# Patient Record
Sex: Male | Born: 1935 | Race: White | Hispanic: No | Smoking: Former smoker
Health system: Southern US, Community
[De-identification: ages and names within clinical notes are randomized; demographics above are authoritative.]

## PROBLEM LIST (undated history)

## (undated) DIAGNOSIS — B019 Varicella without complication: Secondary | ICD-10-CM

## (undated) DIAGNOSIS — E785 Hyperlipidemia, unspecified: Secondary | ICD-10-CM

## (undated) DIAGNOSIS — I350 Nonrheumatic aortic (valve) stenosis: Secondary | ICD-10-CM

## (undated) DIAGNOSIS — G479 Sleep disorder, unspecified: Secondary | ICD-10-CM

## (undated) DIAGNOSIS — E079 Disorder of thyroid, unspecified: Secondary | ICD-10-CM

## (undated) DIAGNOSIS — C4491 Basal cell carcinoma of skin, unspecified: Secondary | ICD-10-CM

## (undated) DIAGNOSIS — J189 Pneumonia, unspecified organism: Secondary | ICD-10-CM

## (undated) DIAGNOSIS — I639 Cerebral infarction, unspecified: Secondary | ICD-10-CM

## (undated) DIAGNOSIS — B269 Mumps without complication: Secondary | ICD-10-CM

## (undated) DIAGNOSIS — I38 Endocarditis, valve unspecified: Secondary | ICD-10-CM

## (undated) DIAGNOSIS — T8209XA Other mechanical complication of heart valve prosthesis, initial encounter: Secondary | ICD-10-CM

## (undated) DIAGNOSIS — A809 Acute poliomyelitis, unspecified: Secondary | ICD-10-CM

## (undated) DIAGNOSIS — N400 Enlarged prostate without lower urinary tract symptoms: Secondary | ICD-10-CM

## (undated) DIAGNOSIS — F32A Depression, unspecified: Secondary | ICD-10-CM

## (undated) DIAGNOSIS — Z952 Presence of prosthetic heart valve: Secondary | ICD-10-CM

## (undated) DIAGNOSIS — K5790 Diverticulosis of intestine, part unspecified, without perforation or abscess without bleeding: Secondary | ICD-10-CM

## (undated) DIAGNOSIS — M1990 Unspecified osteoarthritis, unspecified site: Secondary | ICD-10-CM

## (undated) DIAGNOSIS — I771 Stricture of artery: Secondary | ICD-10-CM

## (undated) DIAGNOSIS — I351 Nonrheumatic aortic (valve) insufficiency: Secondary | ICD-10-CM

## (undated) DIAGNOSIS — B059 Measles without complication: Secondary | ICD-10-CM

## (undated) DIAGNOSIS — F329 Major depressive disorder, single episode, unspecified: Secondary | ICD-10-CM

## (undated) DIAGNOSIS — I714 Abdominal aortic aneurysm, without rupture, unspecified: Secondary | ICD-10-CM

## (undated) DIAGNOSIS — I10 Essential (primary) hypertension: Secondary | ICD-10-CM

## (undated) DIAGNOSIS — Z8612 Personal history of poliomyelitis: Secondary | ICD-10-CM

## (undated) DIAGNOSIS — R0602 Shortness of breath: Secondary | ICD-10-CM

## (undated) DIAGNOSIS — I2510 Atherosclerotic heart disease of native coronary artery without angina pectoris: Secondary | ICD-10-CM

## (undated) DIAGNOSIS — I219 Acute myocardial infarction, unspecified: Secondary | ICD-10-CM

## (undated) HISTORY — PX: TONSILLECTOMY: SUR1361

## (undated) HISTORY — PX: CORONARY ANGIOPLASTY: SHX604

## (undated) HISTORY — PX: JOINT REPLACEMENT: SHX530

## (undated) HISTORY — DX: Mumps without complication: B26.9

## (undated) HISTORY — DX: Basal cell carcinoma of skin, unspecified: C44.91

## (undated) HISTORY — PX: APPENDECTOMY: SHX54

## (undated) HISTORY — DX: Nonrheumatic aortic (valve) stenosis: I35.0

## (undated) HISTORY — DX: Other mechanical complication of heart valve prosthesis, initial encounter: T82.09XA

## (undated) HISTORY — DX: Abdominal aortic aneurysm, without rupture, unspecified: I71.40

## (undated) HISTORY — DX: Benign prostatic hyperplasia without lower urinary tract symptoms: N40.0

## (undated) HISTORY — DX: Sleep disorder, unspecified: G47.9

## (undated) HISTORY — DX: Measles without complication: B05.9

## (undated) HISTORY — DX: Cerebral infarction, unspecified: I63.9

## (undated) HISTORY — DX: Atherosclerotic heart disease of native coronary artery without angina pectoris: I25.10

## (undated) HISTORY — DX: Personal history of poliomyelitis: Z86.12

## (undated) HISTORY — PX: CORONARY ARTERY BYPASS GRAFT: SHX141

## (undated) HISTORY — DX: Hyperlipidemia, unspecified: E78.5

## (undated) HISTORY — DX: Diverticulosis of intestine, part unspecified, without perforation or abscess without bleeding: K57.90

## (undated) HISTORY — DX: Nonrheumatic aortic (valve) insufficiency: I35.1

## (undated) HISTORY — PX: WISDOM TOOTH EXTRACTION: SHX21

## (undated) HISTORY — DX: Stricture of artery: I77.1

## (undated) HISTORY — PX: CARDIAC CATHETERIZATION: SHX172

## (undated) HISTORY — DX: Varicella without complication: B01.9

## (undated) HISTORY — DX: Abdominal aortic aneurysm, without rupture: I71.4

## (undated) HISTORY — DX: Endocarditis, valve unspecified: I38

## (undated) HISTORY — DX: Acute poliomyelitis, unspecified: A80.9

## (undated) HISTORY — PX: CORONARY STENT PLACEMENT: SHX1402

## (undated) HISTORY — DX: Essential (primary) hypertension: I10

## (undated) HISTORY — DX: Disorder of thyroid, unspecified: E07.9

---

## 1997-11-01 ENCOUNTER — Other Ambulatory Visit: Admission: RE | Admit: 1997-11-01 | Discharge: 1997-11-01 | Payer: Self-pay | Admitting: Cardiology

## 1998-02-07 ENCOUNTER — Other Ambulatory Visit: Admission: RE | Admit: 1998-02-07 | Discharge: 1998-02-07 | Payer: Self-pay | Admitting: Cardiology

## 1998-11-17 ENCOUNTER — Ambulatory Visit (HOSPITAL_COMMUNITY): Admission: RE | Admit: 1998-11-17 | Discharge: 1998-11-17 | Payer: Self-pay | Admitting: Cardiology

## 2001-07-23 HISTORY — PX: AORTIC VALVE REPLACEMENT: SHX41

## 2001-11-27 ENCOUNTER — Emergency Department (HOSPITAL_COMMUNITY): Admission: EM | Admit: 2001-11-27 | Discharge: 2001-11-27 | Payer: Self-pay | Admitting: Emergency Medicine

## 2002-01-06 ENCOUNTER — Ambulatory Visit (HOSPITAL_COMMUNITY): Admission: RE | Admit: 2002-01-06 | Discharge: 2002-01-06 | Payer: Self-pay | Admitting: Cardiology

## 2002-01-19 ENCOUNTER — Encounter: Payer: Self-pay | Admitting: Surgery

## 2002-01-21 ENCOUNTER — Encounter: Payer: Self-pay | Admitting: Surgery

## 2002-01-21 ENCOUNTER — Inpatient Hospital Stay (HOSPITAL_COMMUNITY): Admission: RE | Admit: 2002-01-21 | Discharge: 2002-01-29 | Payer: Self-pay | Admitting: Surgery

## 2002-01-21 ENCOUNTER — Encounter (INDEPENDENT_AMBULATORY_CARE_PROVIDER_SITE_OTHER): Payer: Self-pay | Admitting: *Deleted

## 2002-01-22 ENCOUNTER — Encounter: Payer: Self-pay | Admitting: Surgery

## 2002-01-23 ENCOUNTER — Encounter: Payer: Self-pay | Admitting: Surgery

## 2002-03-03 ENCOUNTER — Encounter (HOSPITAL_COMMUNITY): Admission: RE | Admit: 2002-03-03 | Discharge: 2002-06-01 | Payer: Self-pay | Admitting: Cardiology

## 2002-07-23 HISTORY — PX: ABDOMINAL AORTIC ANEURYSM REPAIR: SUR1152

## 2003-03-10 ENCOUNTER — Emergency Department (HOSPITAL_COMMUNITY): Admission: EM | Admit: 2003-03-10 | Discharge: 2003-03-10 | Payer: Self-pay | Admitting: Emergency Medicine

## 2003-03-10 ENCOUNTER — Encounter: Payer: Self-pay | Admitting: Emergency Medicine

## 2003-03-11 ENCOUNTER — Encounter: Payer: Self-pay | Admitting: *Deleted

## 2003-03-12 ENCOUNTER — Ambulatory Visit (HOSPITAL_COMMUNITY): Admission: RE | Admit: 2003-03-12 | Discharge: 2003-03-12 | Payer: Self-pay | Admitting: *Deleted

## 2003-03-31 ENCOUNTER — Inpatient Hospital Stay (HOSPITAL_COMMUNITY): Admission: RE | Admit: 2003-03-31 | Discharge: 2003-04-02 | Payer: Self-pay | Admitting: *Deleted

## 2003-03-31 ENCOUNTER — Encounter: Payer: Self-pay | Admitting: *Deleted

## 2003-05-03 ENCOUNTER — Encounter: Admission: RE | Admit: 2003-05-03 | Discharge: 2003-05-03 | Payer: Self-pay | Admitting: *Deleted

## 2003-05-03 ENCOUNTER — Encounter: Payer: Self-pay | Admitting: *Deleted

## 2003-11-08 ENCOUNTER — Encounter: Admission: RE | Admit: 2003-11-08 | Discharge: 2003-11-08 | Payer: Self-pay | Admitting: *Deleted

## 2004-05-15 ENCOUNTER — Encounter: Admission: RE | Admit: 2004-05-15 | Discharge: 2004-05-15 | Payer: Self-pay | Admitting: *Deleted

## 2004-11-06 ENCOUNTER — Encounter: Admission: RE | Admit: 2004-11-06 | Discharge: 2004-11-06 | Payer: Self-pay | Admitting: *Deleted

## 2005-05-14 ENCOUNTER — Encounter: Admission: RE | Admit: 2005-05-14 | Discharge: 2005-05-14 | Payer: Self-pay | Admitting: *Deleted

## 2005-05-21 ENCOUNTER — Ambulatory Visit (HOSPITAL_COMMUNITY): Admission: RE | Admit: 2005-05-21 | Discharge: 2005-05-21 | Payer: Self-pay | Admitting: Gastroenterology

## 2006-03-28 ENCOUNTER — Ambulatory Visit (HOSPITAL_COMMUNITY): Admission: RE | Admit: 2006-03-28 | Discharge: 2006-03-28 | Payer: Self-pay | Admitting: Cardiology

## 2006-03-28 ENCOUNTER — Encounter: Payer: Self-pay | Admitting: Vascular Surgery

## 2006-05-16 ENCOUNTER — Encounter: Admission: RE | Admit: 2006-05-16 | Discharge: 2006-05-16 | Payer: Self-pay | Admitting: *Deleted

## 2008-01-15 ENCOUNTER — Ambulatory Visit: Payer: Self-pay | Admitting: *Deleted

## 2008-01-15 ENCOUNTER — Encounter: Admission: RE | Admit: 2008-01-15 | Discharge: 2008-01-15 | Payer: Self-pay | Admitting: *Deleted

## 2008-06-22 DIAGNOSIS — I639 Cerebral infarction, unspecified: Secondary | ICD-10-CM

## 2008-06-22 HISTORY — DX: Cerebral infarction, unspecified: I63.9

## 2008-07-02 ENCOUNTER — Inpatient Hospital Stay (HOSPITAL_COMMUNITY): Admission: EM | Admit: 2008-07-02 | Discharge: 2008-07-04 | Payer: Self-pay | Admitting: Emergency Medicine

## 2008-07-02 ENCOUNTER — Encounter: Payer: Self-pay | Admitting: Emergency Medicine

## 2008-07-02 ENCOUNTER — Ambulatory Visit: Payer: Self-pay | Admitting: Diagnostic Radiology

## 2008-07-03 ENCOUNTER — Encounter (INDEPENDENT_AMBULATORY_CARE_PROVIDER_SITE_OTHER): Payer: Self-pay | Admitting: Neurology

## 2008-07-05 ENCOUNTER — Ambulatory Visit (HOSPITAL_COMMUNITY): Admission: RE | Admit: 2008-07-05 | Discharge: 2008-07-05 | Payer: Self-pay | Admitting: Neurology

## 2008-07-05 ENCOUNTER — Encounter (INDEPENDENT_AMBULATORY_CARE_PROVIDER_SITE_OTHER): Payer: Self-pay | Admitting: Neurology

## 2008-08-17 ENCOUNTER — Ambulatory Visit: Payer: Self-pay | Admitting: Diagnostic Radiology

## 2008-08-17 ENCOUNTER — Emergency Department (HOSPITAL_BASED_OUTPATIENT_CLINIC_OR_DEPARTMENT_OTHER): Admission: EM | Admit: 2008-08-17 | Discharge: 2008-08-17 | Payer: Self-pay | Admitting: Emergency Medicine

## 2009-09-05 ENCOUNTER — Ambulatory Visit: Payer: Self-pay | Admitting: Surgery

## 2010-08-13 ENCOUNTER — Encounter: Payer: Self-pay | Admitting: *Deleted

## 2010-08-14 ENCOUNTER — Ambulatory Visit: Payer: Self-pay | Admitting: Cardiology

## 2010-08-25 ENCOUNTER — Ambulatory Visit: Admit: 2010-08-25 | Payer: Self-pay | Admitting: Surgery

## 2010-08-29 ENCOUNTER — Encounter (INDEPENDENT_AMBULATORY_CARE_PROVIDER_SITE_OTHER): Payer: Medicare Other

## 2010-08-29 DIAGNOSIS — I714 Abdominal aortic aneurysm, without rupture: Secondary | ICD-10-CM

## 2010-08-29 DIAGNOSIS — Z48812 Encounter for surgical aftercare following surgery on the circulatory system: Secondary | ICD-10-CM

## 2010-09-01 ENCOUNTER — Ambulatory Visit (INDEPENDENT_AMBULATORY_CARE_PROVIDER_SITE_OTHER): Payer: Medicare Other | Admitting: *Deleted

## 2010-09-01 DIAGNOSIS — I10 Essential (primary) hypertension: Secondary | ICD-10-CM

## 2010-09-01 DIAGNOSIS — I2510 Atherosclerotic heart disease of native coronary artery without angina pectoris: Secondary | ICD-10-CM

## 2010-09-01 DIAGNOSIS — Z954 Presence of other heart-valve replacement: Secondary | ICD-10-CM

## 2010-09-04 NOTE — Procedures (Unsigned)
VASCULAR LAB EXAM  INDICATION:  Followup AAA endograft placed 03/31/2003.  HISTORY: Diabetes:  No. Cardiac:  Yes. Hypertension:  Yes.  EXAM:  AAA sac size 3.35 cm AP, 3.57 cm transverse.  Previous sac size 09/05/2009 3.42 cm AP, 4.47 cm transverse.  IMPRESSION: 1. The aorta and endograft appear patent. 2. No significant change in size of aneurysmal sac throughout     endograft. 3. No evidence of endoleak was detected.  ___________________________________________ V. Charlena Cross, MD  LT/MEDQ  D:  08/29/2010  T:  08/29/2010  Job:  [id number]

## 2010-10-05 ENCOUNTER — Encounter: Payer: Self-pay | Admitting: Cardiology

## 2010-10-16 ENCOUNTER — Encounter: Payer: Self-pay | Admitting: Nurse Practitioner

## 2010-10-16 ENCOUNTER — Ambulatory Visit (INDEPENDENT_AMBULATORY_CARE_PROVIDER_SITE_OTHER): Payer: Medicare Other | Admitting: Nurse Practitioner

## 2010-10-16 VITALS — BP 150/80 | HR 72 | Wt 183.0 lb

## 2010-10-16 DIAGNOSIS — F439 Reaction to severe stress, unspecified: Secondary | ICD-10-CM | POA: Insufficient documentation

## 2010-10-16 DIAGNOSIS — I10 Essential (primary) hypertension: Secondary | ICD-10-CM | POA: Insufficient documentation

## 2010-10-16 DIAGNOSIS — Z952 Presence of prosthetic heart valve: Secondary | ICD-10-CM | POA: Insufficient documentation

## 2010-10-16 DIAGNOSIS — I2510 Atherosclerotic heart disease of native coronary artery without angina pectoris: Secondary | ICD-10-CM | POA: Insufficient documentation

## 2010-10-16 DIAGNOSIS — Z954 Presence of other heart-valve replacement: Secondary | ICD-10-CM

## 2010-10-16 DIAGNOSIS — E785 Hyperlipidemia, unspecified: Secondary | ICD-10-CM

## 2010-10-16 NOTE — Assessment & Plan Note (Signed)
He is getting additional help at home to help with his wife. I encouraged him to follow up with his PCP regarding her neurologic care.

## 2010-10-16 NOTE — Assessment & Plan Note (Signed)
Blood pressure at home shows satisfactory control. We will continue with his current medicines. He will continue to monitor at home. I will see him back in 4 months.

## 2010-10-16 NOTE — Assessment & Plan Note (Signed)
Last echo was in December of 2009. His EF was normal. His prosthetic AV was functioning normally.  Will continue to monitor.

## 2010-10-16 NOTE — Progress Notes (Signed)
History of Present Illness: [name] [name] is seen today for a follow up visit. He is seen for Dr. Deborah Chalk. He is doing well. He is tolerating his medicines. He brings in his blood pressure diary from home. His readings are satisfactory. He remains under a lot of stress in caring for his wife who has dementia. He is not having chest pain. He is not lightheaded, dizzy or short of breath.   Current Outpatient Prescriptions on File Prior to Visit  Medication Sig Dispense Refill  . aspirin 325 MG tablet Take 325 mg by mouth daily.        Marland Kitchen atorvastatin (LIPITOR) 80 MG tablet Take 80 mg by mouth daily.        . finasteride (PROSCAR) 5 MG tablet Take 5 mg by mouth daily.        Marland Kitchen levothyroxine (SYNTHROID, LEVOTHROID) 50 MCG tablet Take 50 mcg by mouth daily.        Marland Kitchen losartan (COZAAR) 50 MG tablet Take 50 mg by mouth daily.        Marland Kitchen PARoxetine (PAXIL) 20 MG tablet Take 20 mg by mouth every morning.          Allergies  Allergen Reactions  . Augmentin   . Morphine And Related Nausea And Vomiting  . Penicillins Hives    Past Medical History  Diagnosis Date  . CVA (cerebral infarction) 12/09  . Dementia   . CAD (coronary artery disease)     stents in LXC and has a totally occluded RCA  . HTN (hypertension)   . Hyperlipemia   . Right iliac artery stenosis   . BPH (benign prostatic hyperplasia)   . Aortic stenosis     s/p AVR in 2003  . Glaucoma   . Diverticulosis   . AAA (abdominal aortic aneurysm)     s/p endovascular repair 2004    Past Surgical History  Procedure Date  . Aortic valve replacement 2003    #4mm pericardial valve   . Abdominal aortic aneurysm repair 2004    s/p endovascular repiar of AAA in 2004    History  Smoking status  . Former Smoker  . Types: Cigarettes  . Quit date: 10/04/1980  Smokeless tobacco  . Not on file    History  Alcohol Use  . Yes    SOCIAL ALCOHOL    Family History  Problem Relation Age of Onset  . Colon cancer Mother   . Aneurysm  Father   . Heart attack Brother     Review of Systems: The review of systems is as above. All other systems were reviewed and are negative.  Physical Exam: BP 150/80  Pulse 72  Wt 183 lb (83.008 kg) He is very pleasant and in no acute distress. Skin is warm and dry. Color is normal. HEENT is unremarkable. Lungs are clear. Cardiac exam shows a regular rate and rhythm. He has a soft outflow murmur that is grade 2. It radiates to the neck. Abdomen is obese. Extremities are without edema. Gait and ROM are intact. He has no gross neurologic deficits.   ECG:  N/A  Assessment / Plan:

## 2010-10-16 NOTE — Patient Instructions (Signed)
Stay on your current meds. I will see you back in 4 months. Keep track of your blood pressures at home and keep a diary.

## 2010-10-16 NOTE — Assessment & Plan Note (Signed)
He is currently without symptoms. His last stress test was in January of 2011. We will continue to manage medically.

## 2010-12-01 ENCOUNTER — Telehealth: Payer: Self-pay | Admitting: Cardiology

## 2010-12-01 NOTE — Telephone Encounter (Signed)
WANTS TO TALK WITH LORI REGARDING HIMSELF DOB: 12-24-1922 AND WIFE - CONSTANCE- DOB: 08/09/1923  REGARDING PROIVDER - BRIAN CRENSHAW. THE LETTER THEY RECEIVED. WANTS TO HEAR FROM LORI THAT THEY ARE TO SEE DR. CRENSHAW

## 2010-12-01 NOTE — Telephone Encounter (Signed)
Pt would like to see Norma Fredrickson NP at next office visit.  RN set pt up to see Lawson Fiscal.  Pt notified.

## 2010-12-05 NOTE — Discharge Summary (Signed)
NAMESAMY, RYNER                ACCOUNT NO.:  [id number]   MEDICAL RECORD NO.:  [id number]          PATIENT TYPE:  INP   LOCATION:  3110                         FACILITY:  [facility]   PHYSICIAN:  Pramod P. Pearlean Brownie, MD    DATE OF BIRTH:  May 01, 1923   DATE OF ADMISSION:  07/02/2008  DATE OF DISCHARGE:  07/04/2008                               DISCHARGE SUMMARY   ADMISSION DIAGNOSIS:  Code stroke.   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   HOSPITAL COURSE:  Mr. Onorato is a 77 year old Caucasian gentleman, who  developed sudden onset of speech and language difficulties around 4:30  p.m. on the day of admission.  He presented to the emergency room at  [facility] where he was noted having right lower facial  droop and significant expressive aphasia by ER physician Dr. Freida Busman.  Code stroke was called and after telephonically discussed the case, the  patient made eligible to criteria for t-PA, advised that to be given and  the patient was transferred to [facility].  However, Dr. Freida Busman called me  back in 10 minutes and the patient's symptoms have improved markedly and  hence code stroke was cancelled.  CT scan of the head was obtained which  showed no acute abnormality.  The patient was transferred to [facility] and when I evaluated the patient, the patient still had mild  recurrence of his symptoms with slurred speech and some word-finding  difficulties.  His NIH stroke scale was 2.  He was still within the 3-  hour time window and hence we gave him full dose IV TPA after ensuring  that he met inclusion of infusion criteria.  The patient tolerated  infusion well.  He was admitted to the intensive care unit and his blood  pressure was tightly controlled.  Admission labs were unremarkable.  Cardiac enzymes were negative.  Lipid profile showed total cholesterol  165,  LDL was borderline at 104.  The patient's hemoglobin A1c and  homocystine were normal, HDL was 39.  MRI of the brain showed a small  infarct involving the splenium corpus callosum.  MRA of the brain and  neck showed no significant intra or extracranial large vessel stenosis.  A 2-D echo was not done because it was a weekend, but arrangements were  made to be done on July 05, 2008, at 11:00 a.m. as an outpatient.  The patient had previously been on aspirin, this was changed to Plavix  for secondary stroke prevention.  The patient's speech and word-finding  difficulties improved significantly.  Next day at the time of discharge,  he had no focal neurological deficits.  He had been seen by physical and  occupational therapy, and was felt to have no therapy needs after  discharge.  I had a long discussion with the patient and multiple family  members including his wife and discussed role of secondary stroke  prevention.  I also discussed with them briefly the IRIS stroke  prevention study.  The patient and family expressed interest.  We gave  them written information to review at home.  They were instructed to  call me, if they want to  participate.  At the time of discharge, the instructions and the  medications were as follows:  Plavix 75 mg a day, Lipitor 80 mg a day,  Diovan 80 mg a day, Proscar 5 mg a day, and Paxil 10 mg a day.  The  patient was advised to follow up with Dr. Pearlean Brownie in the office in 2  months and with his primary physician Dr. Beverely Low in 2 weeks.           ______________________________  Sunny Schlein. Pearlean Brownie, MD     PPS/MEDQ  D:  07/04/2008  T:  07/05/2008  Job:  [id number]   cc:   Beverely Low, MD  Colleen Can. Deborah Chalk, M.D.

## 2010-12-05 NOTE — H&P (Signed)
[name], [name]                ACCOUNT NO.:  [id number]   MEDICAL RECORD NO.:  [id number]          PATIENT TYPE:  INP   LOCATION:  3110                         FACILITY:  [facility]   PHYSICIAN:  Pramod P. Pearlean Brownie, MD    DATE OF BIRTH:  11-18-1922   DATE OF ADMISSION:  07/02/2008  DATE OF DISCHARGE:                              HISTORY & PHYSICAL   REFERRING PHYSICIAN:  Lorre Nick, MD   REASON FOR REFERRAL:  Code stroke.   HISTORY OF PRESENT ILLNESS:  [name] is an 77 year old Caucasian male  who developed sudden onset of speech and language difficulties at about  4:30 p.m. today.  The patient presented to the emergency room at [facility] within an hour and had significant expressive aphasia and right  lower facial droop as per his wife as well as Dr. Freida Busman.  Dr. Freida Busman  called a code stroke and spoke to me over the phone, but after the  patient returning from the CT scan, the patient had shown significant  improvement and Dr. Freida Busman cancelled the code stroke but transferred the  patient to [facility] Emergency Room.  When the patient arrived here, I  was called and when examined the patient, the patient was still having  some expressive language difficulties and slurred speech.  I had a long  discussion with the patient as well as his wife and multiple family  members, and since the patient was still within 3-hour window, I  discussed the risk and benefit of IV thrombolysis with t-PA and decided  to go ahead and give him t-PA.  He has apparently no prior history of  stroke, TIA, seizures, or significant neurological problems.   Past medical history is significant for cardiac disease with cardiac  bypass graft surgery, angioplasty, stenting.   The patient's home medications are not known at the present time.   SOCIAL HISTORY:  The patient is married, lives with his wife in  [place].  He is retired.  He is independent activities of daily  living.   PHYSICAL EXAMINATION:   GENERAL:  A pleasant, elderly Caucasian male who  is at present not in distress.  VITAL SIGNS:  He is afebrile, pulse rate is 74 per minute and regular,  blood pressure 172/64, respiratory rate is 20 per minute.  Distal pulses  are well felt.  HEAD:  Nontraumatic.  NECK:  Supple.  There is no bruit.  ENT:  Unremarkable.  CARDIAC:  No murmur, rub, or gallop.  LUNGS:  Clear to auscultation.  He has a surgical scar from CABG on his  chest.  NEUROLOGIC:  The patient is awake, alert, cooperative.  He has mild  expressive aphasia.  He struggles with certain words and has word-  finding difficulties.  He has slurred speech as well.  He has good  comprehension.  He is able to name.  He has mild difficulty with  repetition.  He has good comprehension.  Eye movements are full range.  Visual acuity is adequate.  Face is symmetric.  Tongue is midline.  Motor system exam reveals no  upper extremity drift.  He has symmetric  strength, tone, reflexes.  His gait was not tested.   On the NIH Stroke Scale, he scored 2 points, one for aphasia and one for  slurred speech.   DATA REVIEWED:  Noncontrast CAT scan of the head done today was  personally reviewed, shows mild generalized atrophy.  There is no acute  abnormality, hemorrhage, or early signs of large infarct noted.  Admission labs are pending at this time.   IMPRESSION:  This is an 82-year gentleman with sudden onset of  expressive aphasia, slurred speech, and facial droop likely due to left  middle cerebral artery branch infarction.  The patient has had subtotal  improvement but still has significant word-finding difficulties and  slurred speech and has presented within the 3-hour time window.  I think  the patient qualifies for thrombolysis with tissue plasminogen  activator.  I have discussed the risk, benefit of intravenous tissue  plasminogen activator including 7% risk of intracerebral hemorrhage with  the patient, his wife, and multiple  family members and of thrombolysing  with full-dose intravenous tissue plasminogen activator 0.9 mg/kg.  The  patient will be admitted to the intensive care unit.  His blood pressure  will be tightly observed and kept under control.  We will arrange MRI  scan of the brain with MRA of the brain, carotid Dopplers, 2-D echo,  fasting lipid profile, hemoglobin A1c, and homocysteine.  Speech therapy  consult for language.  Continue aspirin after 24 hours of tissue  plasminogen activator.   I spent 1 hour of critical care time evaluating and managing this  patient and arranging followup care and discussing with family members  and ER physicians.           ______________________________  Sunny Schlein. Pearlean Brownie, MD     PPS/MEDQ  D:  07/02/2008  T:  07/03/2008  Job:  [id number]

## 2010-12-05 NOTE — Procedures (Signed)
ENDOVASCULAR STENT GRAFT EXAM   INDICATION:  Followup of an endostent.   HISTORY:  AAA repair, 03/31/03, by Dr. Madilyn Fireman.                           DUPLEX EVALUATION   AAA Sac Size:                 3.42 CM AP            4.47 CM TRV  Previous Sac Size:            4.48 CM AP            4.1 CM TRV  Evidence of an endoleak?                            No   Velocity Criteria:  Proximal Aorta                94 cm/sec  Proximal Stent Graft          44 cm/sec  Main Body Stent Graft-Mid     45 cm/sec  Right Limb-Proximal           25 cm/sec  Right Limb-Distal             28 cm/sec  Left Limb-Proximal            24 cm/sec  Left Limb-Distal              71 cm/sec  Patent Renal Arteries?        Yes   IMPRESSION:  1. Patent endostent with no evidence of stenosis or leak.  2. Ankle brachial indices within normal limits.   ___________________________________________  V. Charlena Cross, MD   CJ/MEDQ  D:  09/05/2009  T:  09/05/2009  Job:  [id number]

## 2010-12-05 NOTE — Procedures (Signed)
ENDOVASCULAR STENT GRAFT EXAM   INDICATION:  Followup AAA endostent.   HISTORY:  Abdominal aortic aneurysm endostent, 03/31/03 by Dr. Madilyn Fireman.                           DUPLEX EVALUATION   AAA Sac Size:                 4.48 CM AP            4.13 CM TRV  Previous Sac Size:            05/16/06 (CT), 4.6 CM AP  05/16/06 (CT), 4.1 CM TRV  Evidence of an endoleak?      No                    No   Velocity Criteria:  Proximal Aorta                63 cm/sec  Proximal Stent Graft          cm/sec  Main Body Stent Graft-Mid     59 cm/sec  Right Limb-Proximal           42 cm/sec  Right Limb-Distal             76 cm/sec  Left Limb-Proximal            59 cm/sec  Left Limb-Distal              66 cm/sec  Patent Renal Arteries?        Yes                   Yes   IMPRESSION:  1. Patent abdominal aortic aneurysm endostent with stable residual      abdominal aortic aneurysm sac size.  2. Some areas technically difficult to see due to body habitus and      patient not being n.p.o.   ___________________________________________  P. Liliane Bade, M.D.   AS/MEDQ  D:  01/15/2008  T:  01/15/2008  Job:  [id number]

## 2010-12-05 NOTE — Assessment & Plan Note (Signed)
OFFICE VISIT   [name] [name], [name] [name]  DOB:  1923/02/28                                       09/05/2009  [id number]#:[id number]   The patient is an 77 year old gentleman who has undergone endovascular  repair of abdominal aortic aneurysm by Dr. Madilyn Fireman in 2004 using an AneuRx  stent graft.  He comes back in today for follow-up.  His most recent  evaluation was in June 2009.  He comes in today without complaints.  He  does not have any abdominal pain.   PAST MEDICAL HISTORY:  Hypertension, hypercholesterolemia, history of  MI, abdominal aortic aneurysm, coronary artery disease.   SOCIAL HISTORY:  He is married with two children.  He is retired.  Does  not smoke, quit 30 years ago, occasional alcohol drinker per day.   REVIEW OF SYSTEMS:  Positive for stroke, recent change in eyesight and  hearing.  All other review of systems are negative as documented in the  encounter form.   PHYSICAL EXAMINATION:  VITAL SIGNS:  Heart rate 69, blood pressure  145/78, O2 saturations 100%, temperature is 98.2.  GENERAL:  Well-appearing, no distress.  HEENT:  Within normal limits.  Respirations are nonlabored.  ABDOMEN:  Soft, nontender.  No pulsatile mass.  EXTREMITIES:  Warm, well perfused.  NEUROLOGY:  Intact.  SKIN:  Without rash.   DIAGNOSTIC STUDIES:  Abdominal ultrasound was performed today.  This  reveals his aneurysm sac of 3.4 x 4.4 cm.  This is compared to his prior  scan of 4.5 x 4.1.   Ankle brachial indices are 1 bilaterally.   ASSESSMENT/PLAN:  Status post endovascular stent graft.  The patient is  stable at this time.  We will continue with yearly ultrasound  surveillance.     Jorge Ny, MD  Electronically Signed   VWB/MEDQ  D:  09/05/2009  T:  09/06/2009  Job:  [id number]   cc:   Colleen Can. Deborah Chalk, M.D.  Courtney Paris, M.D.

## 2010-12-08 NOTE — Cardiovascular Report (Signed)
[place]  Patient:    [name] [name], [name] [name] Visit Number: [id number] MRN: [id number]          Service Type: CAT Location: [facility] 2852 01 Attending Physician:  Eleanora Neighbor Dictated by:   Colleen Can Deborah Chalk, M.D. Proc. Date: 01/06/02 Admit Date:  01/06/2002 Discharge Date: 01/06/2002                          Cardiac Catheterization  DATE OF BIRTH:  July 13, 1923  INDICATIONS:  Mr. Marasigan is referred for evaluation of aortic stenosis.  PROCEDURE:  Left and right heart catheterization with selective coronary angiography.  TYPE AND SITE OF ENTRY:  Percutaneous left femoral artery, percutaneous left femoral vein.  CATHETERS:  6 French 4 curved Judkins right and left coronary catheters, 6 French pigtail ventriculography catheter, 7 French Swan-Ganz thermodilution catheter.  CONTRAST:  Omnipaque.  MEDICATIONS:  Prior to the procedure; Valium 10 mg p.o.  During the procedure; Versed 2 mg IV.  COMMENTS:  The patient tolerated the procedure well.  HEMODYNAMIC DATA:  Right atrial pressure mean of 7, RV 30/8, pulmonary artery 31/14 and mean of 22.  Pulmonary capillary wedge pressure with mean of 11. Aortic pressure 129/68, left ventricular pressure 167/15.  Cardiac output by Fick was 4.1 with cardiac output by thermodilution of 4.0 with index of 2.11.  Mean aortic valve gradient was 31 mmHg with aortic valve area of 0.75 cmsq. Aortic saturation 92%, PA saturation of 66%.  ANGIOGRAPHIC DATA: 1. Left main coronary artery is normal. 2. Left anterior descending is a long vessel that crosses the apex and gives    collaterals to the distal right coronary artery.  There are two diagonal    vessels.  They are basically free of significant disease. 3. Left circumflex is a reasonably large vessel.  There is a stented area    in the midportion of the vessel and it is widely patent.  There is    excellent distal flow.  There is mild proximal  narrowing of less than    20%. 4. Right coronary artery is a small vessel.  There is a right ventricular    branch of moderate size that is free of significant disease.  The posterior    descending branch is totally occluded and fills with some scanty flow from    the collaterals to the left system.  LEFT VENTRICULAR ANGIOGRAM:  Was performed in the RAO position.  Overall cardiac size and silhouette are normal.  There is inferior basilar hypokinesia, but the global ejection fraction estimated to be 55 to 60%. There is no mitral regurgitation.  OVERALL IMPRESSION: 1. Inferobasilar hypokinesia with well preserved global left ventricular    function. 2. Calcific aortic stenosis with severely reduced aortic valve area and    moderately severe gradient. 3. Totally occluded right coronary artery, patent stent in the left    circumflex, and minimal coronary atherosclerosis otherwise.  DISCUSSION:  In light of Mr. Kidd other medical problems and with severity of his aortic stenosis at this point in time, it is felt that he needs to proceed on with aortic valve replacement. Dictated by:   Colleen Can Deborah Chalk, M.D. Attending Physician:  Eleanora Neighbor DD:  01/06/02 TD:  01/07/02 Job: [id number]

## 2010-12-08 NOTE — Op Note (Signed)
[name] [name], [name] [name]                          ACCOUNT NO.:  [id number]   MEDICAL RECORD NO.:  [id number]                   PATIENT TYPE:  OIB   LOCATION:  2899                                 FACILITY:  [facility]   PHYSICIAN:  Balinda Quails, M.D.                 DATE OF BIRTH:  [birth year]   DATE OF PROCEDURE:  03/12/2003  DATE OF DISCHARGE:  03/12/2003                                 OPERATIVE REPORT   SURGEON:  Balinda Quails, M.D.   PREOPERATIVE DIAGNOSIS:  5.1 cm abdominal aortic aneurysm.   POSTOPERATIVE DIAGNOSIS:  5.1 cm abdominal aortic aneurysm.   PROCEDURE:  Abdominal aortogram with pelvic run off arteriography.   ACCESS:  Right common femoral artery 5 French sheath.   CONTRAST:  120 mL Visipaque.   COMPLICATIONS:  None apparent.   CLINICAL NOTE:  [name] is a 77 year old male who presented to the  emergency department two days ago with back pain.  CT scan revealed a 5.1 cm  infrarenal abdominal aortic aneurysm.  There was no evidence of leak.  The  patient was stable without significant tenderness over the aneurysm and  discharge.  He was brought back to the cath lab at this time for diagnostic  arteriography.   PROCEDURE NOTE:  The patient was brought to the cath lab in stable  condition.  He was placed in supine position.  Both groins were prepped and  draped in a sterile fashion.  The skin and subcutaneous tissue of the right  groin was instilled with 1% Xylocaine.  A needle was passed in the right  common femoral artery.  A 3.5 J-wire was passed through the needle.  The J-  wire, however, could not be passed into the abdominal aorta.  The needle was  removed and the 5 French sheath advanced over the guide-wire.  The dilator  removed and sheath flushed with heparin saline solution.  The J-wire was  removed and the Magic Torque guide-wire advanced through the sheath.  This  was advanced into the abdominal aorta.  A graduated pigtail catheter was  advanced over  the guide-wire to the mid abdominal aorta.  A standard mid  abdominal AP aortogram was obtained.  This revealed single widely patent  bilateral renal arteries.  A 3-4 cm infrarenal aortic neck.  Dilatation of  the infrarenal aorta consistent with a triple A was present.  There was  plaque at the aortic bifurcation with a high grade stenosis of the right  common iliac artery origin.   A lateral aortogram was obtained.  This revealed brisk flow through the  superior mesenteric artery.  Mild anterior angulation approximately 15  degrees.   A pelvic arteriogram obtained in the AP and bilateral oblique projections.  There was high grade stenosis at the origin of the right common iliac  artery.  The internal and external iliac arteries were patent bilaterally.  The patient tolerated the procedure well.  The guide-wire reinserted,  pigtail catheter removed.  The right femoral sheath was removed.  No  apparent complications.    DISPOSITION:  The patient will be discharged today.  No significant further  back pain.  CT scan will  be reviewed for potential stent graft placement.                                               Balinda Quails, M.D.    PGH/MEDQ  D:  03/12/2003  T:  03/12/2003  Job:  [id number]   cc:   Colleen Can. Deborah Chalk, M.D.  Fax: [phone]

## 2010-12-08 NOTE — H&P (Signed)
[place]. [facility]  Patient:    [name], [name] Visit Number: [id number] MRN: [id number]          Service Type: EMS Location: ED Attending Physician:  Shelba Flake Dictated by:   Jennet Maduro Earl Gala, R.N., A.N.P. Admit Date:  11/27/2001 Discharge Date: 11/27/2001   CC:         Redmond Baseman, M.D.  James L. Randa Evens, M.D.   History and Physical  CHIEF COMPLAINT:  None.  HISTORY OF PRESENT ILLNESS:  [name] [name] is a very pleasant, 77 year old, white male who has known atherosclerotic coronary artery disease.  He has had known aortic stenosis as well and has remained asymptomatic.  He has had no symptoms of chest pain, shortness of breath, marked fatigue, or congestive heart failure.  He has had a recent episode of diverticulitis and possible septicemia.  There were concerns related to his underlying valvular condition. He also needs to have an upcoming colonoscopy.  He was referred on for a repeat 2-D echocardiogram which was performed earlier this month.  This shows concentric LVH with a normal global ejection fraction.  There is calcified aortic stenosis of a severe nature with diastolic dysfunction.  This did represent progression of his aortic valvular disease.  His mean gradient has increased approximately 6-8 mm.  The peak gradient is 56 mm with a mean gradient of 38.  The aortic valve area is 0.8 sq cm.  He is now referred for left and right heart catheterization with anticipation for need for aortic valve replacement surgery.  PAST MEDICAL HISTORY: 1. Atherosclerotic cardiovascular disease.  He has had previous inferior    myocardial infarction in June of 1997 with angioplasty to the right    coronary at that time.  He had a stent placed to the left circumflex in    1997.  His last treadmill was in 1999. 2. Known right iliac stenosis. 3. Aortic stenosis. 4. Glaucoma. 5. Hypercholesterolemia. 6. Recent bout of diverticulitis. 7.  Hypercholesterolemia.  ALLERGIES:  PENICILLIN and AUGMENTIN.  CURRENT MEDICATIONS: 1. Welchol three tablets twice a day. 2. Xalantan eyedrops daily. 3. Lipitor 80 mg a day. 4. Paxil 20 mg a day. 5. Imdur 30 mg a day. 6. Vitamin E daily.  FAMILY HISTORY:  His mother died at 74 due to colon cancer.  His father died at 81 due to an aneurysm, type was unknown.  SOCIAL HISTORY:  He is married.  He has two children.  He is a retired Administrator, sports.  He quit smoking almost 20 years ago.  He lives at home with his wife.  REVIEW OF SYSTEMS:  Basically as noted above and is otherwise unremarkable.  PHYSICAL EXAMINATION:  He is a very pleasant, talkative, white male who is somewhat hard of hearing.  He is in no acute distress.  VITAL SIGNS:  Blood pressure 110/70 sitting and standing, heart rate 72 and regular, respirations 18, and he is afebrile.  WEIGHT:  177 pounds.  SKIN:  Warm and dry.  Color is unremarkable.  LUNGS:  Clear.  HEART:  Regular rhythm.  He has a harsh grade 3/6 systolic ejection murmur.  ABDOMEN:  Soft.  Positive bowel sounds.  Nontender.  EXTREMITIES:  He has no peripheral edema.  NEUROLOGIC:  Intact.  LABORATORY DATA:  Pending.  IMPRESSION: 1. Progressive aortic stenosis, currently asymptomatic. 2. Known coronary disease, currently stable clinically. 3. Known peripheral vascular disease. 4. Hypercholesterolemia.  PLAN:  Will proceed on with elective right and  left heart catheterization. The procedure has been discussed in full detail and he is willing to proceed on. Dictated by:   Jennet Maduro Earl Gala, R.N., A.N.P. Attending Physician:  Shelba Flake DD:  01/01/02 TD:  01/03/02 Job: [id number]

## 2010-12-08 NOTE — Op Note (Signed)
[place]. [facility]  Patient:    HIEU, HERMS Visit Number: [id number] MRN: [id number]          Service Type: SUR Location: 2000 2039 01 Attending Physician:  Cleatrice Burke Dictated by:   Alleen Borne, M.D. Proc. Date: 01/21/02 Admit Date:  01/21/2002   CC:         Colleen Can. Deborah Chalk, M.D.  [facility]   Operative Report  PREOPERATIVE DIAGNOSES: 1. Severe aortic stenosis. 2. Single-vessel coronary artery disease.  POSTOPERATIVE DIAGNOSES: 1. Severe aortic stenosis. 2. Single-vessel coronary artery disease.  PROCEDURES: 1. Median sternotomy. 2. Extracorporeal circulation. 3. Aortic valve replacement using a 21 mm bovine pericardial valve.  SURGEON:  Alleen Borne, M.D.  ASSISTANT:  Wandalee Ferdinand, R.N.F.A.  ANESTHESIA:  General endotracheal.  CLINICAL HISTORY:  This patient is a 77 year old white male with a history of atherosclerotic coronary disease, status post inferior MI in June 1997, with angioplasty of the right coronary artery.  He subsequently had a stent placed in the left circumflex in 1997.  He has a known history of aortic stenosis that has remained asymptomatic until recently.  Over the past few months he has had decreased energy level and exertional shortness of breath.  An echocardiogram in May showed concentric left ventricular hypertrophy with a normal ejection fraction.  There was calcified aortic stenosis with a peak gradient of 55 and a mean gradient of 38.  Aortic valve area was 0.8 sq. cm with evidence of diastolic dysfunction.  Cardiac catheterization on January 06, 2002, showed normal right and left heart pressures.  The aortic valve gradient was 31 mmHg mean with an aortic valve area of 0.75 sq cm.  Coronary angiography showed the circumflex stent was widely patent.  The right coronary artery was a small vessel that was occluded after a large acute marginal branch.  The posterior  descending artery filled faintly by collaterals from the left but appeared to be a small vessel.  Left ventricular ejection fraction was 55-60% with no mitral regurgitation, with inferobasilar hypokinesis.  After review of the angiogram and echocardiogram and examination of the patient, it was felt that aortic valve replacement and possible coronary bypass to the posterior descending artery was the best treatment for this patient.  I discussed the operative procedure with the patient and his wife, including alternatives, benefits, and risks, including bleeding, blood transfusion, infection, stroke, myocardial infarction, and death.  I also discussed the possibility that the posterior descending artery may not be graftable.  We discussed the alternatives for valve replacement, including mechanical versus tissue valve.  I felt that at age 55 with coronary disease that the best choice for him would be a bovine pericardial valve.  He understood this and agreed to proceed with that type of valve.  DESCRIPTION OF PROCEDURE:  The patient was taken to the operating room and placed on the table in the supine position.  After induction of general endotracheal anesthesia, a Foley catheter was placed in the bladder using sterile technique.  Then the chest, abdomen, and both lower extremities were prepped and draped in the usual sterile manner.  A transesophageal echocardiogram was performed by anesthesiology and showed a severely calcified aortic valve with decreased leaflet function and a small central opening. Left ventricular function was well-preserved.  There was no significant mitral regurgitation.  Then the chest was opened through a median sternotomy incision and the pericardium opened in the midline.  Examination of the heart  showed good ventricular contractility.  The ascending aorta had some plaque present in it adjacent to the innominate artery as well as in the aortic root.  I  examined the posterior descending artery, and it was a small, diffusely diseased vessel that was not suitable for grafting.  Then the patient was heparinized and when an adequate activated clotting time was achieved, the distal ascending aorta was cannulated using a 20 French aortic cannula for arterial inflow.  Venous outflow was achieved using a two-stage venous cannula through the right atrial appendage.  An antegrade cardioplegia and vent cannula was inserted in the aortic root.  A retrograde cardioplegia cannula was inserted through the right atrium into the coronary sinus.  A left ventricular vent was inserted through the right superior pulmonary vein.  The patient was placed on cardiopulmonary bypass.  The aorta was then crossclamped and 500 cc of cold blood antegrade cardioplegia was administered in the aortic root with quick arrest of the heart.  Systemic hypothermia to 20 degrees Centigrade and topical hypothermia with iced saline was used.  A temperature probe was placed in the septum and an insulating pad in the pericardium.  Additional doses of retrograde cardioplegia were given at about 20 to 30-minute intervals to maintain myocardial temperature around 10 degrees Centigrade.  Then the aorta was _____ through a transverse incision about 1.5 cm above the takeoff of the right coronary artery.  Examination of the native valve showed there was a three-leaflet valve with severe calcification of the leaflets, which were immobile.  There was some fusion at the commissures.  The right and left coronary ostia were identified and were in normal position.  The aortic valve was excised.  The annulus was decalcified with rongeurs.  Care was taken to remove all particulate debris.  The aortic root and left ventricle were irrigated with a large volume of iced saline.  The annulus was sized and a 21 mm pericardial valve was chosen.  Then a series of pledgeted 2-0 Ethibond  horizontal  mattress sutures were placed with the pledgets in a subannular position.  The sutures were placed through the sewing ring on the valve and the valve lowered into place.  The sutures were tied sequentially.  The valve seated nicely.  Then the patient was rewarmed to 37 degrees Centigrade.  The aortotomy was closed in two layers using continuous 4-0 Prolene suture and Teflon felt strips due to the thin and fragile aortic wall in this patient. Then the left side of the heart was de-aired.  The head was placed in Trendelenburg position and the crossclamp removed with a time of 93 minutes. There was spontaneous return of ventricular fibrillation, and the patient was defibrillated into sinus rhythm.  Two temporary right ventricular and right atrial pacing wires were placed and brought out through the skin.  When the patient had rewarmed to 37 degrees Centigrade, he was weaned from cardiopulmonary bypass on low-dose dopamine. Total bypass time was 123 minutes.  Cardiac function appeared excellent with a cardiac output of 5 L/min.  Transesophageal echocardiogram was performed and showed a normal functioning pericardial valve prosthesis.  There was no significant mitral regurgitation.  Left ventricular function appeared well- preserved.  Protamine was given.  The aortic and venous cannulas were removed without difficulty.  The patient did have some obvious coagulopathy, which continued oozing from all raw surfaces after the protamine was given.  We confirmed that the activated clotting time was normalized and the heparin control was 0.  Then the patient was given a unit of platelets for a platelet count of 96,000.  This improved hemostasis, but it was not complete.  Then two chest tubes were placed with two in the posterior pericardium and one in the anterior mediastinum.  The pericardium was reapproximated over the heart.  The sternum was closed with #6 stainless steel wires.  The fascia was closed  with continuous #1 Vicryl suture.  Subcutaneous tissue was closed with continuous 2-0 Vicryl and the skin with 3-0 Vicryl subcuticular closure.  The sponge, needle, and instrument counts were correct according to the scrub nurse.  Dry sterile dressings were applied over the incisions and around the chest tubes, which were hooked to Pleuravac suction.  The patient remained hemodynamically stable and was transported to the SICU in guarded but stable condition. Dictated by:   Alleen Borne, M.D. Attending Physician:  Cleatrice Burke DD:  01/21/02 TD:  01/24/02 Job: [id number]

## 2010-12-08 NOTE — Discharge Summary (Signed)
NAMEANASTACIO, BUA                          ACCOUNT NO.:  [id number]   MEDICAL RECORD NO.:  [id number]                   PATIENT TYPE:  INP   LOCATION:  2010                                 FACILITY:  [facility]   PHYSICIAN:  Balinda Quails, M.D.                 DATE OF BIRTH:  1923-03-01   DATE OF ADMISSION:  03/31/2003  DATE OF DISCHARGE:  04/02/2003                                 DISCHARGE SUMMARY   ADMITTING DIAGNOSIS:  A 5.1 cm abdominal aortic aneurysm.   PAST MEDICAL HISTORY:  1. Coronary artery disease, status post MI and PTCA of the right coronary     artery in 1997, as well as a stent in the left circumflex at the same     time.  2. Aortic valve stenosis, status post aortic valve replacement in July 2003,     by Dr. Laneta Simmers.  3. History of right iliac artery stenosis.  4. Hypercholesterolemia.  5. Glaucoma.  6. History of diverticulosis.  7. Hiatal hernia.  8. Herniated lumbar disk.  9. BPH.  10.      History of cholelithiasis.  11.      Depression.  12.      Other surgical history also includes appendectomy in 1949, and     excision of kidney stone 1977.   ALLERGIES:  AUGMENTIN and PENICILLIN, these cause rash, MORPHINE causes GI  upset, and BETA-BLOCKERS cause severe bradycardia.   DISCHARGE DIAGNOSES:  A 5.1 cm abdominal aortic aneurysm, status post  aneurysm repair with AneuRx stent graft.   BRIEF HISTORY:  [name] [name] is a 77 year old Caucasian man.  He presented to  the emergency room at [facility] March 10, 2003, with complaints  of back pain.  CT scan revealed a 5.1 cm infrarenal aneurysm without  evidence of leak.  Dr. Madilyn Fireman was consulted and scheduled him for an  abdominal aortogram on March 12, 2003.  This was performed and he followed  up with Dr. Madilyn Fireman at the [facility] office on March 22, 2003.  After review of  the CT scan as well as the angiogram, Dr. Madilyn Fireman recommended proceeding with  repair of his abdominal aneurysm with the stent graft  approach.  The  procedure risks and benefits were all discussed with [name] [name].  He agreed  with this plan.  He was scheduled as an elective admission.   HOSPITAL COURSE:  On March 31, 2003, [name] [name] was electively admitted to  [facility] under the care of Dr. Bud Face.  He underwent the  following surgical procedure:  1. Bilateral single balloon common iliac artery PTA with Panaflex [id number]     x30 seconds.  2. AneuRx AAA repair on the right 28 x 16 x 16.5, on the left 16 x 11.5.   He tolerated the procedure well, transferring in stable condition to the  PACU.  He awoke from  anesthesia neurologically intact.  This morning,  April 01, 2003, postoperative day #1, he reports feeling fairly well  although he has had some nausea today, he has adequate bowel sounds, his  abdomen is only slightly distended.  His bilateral groin incisions are  healing well, his feet remain well perfused.  He has been ambulating in the  hall, pain is reasonably controlled.  If [name] [name] continues to progress as  is expected he will be ready for discharge home tomorrow, April 02, 2003.   LABORATORY DATA:  On April 01, 2003, white blood cells 8.6, hemoglobin  11, hematocrit 35, platelets 169.  Chemistries included potassium 3.4,  sodium 27, BUN 15, creatinine 1.1, glucose 118.   CONDITION ON DISCHARGE:  Improved.   DISCHARGE MEDICATIONS:  He is instructed to resume his home medications:  1. Zetia 10 mg daily.  2. Lipitor 40 mg daily.  3. Diovan 80 mg daily.  4. Flomax 0.4 mg daily.  5. Proscar 5 mg daily.  6. Paxil 10 mg daily.  7. Xalatan 0.005% drops daily to both eyes.  8. Coated aspirin 325 mg daily.  9. For pain management he may have Tylox 1-2 p.o.q.4-6 h. p.r.n.   ACTIVITY:  He has been asked to refrain from any driving or any heavy  lifting at this time.   DIET:  Diet should continue to be a heart healthy diet.   WOUND CARE:  He is to shower daily starting  Saturday, April 03, 2003.  If his incisions are showing signs of infection or if he has a fever greater  than 101 degrees Fahrenheit, he is to call Dr. Madilyn Fireman' office.   FOLLOW UP:  Dr. Madilyn Fireman would like to see him at the [facility] office on Monday,  May 03, 2003, at 12:40 p.m.  Arrangements will be made for him to have a  CT scan of the abdomen and pelvis several days prior to that appointment.  He will be contacted with these details.      Toribio Harbour, N.P.                  Balinda Quails, M.D.    CTK/MEDQ  D:  04/01/2003  T:  04/02/2003  Job:  [id number]   cc:   Thelma Barge P. Modesto Charon, M.D.  [address]  [place]  [place] [address]  Fax: [phone]   Colleen Can. Deborah Chalk, M.D.  Fax: [phone]

## 2010-12-08 NOTE — Discharge Summary (Signed)
[place]. [facility]  Patient:    [name] [name], [name] [name] Visit Number: [id number] MRN: [id number]          Service Type: SUR Location: 2000 2039 01 Attending Physician:  Cleatrice Burke Dictated by:   Sherrie George, P.A. Admit Date:  01/21/2002 Disc. Date: 01/29/02   CC:         Rozanna Boer., M.D.  Colleen Can. Deborah Chalk, M.D.   Referring Physician Discharge [facility]  DATE OF BIRTH:  11-16-1922  ADMISSION DIAGNOSES: 1. Severe aortic stenosis with single-vessel coronary artery disease. 2. Status post myocardial infarction with percutaneous transluminal coronary    angioplasty of the right coronary artery; prior left circumflex    percutaneous transluminal coronary angioplasty/stent placement. 3. Right iliac stenosis. 4. Hypercholesterolemia. 5. Diverticulosis. 6. Glaucoma.  DISCHARGE DIAGNOSES: 1. Severe aortic stenosis with single-vessel coronary artery disease. 2. Status post myocardial infarction with percutaneous transluminal coronary    angioplasty of the right coronary artery, prior left circumflex    percutaneous transluminal coronary angioplasty/stent placement. 3. Right iliac stenosis. 4. Hypercholesterolemia. 5. Diverticulosis. 6. Glaucoma. 7. Postoperative urinary retention. 8. Postoperative anemia - transfused. 9. Postoperative confusion - resolved.  PROCEDURES:  Aortic valve replacement with a 21 mm pericardial valve on January 21, 2002 by Dr. Laneta Simmers.  BRIEF HISTORY:  The patient is a 77 year old white male medical patient of Dr. Delfin Edis who was evaluated for aortic valve replacement.  He is status post inferior myocardial infarction in June 1997 with angioplasty of the right coronary artery at that time.  He also had a left circumflex artery PTCA.  He had known aortic stenosis, remained asymptomatic until recently.  Over the past few months he has noticed decreased energy with ambulation and his wife notes that he  has been sleeping more than usual.  He has had exertional shortness of breath.  He denied any chest pain and had no dizziness or syncope.  Echocardiogram in May showed concentric left ventricular hypertrophy with a normal ejection fraction.  He had a calcified aortic stenosis with a peak gradient of 56 mmHg and a mean gradient of 38.  Aortic valve area was 0.8 sqcm and there was evidence of diastolic dysfunction.  He has subsequently undergone cardiac catheterization on February 05, 2002 by Dr. Deborah Chalk which showed normal right and left heart pressures.  Gradient across the valve was 31 mm with an aortic valve area of 0.75 cm.  Coronary angiography showed the left circumflex stent was widely patent.  The right coronary artery was a small vessel.  The posterior descending branch was totally occluded and filled by collaterals from the left.  Left ventricular ejection fraction was 55-60% with no mitral regurgitation.  There was inferior basilar hypokinesis.  After evaluation by Dr. Carman Ching, he was cleared for possible surgery.  He was evaluated by Dr. Laneta Simmers and it was his opinion the patient would benefit from aortic valve replacement.  The risks and benefits were discussed in detail and the patient was admitted electively for surgery at this time.  MEDICATIONS ON ADMISSION: 1. Lipitor 40 mg q.d. 2. WelChol 625 mg three tablets b.i.d. 3. Imdur 30 mg q.d. 4. Paxil 10 mg q.d. 5. Xalatan eyedrops one OU q.d. 6. Aspirin one q.d.  ALLERGIES:  AUGMENTIN, PENICILLIN, and MORPHINE.  For further history and physical please see the dictated note.  SOCIAL HISTORY:  The patient quit smoking 30 years ago.  He has approximately one alcoholic drink per day.  HOSPITAL  COURSE:  The patient was admitted and taken to the operating room the day of surgery.  He underwent aortic valve replacement with a 21 mm pericardial valve, serial #[id number] model #27 from [employer].  He tolerated the  procedure well but was anemic postoperatively and received FFP and 1 unit of packed cells postoperatively.  He was transferred to the ICU in satisfactory condition and extubated that evening, remained in sinus rhythm. Postoperative morning #1 he was hemodynamically stable, his chest tubes were removed, he was diuresed and transferred to the floor.  He was started on phase 1 cardiac rehab.  He made slow steady progress over the next 48 hours. He has had no postoperative arrhythmias.  He was started on Coumadin.  This has been monitored with daily protimes.  On January 26, 2002, postoperative day #5, he had a great deal of difficulty voiding.  It was ultimately decided to go ahead with the placement of a Foley catheter.  Dr. Vic Blackbird was also consulted.  Dr. Aldean Ast has treated him previously for urinary retention.  He did well after the Foley was inserted.  He was seen by Dr. Ala Bent associates and started on Flomax and Macrodantin because of his valve.  He has done well and the Foley has been removed on January 28, 2002, and he has been able to void at least once so far.  In addition to his urinary retention, he had some hypokalemia which has been treated with oral potassium supplements.  It is Dr. Garen Grams opinion that if he continues to do well and has no further problems he can be discharged in the a.m., January 29, 2002.  DISCHARGE MEDICATIONS:  If he goes home without a Foley, his medications will be:  1. Niferex one q.d.  2. Xalatan ophthalmic drops one drop OU q.d.  3. Paxil 10 mg p.o. q.d.  4. Potassium chloride 20 mEq one q.d. x7 days.  5. Lasix 40 mg one q.d. x7 days.  6. Altace 5 mg p.o. q.d.  7. Lipitor 40 mg q.d.  8. Flomax 0.4 mg q.d.  9. Ultram one to two p.o. q.4h. p.r.n. for pain. 10. Coumadin dose will probably be 2.5 mg a day but the final decision will be     made in the a.m. prior to discharge. 11. Proscar 5 mg p.o. q.d.   If he is unable to void and goes  home with a Foley we will add Macrodantin 50 mg p.o. q.12h. until the Foley is removed.  FOLLOW-UP:  He will return to see Dr. Deborah Chalk next week on February 02, 2002 to have his Coumadin level checked, then will have a formal appointment with Dr. Deborah Chalk and a chest x-ray in two weeks.  He will return to see Dr. Laneta Simmers in three weeks on February 24, 2002 at 10:30 a.m. with the chest x-ray from Dr. Angelina Pih office.  DISCHARGE ACTIVITY:  Light to moderate, no lifting over 10 pounds, no driving, no strenuous activity.  WOUND CARE:  He is to clean his incisions with plain soap and water, keep the incisions clean and dry.  CONDITION ON DISCHARGE: Improving. Dictated by:   Sherrie George, P.A. Attending Physician:  Cleatrice Burke DD:  01/28/02 TD:  01/28/02 Job: [id number]

## 2010-12-08 NOTE — Consult Note (Signed)
[facility]  Patient:    [name] [name], [name] [name] Visit Number: [id number] MRN: [id number]          Service Type: SUR Location: 2000 [id number] [id number] Attending Physician:  Cleatrice Burke Dictated by:   Rozanna Boer., M.D. Proc. Date: 01/28/02 Admit Date:  01/21/2002 Discharge Date: 01/29/2002   CC:         Alleen Borne, M.D.   Consultation Report  REASON FOR CONSULTATION:  Urinary retention.  BRIEF HISTORY:  This 77 year old patient has been a longtime patient of mine and had aortic valve replaced one week ago.  He has failed at least one voiding trial.  Bertram Millard. Dahlstedt, M.D. saw him two days ago and put him on some Flomax.  Because of the aortic valve, the reason to get the catheter out is a little bit more urgent than normal.  He had an inferior myocardial infarction in June of 1997 with angioplasty of the right coronary artery at that time and went into urinary retention at that time.  He has had two previous prostate biopsies in the past because of a nodule, in 1993 and 1995, both benign.  He has been on conservative management and I saw him last October 27, 2001, and he has been doing well.  He apparently had some blood in his urine two days ago, but is on Coumadin.  His other history is that he has right iliac stenosis and hypercholesterolemia, history of diverticulitis, and glaucoma.  PHYSICAL EXAMINATION:  VITAL SIGNS:  Stable, lying quietly in bed.  ABDOMEN:  Soft and benign.  GENITOURINARY:  His urine is draining just slightly pink urine.  Penis is normal with no lesions.  Bilaterally descended normal size testes.  Prostate is moderately enlarged about 50 grams, not fixed or indurated.  [place] vesicals not palpated.  IMPRESSION:  Urinary retention post aortic valve replacement.  RECOMMENDATIONS:  I have increased his Flomax from 1 to 2 a day and added Proscar which he started this morning.  The catheter came out and I saw  him about 12 hours later and he seems to be voiding enough to go home.  He will go home tomorrow and he will be covered with antibiotics and will go home on Flomax 2.4 mg tablets a day for two weeks and then I gave him a three-month prescription that he will send off and get.  I have also put him on Proscar 5 mg a day that he will also continue until he sees me in the office in about two to three weeks time.  IMPRESSION: 1. Resolved urinary retention. 2. Obstructive uropathy, presently on conservative management. 3. Previous recent aortic valve replacement. Dictated by:   Rozanna Boer., M.D. Attending Physician:  Cleatrice Burke DD:  01/28/02 TD:  02/01/02 Job: [id number]

## 2010-12-08 NOTE — H&P (Signed)
[name] [name], [name] [name]                          ACCOUNT NO.:  [id number]   MEDICAL RECORD NO.:  [id number]                   PATIENT TYPE:  INP   LOCATION:  NA                                   FACILITY:  [facility]   PHYSICIAN:  Balinda Quails, M.D.                 DATE OF BIRTH:  17-Jul-1923   DATE OF ADMISSION:  03/31/2003  DATE OF DISCHARGE:                                HISTORY & PHYSICAL   CHIEF COMPLAINT:  Back pain.   HISTORY OF PRESENT ILLNESS:  The patient is a 77 year old white male who  recently presented to the emergency department complaining of severe back  pain.  A CT scan was performed which showed a 5.1-cm infrarenal abdominal  aortic aneurysm without evidence of leak.  His other exam was negative and  he was discharged home.  He was seen as an outpatient by Dr. Balinda Quails  for evaluation of his aneurysm.  Dr. Madilyn Fireman recommended proceeding with  angiography to further evaluate his anatomy and this was performed on March 12, 2003.  Angiography confirmed the abdominal aortic aneurysm as well as  some calcification and stenosis of the right iliac artery.  He has continued  to have dull aching back pain since that time, as well as some intermittent  abdominal pain.  He denies any changes in bowel function, melena,  hematochezia or other symptoms.  It was recommended by Dr. Madilyn Fireman that he  proceed with abdominal aortic aneurysm repair and it was felt that he would  be a good candidate for AneuRx stent graft repair.  He has chosen to proceed  at this time.   PAST MEDICAL HISTORY:  1. Coronary artery disease, status post myocardial infarction in 1997 with     PTCA of the right coronary artery and stenting to the left circumflex     coronary.  2. History of aortic stenosis.  3. History of right iliac artery stenosis.  4. Hypercholesterolemia.  5. Glaucoma.  6. History of diverticulosis.  7. Hiatal hernia.  8. Herniated lumbar disk.  9. Benign prostatic hypertrophy.  10.      Cholelithiasis.  11.      Depression.   PAST SURGICAL HISTORY:  1. Aortic valve replacement with 21-mm bovine pericardial valve, January 21, 2002, by Dr. Evelene Croon,.  2. Appendectomy in 1949.  3. Kidney stones in 1977.   CURRENT MEDICATIONS:  1. Zetia 10 mg daily.  2. Enteric-coated aspirin 325 mg daily.  3. Lipitor 40 mg daily.  4. Flomax 0.4 mg daily.  5. Xalatan 0.005% drops OU daily.  6. Paxil 10 mg daily.  7. Diovan 80 mg daily.  8. Proscar 5 mg daily.   ALLERGIES:  AUGMENTIN and OTHER PENICILLIN DERIVATIVES cause a rash.  MORPHINE causes GI upset.  BETA BLOCKERS have caused severe bradycardia,  even at low doses.  FAMILY HISTORY:  His mother died at age 96 of cancer of the stomach and  colon.  His father died at age 75 of coronary artery disease.  His brother  died of congestive heart failure; he had a history of myocardial infarction  and prostate cancer.   SOCIAL HISTORY:  He is married and has two children.  He is retired.  He  previously smoked 1 pack of cigarettes per day and smoked for 15 to 20  years.  He quit around 30 years ago.  He consumes approximately 4 ounces of  alcohol per day.   REVIEW OF SYSTEMS:  See history of present illness for pertinent positives  and negatives.  Also, he has chronic hip pain and back pain related to his  herniated disk.  He does have some nocturia and occasional hesitancy with  voiding.  He reports a recent upper respiratory infection which did not  require antibiotics and is now resolving.  He has had bouts of  cholelithiasis in the past but none recently.  He has also had bouts of  diverticulosis in the past  but none recently.  He does wear glasses.  He  denies fevers, chills, weight loss, TIA symptoms, amaurosis fugax, visual  changes, dysphagia, weakness, fatigue, chest pain, palpitations, shortness  of breath, dyspnea on exertion, orthopnea, nausea, vomiting, diarrhea,  constipation, lower extremity edema,  claudication symptoms, intolerance to  heat or cold.   PHYSICAL EXAMINATION:  VITAL SIGNS:  Blood pressure 108/70.  Pulse is 64 and  regular.  Respirations 18 and unlabored.  GENERAL:  This is a well-developed, well-nourished white male in no acute  distress.  HEENT:  Normocephalic, atraumatic.  Pupils equal, round and react to light  and accommodation.  Extraocular movements intact.  Exam of the external ears  reveals no abnormalities.  He has bilateral cerumen impactions which are  occluding the TMs.  Nares are patent bilaterally.  Oropharynx is clear with  fair dentition.  NECK:  Neck supple without lymphadenopathy or thyromegaly.  There is a  slight radiation of a cardiac murmur to his carotids bilaterally.  LUNGS:  Lungs clear to auscultation.  HEART:  Regular rate and rhythm without murmurs, rubs, or gallops.  It has  crisp valve sounds.  ABDOMEN:  Abdomen soft, nontender and nondistended with active bowel sounds  in all quadrants.  There is a pulsatile midline mass to deep palpation with  no tenderness.  EXTREMITIES:  No clubbing, cyanosis, or edema.  He has bilateral lower  extremity varicosities.  He has 2+ femoral pulses with no palpable pedal  pulses.  NEUROLOGIC:  Cranial nerves II-XII grossly intact.  His gait is within  normal limits.    ASSESSMENT AND PLAN:  This is a 77 year old male with a 5.1-cm abdominal  aortic aneurysm.  He will be admitted to [facility] on March 31, 2003 and undergo AneuRx stent graft repair of his abdominal aortic aneurysm  by Dr. Madilyn Fireman.      Coral Ceo, P.A.                        Balinda Quails, M.D.    GC/MEDQ  D:  03/26/2003  T:  03/27/2003  Job:  [id number]   cc:   Colleen Can. Deborah Chalk, M.D.  Fax: [phone]   Maryla Morrow. Modesto Charon, M.D.  [address]  [place]  [place] [address]  Fax: [phone]

## 2010-12-08 NOTE — Op Note (Signed)
[name] [name], [name] [name]                ACCOUNT NO.:  [id number]   MEDICAL RECORD NO.:  [id number]          PATIENT TYPE:  AMB   LOCATION:  ENDO                         FACILITY:  [facility]   PHYSICIAN:  Graylin Shiver, M.D.   DATE OF BIRTH:  [birth year]   DATE OF PROCEDURE:  05/21/2005  DATE OF DISCHARGE:                                 OPERATIVE REPORT   PROCEDURE:  Colonoscopy.   INDICATIONS:  Screening.   Informed consent was obtained after explanation of the risks of bleeding,  infection and perforation.   PREMEDICATION:  Fentanyl 50 mcg IV, Versed 5 mg IV.  The patient was also  given preprocedure antibiotics due to a history of prosthetic heart valve.  He was given vancomycin 1 gram IV, gentamycin 80 mg IV.   PROCEDURE:  After informed consent was obtained after explanation of the  risks of bleeding, infection and perforation and after premedication the  rectum was examined by digital rectal examination, it was normal. The  Olympus colonoscope was then inserted into the rectum and advanced around  the colon to the cecum. Cecal landmarks were identified. The cecum and  ascending colon were normal. The transverse colon normal. The descending  colon and sigmoid revealed diverticulosis. The rectum was normal. He  tolerated the procedure well without complications.   IMPRESSION:  Diverticulosis of the left colon in the region of the sigmoid  and descending colon otherwise normal colonoscopy to the cecum.           ______________________________  Graylin Shiver, M.D.     SFG/MEDQ  D:  05/21/2005  T:  05/21/2005  Job:  [id number]

## 2010-12-08 NOTE — Consult Note (Signed)
   [name] [name], [name] [name]                          ACCOUNT NO.:  [id number]   MEDICAL RECORD NO.:  [id number]                   PATIENT TYPE:  INP   LOCATION:  2010                                 FACILITY:  [facility]   PHYSICIAN:  Sigmund I. Patsi Sears, M.D.         DATE OF BIRTH:  [birth year]   DATE OF CONSULTATION:  04/02/2003  DATE OF DISCHARGE:                                   CONSULTATION   UROLOGY CONSULTATION   HISTORY OF PRESENT ILLNESS:  The patient is two days status post abdominal  aortic aneurysm stent graft, having problems voiding.  His Foley catheter  was placed last night and the patient feels much better this morning.  He  states that he has had this problem before after his heart surgery and one  other time.  He is on Proscar and sometimes Flomax at home.   PAST MEDICAL HISTORY:  1. Benign prostatic hypertrophy on Proscar and Flomax at home.  2. Coronary artery disease.  3. Aortic stenosis.  4. Depression.  5. Cholelithiasis.  6. Herniated lumbar disc.  7. Hiatal hernia.  8. Diverticulosis.  9. Glaucoma.  10.      Hypercholesterolemia.  11.      Right iliac artery stenosis.   MEDICATIONS:  1. Zocor 80 mg p.o. daily.  2. Aspirin 325 mg p.o. daily.  3. Zetia 10 mg p.o. daily.  4. Proscar 5 mg p.o. daily.  5. Paxil 10 mg p.o. daily.  6. Xalatan ophthalmic drops, one drop OU daily.  7. Avapro 150 mg p.o. daily.   ALLERGIES:  1. AUGMENTIN and other PENICILLIN DERIVATIVES cause a rash.  2. BETA BLOCKERS cause severe bradycardia.  3. MORPHINE causes GI upset.   PHYSICAL EXAMINATION:  VITAL SIGNS:  Temperature 97.5, blood pressure  153/66, heart 80, respirations 18.  CARDIAC:  Regular rate and rhythm.  LUNGS:  Clear to auscultation bilaterally.  ABDOMEN:  Soft, no bladder distention.  Positive bowel sounds.  Foley  draining mostly clear urine-some minimal hematuria noted.   ASSESSMENT:  Postoperative urinary retention.   PLAN:  1. Will start Flomax now and  have the patient take this consistently.  2. Send patient home with Foley catheter and leg bag.  3. Follow up with Dr. Dennison Nancy. Kimbrough on Monday for a voiding trial.     Terri Piedra, N.P.                         Sigmund I. Patsi Sears, M.D.    HB/MEDQ  D:  04/02/2003  T:  04/02/2003  Job:  [id number]

## 2010-12-08 NOTE — Op Note (Signed)
[name] [name]                          ACCOUNT NO.:  [id number]   MEDICAL RECORD NO.:  [id number]                   PATIENT TYPE:  INP   LOCATION:  3316                                 FACILITY:  [facility]   PHYSICIAN:  Balinda Quails, M.D.                 DATE OF BIRTH:  [birth year]   DATE OF PROCEDURE:  03/31/2003  DATE OF DISCHARGE:                                 OPERATIVE REPORT   PREOPERATIVE DIAGNOSIS:  1. Bilateral common iliac artery stenosis.  2. 5.1 cm infrarenal abdominal aortic aneurysm.   POSTOPERATIVE DIAGNOSIS:  1. Bilateral common iliac artery stenosis.  2. 5.1 cm infrarenal abdominal aortic aneurysm.   OPERATION PERFORMED:  1. Bilateral kissing balloon common iliac artery percutaneous transluminal     angioplasty with 7 x 2 Powerflex balloon.  2. AneuRx abdominal aortic aneurysm repair (primary right iliac 28 x 16 x     16.5, left iliac 16 x 11.5).   SURGEON:  Balinda Quails, M.D.   ASSISTANT:  Janetta Hora. Fields, MD   ANESTHESIA:  General endotracheal.   ANESTHESIOLOGIST:  Sheldon Silvan, M.D.   COMPLICATIONS:  None apparent.   CONTRAST:  Visipaque.   FLUOROSCOPY TIME:  6.02 minutes.   INDICATIONS FOR PROCEDURE:  [name] is a 77 year old male with a  history of coronary artery disease and valve replacement.  He presented  recently to the emergency department with back pain and CT scan revealed a  5.1 cm infrarenal abdominal aortic aneurysm.  Arteriography was performed.  This revealed an adequate infrarenal aortic neck for placement of a stent  graft.  He did have tight common iliac artery stenoses bilaterally at their  origin, more severely involving the right than the left.  The patient was  brought to the operating room at this time for placement of an abdominal  aortic aneurysm repair, AneuRx stent graft.  Bilateral iliac balloon  angioplasty will be required prior to placement of the stent for access.   DESCRIPTION OF PROCEDURE:   The patient was brought to the operating room in  stable condition, placed in the supine position under general endotracheal  anesthesia.  The abdomen and both legs prepped and draped in a sterile  fashion.  Bilateral oblique groin skin incisions were made at the level of  the inguinal ligament.  Subcutaneous tissue and lymphatics divided with  electrocautery.  Deep dissection carried down to expose the inguinal  ligaments bilaterally. Common femoral artery, superficial femoral artery and  profunda femoris artery were all exposed.  These vessels were encircled with  loops.  The patient was administered 7000 units of heparin intravenously.  The common femoral artery was accessed bilaterally with a needle.  In the  right groin, a 0.035 magic torque guidewire was advanced through the needle  and across the right common iliac artery stenosis into the abdominal aorta.  In the left  groin, a 0.035 J-wire was advanced through the needle and across  the left common iliac artery stenosis into the abdominal aorta.  Bilateral 8  French sheaths were advanced over the guidewires.   Retrograde injection of 20mL of contrast solution was carried out to the  right femoral sheath.  This delineated the aortic bifurcation with bilateral  common iliac artery origin stenoses.  7 x 2 Powerflex balloons were then  advanced over the guidewires bilaterally and placed at the origin of the  common iliac arteries in a kissing technique.  These were deployed at eight  atmospheres times 30 seconds.  The balloon was then deflated and removed.  A  pigtail catheter was then advanced over the left femoral guidewire to the  suprarenal aorta.  An abdominal aortogram obtained.  This revealed widely  patent renal arteries, a long 3 cm infrarenal aortic neck.  There was still  mild to moderate residual stenosis present in the common iliac artery  origins bilaterally.  The catheter was then advanced over the right femoral  guide  wire and an Amplatz super stiff guidewire advanced through the  catheter.  The catheter and the right femoral sheath removed. A delivery  catheter with a 28 x 16 x 16.5 AneuRx stent was advanced over the Amplatz to  the suprarenal position. This was deployed initially suprarenal, brought  down to the immediate juxtarenal position and fully deployed into the right  common iliac artery.   The pigtail catheter was then drawn down into the aortic aneurysm sac.  An  angled stiff Glide wire was advanced through the catheter, the catheter  removed and a right coronary catheter placed over the guidewire.  With the  use of the right coronary catheter and angled stiff Glide wire, the left  iliac gate was accessed.  Catheter advanced over the guidewire, the  guidewire removed and exchange carried out for an Amplatz super stiff  guidewire.  The left femoral sheath removed and a delivery catheter with a  16 x 11.5 iliac stent was advanced over the Amplatz into the gate. This was  deployed high in the gate and brought down into the left common iliac  artery.  Nose cone and runners were removed.  Delivery catheter removed and  an 8 French sheath advanced over the guidewire.   The pigtail catheter was then advanced over the right femoral guidewire and  a completion arteriogram obtained.  This revealed no evidence of proximal  leak.  Tight iliac stenoses were present bilaterally.  12 x 2 Powerflex  balloons were then advanced over the guidewires bilaterally and deployed in  a kissing balloon technique throughout the length of the iliac limbs.  At  completion of this, the balloons were removed and a completion arteriogram  again obtained.  This revealed no significant residual stenosis at the  aortic bifurcation.   The catheters and guidewires were then removed.  The arteriotomies were  closed bilaterally with running 5-0 Prolene suture.  The groin incisions were then closed bilaterally with running 2-0  Vicryl suture in two  subcutaneous layers, a superficial layer of running 3-0 Vicryl suture and  skin closed with 4-0 Monocryl.  Steri-Strips were applied.   The patient tolerated the procedure well.  There were no apparent  complications.  Total of of Visipaque administered along with 6.02  minutes of fluoroscopy time.  The patient was transferred to PACU in stable  condition.  Balinda Quails, M.D.    PGH/MEDQ  D:  03/31/2003  T:  04/01/2003  Job:  [id number]   cc:   Colleen Can. Deborah Chalk, M.D.  Fax: [phone]

## 2011-02-14 ENCOUNTER — Ambulatory Visit (INDEPENDENT_AMBULATORY_CARE_PROVIDER_SITE_OTHER): Payer: Medicare Other | Admitting: Nurse Practitioner

## 2011-02-14 ENCOUNTER — Encounter: Payer: Self-pay | Admitting: Nurse Practitioner

## 2011-02-14 VITALS — BP 130/80 | HR 72 | Ht 66.5 in | Wt 182.8 lb

## 2011-02-14 DIAGNOSIS — Z733 Stress, not elsewhere classified: Secondary | ICD-10-CM

## 2011-02-14 DIAGNOSIS — F439 Reaction to severe stress, unspecified: Secondary | ICD-10-CM | POA: Insufficient documentation

## 2011-02-14 DIAGNOSIS — I10 Essential (primary) hypertension: Secondary | ICD-10-CM

## 2011-02-14 DIAGNOSIS — Z952 Presence of prosthetic heart valve: Secondary | ICD-10-CM

## 2011-02-14 DIAGNOSIS — I2510 Atherosclerotic heart disease of native coronary artery without angina pectoris: Secondary | ICD-10-CM

## 2011-02-14 DIAGNOSIS — Z954 Presence of other heart-valve replacement: Secondary | ICD-10-CM

## 2011-02-14 MED ORDER — ASPIRIN EC 81 MG PO TBEC: 81.0000 mg | DELAYED_RELEASE_TABLET | Freq: Every day | ORAL | Status: AC

## 2011-02-14 NOTE — Assessment & Plan Note (Signed)
The low dose Paxil has helped him. [name] [name] being on Aricept has helped also.

## 2011-02-14 NOTE — Assessment & Plan Note (Signed)
Blood pressure is ok. No change with his medicines.

## 2011-02-14 NOTE — Assessment & Plan Note (Signed)
Last echo was in 2009. He is now 54. He does not wish for any further testing at this time.

## 2011-02-14 NOTE — Assessment & Plan Note (Signed)
He is currently asymptomatic. No change in his medicines. We will see him back in 6 months. Labs are checked by Dr. Modesto Charon.

## 2011-02-14 NOTE — Patient Instructions (Signed)
Cut your aspirin back to 81 mg daily We will send you to see an ENT doctor about your nose bleeding I will have you see Dr. Olga Millers in 6 months.

## 2011-02-14 NOTE — Progress Notes (Signed)
[name] Date of Birth: [birth year]   History of Present Illness: [name] [name] is seen back today for his 4 month visit. He is seen for Dr. Jens Som. He is a former patient of Dr. Ronnald Nian. He is here with his wife. He has a history of known CAD and prior AVR. He is doing well. Low dose Paxil has helped him tremendously in dealing with [name]. She has dementia. [name] [name] is doing all the cooking and house work. No chest pain or shortness of breath. His only complaint is that he is having nose bleeds. He says it will just pour out. It has been going on for several months. He is on full dose aspirin. Blood pressure has been ok. He needs his handicap sticker updated.   Current Outpatient Prescriptions on File Prior to Visit  Medication Sig Dispense Refill  . atorvastatin (LIPITOR) 80 MG tablet Take 80 mg by mouth daily.        . finasteride (PROSCAR) 5 MG tablet Take 5 mg by mouth daily.        Marland Kitchen levothyroxine (SYNTHROID, LEVOTHROID) 50 MCG tablet Take 50 mcg by mouth daily.        Marland Kitchen losartan (COZAAR) 50 MG tablet Take 50 mg by mouth daily.        Marland Kitchen PLACEBO PO Take by mouth.        . DISCONTD: PARoxetine (PAXIL) 20 MG tablet Take 20 mg by mouth every morning.          Allergies  Allergen Reactions  . Augmentin   . Morphine And Related Nausea And Vomiting  . Penicillins Hives    Past Medical History  Diagnosis Date  . CVA (cerebral infarction) 12/09  . CAD (coronary artery disease)     stents in LXC and has a totally occluded RCA  . HTN (hypertension)   . Hyperlipemia   . Right iliac artery stenosis   . BPH (benign prostatic hyperplasia)   . Aortic stenosis     s/p AVR in 2003  . Glaucoma   . Diverticulosis   . AAA (abdominal aortic aneurysm)     s/p endovascular repair 2004    Past Surgical History  Procedure Date  . Aortic valve replacement 2003    #98mm pericardial valve   . Abdominal aortic aneurysm repair 2004    s/p endovascular repiar of AAA in 2004    History    Smoking status  . Former Smoker  . Types: Cigarettes  . Quit date: 10/04/1980  Smokeless tobacco  . Not on file    History  Alcohol Use  . Yes    SOCIAL ALCOHOL    Family History  Problem Relation Age of Onset  . Colon cancer Mother   . Aneurysm Father   . Heart attack Brother     Review of Systems: The review of systems is as above. He is able to sleep at night. No regular exercise. No chest pain or shortness of breath. He is back seeing Dr. Modesto Charon for his primary care.  All other systems were reviewed and are negative.  Physical Exam: BP 130/80  Pulse 72  Ht 5' 6.5" (1.689 m)  Wt 182 lb 12.8 oz (82.918 kg)  BMI 29.06 kg/m2 Patient is very pleasant and in no acute distress. Skin is warm and dry. Color is normal.  HEENT is unremarkable. Normocephalic/atraumatic. PERRL. Sclera are nonicteric. Neck is supple. No masses. No JVD. Lungs are clear. Cardiac exam shows a regular rate  and rhythm. He has a soft outflow murmur that radiates to the neck. Abdomen is soft. Extremities are without edema. Gait and ROM are intact. No gross neurologic deficits noted.  LABORATORY DATA:   Assessment / Plan:

## 2011-04-26 LAB — LIPID PANEL
HDL: 39 mg/dL — ABNORMAL LOW (ref >39)
LDL Cholesterol: 104 mg/dL — ABNORMAL HIGH (ref 0–99)
Total CHOL/HDL Ratio: 4.2 RATIO
VLDL: 22 mg/dL (ref 0–40)

## 2011-04-26 LAB — COMPREHENSIVE METABOLIC PANEL
ALT: 23 U/L (ref 0–53)
CO2: 27 mEq/L (ref 19–32)
Calcium: 8.9 mg/dL (ref 8.4–10.5)
Creatinine, Ser: 1.25 mg/dL (ref 0.4–1.5)
GFR calc non Af Amer: 55 mL/min — ABNORMAL LOW (ref >60)
Glucose, Bld: 158 mg/dL — ABNORMAL HIGH (ref 70–99)
Total Bilirubin: 1.4 mg/dL — ABNORMAL HIGH (ref 0.3–1.2)

## 2011-04-26 LAB — CBC
Hemoglobin: 14.9 g/dL (ref 13.0–17.0)
MCHC: 33 g/dL (ref 30.0–36.0)
MCV: 95.9 fL (ref 78.0–100.0)
RBC: 4.7 MIL/uL (ref 4.22–5.81)

## 2011-04-26 LAB — HOMOCYSTEINE: Homocysteine: 9.2 umol/L (ref 4.0–15.4)

## 2011-04-26 LAB — HEMOGLOBIN A1C: Hgb A1c MFr Bld: 6.2 % — ABNORMAL HIGH (ref 4.6–6.1)

## 2011-04-26 LAB — PROTIME-INR: Prothrombin Time: 14.9 seconds (ref 11.6–15.2)

## 2011-04-27 LAB — DIFFERENTIAL
Basophils Relative: 1 % (ref 0–1)
Eosinophils Absolute: 0.3 10*3/uL (ref 0.0–0.7)
Eosinophils Relative: 4 % (ref 0–5)
Lymphs Abs: 1 10*3/uL (ref 0.7–4.0)
Monocytes Absolute: 0.7 10*3/uL (ref 0.1–1.0)
Monocytes Relative: 9 % (ref 3–12)
Neutrophils Relative %: 72 % (ref 43–77)

## 2011-04-27 LAB — COMPREHENSIVE METABOLIC PANEL
ALT: 20 U/L (ref 0–53)
AST: 35 U/L (ref 0–37)
Albumin: 4.5 g/dL (ref 3.5–5.2)
Alkaline Phosphatase: 100 U/L (ref 39–117)
Calcium: 9.5 mg/dL (ref 8.4–10.5)
GFR calc Af Amer: >60 mL/min (ref >60)
Glucose, Bld: 75 mg/dL (ref 70–99)
Potassium: 4 mEq/L (ref 3.5–5.1)
Sodium: 144 mEq/L (ref 135–145)
Total Protein: 7.9 g/dL (ref 6.0–8.3)

## 2011-04-27 LAB — CBC
Hemoglobin: 15.7 g/dL (ref 13.0–17.0)
RDW: 13 % (ref 11.5–15.5)

## 2011-04-27 LAB — POCT CARDIAC MARKERS
Myoglobin, poc: 87.6 ng/mL (ref 12–200)
Troponin i, poc: <0.05 ng/mL (ref 0.00–0.09)

## 2011-09-12 ENCOUNTER — Other Ambulatory Visit: Payer: Medicare Other

## 2011-09-12 ENCOUNTER — Encounter (INDEPENDENT_AMBULATORY_CARE_PROVIDER_SITE_OTHER): Payer: Medicare Other | Admitting: Vascular Surgery

## 2011-09-12 DIAGNOSIS — I714 Abdominal aortic aneurysm, without rupture: Secondary | ICD-10-CM

## 2011-09-12 DIAGNOSIS — Z48812 Encounter for surgical aftercare following surgery on the circulatory system: Secondary | ICD-10-CM

## 2011-09-17 ENCOUNTER — Other Ambulatory Visit: Payer: Self-pay | Admitting: *Deleted

## 2011-09-17 DIAGNOSIS — I714 Abdominal aortic aneurysm, without rupture: Secondary | ICD-10-CM

## 2011-09-17 DIAGNOSIS — Z48812 Encounter for surgical aftercare following surgery on the circulatory system: Secondary | ICD-10-CM

## 2011-09-18 ENCOUNTER — Encounter: Payer: Self-pay | Admitting: Surgery

## 2011-09-18 NOTE — Procedures (Unsigned)
VASCULAR LAB EXAM  INDICATION:  Follow up AAA endograft placed on 03/31/2003.  HISTORY: Diabetes:  No. Cardiac:  Yes. Hypertension:  Yes. Smoking:  Previously.  EXAM:  AAA sac size;  3.83 CM AP, 3.62 CM PRV Previous sac size; 08/29/2010  3.35 CM AP, 3.57 CM PRV  IMPRESSION: 1. The aorta and endograft appear patent. 2. No significant change in size of the aneurysmal sac surrounding the     endograft. 3. No evidence of endoleak was detected.  ___________________________________________ V. Charlena Cross, MD  SH/MEDQ  D:  09/12/2011  T:  09/12/2011  Job:  [id number]

## 2012-09-08 ENCOUNTER — Ambulatory Visit: Payer: Medicare Other | Admitting: Neurosurgery

## 2012-09-15 ENCOUNTER — Telehealth: Payer: Self-pay | Admitting: Family Medicine

## 2012-09-15 ENCOUNTER — Other Ambulatory Visit: Payer: Self-pay | Admitting: Family Medicine

## 2012-09-15 ENCOUNTER — Encounter: Payer: Self-pay | Admitting: Family Medicine

## 2012-09-15 ENCOUNTER — Ambulatory Visit (INDEPENDENT_AMBULATORY_CARE_PROVIDER_SITE_OTHER): Payer: Medicare Other | Admitting: Family Medicine

## 2012-09-15 VITALS — BP 140/60 | HR 74 | Temp 97.9°F | Ht 66.5 in | Wt 189.1 lb

## 2012-09-15 DIAGNOSIS — E785 Hyperlipidemia, unspecified: Secondary | ICD-10-CM

## 2012-09-15 DIAGNOSIS — I10 Essential (primary) hypertension: Secondary | ICD-10-CM

## 2012-09-15 DIAGNOSIS — I639 Cerebral infarction, unspecified: Secondary | ICD-10-CM | POA: Insufficient documentation

## 2012-09-15 DIAGNOSIS — I350 Nonrheumatic aortic (valve) stenosis: Secondary | ICD-10-CM

## 2012-09-15 DIAGNOSIS — C4491 Basal cell carcinoma of skin, unspecified: Secondary | ICD-10-CM

## 2012-09-15 DIAGNOSIS — Z8612 Personal history of poliomyelitis: Secondary | ICD-10-CM

## 2012-09-15 DIAGNOSIS — E079 Disorder of thyroid, unspecified: Secondary | ICD-10-CM | POA: Insufficient documentation

## 2012-09-15 DIAGNOSIS — I6350 Cerebral infarction due to unspecified occlusion or stenosis of unspecified cerebral artery: Secondary | ICD-10-CM

## 2012-09-15 DIAGNOSIS — H409 Unspecified glaucoma: Secondary | ICD-10-CM | POA: Insufficient documentation

## 2012-09-15 DIAGNOSIS — I359 Nonrheumatic aortic valve disorder, unspecified: Secondary | ICD-10-CM

## 2012-09-15 NOTE — Telephone Encounter (Signed)
Labs ordered.

## 2012-09-15 NOTE — Telephone Encounter (Signed)
Labs prior to visit lipid, renal, cbc, tsh, hepatic, magnesium  Please order labs. Patient has appointment at the end of April and will be going to [facility] lab.

## 2012-09-15 NOTE — Patient Instructions (Addendum)
Rel of ref Dr Mod[place] Charon, [place] Try drinking a gatorade daily Hyland's nighttime leg cramp medicine  Labs prior to visit lipid, renal, cbc, tsh, hepatic, magnesium   Preventive Care for Adults, Male A healthy lifestyle and preventive care can promote health and wellness. Preventive health guidelines for men include the following key practices:  A routine yearly physical is a good way to check with your caregiver about your health and preventative screening. It is a chance to share any concerns and updates on your health, and to receive a thorough exam.  Visit your dentist for a routine exam and preventative care every 6 months. Brush your teeth twice a day and floss once a day. Good oral hygiene prevents tooth decay and gum disease.  The frequency of eye exams is based on your age, health, family medical history, use of contact lenses, and other factors. Follow your caregiver's recommendations for frequency of eye exams.  Eat a healthy diet. Foods like vegetables, fruits, whole grains, low-fat dairy products, and lean protein foods contain the nutrients you need without too many calories. Decrease your intake of foods high in solid fats, added sugars, and salt. Eat the right amount of calories for you.Get information about a proper diet from your caregiver, if necessary.  Regular physical exercise is one of the most important things you can do for your health. Most adults should get at least 150 minutes of moderate-intensity exercise (any activity that increases your heart rate and causes you to sweat) each week. In addition, most adults need muscle-strengthening exercises on 2 or more days a week.  Maintain a healthy weight. The body mass index (BMI) is a screening tool to identify possible weight problems. It provides an estimate of body fat based on height and weight. Your caregiver can help determine your BMI, and can help you achieve or maintain a healthy weight.For adults 20 years and  older:  A BMI below 18.5 is considered underweight.  A BMI of 18.5 to 24.9 is normal.  A BMI of 25 to 29.9 is considered overweight.  A BMI of 30 and above is considered obese.  Maintain normal blood lipids and cholesterol levels by exercising and minimizing your intake of saturated fat. Eat a balanced diet with plenty of fruit and vegetables. Blood tests for lipids and cholesterol should begin at age 40 and be repeated every 5 years. If your lipid or cholesterol levels are high, you are over 50, or you are a high risk for heart disease, you may need your cholesterol levels checked more frequently.Ongoing high lipid and cholesterol levels should be treated with medicines if diet and exercise are not effective.  If you smoke, find out from your caregiver how to quit. If you do not use tobacco, do not start.  If you choose to drink alcohol, do not exceed 2 drinks per day. One drink is considered to be 12 ounces (355 mL) of beer, 5 ounces (148 mL) of wine, or 1.5 ounces (44 mL) of liquor.  Avoid use of street drugs. Do not share needles with anyone. Ask for help if you need support or instructions about stopping the use of drugs.  High blood pressure causes heart disease and increases the risk of stroke. Your blood pressure should be checked at least every 1 to 2 years. Ongoing high blood pressure should be treated with medicines, if weight loss and exercise are not effective.  If you are 72 to 77 years old, ask your caregiver if you  should take aspirin to prevent heart disease.  Diabetes screening involves taking a blood sample to check your fasting blood sugar level. This should be done once every 3 years, after age 38, if you are within normal weight and without risk factors for diabetes. Testing should be considered at a younger age or be carried out more frequently if you are overweight and have at least 1 risk factor for diabetes.  Colorectal cancer can be detected and often prevented.  Most routine colorectal cancer screening begins at the age of 48 and continues through age 1. However, your caregiver may recommend screening at an earlier age if you have risk factors for colon cancer. On a yearly basis, your caregiver may provide home test kits to check for hidden blood in the stool. Use of a small camera at the end of a tube, to directly examine the colon (sigmoidoscopy or colonoscopy), can detect the earliest forms of colorectal cancer. Talk to your caregiver about this at age 23, when routine screening begins. Direct examination of the colon should be repeated every 5 to 10 years through age 59, unless early forms of pre-cancerous polyps or small growths are found.  Hepatitis C blood testing is recommended for all people born from [birth year] through 1965 and any individual with known risks for hepatitis C.  Practice safe sex. Use condoms and avoid high-risk sexual practices to reduce the spread of sexually transmitted infections (STIs). STIs include gonorrhea, chlamydia, syphilis, trichomonas, herpes, HPV, and human immunodeficiency virus (HIV). Herpes, HIV, and HPV are viral illnesses that have no cure. They can result in disability, cancer, and death.  A one-time screening for abdominal aortic aneurysm (AAA) and surgical repair of large AAAs by sound wave imaging (ultrasonography) is recommended for ages 37 to 61 years who are current or former smokers.  Healthy men should no longer receive prostate-specific antigen (PSA) blood tests as part of routine cancer screening. Consult with your caregiver about prostate cancer screening.  Testicular cancer screening is not recommended for adult males who have no symptoms. Screening includes self-exam, caregiver exam, and other screening tests. Consult with your caregiver about any symptoms you have or any concerns you have about testicular cancer.  Use sunscreen with skin protection factor (SPF) of 30 or more. Apply sunscreen liberally and  repeatedly throughout the day. You should seek shade when your shadow is shorter than you. Protect yourself by wearing long sleeves, pants, a wide-brimmed hat, and sunglasses year round, whenever you are outdoors.  Once a month, do a whole body skin exam, using a mirror to look at the skin on your back. Notify your caregiver of new moles, moles that have irregular borders, moles that are larger than a pencil eraser, or moles that have changed in shape or color.  Stay current with required immunizations.  Influenza. You need a dose every fall (or winter). The composition of the flu vaccine changes each year, so being vaccinated once is not enough.  Pneumococcal polysaccharide. You need 1 to 2 doses if you smoke cigarettes or if you have certain chronic medical conditions. You need 1 dose at age 27 (or older) if you have never been vaccinated.  Tetanus, diphtheria, pertussis (Tdap, Td). Get 1 dose of Tdap vaccine if you are younger than age 83 years, are over 62 and have contact with an infant, are a Research scientist (physical sciences), or simply want to be protected from whooping cough. After that, you need a Td booster dose every 10 years. Consult your  caregiver if you have not had at least 3 tetanus and diphtheria-containing shots sometime in your life or have a deep or dirty wound.  HPV. This vaccine is recommended for males 13 through 77 years of age. This vaccine may be given to men 22 through 77 years of age who have not completed the 3 dose series. It is recommended for men through age 47 who have sex with men or whose immune system is weakened because of HIV infection, other illness, or medications. The vaccine is given in 3 doses over 6 months.  Measles, mumps, rubella (MMR). You need at least 1 dose of MMR if you were born in 1957 or later. You may also need a 2nd dose.  Meningococcal. If you are age 28 to 22 years and a Orthoptist living in a residence hall, or have one of several medical  conditions, you need to get vaccinated against meningococcal disease. You may also need additional booster doses.  Zoster (shingles). If you are age 26 years or older, you should get this vaccine.  Varicella (chickenpox). If you have never had chickenpox or you were vaccinated but received only 1 dose, talk to your caregiver to find out if you need this vaccine.  Hepatitis A. You need this vaccine if you have a specific risk factor for hepatitis A virus infection, or you simply wish to be protected from this disease. The vaccine is usually given as 2 doses, 6 to 18 months apart.  Hepatitis B. You need this vaccine if you have a specific risk factor for hepatitis B virus infection or you simply wish to be protected from this disease. The vaccine is given in 3 doses, usually over 6 months. Preventative Service / Frequency Ages 31 to 1  Blood pressure check.** / Every 1 to 2 years.  Lipid and cholesterol check.** / Every 5 years beginning at age 39.  Hepatitis C blood test.** / For any individual with known risks for hepatitis C.  Skin self-exam. / Monthly.  Influenza immunization.** / Every year.  Pneumococcal polysaccharide immunization.** / 1 to 2 doses if you smoke cigarettes or if you have certain chronic medical conditions.  Tetanus, diphtheria, pertussis (Tdap,Td) immunization. / A one-time dose of Tdap vaccine. After that, you need a Td booster dose every 10 years.  HPV immunization. / 3 doses over 6 months, if 26 and younger.  Measles, mumps, rubella (MMR) immunization. / You need at least 1 dose of MMR if you were born in 1957 or later. You may also need a 2nd dose.  Meningococcal immunization. / 1 dose if you are age 58 to 70 years and a Orthoptist living in a residence hall, or have one of several medical conditions, you need to get vaccinated against meningococcal disease. You may also need additional booster doses.  Varicella immunization.** / Consult your  caregiver.  Hepatitis A immunization.** / Consult your caregiver. 2 doses, 6 to 18 months apart.  Hepatitis B immunization.** / Consult your caregiver. 3 doses usually over 6 months. Ages 70 to 54  Blood pressure check.** / Every 1 to 2 years.  Lipid and cholesterol check.** / Every 5 years beginning at age 59.  Fecal occult blood test (FOBT) of stool. / Every year beginning at age 75 and continuing until age 90. You may not have to do this test if you get colonoscopy every 10 years.  Flexible sigmoidoscopy** or colonoscopy.** / Every 5 years for a flexible sigmoidoscopy or every  10 years for a colonoscopy beginning at age 59 and continuing until age 98.  Hepatitis C blood test.** / For all people born from [birth year] through 1965 and any individual with known risks for hepatitis C.  Skin self-exam. / Monthly.  Influenza immunization.** / Every year.  Pneumococcal polysaccharide immunization.** / 1 to 2 doses if you smoke cigarettes or if you have certain chronic medical conditions.  Tetanus, diphtheria, pertussis (Tdap/Td) immunization.** / A one-time dose of Tdap vaccine. After that, you need a Td booster dose every 10 years.  Measles, mumps, rubella (MMR) immunization. / You need at least 1 dose of MMR if you were born in 1957 or later. You may also need a 2nd dose.  Varicella immunization.**/ Consult your caregiver.  Meningococcal immunization.** / Consult your caregiver.  Hepatitis A immunization.** / Consult your caregiver. 2 doses, 6 to 18 months apart.  Hepatitis B immunization.** / Consult your caregiver. 3 doses, usually over 6 months. Ages 33 and over  Blood pressure check.** / Every 1 to 2 years.  Lipid and cholesterol check.**/ Every 5 years beginning at age 7.  Fecal occult blood test (FOBT) of stool. / Every year beginning at age 26 and continuing until age 36. You may not have to do this test if you get colonoscopy every 10 years.  Flexible sigmoidoscopy** or  colonoscopy.** / Every 5 years for a flexible sigmoidoscopy or every 10 years for a colonoscopy beginning at age 55 and continuing until age 42.  Hepatitis C blood test.** / For all people born from [birth year] through 1963 and any individual with known risks for hepatitis C.  Abdominal aortic aneurysm (AAA) screening.** / A one-time screening for ages 26 to 31 years who are current or former smokers.  Skin self-exam. / Monthly.  Influenza immunization.** / Every year.  Pneumococcal polysaccharide immunization.** / 1 dose at age 67 (or older) if you have never been vaccinated.  Tetanus, diphtheria, pertussis (Tdap, Td) immunization. / A one-time dose of Tdap vaccine if you are over 65 and have contact with an infant, are a Research scientist (physical sciences), or simply want to be protected from whooping cough. After that, you need a Td booster dose every 10 years.  Varicella immunization. ** / Consult your caregiver.  Meningococcal immunization.** / Consult your caregiver.  Hepatitis A immunization. ** / Consult your caregiver. 2 doses, 6 to 18 months apart.  Hepatitis B immunization.** / Check with your caregiver. 3 doses, usually over 6 months. **Family history and personal history of risk and conditions may change your caregiver's recommendations. Document Released: 09/04/2001 Document Revised: 10/01/2011 Document Reviewed: 12/04/2010 [facility] Patient Information 2013 [place], [place].

## 2012-09-17 ENCOUNTER — Encounter: Payer: Self-pay | Admitting: Family Medicine

## 2012-09-17 DIAGNOSIS — C4491 Basal cell carcinoma of skin, unspecified: Secondary | ICD-10-CM

## 2012-09-17 DIAGNOSIS — Z8612 Personal history of poliomyelitis: Secondary | ICD-10-CM

## 2012-09-17 HISTORY — DX: Personal history of poliomyelitis: Z86.12

## 2012-09-17 HISTORY — DX: Basal cell carcinoma of skin, unspecified: C44.91

## 2012-09-17 NOTE — Assessment & Plan Note (Signed)
No recent episodes

## 2012-09-17 NOTE — Assessment & Plan Note (Signed)
Doing very well

## 2012-09-17 NOTE — Assessment & Plan Note (Signed)
Well controlled, no changes today 

## 2012-09-17 NOTE — Progress Notes (Signed)
Patient ID: [name] [name], male   DOB: 08/04/1922, 79 y.o.   MRN: [id number] CEDAR DITULLIO [id number] 01-26-1923 09/17/2012      Progress Note-Follow Up  Subjective  Chief Complaint  Chief Complaint  Patient presents with  . Establish Care    new patient    HPI  Patient is an 79 year old Caucasian male who is in today to establish care with his wife. He is generally in good health at the present time but has a complicated past medical history. Had aortic valve replacement in 2003 and has a significant history of coronary artery disease. Had polio as a child not with any great residual. Also has history of stroke. At present he's had no recent illness. Has no GI or GU complaints. No fevers no chest pain or palpitation shortness of breath.  Past Medical History  Diagnosis Date  . CVA (cerebral infarction) 12/09  . CAD (coronary artery disease)     stents in LXC and has a totally occluded RCA  . HTN (hypertension)   . Right iliac artery stenosis   . BPH (benign prostatic hyperplasia)   . Aortic stenosis     s/p AVR in 2003, bovine  . Diverticulosis   . AAA (abdominal aortic aneurysm)     s/p endovascular repair 2004  . Hyperlipemia   . Thyroid disease   . Glaucoma(365)     recently told by Dr Eulah Pont he did not have it  . BCC (basal cell carcinoma) 09/17/2012  . Personal history of poliomyelitis 09/17/2012    In childhood    Past Surgical History  Procedure Laterality Date  . Aortic valve replacement  2003    #79mm pericardial valve   . Abdominal aortic aneurysm repair  2004    s/p endovascular repiar of AAA in 2004    Family History  Problem Relation Age of Onset  . Colon cancer Mother   . Aneurysm Father   . Heart attack Brother     CHF  . Cancer Brother     prostate    History   Social History  . Marital Status: Married    Spouse Name: N/A    Number of Children: N/A  . Years of Education: N/A   Occupational History  . Not on file.   Social History  Main Topics  . Smoking status: Former Smoker    Types: Cigarettes    Quit date: 10/04/1980  . Smokeless tobacco: Not on file  . Alcohol Use: Yes     Comment: SOCIAL ALCOHOL  . Drug Use: No  . Sexually Active: Not on file   Other Topics Concern  . Not on file   Social History Narrative  . No narrative on file    Current Outpatient Prescriptions on File Prior to Visit  Medication Sig Dispense Refill  . atorvastatin (LIPITOR) 80 MG tablet Take 80 mg by mouth daily.        . finasteride (PROSCAR) 5 MG tablet Take 5 mg by mouth daily.        Marland Kitchen levothyroxine (SYNTHROID, LEVOTHROID) 50 MCG tablet Take 50 mcg by mouth daily.        Marland Kitchen losartan (COZAAR) 50 MG tablet Take 50 mg by mouth daily.        Marland Kitchen PARoxetine (PAXIL) 10 MG tablet Take 10 mg by mouth every morning.        Marland Kitchen PLACEBO PO Take by mouth. 45 mg daily- research       No  current facility-administered medications on file prior to visit.    Allergies  Allergen Reactions  . Amoxicillin-Pot Clavulanate   . Morphine And Related Nausea And Vomiting  . Penicillins Hives    Review of Systems  Review of Systems  Constitutional: Positive for malaise/fatigue. Negative for fever and chills.  HENT: Negative for hearing loss, nosebleeds and congestion.   Eyes: Negative for discharge.  Respiratory: Negative for cough, sputum production, shortness of breath and wheezing.   Cardiovascular: Negative for chest pain, palpitations and leg swelling.  Gastrointestinal: Negative for heartburn, nausea, vomiting, abdominal pain, diarrhea, constipation and blood in stool.  Genitourinary: Negative for dysuria, urgency, frequency and hematuria.  Musculoskeletal: Negative for myalgias, back pain and falls.  Skin: Negative for rash.  Neurological: Negative for dizziness, tremors, sensory change, focal weakness, loss of consciousness, weakness and headaches.  Endo/Heme/Allergies: Negative for polydipsia. Does not bruise/bleed easily.   Psychiatric/Behavioral: Negative for depression and suicidal ideas. The patient is not nervous/anxious and does not have insomnia.     Objective  BP 140/60  Pulse 74  Temp(Src) 97.9 F (36.6 C) (Oral)  Ht 5' 6.5" (1.689 m)  Wt 189 lb 1.3 oz (85.766 kg)  BMI 30.06 kg/m2  SpO2 91%  Physical Exam  Physical Exam  Constitutional: He is oriented to person, place, and time and well-developed, well-nourished, and in no distress. No distress.  HENT:  Head: Normocephalic and atraumatic.  Eyes: Conjunctivae are normal.  Neck: Neck supple. No thyromegaly present.  Cardiovascular: Normal rate and regular rhythm.   Murmur heard. Mechanical click  Pulmonary/Chest: Effort normal and breath sounds normal. No respiratory distress.  Abdominal: He exhibits no distension and no mass. There is no tenderness.  Musculoskeletal: He exhibits no edema.  Neurological: He is alert and oriented to person, place, and time.  Skin: Skin is warm.  Psychiatric: Memory, affect and judgment normal.    No results found for this basename: TSH   Lab Results  Component Value Date   WBC 9.6 07/03/2008   HGB 14.9 07/03/2008   HCT 45.1 07/03/2008   MCV 95.9 07/03/2008   PLT 136* 07/03/2008   Lab Results  Component Value Date   CREATININE 1.25 07/03/2008   BUN 20 07/03/2008   NA 141 07/03/2008   K 4.2 07/03/2008   CL 108 07/03/2008   CO2 27 07/03/2008   Lab Results  Component Value Date   ALT 23 07/03/2008   AST 27 07/03/2008   ALKPHOS 76 07/03/2008   BILITOT 1.4* 07/03/2008   Lab Results  Component Value Date   CHOL  Value: 165        ATP III CLASSIFICATION:  <200     mg/dL   Desirable  161-096  mg/dL   Borderline High  >=045    mg/dL   High 40/98/1191   Lab Results  Component Value Date   HDL 39* 07/03/2008   Lab Results  Component Value Date   LDLCALC  Value: 104        Total Cholesterol/HDL:CHD Risk Coronary Heart Disease Risk Table                     Men   Women  1/2 Average Risk    3.4   3.3* 07/03/2008   Lab Results  Component Value Date   TRIG 111 07/03/2008   Lab Results  Component Value Date   CHOLHDL 4.2 07/03/2008     Assessment & Plan  HTN (hypertension) Well controlled, no  changes today  CVA (cerebral infarction) No recent episodes  Hyperlipemia Tolerating Atorvastatin and Zetia, avoid trans fats.  Aortic stenosis Doing very well

## 2012-09-17 NOTE — Assessment & Plan Note (Signed)
Tolerating Atorvastatin and Zetia, avoid trans fats.

## 2012-09-29 ENCOUNTER — Other Ambulatory Visit: Payer: Self-pay | Admitting: *Deleted

## 2012-09-29 ENCOUNTER — Encounter (INDEPENDENT_AMBULATORY_CARE_PROVIDER_SITE_OTHER): Payer: Medicare Other | Admitting: *Deleted

## 2012-09-29 ENCOUNTER — Ambulatory Visit (INDEPENDENT_AMBULATORY_CARE_PROVIDER_SITE_OTHER): Payer: Medicare Other | Admitting: Surgery

## 2012-09-29 ENCOUNTER — Encounter: Payer: Self-pay | Admitting: Surgery

## 2012-09-29 VITALS — BP 130/59 | HR 65 | Ht 66.5 in | Wt 187.0 lb

## 2012-09-29 DIAGNOSIS — Z48812 Encounter for surgical aftercare following surgery on the circulatory system: Secondary | ICD-10-CM

## 2012-09-29 DIAGNOSIS — I714 Abdominal aortic aneurysm, without rupture, unspecified: Secondary | ICD-10-CM | POA: Insufficient documentation

## 2012-09-29 NOTE — Progress Notes (Signed)
[facility] [place]   Patient name: [name] [name] MRN: [id number] DOB: 1923-06-16 Sex: male     Chief Complaint  Patient presents with  . AAA    1 year f/u EVAR 03/31/2003    HISTORY OF PRESENT ILLNESS: The patient is back today for followup. He is status post endovascular repair of abdominal aortic aneurysm by Dr. Madilyn Fireman in 2004 using a AneuRx stent graft. He has no complaints. He denies back pain. He denies abdominal pain. He stated that his wife now has dementia and taking care of her has become a significant challenge for him.  Past Medical History  Diagnosis Date  . CVA (cerebral infarction) 12/09  . CAD (coronary artery disease)     stents in LXC and has a totally occluded RCA  . HTN (hypertension)   . Right iliac artery stenosis   . BPH (benign prostatic hyperplasia)   . Aortic stenosis     s/p AVR in 2003, bovine  . Diverticulosis   . AAA (abdominal aortic aneurysm)     s/p endovascular repair 2004  . Hyperlipemia   . Thyroid disease   . Glaucoma(365)     recently told by Dr Eulah Pont he did not have it  . BCC (basal cell carcinoma) 09/17/2012  . Personal history of poliomyelitis 09/17/2012    In childhood    Past Surgical History  Procedure Laterality Date  . Aortic valve replacement  2003    #68mm pericardial valve   . Abdominal aortic aneurysm repair  2004    s/p endovascular repiar of AAA in 2004    History   Social History  . Marital Status: Married    Spouse Name: N/A    Number of Children: N/A  . Years of Education: N/A   Occupational History  . Not on file.   Social History Main Topics  . Smoking status: Former Smoker    Types: Cigarettes    Quit date: 10/04/1980  . Smokeless tobacco: Not on file  . Alcohol Use: Yes     Comment: SOCIAL ALCOHOL  . Drug Use: No  . Sexually Active: Not on file   Other Topics Concern  . Not on file   Social History Narrative  . No narrative on file    Family History  Problem  Relation Age of Onset  . Colon cancer Mother   . Aneurysm Father   . Heart attack Brother     CHF  . Cancer Brother     prostate    Allergies as of 09/29/2012 - Review Complete 09/29/2012  Allergen Reaction Noted  . Amoxicillin-pot clavulanate  10/16/2010  . Morphine and related Nausea And Vomiting 10/05/2010  . Penicillins Hives 10/05/2010    Current Outpatient Prescriptions on File Prior to Visit  Medication Sig Dispense Refill  . atorvastatin (LIPITOR) 80 MG tablet Take 80 mg by mouth daily.        . B Complex-C (SUPER B COMPLEX) TABS Take 1 tablet by mouth daily.      . betamethasone dipropionate (DIPROLENE) 0.05 % cream       . finasteride (PROSCAR) 5 MG tablet Take 5 mg by mouth daily.        Marland Kitchen levothyroxine (SYNTHROID, LEVOTHROID) 50 MCG tablet Take 50 mcg by mouth daily.        Marland Kitchen losartan (COZAAR) 50 MG tablet Take 50 mg by mouth daily.        Marland Kitchen PARoxetine (PAXIL) 10 MG tablet Take  10 mg by mouth every morning.        Marland Kitchen PLACEBO PO Take by mouth. 45 mg daily- research      . ZETIA 10 MG tablet        No current facility-administered medications on file prior to visit.     REVIEW OF SYSTEMS: Cardiovascular: No chest pain, chest pressure,  Pulmonary: No productive cough, asthma or wheezing. Neurologic: No weakness, paresthesias, aphasia, or amaurosis. No dizziness. Hematologic: No bleeding problems or clotting disorders. Musculoskeletal: No joint pain or joint swelling. Gastrointestinal: No blood in stool or hematemesis Genitourinary: No dysuria or hematuria. Psychiatric:: No history of major depression. Integumentary: No rashes or ulcers. Constitutional: No fever or chills.  PHYSICAL EXAMINATION:   Vital signs are BP 130/59  Pulse 65  Ht 5' 6.5" (1.689 m)  Wt 187 lb (84.823 kg)  BMI 29.73 kg/m2  SpO2 94% General: The patient appears their stated age. HEENT:  No gross abnormalities Pulmonary:  Non labored breathing Abdomen: Soft and non-tender no pulsatile  mass Musculoskeletal: There are no major deformities. Neurologic: No focal weakness or paresthesias are detected, Skin: There are no ulcer or rashes noted. Psychiatric: The patient has normal affect. Cardiovascular: There is a regular rate and rhythm without significant murmur appreciated. Palpable posterior tibial pulse bilaterally   Diagnostic Studies Duplex ultrasound has been reviewed. This shows an increase in the size of his aneurysm. Today it measures 4.5 x 4.1. Previously it was 3.8 x 3.6. This was a technically difficult study which was limited by bowel gas.  Assessment: Status post endovascular aneurysm repair Plan: I suspect that today's change in the aneurysm diameter is more related to technical limitations, given his significant amount of bowel gas. However, with the AneuRx graft, there is the lack of positive fixation. I feel this needs to be further evaluated. I have scheduled him to come back for a repeat abdominal ultrasound in 6 months. If again these numbers are replicated, he'll need a CT scan to better evaluate his aneurysm. He is in full understanding and agreement with this plan.  Jorge Ny, M.D. [facility] [place] Office: [phone] Pager:  [phone]

## 2012-09-29 NOTE — Addendum Note (Signed)
Addended by: Sharee Pimple on: 09/29/2012 01:52 PM   Modules accepted: Orders

## 2012-10-20 ENCOUNTER — Encounter: Payer: Self-pay | Admitting: Cardiology

## 2012-10-21 ENCOUNTER — Encounter: Payer: Self-pay | Admitting: Cardiology

## 2012-11-12 LAB — CBC
Hemoglobin: 15 g/dL (ref 13.0–17.0)
MCH: 32.1 pg (ref 26.0–34.0)
MCHC: 34.6 g/dL (ref 30.0–36.0)
MCV: 92.7 fL (ref 78.0–100.0)
RBC: 4.68 MIL/uL (ref 4.22–5.81)

## 2012-11-12 LAB — BASIC METABOLIC PANEL
BUN: 22 mg/dL (ref 6–23)
Calcium: 9.2 mg/dL (ref 8.4–10.5)
Creat: 1.26 mg/dL (ref 0.50–1.35)
Glucose, Bld: 106 mg/dL — ABNORMAL HIGH (ref 70–99)

## 2012-11-12 LAB — HEPATIC FUNCTION PANEL
ALT: 19 U/L (ref 0–53)
AST: 29 U/L (ref 0–37)
Albumin: 3.7 g/dL (ref 3.5–5.2)
Alkaline Phosphatase: 81 U/L (ref 39–117)
Indirect Bilirubin: 1.7 mg/dL — ABNORMAL HIGH (ref 0.0–0.9)
Total Protein: 6.2 g/dL (ref 6.0–8.3)

## 2012-11-12 LAB — LIPID PANEL
Cholesterol: 138 mg/dL (ref 0–200)
Total CHOL/HDL Ratio: 3.5 Ratio
Triglycerides: 123 mg/dL (ref <150)

## 2012-11-18 ENCOUNTER — Ambulatory Visit: Payer: Medicare Other | Admitting: Family Medicine

## 2012-11-21 ENCOUNTER — Encounter: Payer: Self-pay | Admitting: Family Medicine

## 2012-11-21 ENCOUNTER — Ambulatory Visit (INDEPENDENT_AMBULATORY_CARE_PROVIDER_SITE_OTHER): Payer: Medicare Other | Admitting: Family Medicine

## 2012-11-21 ENCOUNTER — Telehealth: Payer: Self-pay | Admitting: Family Medicine

## 2012-11-21 VITALS — BP 110/60 | HR 73 | Temp 98.0°F | Ht 66.5 in

## 2012-11-21 DIAGNOSIS — Z8612 Personal history of poliomyelitis: Secondary | ICD-10-CM

## 2012-11-21 DIAGNOSIS — E785 Hyperlipidemia, unspecified: Secondary | ICD-10-CM

## 2012-11-21 DIAGNOSIS — E079 Disorder of thyroid, unspecified: Secondary | ICD-10-CM

## 2012-11-21 DIAGNOSIS — Z733 Stress, not elsewhere classified: Secondary | ICD-10-CM

## 2012-11-21 DIAGNOSIS — F439 Reaction to severe stress, unspecified: Secondary | ICD-10-CM

## 2012-11-21 DIAGNOSIS — I10 Essential (primary) hypertension: Secondary | ICD-10-CM

## 2012-11-21 NOTE — Patient Instructions (Addendum)
Labs prior lipid, renal, hepatic, cbc, tsh For the back pain heat pads, 1 Aleve with food as needed and Tylenol/Acetaminophen 500 mg 2 twice day Cholesterol Cholesterol is a white, waxy, fat-like protein needed by your body in small amounts. The liver makes all the cholesterol you need. It is carried from the liver by the blood through the blood vessels. Deposits (plaque) may build up on blood vessel walls. This makes the arteries narrower and stiffer. Plaque increases the risk for heart attack and stroke. You cannot feel your cholesterol level even if it is very high. The only way to know is by a blood test to check your lipid (fats) levels. Once you know your cholesterol levels, you should keep a record of the test results. Work with your caregiver to to keep your levels in the desired range. WHAT THE RESULTS MEAN:  Total cholesterol is a rough measure of all the cholesterol in your blood.  LDL is the so-called bad cholesterol. This is the type that deposits cholesterol in the walls of the arteries. You want this level to be low.  HDL is the good cholesterol because it cleans the arteries and carries the LDL away. You want this level to be high.  Triglycerides are fat that the body can either burn for energy or store. High levels are closely linked to heart disease. DESIRED LEVELS:  Total cholesterol below 200.  LDL below 100 for people at risk, below 70 for very high risk.  HDL above 50 is good, above 60 is best.  Triglycerides below 150. HOW TO LOWER YOUR CHOLESTEROL:  Diet.  Choose fish or white meat chicken and [place], roasted or baked. Limit fatty cuts of red meat, fried foods, and processed meats, such as sausage and lunch meat.  Eat lots of fresh fruits and vegetables. Choose whole grains, beans, pasta, potatoes and cereals.  Use only small amounts of olive, corn or canola oils. Avoid butter, mayonnaise, shortening or palm kernel oils. Avoid foods with trans-fats.  Use  skim/nonfat milk and low-fat/nonfat yogurt and cheeses. Avoid whole milk, cream, ice cream, egg yolks and cheeses. Healthy desserts include angel food cake, ginger snaps, animal crackers, hard candy, popsicles, and low-fat/nonfat frozen yogurt. Avoid pastries, cakes, pies and cookies.  Exercise.  A regular program helps decrease LDL and raises HDL.  Helps with weight control.  Do things that increase your activity level like gardening, walking, or taking the stairs.  Medication.  May be prescribed by your caregiver to help lowering cholesterol and the risk for heart disease.  You may need medicine even if your levels are normal if you have several risk factors. HOME CARE INSTRUCTIONS   Follow your diet and exercise programs as suggested by your caregiver.  Take medications as directed.  Have blood work done when your caregiver feels it is necessary. MAKE SURE YOU:   Understand these instructions.  Will watch your condition.  Will get help right away if you are not doing well or get worse. Document Released: 04/03/2001 Document Revised: 10/01/2011 Document Reviewed: 09/24/2007 [facility] Patient Information 2013 [place], [place].

## 2012-11-21 NOTE — Telephone Encounter (Signed)
Labs prior lipid, renal, hepatic, cbc, tsh  Patient scheduled appointment for 05/26/13. He will be going to [facility] lab

## 2012-11-22 NOTE — Assessment & Plan Note (Signed)
On Lovastatin 80 mg daily, avoid trans fats, continue Zetia

## 2012-11-22 NOTE — Progress Notes (Signed)
Patient ID: [name] [name], male   DOB: 1923-06-04, 79 y.o.   MRN: [id number] [name] [name] [id number] August 08, 1922 11/22/2012      Progress Note-Follow Up  Subjective  Chief Complaint  Chief Complaint  Patient presents with  . Follow-up    2 month    HPI  Patient is a 79 year old Caucasian male who is in today for followup. His major concern today is that his wife. His wife has advancing dementia and is becoming increasingly difficult to manage. He has no significant support system so is very stressed and given with her angry outbursts and refusal to return for medical care.  He generally is physically well. He acknowledges some deconditioning and because it was needed to care for his wife is exercising routinely. He has a distant history of polio with weakness in his legs which he notes is recently getting worse. No chest pain or palpitations. No recent illness fevers GI or GU complaints.  Past Medical History  Diagnosis Date  . CVA (cerebral infarction) 12/09  . CAD (coronary artery disease)     stents in LXC and has a totally occluded RCA  . HTN (hypertension)   . Right iliac artery stenosis   . BPH (benign prostatic hyperplasia)   . Aortic stenosis     s/p AVR in 2003, bovine  . Diverticulosis   . AAA (abdominal aortic aneurysm)     s/p endovascular repair 2004  . Hyperlipemia   . Thyroid disease   . Glaucoma(365)     recently told by Dr Eulah Pont he did not have it  . BCC (basal cell carcinoma) 09/17/2012  . Personal history of poliomyelitis 09/17/2012    In childhood    Past Surgical History  Procedure Laterality Date  . Aortic valve replacement  2003    #77mm pericardial valve   . Abdominal aortic aneurysm repair  2004    s/p endovascular repiar of AAA in 2004    Family History  Problem Relation Age of Onset  . Colon cancer Mother   . Aneurysm Father   . Heart attack Brother     CHF  . Cancer Brother     prostate    History   Social History  . Marital  Status: Married    Spouse Name: N/A    Number of Children: N/A  . Years of Education: N/A   Occupational History  . Not on file.   Social History Main Topics  . Smoking status: Former Smoker    Types: Cigarettes    Quit date: 10/04/1980  . Smokeless tobacco: Not on file  . Alcohol Use: Yes     Comment: SOCIAL ALCOHOL  . Drug Use: No  . Sexually Active: Not on file   Other Topics Concern  . Not on file   Social History Narrative  . No narrative on file    Current Outpatient Prescriptions on File Prior to Visit  Medication Sig Dispense Refill  . atorvastatin (LIPITOR) 80 MG tablet Take 80 mg by mouth daily.        . B Complex-C (SUPER B COMPLEX) TABS Take 1 tablet by mouth daily.      . betamethasone dipropionate (DIPROLENE) 0.05 % cream       . finasteride (PROSCAR) 5 MG tablet Take 5 mg by mouth daily.        Marland Kitchen levothyroxine (SYNTHROID, LEVOTHROID) 50 MCG tablet Take 50 mcg by mouth daily.        Marland Kitchen losartan (  COZAAR) 50 MG tablet Take 50 mg by mouth daily.        Marland Kitchen PARoxetine (PAXIL) 10 MG tablet Take 10 mg by mouth every morning.        Marland Kitchen PLACEBO PO Take by mouth. 45 mg daily- research      . ZETIA 10 MG tablet        No current facility-administered medications on file prior to visit.    Allergies  Allergen Reactions  . Amoxicillin-Pot Clavulanate   . Morphine And Related Nausea And Vomiting  . Penicillins Hives    Review of Systems  Review of Systems  Constitutional: Negative for fever and malaise/fatigue.  HENT: Negative for congestion.   Eyes: Negative for discharge.  Respiratory: Negative for shortness of breath.   Cardiovascular: Negative for chest pain, palpitations and leg swelling.  Gastrointestinal: Negative for nausea, abdominal pain and diarrhea.  Genitourinary: Negative for dysuria.  Musculoskeletal: Positive for myalgias and back pain. Negative for falls.  Skin: Negative for rash.  Neurological: Negative for loss of consciousness and headaches.   Endo/Heme/Allergies: Negative for polydipsia.  Psychiatric/Behavioral: Negative for depression and suicidal ideas. The patient is not nervous/anxious and does not have insomnia.     Objective  BP 110/60  Pulse 73  Temp(Src) 98 F (36.7 C) (Oral)  Ht 5' 6.5" (1.689 m)  SpO2 94%  Physical Exam  Physical Exam  Constitutional: He is oriented to person, place, and time and well-developed, well-nourished, and in no distress. No distress.  HENT:  Head: Normocephalic and atraumatic.  Eyes: Conjunctivae are normal.  Neck: Neck supple. No thyromegaly present.  Cardiovascular: Normal rate, regular rhythm and normal heart sounds.   No murmur heard. Pulmonary/Chest: Effort normal and breath sounds normal. No respiratory distress.  Abdominal: He exhibits no distension and no mass. There is no tenderness.  Musculoskeletal: He exhibits no edema.  Neurological: He is alert and oriented to person, place, and time.  Skin: Skin is warm.  Psychiatric: Memory, affect and judgment normal.    Lab Results  Component Value Date   TSH 3.761 11/11/2012   Lab Results  Component Value Date   WBC 6.2 11/11/2012   HGB 15.0 11/11/2012   HCT 43.4 11/11/2012   MCV 92.7 11/11/2012   PLT 148* 11/11/2012   Lab Results  Component Value Date   CREATININE 1.26 11/11/2012   BUN 22 11/11/2012   NA 141 11/11/2012   K 4.6 11/11/2012   CL 107 11/11/2012   CO2 27 11/11/2012   Lab Results  Component Value Date   ALT 19 11/11/2012   AST 29 11/11/2012   ALKPHOS 81 11/11/2012   BILITOT 2.0* 11/11/2012   Lab Results  Component Value Date   CHOL 138 11/11/2012   Lab Results  Component Value Date   HDL 40 11/11/2012   Lab Results  Component Value Date   LDLCALC 73 11/11/2012   Lab Results  Component Value Date   TRIG 123 11/11/2012   Lab Results  Component Value Date   CHOLHDL 3.5 11/11/2012     Assessment & Plan  HTN (hypertension) Well controlled, no changes to meds today  Personal history of  poliomyelitis Notes some increased trouble with legs during ambulation recently  Stress His wife is struggling with progressive dementia and he is her primary care provider, does not have a support system in place. He is hesitant to ask their children for help. He acknowledges she is getting angry and more difficult to manage. He  declines any medication assistance.  Hyperlipemia On Lovastatin 80 mg daily, avoid trans fats, continue Zetia  Thyroid disease TSH within normal limits. No changes.

## 2012-11-22 NOTE — Assessment & Plan Note (Signed)
Notes some increased trouble with legs during ambulation recently

## 2012-11-22 NOTE — Assessment & Plan Note (Signed)
His wife is struggling with progressive dementia and he is her primary care provider, does not have a support system in place. He is hesitant to ask their children for help. He acknowledges she is getting angry and more difficult to manage. He declines any medication assistance.

## 2012-11-22 NOTE — Assessment & Plan Note (Signed)
TSH within normal limits. No changes.

## 2012-11-22 NOTE — Assessment & Plan Note (Signed)
Well controlled, no changes to meds today 

## 2013-02-02 ENCOUNTER — Telehealth: Payer: Self-pay | Admitting: Family Medicine

## 2013-02-02 MED ORDER — PAROXETINE HCL 10 MG PO TABS: 10.0000 mg | ORAL_TABLET | ORAL | Status: DC

## 2013-02-02 MED ORDER — LEVOTHYROXINE SODIUM 50 MCG PO TABS: 50.0000 ug | ORAL_TABLET | Freq: Every day | ORAL | Status: DC

## 2013-02-02 MED ORDER — ATORVASTATIN CALCIUM 80 MG PO TABS: 80.0000 mg | ORAL_TABLET | Freq: Every day | ORAL | Status: DC

## 2013-02-02 NOTE — Telephone Encounter (Signed)
Patient walked into the office stating that he needs refills of Paroxetine 20mg . Take one daily. 90 days supply with 3 refills and Levothyroxine . Take one daily. 90 days supply with 3 refills to be sent to [facility] pharmacy.  Also, patient wants atorvastatin 80mg  tab. 90 days supply with 3 refills. Take one daily to be sent to [facility] home delivery.

## 2013-02-06 ENCOUNTER — Other Ambulatory Visit: Payer: Self-pay | Admitting: *Deleted

## 2013-02-06 MED ORDER — LOSARTAN POTASSIUM 50 MG PO TABS: 50.0000 mg | ORAL_TABLET | Freq: Every day | ORAL | Status: DC

## 2013-02-06 MED ORDER — FINASTERIDE 5 MG PO TABS: 5.0000 mg | ORAL_TABLET | Freq: Every day | ORAL | Status: DC

## 2013-02-06 MED ORDER — EZETIMIBE 10 MG PO TABS: 10.0000 mg | ORAL_TABLET | Freq: Every day | ORAL | Status: DC

## 2013-02-06 MED ORDER — ATORVASTATIN CALCIUM 80 MG PO TABS: 80.0000 mg | ORAL_TABLET | Freq: Every day | ORAL | Status: DC

## 2013-02-09 ENCOUNTER — Telehealth: Payer: Self-pay | Admitting: Family Medicine

## 2013-02-09 NOTE — Telephone Encounter (Signed)
Patient walked into the office stating that paroxetine and levothyroxine were supposed to be sent to [facility] pharmacy not [place] home delivery.  Also, patient states that paroxetine is supposed to be 20mg , not 10mg . He would like clarification for this. He walked in with a prescription for 20mg  dated 3.28.14. He states that he has only taken 20mg  of paroxetine. He would also like to know if you had left him a message on his phone because he states that he could not understand what was being said. Patient is very upset over this and would like to be called.

## 2013-02-09 NOTE — Telephone Encounter (Signed)
The patient showed an RX to [place] that was wrote in March 14 for 20 mg?

## 2013-02-09 NOTE — Telephone Encounter (Signed)
His Paroxetine was at 10 mg daily when he was last here, double check with him and make sure he has not increased it if he has then he could have an rx for the 20 mg tab daily but I would need to see him in follow up in 8-12 weeks

## 2013-02-09 NOTE — Telephone Encounter (Signed)
Ok first thing, if you look under medications the Paxil and Levothyroxine were sent to [facility] and the Lipitor was sent to [place] like requested.....   Dr Abner Greenspan can you please verify which does of medication the Paxil is supposed to be?

## 2013-02-10 NOTE — Telephone Encounter (Signed)
Patient states that his heart doctor gave him this. Pt states that dr Modesto Charon gives him this RX. I informed patient that all we have in the computer is the 10 mg and I asked him if he has tried to contact Dr Modesto Charon? Pt got upset and said that he told Dr Abner Greenspan all this information and doesn't understand why she doesn't have it right? Pt stated that it looks like he is going to have to switch doctors. I informed pt that if he feels that would be the best for his care. Patient then hung up on me   No one at [facility] has ever filled this according to our records. It does look like when he came into the office on July 14,14 that he asks Judeth Cornfield to have 20 mg refilled?

## 2013-02-10 NOTE — Telephone Encounter (Signed)
Sorry he is frustrated, if he chooses to stick with [place] then we can prescribe either dose I just need to know which dose he has continued to take it sounds like it is the 20 mg dose which is fine

## 2013-02-11 MED ORDER — PAROXETINE HCL 20 MG PO TABS: 20.0000 mg | ORAL_TABLET | ORAL | Status: DC

## 2013-02-11 NOTE — Telephone Encounter (Signed)
Sorry for the confusion but I am willing to give him a 2 month supply of what ever dose he says he has been taking to give him time to find a new PMD. He just needs to know that if he is taking 20 mg and he decides to stop then he should split it in 1/2 and only take 10 mg daily for 2 weeks then stop

## 2013-02-11 NOTE — Telephone Encounter (Signed)
FYI: I informed pt and told him he would need to schedule an appt in 8-12 weeks for a follow up.  Pt then stated he wasn't going to schedule another appt. I said ok and he stated are you not going to send this in then and I said no because we need to schedule a follow up in 8-12 weeks and pt stated he is not scheduling another follow up because he has to pay $295.   I said I was sorry and we said goodbye

## 2013-02-11 NOTE — Telephone Encounter (Signed)
Pt informed and stated that he was always given a 90 day supply before. I explained to patient that we could give him this amount and if he wanted to make an office visit to see MD in 8-12 weeks they could discuss a 90 day supply but since he wasn't going to schedule an appt with MD we could only give him a 2 month supply to get him through until he gets a new md

## 2013-04-03 ENCOUNTER — Encounter: Payer: Self-pay | Admitting: Surgery

## 2013-04-06 ENCOUNTER — Encounter: Payer: Self-pay | Admitting: Surgery

## 2013-04-06 ENCOUNTER — Encounter (INDEPENDENT_AMBULATORY_CARE_PROVIDER_SITE_OTHER): Payer: Medicare Other | Admitting: *Deleted

## 2013-04-06 ENCOUNTER — Ambulatory Visit (INDEPENDENT_AMBULATORY_CARE_PROVIDER_SITE_OTHER): Payer: Medicare Other | Admitting: Surgery

## 2013-04-06 VITALS — BP 171/43 | HR 63 | Ht 66.5 in | Wt 183.0 lb

## 2013-04-06 DIAGNOSIS — I714 Abdominal aortic aneurysm, without rupture: Secondary | ICD-10-CM

## 2013-04-06 DIAGNOSIS — Z48812 Encounter for surgical aftercare following surgery on the circulatory system: Secondary | ICD-10-CM

## 2013-04-06 NOTE — Addendum Note (Signed)
Addended by: Adria Dill L on: 04/06/2013 04:44 PM   Modules accepted: Orders

## 2013-04-06 NOTE — Progress Notes (Signed)
[facility] [place]   Patient name: [name] [name] MRN: [id number] DOB: 03/30/1923 Sex: male     Chief Complaint  Patient presents with  . Re-evaluation    repeat ultrasound to evaluate AAA    HISTORY OF PRESENT ILLNESS: The patient is back today for followup. He is status post endovascular repair of abdominal aortic aneurysm by Dr. Madilyn Fireman in 2004 using a AneuRx stent graft. He has no complaints. He denies back pain. He denies abdominal pain. He stated that his wife now has dementia and taking care of her has become a significant challenge for him. His most recent ultrasound suggested a significant change in the diameter of his aneurysm sac. Therefore I brought him back today which is 6 months later to repeat the study as I suspected that the previous study was inaccurate.   Past Medical History  Diagnosis Date  . CVA (cerebral infarction) 12/09  . CAD (coronary artery disease)     stents in LXC and has a totally occluded RCA  . HTN (hypertension)   . Right iliac artery stenosis   . BPH (benign prostatic hyperplasia)   . Aortic stenosis     s/p AVR in 2003, bovine  . Diverticulosis   . AAA (abdominal aortic aneurysm)     s/p endovascular repair 2004  . Hyperlipemia   . Thyroid disease   . Glaucoma     recently told by Dr Eulah Pont he did not have it  . BCC (basal cell carcinoma) 09/17/2012  . Personal history of poliomyelitis 09/17/2012    In childhood    Past Surgical History  Procedure Laterality Date  . Aortic valve replacement  2003    #83mm pericardial valve   . Abdominal aortic aneurysm repair  2004    s/p endovascular repiar of AAA in 2004    History   Social History  . Marital Status: Married    Spouse Name: N/A    Number of Children: N/A  . Years of Education: N/A   Occupational History  . Not on file.   Social History Main Topics  . Smoking status: Former Smoker    Types: Cigarettes    Quit date: 10/04/1980  . Smokeless tobacco:  Not on file  . Alcohol Use: 8.4 oz/week    14 Glasses of wine per week     Comment: cocktails  . Drug Use: No  . Sexual Activity: Not on file   Other Topics Concern  . Not on file   Social History Narrative  . No narrative on file    Family History  Problem Relation Age of Onset  . Colon cancer Mother   . Aneurysm Father   . Heart attack Brother     CHF  . Cancer Brother     prostate    Allergies as of 04/06/2013 - Review Complete 04/06/2013  Allergen Reaction Noted  . Amoxicillin-pot clavulanate  10/16/2010  . Morphine and related Nausea And Vomiting 10/05/2010  . Penicillins Hives 10/05/2010    Current Outpatient Prescriptions on File Prior to Visit  Medication Sig Dispense Refill  . atorvastatin (LIPITOR) 80 MG tablet Take 1 tablet (80 mg total) by mouth daily.  90 tablet  1  . B Complex-C (SUPER B COMPLEX) TABS Take 1 tablet by mouth daily.      . betamethasone dipropionate (DIPROLENE) 0.05 % cream       . ezetimibe (ZETIA) 10 MG tablet Take 1 tablet (10 mg total) by mouth  daily.  90 tablet  1  . finasteride (PROSCAR) 5 MG tablet Take 1 tablet (5 mg total) by mouth daily.  90 tablet  1  . levothyroxine (SYNTHROID, LEVOTHROID) 50 MCG tablet Take 1 tablet (50 mcg total) by mouth daily.  90 tablet  3  . losartan (COZAAR) 50 MG tablet Take 1 tablet (50 mg total) by mouth daily.  90 tablet  1  . PARoxetine (PAXIL) 20 MG tablet Take 1 tablet (20 mg total) by mouth every morning.  30 tablet  1  . PLACEBO PO Take by mouth. 45 mg daily- research       No current facility-administered medications on file prior to visit.     REVIEW OF SYSTEMS: All systems negative  PHYSICAL EXAMINATION:   Vital signs are BP 171/43  Pulse 63  Ht 5' 6.5" (1.689 m)  Wt 183 lb (83.008 kg)  BMI 29.1 kg/m2  SpO2 98% General: The patient appears their stated age. HEENT:  No gross abnormalities Pulmonary:  Non labored breathing Abdomen: Soft and non-tender Musculoskeletal: There are no  major deformities. Neurologic: No focal weakness or paresthesias are detected, Skin: There are no ulcer or rashes noted. Psychiatric: The patient has normal affect. Cardiovascular: There is a regular rate and rhythm without significant murmur appreciated. No carotid bruits   Diagnostic Studies Duplex ultrasound was ordered and reviewed. Maximum diameter is 3.4 cm which is different from the prior study and more consistent with his previous studies  Assessment: Status post endovascular repair Plan: The patient's aneurysm continues to decrease in size. I suspect the study 6 months ago was inaccurate. He will follow up in one year with a repeat ultrasound.  Jorge Ny, M.D. [facility] [place] Office: [phone] Pager:  [phone]

## 2013-04-10 ENCOUNTER — Telehealth: Payer: Self-pay | Admitting: Nurse Practitioner

## 2013-04-10 NOTE — Telephone Encounter (Signed)
Ok to give appointment with me for sometime in October.

## 2013-04-10 NOTE — Telephone Encounter (Signed)
New Problem  Pt was a previous pt of Dr. Tennant/// and is in search of primary care referral/// No medications and No provider to write their scripts// Wants to speak with Lawson Fiscal for advise.

## 2013-04-10 NOTE — Telephone Encounter (Signed)
S/W pt stated wanted to come in to see Norma Fredrickson, NP Lawson Fiscal s/w pt I made appt for both him and his wife to come in October 10 @ 1:30 and 2:00

## 2013-04-10 NOTE — Telephone Encounter (Signed)
I spoke with the [name] [name] and he and [name] [name] wife Braxen Dobek ([id number]) would like to be scheduled for an appointment with Norma Fredrickson NP.  The [name] [name] said that he will no longer be following up with Dr Rogelia Rohrer for Primary Care. The [name] [name] currently has [name] [name] medications and would like to see Lawson Fiscal for a cardiology check-up and discuss referral to a new PCP.  I offered the [name] [name] and [name] [name] wife and appointment on 05/05/13 at 9:00 and 9:30 and the [name] [name] said this is to early and prefers an afternoon slot. Due to held spots on the schedule I will forward this message to Duwayne Heck to contact the [name] [name] with appointments.

## 2013-04-10 NOTE — Telephone Encounter (Signed)
Ok to give an appointment for sometime in October with me.

## 2013-05-01 ENCOUNTER — Telehealth: Payer: Self-pay | Admitting: Family Medicine

## 2013-05-01 ENCOUNTER — Encounter: Payer: Self-pay | Admitting: Nurse Practitioner

## 2013-05-01 ENCOUNTER — Ambulatory Visit (INDEPENDENT_AMBULATORY_CARE_PROVIDER_SITE_OTHER): Payer: Medicare Other | Admitting: Nurse Practitioner

## 2013-05-01 VITALS — BP 140/70 | HR 76 | Ht 66.5 in | Wt 184.8 lb

## 2013-05-01 DIAGNOSIS — I259 Chronic ischemic heart disease, unspecified: Secondary | ICD-10-CM

## 2013-05-01 DIAGNOSIS — E039 Hypothyroidism, unspecified: Secondary | ICD-10-CM

## 2013-05-01 LAB — BASIC METABOLIC PANEL
BUN: 26 mg/dL — ABNORMAL HIGH (ref 6–23)
CO2: 30 mEq/L (ref 19–32)
Calcium: 9.3 mg/dL (ref 8.4–10.5)
Chloride: 108 mEq/L (ref 96–112)
Creatinine, Ser: 1.3 mg/dL (ref 0.4–1.5)
GFR: 57.43 mL/min — ABNORMAL LOW (ref >60.00)
Glucose, Bld: 97 mg/dL (ref 70–99)
Potassium: 4.3 mEq/L (ref 3.5–5.1)
Sodium: 147 mEq/L — ABNORMAL HIGH (ref 135–145)

## 2013-05-01 LAB — HEPATIC FUNCTION PANEL
ALT: 18 U/L (ref 0–53)
AST: 23 U/L (ref 0–37)
Albumin: 3.7 g/dL (ref 3.5–5.2)
Alkaline Phosphatase: 75 U/L (ref 39–117)
Bilirubin, Direct: 0.3 mg/dL (ref 0.0–0.3)
Total Bilirubin: 2.2 mg/dL — ABNORMAL HIGH (ref 0.3–1.2)
Total Protein: 6.3 g/dL (ref 6.0–8.3)

## 2013-05-01 LAB — LIPID PANEL
Cholesterol: 130 mg/dL (ref 0–200)
HDL: 40.7 mg/dL (ref >39.00)
LDL Cholesterol: 70 mg/dL (ref 0–99)
Total CHOL/HDL Ratio: 3
Triglycerides: 99 mg/dL (ref 0.0–149.0)
VLDL: 19.8 mg/dL (ref 0.0–40.0)

## 2013-05-01 LAB — TSH: TSH: 2.69 u[IU]/mL (ref 0.35–5.50)

## 2013-05-01 NOTE — Progress Notes (Signed)
[name] [name] Date of Birth: Feb 17, 1923 Medical Record #[id number]  History of Present Illness: [name] [name] is seen back today for a follow up visit. It is an almost 2 1/2 year check. Former patient of Dr. Ronnald Nian. Now 79 years old. Has known CAD with past stent to the LCX and a totally occluded RCA and prior AVR in 2003, past AAA repair in 2004, PVD, past CVA, HLD, BPH, and hypothyroidism. Previously seen by Dr. Modesto Charon for primary care but transportation issues have made that difficult. He had his last echo in 2009 and had opted to have NO further echocardiograms.   Last seen in July of 2012. Doing ok but he had assumed total care of the house - [name] [name] has progressive dementia.   Comes back today. Here with [name] [name]. He has been doing ok. No chest pain. Some DOE. He is doing all the care in the house. Has had 2 recent falls - both mechanical - tripped up on a curb. No significant injury. No syncope reported. Feels ok on his medicines. Has had no labs - probably since the last time he was here. No longer has a PCP.   Current Outpatient Prescriptions  Medication Sig Dispense Refill  . atorvastatin (LIPITOR) 80 MG tablet Take 1 tablet (80 mg total) by mouth daily.  90 tablet  1  . B Complex-C (SUPER B COMPLEX) TABS Take 1 tablet by mouth daily.      . betamethasone dipropionate (DIPROLENE) 0.05 % cream       . ezetimibe (ZETIA) 10 MG tablet Take 1 tablet (10 mg total) by mouth daily.  90 tablet  1  . finasteride (PROSCAR) 5 MG tablet Take 1 tablet (5 mg total) by mouth daily.  90 tablet  1  . levothyroxine (SYNTHROID, LEVOTHROID) 50 MCG tablet Take 1 tablet (50 mcg total) by mouth daily.  90 tablet  3  . losartan (COZAAR) 50 MG tablet Take 1 tablet (50 mg total) by mouth daily.  90 tablet  1  . PARoxetine (PAXIL) 20 MG tablet Take 1 tablet (20 mg total) by mouth every morning.  30 tablet  1  . PLACEBO PO Take by mouth. 45 mg daily- research       No current facility-administered medications for  this visit.    Allergies  Allergen Reactions  . Amoxicillin-Pot Clavulanate   . Morphine And Related Nausea And Vomiting  . Penicillins Hives    Past Medical History  Diagnosis Date  . CVA (cerebral infarction) 12/09  . CAD (coronary artery disease)     stents in LXC and has a totally occluded RCA  . HTN (hypertension)   . Right iliac artery stenosis   . BPH (benign prostatic hyperplasia)   . Aortic stenosis     s/p AVR in 2003, bovine  . Diverticulosis   . AAA (abdominal aortic aneurysm)     s/p endovascular repair 2004  . Hyperlipemia   . Thyroid disease   . Glaucoma     recently told by Dr Eulah Pont he did not have it  . BCC (basal cell carcinoma) 09/17/2012  . Personal history of poliomyelitis 09/17/2012    In childhood    Past Surgical History  Procedure Laterality Date  . Aortic valve replacement  2003    #43mm pericardial valve   . Abdominal aortic aneurysm repair  2004    s/p endovascular repiar of AAA in 2004    History  Smoking status  . Former Smoker  .  Types: Cigarettes  . Quit date: 10/04/1980  Smokeless tobacco  . Not on file    History  Alcohol Use  . 8.4 oz/week  . 14 Glasses of wine per week    Comment: cocktails    Family History  Problem Relation Age of Onset  . Colon cancer Mother   . Aneurysm Father   . Heart attack Brother     CHF  . Cancer Brother     prostate    Review of Systems: The review of systems is per the HPI.  All other systems were reviewed and are negative.  Physical Exam: BP 140/70  Pulse 76  Ht 5' 6.5" (1.689 m)  Wt 184 lb 12.8 oz (83.825 kg)  BMI 29.38 kg/m2 Patient is very pleasant and in no acute distress. Skin is warm and dry. Color is normal.  HEENT is unremarkable. Normocephalic/atraumatic. PERRL. Sclera are nonicteric. Neck is supple. No masses. No JVD. Lungs are clear. Cardiac exam shows a regular rate and rhythm. Harsh high pitched outflow murmur noted. Abdomen is soft. Extremities are without edema.  Gait and ROM are intact. No gross neurologic deficits noted.  LABORATORY DATA: PENDING  Lab Results  Component Value Date   WBC 6.2 11/11/2012   HGB 15.0 11/11/2012   HCT 43.4 11/11/2012   PLT 148* 11/11/2012   GLUCOSE 106* 11/11/2012   CHOL 138 11/11/2012   TRIG 123 11/11/2012   HDL 40 11/11/2012   LDLCALC 73 11/11/2012   ALT 19 11/11/2012   AST 29 11/11/2012   NA 141 11/11/2012   K 4.6 11/11/2012   CL 107 11/11/2012   CREATININE 1.26 11/11/2012   BUN 22 11/11/2012   CO2 27 11/11/2012   TSH 3.761 11/11/2012   INR 1.1 07/03/2008   HGBA1C  Value: 6.2 (NOTE)   The ADA recommends the following therapeutic goal for glycemic   control related to Hgb A1C measurement:   Goal of Therapy:   < 7.0% Hgb A1C   Reference: American Diabetes Association: Clinical Practice   Recommendations 2008, Diabetes Care,  2008, 31:(Suppl 1).* 07/03/2008    Assessment / Plan:  1. CAD - no chest pain reported.   2. Remote AVR - he is agreeable to updating his echo - mostly so we know where we stand with things - he needs to stay in the best shape possible since he is the primary care giver.   3. HLD  4. Hypothyroidism  We will recheck his labs. Arrange for an echo. I will see back in 6 months tentatively. Try to get them both to primary care at [facility]. Has had a flu shot.   Patient is agreeable to this plan and will call if any problems develop in the interim.   [name] Macadamia, RN, ANP-C [facility] [address] [place], [place]  [address]

## 2013-05-01 NOTE — Telephone Encounter (Signed)
Left message for patient to return my call. Patient spoke to [place] at [facility] and told her that he wanted to transfer to the [facility] office. The physicians at that office do not take new medicare. Will offer patient to be seen at another [place] office.

## 2013-05-01 NOTE — Patient Instructions (Addendum)
We need to check labs today  We are going to get an ultrasound of your heart   Stay on your current medicines  I will see you in 6 months  We will try to get you to a new PCP - [place] at [facility]  Call the [facility] office at [phone] if you have any questions, problems or concerns.

## 2013-05-05 ENCOUNTER — Ambulatory Visit (HOSPITAL_COMMUNITY): Payer: Medicare Other | Attending: Nurse Practitioner

## 2013-05-05 DIAGNOSIS — I2510 Atherosclerotic heart disease of native coronary artery without angina pectoris: Secondary | ICD-10-CM | POA: Insufficient documentation

## 2013-05-05 DIAGNOSIS — R0989 Other specified symptoms and signs involving the circulatory and respiratory systems: Secondary | ICD-10-CM | POA: Insufficient documentation

## 2013-05-05 DIAGNOSIS — Z954 Presence of other heart-valve replacement: Secondary | ICD-10-CM | POA: Insufficient documentation

## 2013-05-05 DIAGNOSIS — I2589 Other forms of chronic ischemic heart disease: Secondary | ICD-10-CM | POA: Insufficient documentation

## 2013-05-05 DIAGNOSIS — Z8673 Personal history of transient ischemic attack (TIA), and cerebral infarction without residual deficits: Secondary | ICD-10-CM | POA: Insufficient documentation

## 2013-05-05 DIAGNOSIS — E039 Hypothyroidism, unspecified: Secondary | ICD-10-CM

## 2013-05-05 DIAGNOSIS — E785 Hyperlipidemia, unspecified: Secondary | ICD-10-CM | POA: Insufficient documentation

## 2013-05-05 DIAGNOSIS — R0609 Other forms of dyspnea: Secondary | ICD-10-CM | POA: Insufficient documentation

## 2013-05-05 DIAGNOSIS — I359 Nonrheumatic aortic valve disorder, unspecified: Secondary | ICD-10-CM | POA: Insufficient documentation

## 2013-05-05 DIAGNOSIS — I259 Chronic ischemic heart disease, unspecified: Secondary | ICD-10-CM

## 2013-05-05 DIAGNOSIS — Z952 Presence of prosthetic heart valve: Secondary | ICD-10-CM

## 2013-05-05 NOTE — Progress Notes (Signed)
Echocardiogram performed.  

## 2013-05-08 ENCOUNTER — Telehealth: Payer: Self-pay | Admitting: Nurse Practitioner

## 2013-05-08 NOTE — Telephone Encounter (Signed)
New problem        Pt called and stated he was given this number and ex [phone].   Pt would not leave a message.

## 2013-05-08 NOTE — Telephone Encounter (Signed)
Pt called upset because [facility] wont take him as a new pt because he has medicare and wont get pain all that they charge. Pt states he will find a PCP on his own.

## 2013-05-12 ENCOUNTER — Telehealth: Payer: Self-pay | Admitting: *Deleted

## 2013-05-12 NOTE — Telephone Encounter (Signed)
S/w pt tried to call [facility] about wife's medication wouldn't speak with him about hctz, I s/w Lawson Fiscal and stated give the mail order a few more days it usually takes 7 to 10  business day,  medication was sent in on 10/14. Pt also stated having a hard time breathing but this has been going on for quite some time and pt stated slept last night

## 2013-05-12 NOTE — Telephone Encounter (Signed)
Through the night but sometimes gasp for air while sleeping asked if could possibly be a hiatial hernia needs to see a PCP will send to scheduling to make appointment. I will call pt back

## 2013-05-12 NOTE — Telephone Encounter (Signed)
S/w pt came to a verbal agreement will wait till the last week in October for pt to get hctz if pt does not receive to call me and I will try and get Mr. Marchuk in to see a PCP near [place] our office will be calling pt back

## 2013-05-12 NOTE — Telephone Encounter (Signed)
S/w pt is agreeable to plan to see Dr. Marcelline Mates on 05/20/13 @1 :30 at the [facility]

## 2013-05-13 ENCOUNTER — Telehealth: Payer: Self-pay | Admitting: Family Medicine

## 2013-05-13 NOTE — Telephone Encounter (Signed)
Patient states that he would like to transfer to Malva Cogan, PA-C from Dr. Abner Greenspan. Is this okay?

## 2013-05-13 NOTE — Telephone Encounter (Signed)
I discussed patient's reason for transfer of care with nursing staff.  I am ok with taking on the patient if Dr. Abner Greenspan is ok with that.

## 2013-05-13 NOTE — Telephone Encounter (Signed)
Fine with me

## 2013-05-20 ENCOUNTER — Ambulatory Visit (INDEPENDENT_AMBULATORY_CARE_PROVIDER_SITE_OTHER): Payer: Medicare Other | Admitting: Physician Assistant

## 2013-05-20 ENCOUNTER — Encounter: Payer: Self-pay | Admitting: Physician Assistant

## 2013-05-20 VITALS — BP 146/88 | HR 70 | Temp 98.0°F | Resp 16 | Ht 66.5 in | Wt 183.5 lb

## 2013-05-20 DIAGNOSIS — E785 Hyperlipidemia, unspecified: Secondary | ICD-10-CM

## 2013-05-20 DIAGNOSIS — Z7689 Persons encountering health services in other specified circumstances: Secondary | ICD-10-CM

## 2013-05-20 DIAGNOSIS — R6889 Other general symptoms and signs: Secondary | ICD-10-CM

## 2013-05-20 DIAGNOSIS — Z23 Encounter for immunization: Secondary | ICD-10-CM

## 2013-05-20 DIAGNOSIS — Z7189 Other specified counseling: Secondary | ICD-10-CM

## 2013-05-20 DIAGNOSIS — I10 Essential (primary) hypertension: Secondary | ICD-10-CM

## 2013-05-20 DIAGNOSIS — R899 Unspecified abnormal finding in specimens from other organs, systems and tissues: Secondary | ICD-10-CM

## 2013-05-20 DIAGNOSIS — E079 Disorder of thyroid, unspecified: Secondary | ICD-10-CM

## 2013-05-20 DIAGNOSIS — Z0189 Encounter for other specified special examinations: Secondary | ICD-10-CM

## 2013-05-20 NOTE — Patient Instructions (Signed)
Please return for repeat labs.  I will call you with your results.  Please call me when you need your medication refills.  You will get a call from a pulmonology office for evaluation of your nighttime symptoms.  Please return to clini when you need [place].  It was a pleasure taking care of you today.

## 2013-05-20 NOTE — Progress Notes (Signed)
Patient ID: [name] [name], male   DOB: [birth year], 79 y.o.   MRN: [id number]  Patient presents to clinic today with his son to transfer care from Dr. Abner Greenspan.  Acute Concerns: Patient endorses difficulty sleeping at night.  States he will wake up in the middle of night, feeling like he stopped breathing.  Denies cough, chest pain, shortness of breath, leg swelling.  Patient with significant cardiovascular PMH, including HTN, CAD and aortic stenosis s/p AVR.  Recent Echo performed by cardiology due to same complaint at their visit showed moderate aortic valve leakage but no overt HF.  Cardiology did not feel it was related to HF.  Patient also denies history of sleep apnea.  Denies smoking.  Denies history of asthma or COPD.  Chronic Issues: (1) Hypertension -- Patient currently on losartan 50 mg.  Denies chest pain, palpitations, headache, LH, dizziness or syncope.  (2) Benign Prostatic Hyperplasia -- Patient currently on 5mg  Proscar QD.  Patient is followed by Urology.    (3) Hypothyroidism -- Patient currently on levothyroxine 50 mcg QD.  Last TSH obtained on 05/01/13 by Cardiology and is within normal limits.  (4) CAD -- Followed by Cardiology.  Last visit 05/01/2013.  (5) Hx of CVA -- 2009.  No recent TIAs, strokes or symptoms.  Is in stroke study with [facility].  (6) AAA -- Followed by vascular surgery.  Last ultrasound in 03/2013.  AAA at 3.4 cm and is decreasing in size.  Recommended follow-up ultrasound in 1 year.  (7) Hyperlipidemia -- Patient currently on Lipitor 80 mg and Ezetimibe 10 mg daily.  (8) Basal Cell Carcinoma -- Followed by Dermatology.   Health Maintenance: Dental -- up-to-date Vision -- up-to-date.  Sees Ophthalmologist -- Dr. Eulah Pont Colonoscopy -- up-to-date Immunizations -- has had flu shot this year.  Overdue for pneumonia shot.  Will check on pricing of Zostavax.    Past Medical History  Diagnosis Date  . CVA (cerebral infarction) 12/09  . CAD  (coronary artery disease)     stents in LXC and has a totally occluded RCA  . HTN (hypertension)   . Right iliac artery stenosis   . BPH (benign prostatic hyperplasia)   . Aortic stenosis     s/p AVR in 2003, bovine  . Diverticulosis   . AAA (abdominal aortic aneurysm)     s/p endovascular repair 2004  . Hyperlipemia   . Thyroid disease   . Glaucoma     recently told by Dr Eulah Pont he did not have it  . BCC (basal cell carcinoma) 09/17/2012  . Personal history of poliomyelitis 09/17/2012    In childhood  . Sleeping difficulties   . Leaky heart valve   . Chicken pox   . Polio   . Mumps   . Measles     Current Outpatient Prescriptions on File Prior to Visit  Medication Sig Dispense Refill  . atorvastatin (LIPITOR) 80 MG tablet Take 1 tablet (80 mg total) by mouth daily.  90 tablet  1  . B Complex-C (SUPER B COMPLEX) TABS Take 1 tablet by mouth daily.      . betamethasone dipropionate (DIPROLENE) 0.05 % cream       . ezetimibe (ZETIA) 10 MG tablet Take 1 tablet (10 mg total) by mouth daily.  90 tablet  1  . finasteride (PROSCAR) 5 MG tablet Take 1 tablet (5 mg total) by mouth daily.  90 tablet  1  . levothyroxine (SYNTHROID, LEVOTHROID) 50 MCG tablet Take 1  tablet (50 mcg total) by mouth daily.  90 tablet  3  . losartan (COZAAR) 50 MG tablet Take 1 tablet (50 mg total) by mouth daily.  90 tablet  1  . PLACEBO PO Take by mouth. 45 mg daily- research       No current facility-administered medications on file prior to visit.    Allergies  Allergen Reactions  . Amoxicillin-Pot Clavulanate   . Morphine And Related Nausea And Vomiting  . Penicillins Hives    Family History  Problem Relation Age of Onset  . Colon cancer Mother   . Aneurysm Father   . Heart attack Brother     CHF  . Prostate cancer Brother     History   Social History  . Marital Status: Married    Spouse Name: N/A    Number of Children: N/A  . Years of Education: N/A   Social History Main Topics  .  Smoking status: Former Smoker    Types: Cigarettes    Quit date: 10/04/1980  . Smokeless tobacco: None  . Alcohol Use: 8.4 oz/week    14 Glasses of wine per week     Comment: cocktails  . Drug Use: No  . Sexual Activity: Not Currently   Other Topics Concern  . None   Social History Narrative  . None   Review of Systems  Constitutional: Negative for fever, chills, weight loss and malaise/fatigue.  HENT: Negative for ear pain, hearing loss and tinnitus.   Eyes: Negative for blurred vision, double vision, photophobia and pain.  Respiratory: Negative for cough, shortness of breath and wheezing.   Cardiovascular: Negative for chest pain, palpitations and leg swelling.  Gastrointestinal: Negative for heartburn, nausea, vomiting, abdominal pain, diarrhea and constipation.  Genitourinary: Negative for dysuria, urgency, frequency, hematuria and flank pain.  Neurological: Negative for dizziness, seizures, loss of consciousness and headaches.  Psychiatric/Behavioral: Negative for depression. The patient is not nervous/anxious and does not have insomnia.    Filed Vitals:   05/20/13 1342  BP: 146/88  Pulse: 70  Temp: 98 F (36.7 C)  Resp: 16    Physical Exam  Vitals reviewed. Constitutional: He is oriented to person, place, and time.  Well-developed, overweight caucasian gentleman in no acute distress, sitting comfortably on examination table  HENT:  Head: Normocephalic and atraumatic.  Right Ear: External ear normal.  Left Ear: External ear normal.  Nose: Nose normal.  Mouth/Throat: Oropharynx is clear and moist. No oropharyngeal exudate.  L TM WNL bilaterally.  R TM with excessive cerumen, obstructing view of TM.  Eyes: Conjunctivae and EOM are normal. Pupils are equal, round, and reactive to light.  Neck: Normal range of motion. Neck supple.  Cardiovascular: Normal rate, regular rhythm and intact distal pulses.   Murmur heard. No peripheral edema noted on exam   Pulmonary/Chest: Effort normal and breath sounds normal. No respiratory distress. He has no wheezes. He has no rales. He exhibits no tenderness.  Abdominal: Soft. Bowel sounds are normal. He exhibits no distension and no mass. There is no tenderness. There is no rebound and no guarding.  Lymphadenopathy:    He has no cervical adenopathy.  Neurological: He is alert and oriented to person, place, and time. No cranial nerve deficit.  Skin: Skin is warm and dry. No rash noted.  Psychiatric: Affect normal.   Recent Results (from the past 2160 hour(s))  BASIC METABOLIC PANEL     Status: Abnormal   Collection Time    05/01/13  2:59 PM      Result Value Range   Sodium 147 (*) 135 - 145 mEq/L   Potassium 4.3  3.5 - 5.1 mEq/L   Chloride 108  96 - 112 mEq/L   CO2 30  19 - 32 mEq/L   Glucose, Bld 97  70 - 99 mg/dL   BUN 26 (*) 6 - 23 mg/dL   Creatinine, Ser 1.3  0.4 - 1.5 mg/dL   Calcium 9.3  8.4 - 96.0 mg/dL   GFR 45.40 (*) >98.11 mL/min  LIPID PANEL     Status: None   Collection Time    05/01/13  2:59 PM      Result Value Range   Cholesterol 130  0 - 200 mg/dL   Comment: ATP III Classification       Desirable:  < 200 mg/dL               Borderline High:  200 - 239 mg/dL          High:  > = 914 mg/dL   Triglycerides 78.2  0.0 - 149.0 mg/dL   Comment: Normal:  <956 mg/dLBorderline High:  150 - 199 mg/dL   HDL 21.30  >86.57 mg/dL   VLDL 84.6  0.0 - 96.2 mg/dL   LDL Cholesterol 70  0 - 99 mg/dL   Total CHOL/HDL Ratio 3     Comment:                Men          Women1/2 Average Risk     3.4          3.3Average Risk          5.0          4.42X Average Risk          9.6          7.13X Average Risk          15.0          11.0                      HEPATIC FUNCTION PANEL     Status: Abnormal   Collection Time    05/01/13  2:59 PM      Result Value Range   Total Bilirubin 2.2 (*) 0.3 - 1.2 mg/dL   Bilirubin, Direct 0.3  0.0 - 0.3 mg/dL   Alkaline Phosphatase 75  39 - 117 U/L   AST 23  0 - 37  U/L   ALT 18  0 - 53 U/L   Total Protein 6.3  6.0 - 8.3 g/dL   Albumin 3.7  3.5 - 5.2 g/dL  TSH     Status: None   Collection Time    05/01/13  2:59 PM      Result Value Range   TSH 2.69  0.35 - 5.50 uIU/mL    Assessment/Plan: Abnormal laboratory test result Will repeat BMP.  Need for prophylactic vaccination against Streptococcus pneumoniae (pneumococcus) Vaccination given by RN staff.  Need for assessment for sleep apnea Referral to Pulmonology for evaluation.  Encounter to establish care with new doctor Yearly labs already obtained and reviewed.  Updated problem list, medication, allergies, history.  Patient is checking on price of Zostavax immunization.  Up-to-date on other health maintenance parameters.  Patient followed by Cardiology, Vascular Surgery, Dermatology, Urology, and Neurology (stroke study).  HTN (hypertension) BP mildly elevated at today's visit.  Patient has  not taken his medication at time of interview.  Thyroid disease Recent TSH levels within normal limits.  Continue medication as prescribed.  Hyperlipemia Will continue current regimen.

## 2013-05-21 DIAGNOSIS — Z7689 Persons encountering health services in other specified circumstances: Secondary | ICD-10-CM | POA: Insufficient documentation

## 2013-05-21 DIAGNOSIS — Z23 Encounter for immunization: Secondary | ICD-10-CM | POA: Insufficient documentation

## 2013-05-21 DIAGNOSIS — Z0189 Encounter for other specified special examinations: Secondary | ICD-10-CM | POA: Insufficient documentation

## 2013-05-21 DIAGNOSIS — R899 Unspecified abnormal finding in specimens from other organs, systems and tissues: Secondary | ICD-10-CM | POA: Insufficient documentation

## 2013-05-21 NOTE — Assessment & Plan Note (Signed)
Vaccination given by Engineer, manufacturing.

## 2013-05-21 NOTE — Assessment & Plan Note (Signed)
Referral to Pulmonology for evaluation.

## 2013-05-21 NOTE — Assessment & Plan Note (Signed)
Yearly labs already obtained and reviewed.  Updated problem list, medication, allergies, history.  Patient is checking on price of Zostavax immunization.  Up-to-date on other health maintenance parameters.  Patient followed by Cardiology, Vascular Surgery, Dermatology, Urology, and Neurology (stroke study).

## 2013-05-21 NOTE — Assessment & Plan Note (Signed)
Will repeat BMP

## 2013-05-21 NOTE — Assessment & Plan Note (Signed)
Will continue current regimen

## 2013-05-21 NOTE — Assessment & Plan Note (Signed)
BP mildly elevated at today's visit.  Patient has not taken his medication at time of interview.

## 2013-05-21 NOTE — Assessment & Plan Note (Signed)
Recent TSH levels within normal limits.  Continue medication as prescribed.

## 2013-05-26 ENCOUNTER — Ambulatory Visit: Payer: Medicare Other | Admitting: Family Medicine

## 2013-06-29 ENCOUNTER — Encounter: Payer: Self-pay | Admitting: Pulmonary Disease

## 2013-06-29 ENCOUNTER — Ambulatory Visit (INDEPENDENT_AMBULATORY_CARE_PROVIDER_SITE_OTHER): Payer: Medicare Other | Admitting: Pulmonary Disease

## 2013-06-29 VITALS — BP 118/70 | HR 69 | Temp 97.6°F | Ht 65.0 in | Wt 189.0 lb

## 2013-06-29 DIAGNOSIS — Z87898 Personal history of other specified conditions: Secondary | ICD-10-CM

## 2013-06-29 DIAGNOSIS — Z9189 Other specified personal risk factors, not elsewhere classified: Secondary | ICD-10-CM

## 2013-06-29 NOTE — Assessment & Plan Note (Signed)
The patient has a history of snoring, but this has resolved using a positional pillow.  He currently does not snore according to his wife, has restorative sleep upon arising, and denies any inappropriate daytime sleepiness. His daughter agrees with this assessment. It is possible that he may have had some mild sleep apnea that is being treated by his special pillow.  I have asked him to work on weight loss, and to call me if he has any worsening symptoms that may suggest sleep disordered breathing.

## 2013-06-29 NOTE — Patient Instructions (Signed)
Continue to use your positional pillow Work on weight loss followup with me as needed.

## 2013-06-29 NOTE — Progress Notes (Signed)
Subjective:    Patient ID: [name] [name], male    DOB: 1923-01-23, 79 y.o.   MRN: [id number]  HPI The patient is a very pleasant 79 year old male who I've been asked to see for possible sleep apnea. The patient states that he used to have a snoring issue, as well as occasional gasping arousals. However, he did get a new positional pillow, and since that time has seen significant improvement in his sleep. His wife also states that he no longer snores. The patient does have some awakenings at night, but feels completely rested in the mornings upon arising. He denies any significant inappropriate daytime sleepiness, unless he does significant physical activities. His daughter has not seen him sleeping with television or reading. He denies any sleepiness in the evenings with television. He has no sleepiness with driving shorter distances, and currently does not drive longer distances. The patient states that his weight is neutral over the last few years, and his Epworth score today is only 5.   Sleep Questionnaire What time do you typically go to bed?( Between what hours) 9-930p 9-930p at 1432 on 06/29/13 by Maisie Fus, CMA How long does it take you to fall asleep?  at 1432 on 06/29/13 by Maisie Fus, CMA How many times during the night do you wake up? 3 3 at 1432 on 06/29/13 by Maisie Fus, CMA What time do you get out of bed to start your day? No Value 8-10p at 1432 on 06/29/13 by Maisie Fus, CMA Do you drive or operate heavy machinery in your occupation? No No at 1432 on 06/29/13 by Maisie Fus, CMA How much has your weight changed (up or down) over the past two years? (In pounds) 5 lb (2.268 kg)5 lb (2.268 kg) up/down at 1432 on 06/29/13 by Maisie Fus, CMA Have you ever had a sleep study before? No No at 1432 on 06/29/13 by Maisie Fus, CMA If yes, location of study? If yes, date of study? Do you currently use CPAP? No No at 1432 on 06/29/13 by  Maisie Fus, CMA Do you wear oxygen at any time? No No at 1432 on 06/29/13 by Maisie Fus, CMA   Review of Systems  Constitutional: Negative for fever and unexpected weight change.  HENT: Positive for congestion ( in AM), nosebleeds ( uses AYR gel), postnasal drip and rhinorrhea. Negative for dental problem, ear pain, sinus pressure, sneezing, sore throat and trouble swallowing.   Eyes: Negative for redness and itching.  Respiratory: Positive for cough and shortness of breath. Negative for chest tightness and wheezing.   Cardiovascular: Negative for palpitations and leg swelling.       Multiple heart/aortic stents  Gastrointestinal: Negative for nausea and vomiting.  Genitourinary: Negative for dysuria.  Musculoskeletal: Negative for joint swelling.  Skin: Negative for rash.  Neurological: Positive for headaches.  Hematological: Does not bruise/bleed easily.  Psychiatric/Behavioral: Negative for dysphoric mood. The patient is not nervous/anxious.        Objective:   Physical Exam Constitutional:  Overweight male, no acute distress  HENT:  Nares patent without discharge  Oropharynx without exudate, palate and uvula are mildly elongated.  Eyes:  Perrla, eomi, no scleral icterus  Neck:  No JVD, no TMG  Cardiovascular:  Normal rate, regular rhythm, no rubs or gallops.  Prominent 3/6 murmur        Intact distal pulses  Pulmonary :  Normal breath sounds, no stridor or respiratory  distress   No rales, rhonchi, or wheezing  Abdominal:  Soft, nondistended, bowel sounds present.  No tenderness noted.   Musculoskeletal:  mild lower extremity edema noted.  Lymph Nodes:  No cervical lymphadenopathy noted  Skin:  No cyanosis noted  Neurologic:  Alert, appropriate, moves all 4 extremities without obvious deficit.         Assessment & Plan:

## 2013-08-14 ENCOUNTER — Telehealth: Payer: Self-pay | Admitting: Nurse Practitioner

## 2013-08-14 NOTE — Telephone Encounter (Signed)
Spoke with pt. He reports he thinks shortness of breath has been getting worse recently. He reports it is hard to tell when this started but he has been waking up at night with shortness of breath. This has been occuring nightly. No snoring.  No swelling. Has appt in April with Truitt Merle, NP in early April.  Will review with Truitt Merle, NP

## 2013-08-14 NOTE — Telephone Encounter (Signed)
Will be glad to see back earlier.

## 2013-08-14 NOTE — Telephone Encounter (Signed)
Spoke with pt. He needs afternoon appt. Appt made for him to see Truitt Merle, NP on August 26, 2013 at 2:30. He is aware to call if symptoms worsen prior to this appt.

## 2013-08-14 NOTE — Telephone Encounter (Signed)
New Message  Pt called requesting a call back to talk.Marland Kitchen He states he doesn't know what he is experiencing other than being a little short of breath and its hard to sleep. He is requesting a call back from the nurse to discuss further.

## 2013-08-20 ENCOUNTER — Telehealth: Payer: Self-pay | Admitting: Nurse Practitioner

## 2013-08-20 NOTE — Telephone Encounter (Signed)
New Problem:  Pt is c/o his feet swelling. Pt would like to speak to a nurse.

## 2013-08-20 NOTE — Telephone Encounter (Signed)
Pt calls to let Cecille Rubin know he has had some swelling in his legs " up to my sock line". States swelling goes down at night & is not present when he awakens in the am.  Further states he is no more short of breath than usual & his weight is 185 ( due to the holiday eating)  Horton Chin RN

## 2013-08-21 ENCOUNTER — Ambulatory Visit (INDEPENDENT_AMBULATORY_CARE_PROVIDER_SITE_OTHER): Payer: Medicare Other | Admitting: Cardiovascular Disease

## 2013-08-21 ENCOUNTER — Telehealth: Payer: Self-pay | Admitting: Nurse Practitioner

## 2013-08-21 ENCOUNTER — Encounter: Payer: Self-pay | Admitting: Cardiovascular Disease

## 2013-08-21 VITALS — BP 170/60 | HR 80 | Resp 20 | Wt 187.0 lb

## 2013-08-21 DIAGNOSIS — I2510 Atherosclerotic heart disease of native coronary artery without angina pectoris: Secondary | ICD-10-CM

## 2013-08-21 DIAGNOSIS — E039 Hypothyroidism, unspecified: Secondary | ICD-10-CM

## 2013-08-21 DIAGNOSIS — R0602 Shortness of breath: Secondary | ICD-10-CM

## 2013-08-21 LAB — CBC
HCT: 40.5 % (ref 39.0–52.0)
Hemoglobin: 13.2 g/dL (ref 13.0–17.0)
MCHC: 32.7 g/dL (ref 30.0–36.0)
MCV: 98.3 fl (ref 78.0–100.0)
PLATELETS: 144 10*3/uL — AB (ref 150.0–400.0)
RBC: 4.12 Mil/uL — AB (ref 4.22–5.81)
RDW: 14 % (ref 11.5–14.6)
WBC: 8.1 10*3/uL (ref 4.5–10.5)

## 2013-08-21 LAB — BASIC METABOLIC PANEL
BUN: 29 mg/dL — AB (ref 6–23)
CO2: 25 meq/L (ref 19–32)
Calcium: 8.9 mg/dL (ref 8.4–10.5)
Chloride: 109 mEq/L (ref 96–112)
Creatinine, Ser: 1 mg/dL (ref 0.4–1.5)
GFR: 77.62 mL/min (ref >60.00)
GLUCOSE: 96 mg/dL (ref 70–99)
POTASSIUM: 4.1 meq/L (ref 3.5–5.1)
Sodium: 140 mEq/L (ref 135–145)

## 2013-08-21 LAB — HEPATIC FUNCTION PANEL
ALBUMIN: 3.6 g/dL (ref 3.5–5.2)
ALK PHOS: 86 U/L (ref 39–117)
ALT: 22 U/L (ref 0–53)
AST: 31 U/L (ref 0–37)
Bilirubin, Direct: 0.3 mg/dL (ref 0.0–0.3)
TOTAL PROTEIN: 6.5 g/dL (ref 6.0–8.3)
Total Bilirubin: 2 mg/dL — ABNORMAL HIGH (ref 0.3–1.2)

## 2013-08-21 MED ORDER — POTASSIUM CHLORIDE CRYS ER 20 MEQ PO TBCR: EXTENDED_RELEASE_TABLET | ORAL | Status: DC

## 2013-08-21 MED ORDER — FUROSEMIDE 80 MG PO TABS: ORAL_TABLET | ORAL | Status: DC

## 2013-08-21 NOTE — Patient Instructions (Signed)
Your physician has recommended you make the following change in your medication:  START Lasix 80 mg - take 1 today (Friday) Take Lasix 80 mg twice daily Saturday and Sunday then once daily starting Monday Take Potassium supplement every time you take Lasix  Your physician recommends that you keep your appointment with Truitt Merle, NP on Wed. 2/4 at 2:30.    Your physician has requested that you have a TEE to be set up by Truitt Merle. During a TEE, sound waves are used to create images of your heart. It provides your doctor with information about the size and shape of your heart and how well your heart's chambers and valves are working. In this test, a transducer is attached to the end of a flexible tube that's guided down your throat and into your esophagus (the tube leading from you mouth to your stomach) to get a more detailed image of your heart. You are not awake for the procedure. Please see the instruction sheet given to you today. For further information please visit [email or web address].  Follow up with Dr. Burt Knack will be scheduled after that appointment.

## 2013-08-21 NOTE — Telephone Encounter (Signed)
Will see him as planned unless he would like to try and be seen sooner.

## 2013-08-21 NOTE — Telephone Encounter (Signed)
New problem   Pt need to speak to [facility] asap per pt calling. Wouldn't tell me why.

## 2013-08-21 NOTE — Progress Notes (Signed)
HPI:   [name] [name] presents as a walk-in today. He is an 80 year old gentleman who was followed by Dr. Doreatha Lew for many years. He underwent bioprosthetic aortic valve replacement in 2003 for severe aortic stenosis. Other comorbid conditions include history of AAA repair, past stroke, hyperlipidemia, hypothyroidism, and coronary artery disease. He has undergone stenting the left circumflex and is known to have a totally occluded right coronary artery. He lives independently with his wife who has significant dementia. His son lives across the street.  The patient was scheduled to see Truitt Merle in followup next week. However, he walked in this morning because he felt like he couldn't wait until next week to be seen. He reports progressive shortness of breath over the last 2-3 months. He has developed orthopnea and PND. He was unable to sleep last night at all because of his breathing. He's had no chest pain or pressure. He admits to lightheadedness with postural changes. He has not had syncope. He denies heart palpitations. Over the last 4 or 5 days he has had swelling in his legs.  The patient had an echocardiogram in October. At that time he was found to have normal left ventricular systolic function with moderate aortic insufficiency that appeared both central and perivalvular. Observation was recommended as the patient had minimal symptoms and was not inclined to have any further evaluation.  Outpatient Encounter Prescriptions as of 08/21/2013  Medication Sig  . atorvastatin (LIPITOR) 80 MG tablet Take 1 tablet (80 mg total) by mouth daily.  . B Complex-C (SUPER B COMPLEX) TABS Take 1 tablet by mouth daily.  Marland Kitchen ezetimibe (ZETIA) 10 MG tablet Take 1 tablet (10 mg total) by mouth daily.  . finasteride (PROSCAR) 5 MG tablet Take 1 tablet (5 mg total) by mouth daily.  Marland Kitchen levothyroxine (SYNTHROID, LEVOTHROID) 50 MCG tablet Take 1 tablet (50 mcg total) by mouth daily.  Marland Kitchen losartan (COZAAR) 50 MG tablet  Take 1 tablet (50 mg total) by mouth daily.  Marland Kitchen PLACEBO PO Take by mouth. 45 mg daily- research  . furosemide (LASIX) 80 MG tablet Take 80 mg once daily in the morning.  On Saturday and Sunday take 2nd dose at 4:00 in the afternoon  . PARoxetine (PAXIL) 20 MG tablet Take 10 mg by mouth every morning.  . potassium chloride SA (K-DUR,KLOR-CON) 20 MEQ tablet Take 1 pill every time you take Lasix  . [DISCONTINUED] betamethasone dipropionate (DIPROLENE) 0.05 % cream     Allergies  Allergen Reactions  . Amoxicillin-Pot Clavulanate   . Morphine And Related Nausea And Vomiting  . Penicillins Hives    Past Medical History  Diagnosis Date  . CVA (cerebral infarction) 12/09  . CAD (coronary artery disease)     stents in [place] and has a totally occluded RCA  . HTN (hypertension)   . Right iliac artery stenosis   . BPH (benign prostatic hyperplasia)   . Aortic stenosis     s/p AVR in 2003, bovine  . Diverticulosis   . AAA (abdominal aortic aneurysm)     s/p endovascular repair 2004  . Hyperlipemia   . Thyroid disease   . Glaucoma     recently told by Dr Janyth Contes he did not have it  . BCC (basal cell carcinoma) 09/17/2012  . Personal history of poliomyelitis 09/17/2012    In childhood  . Sleeping difficulties   . Leaky heart valve   . Chicken pox   . Polio   . Mumps   .  Measles     ROS: Negative except as per HPI  BP 170/60  Pulse 80  Resp 20  Wt 187 lb (84.823 kg)  SpO2 91%  PHYSICAL EXAM: Pt is alert and oriented, pleasant elderly male in NAD HEENT: normal Neck: JVP - moderately elevated, carotids 2+= without bruits Lungs: Bibasilar rales bilaterally CV: RRR with grade 3/6 diastolic decrescendo murmur best heard at the left upper sternal border and grade 2/6 systolic ejection murmur at the right upper sternal border Abd: soft, NT, Positive BS, no hepatomegaly Ext: 1+ pretibial edema on the right and 2+ pretibial edema on the left, distal pulses intact and equal Skin:  warm/dry no rash  EKG:  Normal sinus rhythm 77 beats per minute, T-wave abnormality consider lateral ischemia.  2-D echocardiogram 05/05/2013: ------------------------------------------------------------ Left ventricle: Wall thickness was increased in a pattern of mild LVH. Systolic function was normal. The estimated ejection fraction was in the range of 55% to 60%.  ------------------------------------------------------------ Aortic valve: Tissue AVR Leaflets not well seen Moderate AR appears to be both central and perivalvular. Gradients are higher than should be as well Consider TEE if clinically indicated Doppler: VTI ratio of LVOT to aortic valve: 0.2. Valve area: 0.76cm^2(VTI). Indexed valve area: 0.39cm^2/m^2 (VTI). Peak velocity ratio of LVOT to aortic valve: 0.25. Valve area: 0.93cm^2 (Vmax). Indexed valve area: 0.48cm^2/m^2 (Vmax). Mean gradient: 57mm Hg (S). Peak gradient: 54mm Hg (S).  ------------------------------------------------------------ Mitral valve: Mildly thickened leaflets . Doppler: Trivial regurgitation. Peak gradient: 52mm Hg (D).  ------------------------------------------------------------ Left atrium: The atrium was mildly dilated.  ------------------------------------------------------------ Atrial septum: No defect or patent foramen ovale was identified.  ------------------------------------------------------------ Right ventricle: The cavity size was normal. Wall thickness was normal. Systolic function was normal.  ------------------------------------------------------------ Pulmonic valve: The valve appears to be grossly normal. Doppler: Trivial regurgitation.  ------------------------------------------------------------ Tricuspid valve: Structurally normal valve. Leaflet separation was normal. Doppler: Transvalvular velocity was within the normal range. No regurgitation.  ------------------------------------------------------------ Right  atrium: The atrium was normal in size.  ------------------------------------------------------------ Pericardium: The pericardium was normal in appearance.  ASSESSMENT AND PLAN: 1. Acute heart failure, probably secondary to valvular heart disease with perivalvular aortic insufficiency. Recommend start furosemide 80 mg twice daily. The patient will take his first dose when he gets home today. Recommend continue this for 3 days and then decrease dose to 80 mg once daily. He has a followup visit scheduled with Cecille Rubin next week and I would like him to keep this. He understands that if symptoms worsen he needs to call EMS or go directly to the emergency room for hospital admission. I'm hopeful that with diuresis he will improve over the next several days.  As long as he clinically improved with diuretic therapy, the next step will require [place] to assess the mechanism of his aortic insufficiency. I recommended a transesophageal echocardiogram. We'll ask Cecille Rubin to arrange this next week based on the patients clinical status. He understands that he may ultimately require either a reoperation or an attempt at percutaneous closure if a large perivalvular leak is confirmed.  2. Coronary artery disease. No anginal symptoms. Suspect heart failure symptoms are more related to his valve disease. Continue current therapy.  3. Hypertension. Blood pressure is elevated today in the setting of heart failure. We'll start with diuresis and reassess next week. He continues on losartan 50 mg daily.  Sherren Mocha 08/21/2013 1:55 PM

## 2013-08-24 LAB — BRAIN NATRIURETIC PEPTIDE: Pro B Natriuretic peptide (BNP): 556 pg/mL — ABNORMAL HIGH (ref 0.0–100.0)

## 2013-08-24 LAB — TSH: TSH: 5 u[IU]/mL (ref 0.35–5.50)

## 2013-08-26 ENCOUNTER — Encounter (INDEPENDENT_AMBULATORY_CARE_PROVIDER_SITE_OTHER): Payer: Self-pay

## 2013-08-26 ENCOUNTER — Encounter: Payer: Self-pay | Admitting: Nurse Practitioner

## 2013-08-26 ENCOUNTER — Other Ambulatory Visit: Payer: Self-pay | Admitting: *Deleted

## 2013-08-26 ENCOUNTER — Ambulatory Visit (INDEPENDENT_AMBULATORY_CARE_PROVIDER_SITE_OTHER): Payer: Medicare Other | Admitting: Nurse Practitioner

## 2013-08-26 VITALS — BP 122/60 | HR 68 | Ht 66.0 in | Wt 180.4 lb

## 2013-08-26 DIAGNOSIS — I38 Endocarditis, valve unspecified: Secondary | ICD-10-CM

## 2013-08-26 LAB — BASIC METABOLIC PANEL
BUN: 37 mg/dL — ABNORMAL HIGH (ref 6–23)
CO2: 29 mEq/L (ref 19–32)
Calcium: 9 mg/dL (ref 8.4–10.5)
Chloride: 102 mEq/L (ref 96–112)
Creatinine, Ser: 1.6 mg/dL — ABNORMAL HIGH (ref 0.4–1.5)
GFR: 43.88 mL/min — ABNORMAL LOW (ref >60.00)
Glucose, Bld: 89 mg/dL (ref 70–99)
Potassium: 4.4 mEq/L (ref 3.5–5.1)
Sodium: 140 mEq/L (ref 135–145)

## 2013-08-26 MED ORDER — FINASTERIDE 5 MG PO TABS: 5.0000 mg | ORAL_TABLET | Freq: Every day | ORAL | Status: DC

## 2013-08-26 MED ORDER — LOSARTAN POTASSIUM 50 MG PO TABS: 50.0000 mg | ORAL_TABLET | Freq: Every day | ORAL | Status: DC

## 2013-08-26 MED ORDER — PAROXETINE HCL 20 MG PO TABS: 10.0000 mg | ORAL_TABLET | ORAL | Status: DC

## 2013-08-26 MED ORDER — EZETIMIBE 10 MG PO TABS: 10.0000 mg | ORAL_TABLET | Freq: Every day | ORAL | Status: DC

## 2013-08-26 MED ORDER — LEVOTHYROXINE SODIUM 50 MCG PO TABS: 50.0000 ug | ORAL_TABLET | Freq: Every day | ORAL | Status: DC

## 2013-08-26 NOTE — Progress Notes (Signed)
[name] [name] Date of Birth: Dec 20, 1922 Medical Record [id number]  History of Present Illness: [name] [name] is seen back today for a follow up visit. Seen for Dr. Burt Knack. Former patient of Dr. Susa Simmonds. Now 80 years of age. Has known CAD with past stent to the LCX and has a totally occluded RCA. Had prior AVR by Dr. Cyndia Bent in 2003, past AAA repair in 2004, PVD, past CVA, HLD, BPH and hypothyroidism.   Seen back in October - taking care of his wife with advanced dementia. Updated his echo and noted perivalvular/central leak on his valve - tried to manage conservatively, but failed and was here last week in heart failure. Saw Dr. Burt Knack. Placed on lasix. Needs TEE to further define the degree of his aortic valve leak and may need to consider repeat surgery or attempt at percutaneous closure down at [facility].  Comes back today. Here with his son.  Doing much better. Breathing is much improved. Swelling almost resolved. No chest pain. Is having some cramps. Can sleep better. Unfortunately getting lots of salt due to him having to do the cooking. He is the primary caregiver for his wife.   Current Outpatient Prescriptions  Medication Sig Dispense Refill  . atorvastatin (LIPITOR) 80 MG tablet Take 1 tablet (80 mg total) by mouth daily.  90 tablet  1  . B Complex-C (SUPER B COMPLEX) TABS Take 1 tablet by mouth daily.      Marland Kitchen ezetimibe (ZETIA) 10 MG tablet Take 1 tablet (10 mg total) by mouth daily.  90 tablet  1  . finasteride (PROSCAR) 5 MG tablet Take 1 tablet (5 mg total) by mouth daily.  90 tablet  1  . furosemide (LASIX) 80 MG tablet Take 80 mg once daily in the morning.  On Saturday and Sunday take 2nd dose at 4:00 in the afternoon  90 tablet  3  . levothyroxine (SYNTHROID, LEVOTHROID) 50 MCG tablet Take 1 tablet (50 mcg total) by mouth daily.  90 tablet  3  . losartan (COZAAR) 50 MG tablet Take 1 tablet (50 mg total) by mouth daily.  90 tablet  1  . PARoxetine (PAXIL) 20 MG tablet Take 10 mg by  mouth every morning.      Marland Kitchen PLACEBO PO Take by mouth. 45 mg daily- research      . potassium chloride SA (K-DUR,KLOR-CON) 20 MEQ tablet Take 1 pill every time you take Lasix  30 tablet  6   No current facility-administered medications for this visit.    Allergies  Allergen Reactions  . Amoxicillin-Pot Clavulanate   . Morphine And Related Nausea And Vomiting  . Penicillins Hives    Past Medical History  Diagnosis Date  . CVA (cerebral infarction) 12/09  . CAD (coronary artery disease)     stents in [place] and has a totally occluded RCA  . HTN (hypertension)   . Right iliac artery stenosis   . BPH (benign prostatic hyperplasia)   . Aortic stenosis     s/p AVR in 2003, bovine  . Diverticulosis   . AAA (abdominal aortic aneurysm)     s/p endovascular repair 2004  . Hyperlipemia   . Thyroid disease   . Glaucoma     recently told by Dr Janyth Contes he did not have it  . BCC (basal cell carcinoma) 09/17/2012  . Personal history of poliomyelitis 09/17/2012    In childhood  . Sleeping difficulties   . Leaky heart valve   . Chicken pox   .  Polio   . Mumps   . Measles     Past Surgical History  Procedure Laterality Date  . Aortic valve replacement  2003    #51mm pericardial valve   . Abdominal aortic aneurysm repair  2004    s/p endovascular repiar of AAA in 2004  . Coronary stent placement    . Tonsillectomy    . Wisdom tooth extraction    . Appendectomy      History  Smoking status  . Former Smoker -- 0.50 packs/day for 30 years  . Types: Cigarettes  . Quit date: 10/04/1980  Smokeless tobacco  . Not on file    History  Alcohol Use  . 8.4 oz/week  . 14 Glasses of wine per week    Comment: cocktails/night    Family History  Problem Relation Age of Onset  . Colon cancer Mother   . Aneurysm Father   . Heart attack Brother     CHF  . Prostate cancer Brother     Review of Systems: The review of systems is per the HPI.  All other systems were reviewed and are  negative.  Physical Exam: Ht 5\' 6"  (1.676 m)  Wt 180 lb 6.4 oz (81.829 kg)  BMI 29.13 kg/m2 Patient is very pleasant and in no acute distress. Weigh is down. Skin is warm and dry. Color is normal.  HEENT is unremarkable. Normocephalic/atraumatic. PERRL. Sclera are nonicteric. Neck is supple. No masses. No JVD. Lungs are clear. Cardiac exam shows a regular rate and rhythm. Abdomen is soft. Extremities are without edema. Gait and ROM are intact. No gross neurologic deficits noted.  Wt Readings from Last 3 Encounters:  08/26/13 180 lb 6.4 oz (81.829 kg)  08/21/13 187 lb (84.823 kg)  06/29/13 189 lb (85.73 kg)     LABORATORY DATA: PENDING  Lab Results  Component Value Date   WBC 8.1 08/21/2013   HGB 13.2 08/21/2013   HCT 40.5 08/21/2013   PLT 144.0* 08/21/2013   GLUCOSE 96 08/21/2013   CHOL 130 05/01/2013   TRIG 99.0 05/01/2013   HDL 40.70 05/01/2013   LDLCALC 70 05/01/2013   ALT 22 08/21/2013   AST 31 08/21/2013   NA 140 08/21/2013   K 4.1 08/21/2013   CL 109 08/21/2013   CREATININE 1.0 08/21/2013   BUN 29* 08/21/2013   CO2 25 08/21/2013   TSH 5.00 08/21/2013   INR 1.1 07/03/2008   HGBA1C  Value: 6.2 (NOTE)   The ADA recommends the following therapeutic goal for glycemic   control related to Hgb A1C measurement:   Goal of Therapy:   < 7.0% Hgb A1C   Reference: American Diabetes Association: Clinical Practice   Recommendations 2008, Diabetes Care,  2008, 31:(Suppl 1).* 07/03/2008   Echo Study Conclusions from October 2014  - Left ventricle: Wall thickness was increased in a pattern of mild LVH. Systolic function was normal. The estimated ejection fraction was in the range of 55% to 60%. - Aortic valve: Tissue AVR Leaflets not well seen Moderate AR appears to be both central and perivalvular. Gradients are higher than should be as well Consider TEE if clinically indicated - Left atrium: The atrium was mildly dilated. - Atrial septum: No defect or patent foramen ovale  was identified.   Assessment / Plan: 1. Valvular heart failure - improved with diuresis. Needs BMET today. Try to restrict salt.   2. Prior AVR with perivalvular/central leak - needs TEE - will arrange for Dr. Aundra Dubin for next  week. Then to see Dr. Burt Knack in follow up for further disposition.   3. CAD - no active chest pain  4. Advanced age - but quite spry.   Patient is agreeable to this plan and will call if any problems develop in the interim.   Burtis Junes, RN, [facility] [address] [place], [place]  [id number] [phone]

## 2013-08-26 NOTE — Addendum Note (Signed)
Addended by: Burtis Junes on: 08/26/2013 03:30 PM   Modules accepted: Orders

## 2013-08-26 NOTE — Patient Instructions (Addendum)
We need to recheck your labs today  Continue with your current medicines.  Weigh yourself each morning and record.  Take extra dose of diuretic (Lasix) for weight gain of 2 to 3 pounds in 24 hours.   Try to limit your salt as best possible  We need to arrange for a TEE  You are scheduled for a TEE on Monday, February 9th at 12 noon with Dr. Aundra Dubin or associates. Please go to [facility] [place] [facility] at Monday, February 9th at 10 am.  Enter through the [place] not have any food or drink after midnight on Sunday.  You may take your medicines with a sip of water on the day of your procedure.  You will need someone to drive you home following your procedure.    Transesophageal Echocardiography Transesophageal echocardiography (TEE) is a special type of test that produces images of the heart by using sound waves (echocardiogram). This type of echocardiography can obtain better images of the heart than standard echocardiography. TEE is done by passing a flexible tube down the esophagus. The heart is located in front of the esophagus. Because the heart and esophagus are close to one another, your health care provider can take very clear, detailed pictures of the heart via ultrasound waves. TEE may be done:  If your health care provider needs more information based on standard echocardiography findings.  If you had a stroke. This might have happened because a clot formed in your heart. TEE can visualize different areas of the heart and check for clots.  To check valve anatomy and function.  To check for infection on the inside of your heart (endocarditis).  To evaluate the dividing wall (septum) of the heart and presence of a hole that did not close after birth (patent foramen ovale or atrial septal defect).  To help diagnose a tear in the wall of the aorta (aortic dissection).  During cardiac valve surgery. This allows the surgeon to assess the valve repair  before closing the chest.  During a variety of other cardiac procedures to guide positioning of catheters.  Sometimes before a cardioversion, which is a shock to convert heart rhythm back to normal. LET [facility] CARE PROVIDER KNOW ABOUT:   Any allergies you have.  All medicines you are taking, including vitamins, herbs, eye drops, creams, and over-the-counter medicines.  Previous problems you or members of your family have had with the use of anesthetics.  Any blood disorders you have.  Previous surgeries you have had.  Medical conditions you have.  Swallowing difficulties.  An esophageal obstruction. RISKS AND COMPLICATIONS  Generally, TEE is a safe procedure. However, as with any procedure, complications can occur. Possible complications include an esophageal tear (rupture). BEFORE THE PROCEDURE   Do not eat or drink for 6 hours before the procedure or as directed by your health care provider.  Arrange for someone to drive you home after the procedure. Do not drive yourself home. During the procedure, you will be given medicines that can continue to make you feel drowsy and can impair your reflexes.  An IV access tube will be started in the arm. PROCEDURE   A medicine to help you relax (sedative) will be given through the IV access tube.  A medicine that numbs the area (local anesthetic) may be sprayed in the back of the throat.  Your blood pressure, heart rate, and breathing (vital signs) will be monitored during the procedure.  The TEE  probe is a long, flexible tube. The tip of the probe is placed into the back of the mouth, and you will be asked to swallow. This helps to pass the tip of the probe into the esophagus. Once the tip of the probe is in the correct area, your health care provider can take pictures of the heart.  TEE is usually not a painful procedure. You may feel the probe press against the back of the throat. The probe does not enter the trachea and does  not affect your breathing.  Your time spent at the hospital is usually less than 2 hours. AFTER THE PROCEDURE   You will be in bed, resting, until you have fully returned to consciousness.  When you first awaken, your throat may feel slightly sore and will probably still feel numb. This will improve slowly over time.  You will not be allowed to eat or drink until it is clear that the numbness has improved.  Once you have been able to drink, urinate, and sit on the edge of the bed without feeling sick to your stomach (nausea) or dizzy, you may be cleared to go home.  You should have a friend or family member with you for the next 24 hours after your procedure. Document Released: 09/29/2002 Document Revised: 04/29/2013 Document Reviewed: 01/08/2013 [facility] Patient Information 2014 [place], [place].  Call the [place] office at [phone] if you have any questions, problems or concerns.

## 2013-08-27 NOTE — Progress Notes (Signed)
Quick Note:  Preliminary report reviewed by triage nurse and sent to MD desk. ______ 

## 2013-08-28 ENCOUNTER — Other Ambulatory Visit: Payer: Self-pay | Admitting: *Deleted

## 2013-08-28 DIAGNOSIS — R0602 Shortness of breath: Secondary | ICD-10-CM

## 2013-08-28 MED ORDER — FUROSEMIDE 40 MG PO TABS: ORAL_TABLET | ORAL | Status: DC

## 2013-08-28 MED ORDER — POTASSIUM CHLORIDE CRYS ER 10 MEQ PO TBCR: EXTENDED_RELEASE_TABLET | ORAL | Status: DC

## 2013-08-31 ENCOUNTER — Encounter (HOSPITAL_COMMUNITY): Admission: RE | Payer: Self-pay | Source: Ambulatory Visit

## 2013-08-31 ENCOUNTER — Other Ambulatory Visit: Payer: Self-pay | Admitting: Nurse Practitioner

## 2013-08-31 ENCOUNTER — Ambulatory Visit (HOSPITAL_COMMUNITY): Admission: RE | Admit: 2013-08-31 | Payer: Medicare Other | Source: Ambulatory Visit | Admitting: Cardiology

## 2013-08-31 ENCOUNTER — Telehealth: Payer: Self-pay | Admitting: Nurse Practitioner

## 2013-08-31 DIAGNOSIS — I359 Nonrheumatic aortic valve disorder, unspecified: Secondary | ICD-10-CM

## 2013-08-31 SURGERY — ECHOCARDIOGRAM, TRANSESOPHAGEAL
Anesthesia: Moderate Sedation

## 2013-08-31 NOTE — Telephone Encounter (Signed)
Patient has called here to the office this morning. Showed up in Pre admit - was for TEE today. Was told that this was cancelled. Very unclear as to why this was cancelled - he needs to be proceeding on with TEE.   Apparently someone called the [place] home on Friday - someone there must have cancelled this procedure.   We are rescheduling for Wednesday February 11th with Dr. Meda Coffee at 10:00AM needs to arrive at 8:30am  Will notify the patient.   Orders for NPO and IV ordered.

## 2013-08-31 NOTE — Telephone Encounter (Signed)
New message    Pt is at the hosp for a TEE and the hosp said it was cancelled Friday.  Before they leave, they want to make sure this is correct

## 2013-09-01 ENCOUNTER — Ambulatory Visit (INDEPENDENT_AMBULATORY_CARE_PROVIDER_SITE_OTHER): Payer: Self-pay | Admitting: *Deleted

## 2013-09-01 DIAGNOSIS — I6350 Cerebral infarction due to unspecified occlusion or stenosis of unspecified cerebral artery: Secondary | ICD-10-CM

## 2013-09-01 DIAGNOSIS — I639 Cerebral infarction, unspecified: Secondary | ICD-10-CM

## 2013-09-01 NOTE — Progress Notes (Signed)
Participant in the office today for his IRIS study EXIT visit. MMSE was completed successfully without any problems.   Blood was drawn and shipped.  Participant's last bottle of study medications was returned to the [place].  Participant said that he started treatment 2 weeks ago for CHF.  His Cardiologist is Dr. Burt Knack. Participant is to continue with follow-up visits with Dr. Burt Knack. Participant will be notified of study trial results in late 2015, at which time he will be advised as to continue with open label study drug.

## 2013-09-02 ENCOUNTER — Encounter (HOSPITAL_COMMUNITY): Payer: Self-pay | Admitting: *Deleted

## 2013-09-02 ENCOUNTER — Ambulatory Visit (HOSPITAL_COMMUNITY)
Admission: RE | Admit: 2013-09-02 | Discharge: 2013-09-02 | Disposition: A | Discharge status: Home / Self Care | Payer: Medicare Other | Source: Ambulatory Visit | Attending: Cardiology | Admitting: Cardiology

## 2013-09-02 ENCOUNTER — Encounter (HOSPITAL_COMMUNITY)
Admission: RE | Disposition: A | Discharge status: Home / Self Care | Payer: Self-pay | Source: Ambulatory Visit | Attending: Cardiology

## 2013-09-02 DIAGNOSIS — I059 Rheumatic mitral valve disease, unspecified: Secondary | ICD-10-CM | POA: Insufficient documentation

## 2013-09-02 DIAGNOSIS — I359 Nonrheumatic aortic valve disorder, unspecified: Secondary | ICD-10-CM | POA: Insufficient documentation

## 2013-09-02 DIAGNOSIS — Z954 Presence of other heart-valve replacement: Secondary | ICD-10-CM | POA: Insufficient documentation

## 2013-09-02 DIAGNOSIS — I700 Atherosclerosis of aorta: Secondary | ICD-10-CM | POA: Insufficient documentation

## 2013-09-02 HISTORY — PX: TEE WITHOUT CARDIOVERSION: SHX5443

## 2013-09-02 SURGERY — ECHOCARDIOGRAM, TRANSESOPHAGEAL
Anesthesia: Moderate Sedation

## 2013-09-02 MED ORDER — MIDAZOLAM HCL 5 MG/ML IJ SOLN
INTRAMUSCULAR | Status: AC
  Filled 2013-09-02: qty 2

## 2013-09-02 MED ORDER — FENTANYL CITRATE 0.05 MG/ML IJ SOLN
INTRAMUSCULAR | Status: DC | PRN
  Administered 2013-09-02 (×2): 25 ug via INTRAVENOUS

## 2013-09-02 MED ORDER — SODIUM CHLORIDE 0.9 % IV SOLN
INTRAVENOUS | Status: DC
  Administered 2013-09-02: 500 mL via INTRAVENOUS

## 2013-09-02 MED ORDER — MIDAZOLAM HCL 10 MG/2ML IJ SOLN
INTRAMUSCULAR | Status: DC | PRN
  Administered 2013-09-02 (×3): 1 mg via INTRAVENOUS

## 2013-09-02 MED ORDER — BUTAMBEN-TETRACAINE-BENZOCAINE 2-2-14 % EX AERO
INHALATION_SPRAY | CUTANEOUS | Status: DC | PRN
  Administered 2013-09-02: 2 via TOPICAL

## 2013-09-02 MED ORDER — FENTANYL CITRATE 0.05 MG/ML IJ SOLN
INTRAMUSCULAR | Status: AC
  Filled 2013-09-02: qty 2

## 2013-09-02 NOTE — H&P (View-Only) (Signed)
HPI:   [name] [name] presents as a walk-in today. He is an 80 year old gentleman who was followed by Dr. Doreatha [name] for many years. He underwent bioprosthetic aortic valve replacement in 2003 for severe aortic stenosis. Other comorbid conditions include history of AAA repair, past stroke, hyperlipidemia, hypothyroidism, and coronary artery disease. He has undergone stenting the left circumflex and is known to have a totally occluded right coronary artery. He lives independently with his wife who has significant dementia. His son lives across the street.  The patient was scheduled to see [name] [name] in followup next week. However, he walked in this morning because he felt like he couldn't wait until next week to be seen. He reports progressive shortness of breath over the last 2-3 months. He has developed orthopnea and PND. He was unable to sleep last night at all because of his breathing. He's had no chest pain or pressure. He admits to lightheadedness with postural changes. He has not had syncope. He denies heart palpitations. Over the last 4 or 5 days he has had swelling in his legs.  The patient had an echocardiogram in October. At that time he was found to have normal left ventricular systolic function with moderate aortic insufficiency that appeared both central and perivalvular. Observation was recommended as the patient had minimal symptoms and was not inclined to have any further evaluation.  Outpatient Encounter Prescriptions as of 08/21/2013  Medication Sig  . atorvastatin (LIPITOR) 80 MG tablet Take 1 tablet (80 mg total) by mouth daily.  . B Complex-C (SUPER B COMPLEX) TABS Take 1 tablet by mouth daily.  Marland Kitchen ezetimibe (ZETIA) 10 MG tablet Take 1 tablet (10 mg total) by mouth daily.  . finasteride (PROSCAR) 5 MG tablet Take 1 tablet (5 mg total) by mouth daily.  Marland Kitchen levothyroxine (SYNTHROID, LEVOTHROID) 50 MCG tablet Take 1 tablet (50 mcg total) by mouth daily.  Marland Kitchen losartan (COZAAR) 50 MG tablet  Take 1 tablet (50 mg total) by mouth daily.  Marland Kitchen PLACEBO PO Take by mouth. 45 mg daily- research  . furosemide (LASIX) 80 MG tablet Take 80 mg once daily in the morning.  On Saturday and Sunday take 2nd dose at 4:00 in the afternoon  . PARoxetine (PAXIL) 20 MG tablet Take 10 mg by mouth every morning.  . potassium chloride SA (K-DUR,KLOR-CON) 20 MEQ tablet Take 1 pill every time you take Lasix  . [DISCONTINUED] betamethasone dipropionate (DIPROLENE) 0.05 % cream     Allergies  Allergen Reactions  . Amoxicillin-Pot Clavulanate   . Morphine And Related Nausea And Vomiting  . Penicillins Hives    Past Medical History  Diagnosis Date  . CVA (cerebral infarction) 12/09  . CAD (coronary artery disease)     stents in [place] and has a totally occluded RCA  . HTN (hypertension)   . Right iliac artery stenosis   . BPH (benign prostatic hyperplasia)   . Aortic stenosis     s/p AVR in 2003, bovine  . Diverticulosis   . AAA (abdominal aortic aneurysm)     s/p endovascular repair 2004  . Hyperlipemia   . Thyroid disease   . Glaucoma     recently told by Dr [name] [name] he did not have it  . BCC (basal cell carcinoma) 09/17/2012  . Personal history of poliomyelitis 09/17/2012    In childhood  . Sleeping difficulties   . Leaky heart valve   . Chicken pox   . Polio   . Mumps   .  Measles     ROS: Negative except as per HPI  BP 170/60  Pulse 80  Resp 20  Wt 187 lb (84.823 kg)  SpO2 91%  PHYSICAL EXAM: Pt is alert and oriented, pleasant elderly male in NAD HEENT: normal Neck: JVP - moderately elevated, carotids 2+= without bruits Lungs: Bibasilar rales bilaterally CV: RRR with grade 3/6 diastolic decrescendo murmur best heard at the left upper sternal border and grade 2/6 systolic ejection murmur at the right upper sternal border Abd: soft, NT, Positive BS, no hepatomegaly Ext: 1+ pretibial edema on the right and 2+ pretibial edema on the left, distal pulses intact and equal Skin:  warm/dry no rash  EKG:  Normal sinus rhythm 77 beats per minute, T-wave abnormality consider lateral ischemia.  2-D echocardiogram 05/05/2013: ------------------------------------------------------------ Left ventricle: Wall thickness was increased in a pattern of mild LVH. Systolic function was normal. The estimated ejection fraction was in the range of 55% to 60%.  ------------------------------------------------------------ Aortic valve: Tissue AVR Leaflets not well seen Moderate AR appears to be both central and perivalvular. Gradients are higher than should be as well Consider TEE if clinically indicated Doppler: VTI ratio of LVOT to aortic valve: 0.2. Valve area: 0.76cm^2(VTI). Indexed valve area: 0.39cm^2/m^2 (VTI). Peak velocity ratio of LVOT to aortic valve: 0.25. Valve area: 0.93cm^2 (Vmax). Indexed valve area: 0.48cm^2/m^2 (Vmax). Mean gradient: 34mm Hg (S). Peak gradient: 61mm Hg (S).  ------------------------------------------------------------ Mitral valve: Mildly thickened leaflets . Doppler: Trivial regurgitation. Peak gradient: 3mm Hg (D).  ------------------------------------------------------------ Left atrium: The atrium was mildly dilated.  ------------------------------------------------------------ Atrial septum: No defect or patent foramen ovale was identified.  ------------------------------------------------------------ Right ventricle: The cavity size was normal. Wall thickness was normal. Systolic function was normal.  ------------------------------------------------------------ Pulmonic valve: The valve appears to be grossly normal. Doppler: Trivial regurgitation.  ------------------------------------------------------------ Tricuspid valve: Structurally normal valve. Leaflet separation was normal. Doppler: Transvalvular velocity was within the normal range. No regurgitation.  ------------------------------------------------------------ Right  atrium: The atrium was normal in size.  ------------------------------------------------------------ Pericardium: The pericardium was normal in appearance.  ASSESSMENT AND PLAN: 1. Acute heart failure, probably secondary to valvular heart disease with perivalvular aortic insufficiency. Recommend start furosemide 80 mg twice daily. The patient will take his first dose when he gets home today. Recommend continue this for 3 days and then decrease dose to 80 mg once daily. He has a followup visit scheduled with Lori next week and I would like him to keep this. He understands that if symptoms worsen he needs to call EMS or go directly to the emergency room for hospital admission. I'm hopeful that with diuresis he will improve over the next several days.  As long as he clinically improved with diuretic therapy, the next step will require us to assess the mechanism of his aortic insufficiency. I recommended a transesophageal echocardiogram. We'll ask Lori to arrange this next week based on the patients clinical status. He understands that he may ultimately require either a reoperation or an attempt at percutaneous closure if a large perivalvular leak is confirmed.  2. Coronary artery disease. No anginal symptoms. Suspect heart failure symptoms are more related to his valve disease. Continue current therapy.  3. Hypertension. Blood pressure is elevated today in the setting of heart failure. We'll start with diuresis and reassess next week. He continues on losartan 50 mg daily.  [name] [name] 08/21/2013 1:55 PM      

## 2013-09-02 NOTE — Interval H&P Note (Signed)
History and Physical Interval Note:  09/02/2013 9:45 AM  [name] [name]  has presented today for surgery, with the diagnosis of aortic valve disease  The various methods of treatment have been discussed with the patient and family. After consideration of risks, benefits and other options for treatment, the patient has consented to  Procedure(s): TRANSESOPHAGEAL ECHOCARDIOGRAM (TEE) (N/A) as a surgical intervention .  The patient's history has been reviewed, patient examined, no change in status, stable for surgery.  I have reviewed the patient's chart and labs.  Questions were answered to the patient's satisfaction.     Ena Dawley, H

## 2013-09-02 NOTE — Discharge Instructions (Signed)
Transesophageal Echocardiography Transesophageal echocardiography (TEE) is a special type of test that produces images of the heart by using sound waves (echocardiogram). This type of echocardiography can obtain better images of the heart than standard echocardiography. TEE is done by passing a flexible tube down the esophagus. The heart is located in front of the esophagus. Because the heart and esophagus are close to one another, your health care provider can take very clear, detailed pictures of the heart via ultrasound waves. TEE may be done:  If your health care provider needs more information based on standard echocardiography findings.  If you had a stroke. This might have happened because a clot formed in your heart. TEE can visualize different areas of the heart and check for clots.  To check valve anatomy and function.  To check for infection on the inside of your heart (endocarditis).  To evaluate the dividing wall (septum) of the heart and presence of a hole that did not close after birth (patent foramen ovale or atrial septal defect).  To help diagnose a tear in the wall of the aorta (aortic dissection).  During cardiac valve surgery. This allows the surgeon to assess the valve repair before closing the chest.  During a variety of other cardiac procedures to guide positioning of catheters.  Sometimes before a cardioversion, which is a shock to convert heart rhythm back to normal. LET [facility] CARE PROVIDER KNOW ABOUT:   Any allergies you have.  All medicines you are taking, including vitamins, herbs, eye drops, creams, and over-the-counter medicines.  Previous problems you or members of your family have had with the use of anesthetics.  Any blood disorders you have.  Previous surgeries you have had.  Medical conditions you have.  Swallowing difficulties.  An esophageal obstruction. RISKS AND COMPLICATIONS  Generally, TEE is a safe procedure. However, as with any  procedure, complications can occur. Possible complications include an esophageal tear (rupture). BEFORE THE PROCEDURE   Do not eat or drink for 6 hours before the procedure or as directed by your health care provider.  Arrange for someone to drive you home after the procedure. Do not drive yourself home. During the procedure, you will be given medicines that can continue to make you feel drowsy and can impair your reflexes.  An IV access tube will be started in the arm. PROCEDURE   A medicine to help you relax (sedative) will be given through the IV access tube.  A medicine that numbs the area (local anesthetic) may be sprayed in the back of the throat.  Your blood pressure, heart rate, and breathing (vital signs) will be monitored during the procedure.  The TEE probe is a long, flexible tube. The tip of the probe is placed into the back of the mouth, and you will be asked to swallow. This helps to pass the tip of the probe into the esophagus. Once the tip of the probe is in the correct area, your health care provider can take pictures of the heart.  TEE is usually not a painful procedure. You may feel the probe press against the back of the throat. The probe does not enter the trachea and does not affect your breathing.  Your time spent at the hospital is usually less than 2 hours. AFTER THE PROCEDURE   You will be in bed, resting, until you have fully returned to consciousness.  When you first awaken, your throat may feel slightly sore and will probably still feel numb. This will  improve slowly over time.  You will not be allowed to eat or drink until it is clear that the numbness has improved.  Once you have been able to drink, urinate, and sit on the edge of the bed without feeling sick to your stomach (nausea) or dizzy, you may be cleared to go home.  You should have a friend or family member with you for the next 24 hours after your procedure. Document Released: 09/29/2002  Document Revised: 04/29/2013 Document Reviewed: 01/08/2013 [facility] Patient Information 2014 [place], [place].

## 2013-09-02 NOTE — Progress Notes (Signed)
  Echocardiogram Echocardiogram Transesophageal has been performed.  [place], [place] 09/02/2013, 11:01 AM

## 2013-09-02 NOTE — CV Procedure (Signed)
     Transesophageal Echocardiogram Note  [name] [name] [id number] 1922/09/16  Procedure: Transesophageal Echocardiogram Indications: S/P bioprosthetic AVR, evaluate for AI  Procedure Details Consent: Obtained Time Out: Verified patient identification, verified procedure, site/side was marked, verified correct patient position, special equipment/implants available, Radiology Safety Procedures followed,  medications/allergies/relevent history reviewed, required imaging and test results available.  Performed  Medications: Fentanyl: 50 mcg  Versed: 4 mg  Left Ventrical:  Wall thickness was increased in a pattern of mild LVH. Systolic function was normal. The estimated ejection fraction was in the range of 55% to 60%.  Mitral Valve: Mildly thickened leaflets. Mild regurgitation.  Aortic Valve: A bioprosthetic valve sits well in the aortic position, there is no abnormal rocking motion. Leaflets open well and there is no stenosis. A moderate central aortic insufficiency with eccentric anteriorly directed jetispresent. There are two very small jets of paravalvular leak present at 5 o'clock (adjacent to RVOT in the proximity to the pulmonic valve) and at 10 o'clock (adjacent to the right atrium) when looking at the short axis of TEE.  Tricuspid Valve: Structurally normal valve. Trace regurgitation.  Pulmonic Valve: The valve appears to be grossly normal. Doppler: Trivial regurgitation.  Right ventricle: The cavity size was normal. Wall thickness was normal. Systolic function was normal.  Left Atrium/ Left atrial appendage: Left atrium: The atrium was mildly dilated. No thrombus in LA/LAA.   Atrial septum: No defect or patent foramen ovale was identified.  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 A focal severe partially mobile plague is seen at the ascending thoracic aorta measuring 5 x 3 mm.    Complications: No apparent complications Patient did tolerate procedure well.  Dorothy Spark, MD, [facility] 09/02/2013, 9:47 AM

## 2013-09-03 ENCOUNTER — Institutional Professional Consult (permissible substitution) (INDEPENDENT_AMBULATORY_CARE_PROVIDER_SITE_OTHER): Payer: Medicare Other | Admitting: Surgery

## 2013-09-03 ENCOUNTER — Encounter (HOSPITAL_COMMUNITY): Payer: Self-pay | Admitting: Cardiology

## 2013-09-03 ENCOUNTER — Ambulatory Visit: Payer: Medicare Other | Admitting: Cardiovascular Disease

## 2013-09-03 VITALS — BP 152/60 | HR 80 | Resp 20 | Ht 65.0 in | Wt 180.0 lb

## 2013-09-03 DIAGNOSIS — I350 Nonrheumatic aortic (valve) stenosis: Secondary | ICD-10-CM

## 2013-09-03 DIAGNOSIS — I359 Nonrheumatic aortic valve disorder, unspecified: Secondary | ICD-10-CM

## 2013-09-03 NOTE — Progress Notes (Signed)
Cardiothoracic Surgery Consultation   PCP is Hassell Done Sandria Manly, PA-C Referring Provider is Sherren Mocha, MD  Chief Complaint  Patient presents with  . Aortic Stenosis    Surgical eval for possible TAVR, TEE 09/02/2013    HPI:  The patient is an 80 year old gentleman with a history of hypertension, hyperlipidemia, diffuse vascular disease s/p EVAR 2004, prior stroke with recovery, and coronary disease. 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 He subsequently had a stent place in the L[facility] in 1997. He underwent AVR by me in July, 2003 for severe AS using an Edwards Perimount pericardial valve. His LCX stent was patent at that time and the RCA was occluded. He had an echo in October 2014 which showed moderate central AI through the prosthetic valve and some perivalvular leak. Since he felt well and had normal LV function continued followup was planned. He now reports a couple month history of progressive shortness of breath and at the end of January he developed orthopnea and PND. He presented to Dr. Burt Knack on 08/21/2013 after he was unable to sleep due to orthopnea. He noted swelling in his legs but no chest pain or pressure. He was treated with diuresis with quick resolution of his leg edema and shortness of breath. He now says he feels well. He underwent TEE yesterday showing moderate central AI through the prosthetic valve with 2 very small jets of paravalvular leak and an EF of 55-60%.  Past Medical History  Diagnosis Date  . CVA (cerebral infarction) 12/09  . CAD (coronary artery disease)     stents in [place] and has a totally occluded RCA  . HTN (hypertension)   . Right iliac artery stenosis   . BPH (benign prostatic hyperplasia)   . Aortic stenosis     s/p AVR in 2003, bovine  . Diverticulosis   . AAA (abdominal aortic aneurysm)     s/p endovascular repair 2004  . Hyperlipemia   . Thyroid disease   . Glaucoma     recently told by Dr Janyth Contes he did  not have it  . BCC (basal cell carcinoma) 09/17/2012  . Personal history of poliomyelitis 09/17/2012    In childhood  . Sleeping difficulties   . Leaky heart valve   . Chicken pox   . Polio   . Mumps   . Measles     Past Surgical History  Procedure Laterality Date  . Aortic valve replacement  2003    #10mm pericardial valve   . Abdominal aortic aneurysm repair  2004    s/p endovascular repiar of AAA in 2004  . Coronary stent placement    . Tonsillectomy    . Wisdom tooth extraction    . Appendectomy    . Tee without cardioversion N/A 09/02/2013    Procedure: TRANSESOPHAGEAL ECHOCARDIOGRAM (TEE);  Surgeon: Dorothy Spark, MD;  Location: [facility] ENDOSCOPY;  Service: Cardiovascular;  Laterality: N/A;    Family History  Problem Relation Age of Onset  . Colon cancer Mother   . Aneurysm Father   . Heart attack Brother     CHF  . Prostate cancer Brother     Social History History  Substance Use Topics  . Smoking status: Former Smoker -- 0.50 packs/day for 30 years    Types: Cigarettes    Quit date: 10/04/1980  . Smokeless tobacco: Not on file  . Alcohol Use: 8.4 oz/week    14 Glasses of wine per week  Comment: cocktails/night    Current Outpatient Prescriptions  Medication Sig Dispense Refill  . aspirin 81 MG tablet Take 81 mg by mouth daily.      Marland Kitchen atorvastatin (LIPITOR) 80 MG tablet Take 1 tablet (80 mg total) by mouth daily.  90 tablet  1  . B Complex-C (SUPER B COMPLEX) TABS Take 1 tablet by mouth daily.      Marland Kitchen ezetimibe (ZETIA) 10 MG tablet Take 1 tablet (10 mg total) by mouth daily.  90 tablet  3  . finasteride (PROSCAR) 5 MG tablet Take 1 tablet (5 mg total) by mouth daily.  90 tablet  3  . furosemide (LASIX) 40 MG tablet Take ( 40 mg) daily and and extra ( 40 mg) for weight gain up to 2-3 pounds over night  90 tablet  3  . levothyroxine (SYNTHROID, LEVOTHROID) 50 MCG tablet Take 1 tablet (50 mcg total) by mouth daily.  90 tablet  3  . losartan (COZAAR) 50 MG  tablet Take 1 tablet (50 mg total) by mouth daily.  90 tablet  3  . Multiple Vitamin (MULTI VITAMIN DAILY PO) Take by mouth.      Marland Kitchen PARoxetine (PAXIL) 20 MG tablet Take 0.5 tablets (10 mg total) by mouth every morning.  45 tablet  3  . potassium chloride SA (K-DUR,KLOR-CON) 10 MEQ tablet Take 1 pill every time you take Lasix  30 tablet  6   No current facility-administered medications for this visit.    Allergies  Allergen Reactions  . Amoxicillin-Pot Clavulanate   . Morphine And Related Nausea And Vomiting  . Penicillins Hives    Review of Systems  Constitutional: Positive for fatigue.       Weight loss  HENT: Positive for hearing loss.   Eyes:       Floaters  Respiratory: Positive for shortness of breath.   Cardiovascular: Positive for leg swelling. Negative for chest pain.       Orthopnea and PND but none since starting lasix.  Gastrointestinal: Positive for diarrhea.       Heartburn  Endocrine: Negative.   Genitourinary: Positive for frequency.       BPH  Musculoskeletal: Positive for arthralgias and myalgias.  Allergic/Immunologic: Negative.   Neurological: Positive for dizziness and headaches.       Prior stroke  Hematological: Bruises/bleeds easily.       Frequent nosebleeds  Psychiatric/Behavioral: The patient is nervous/anxious.        Stress from providing most of the care for his wife who has dementia.    BP 152/60  Pulse 80  Resp 20  Ht 5\' 5"  (1.651 m)  Wt 180 lb (81.647 kg)  BMI 29.95 kg/m2  SpO2 96% Physical Exam  Constitutional: He is oriented to person, place, and time. He appears well-developed and well-nourished. No distress.  HENT:  Head: Normocephalic and atraumatic.  Mouth/Throat: Oropharynx is clear and moist.  Eyes: EOM are normal. Pupils are equal, round, and reactive to light.  Neck: Normal range of motion. Neck supple. No JVD present.  Cardiovascular: Normal rate and regular rhythm.   3/6 diastolic murmur at apex  Pulmonary/Chest:  Effort normal and breath sounds normal. No respiratory distress. He has no rales.  Old sternotomy scar. Sternum stable  Abdominal: Soft. Bowel sounds are normal. He exhibits no distension and no mass. There is no tenderness.  Musculoskeletal: He exhibits no edema.  Neurological: He is alert and oriented to person, place, and time. He has normal strength.  No cranial nerve deficit or sensory deficit.  Skin: Skin is warm and dry.  Psychiatric: He has a normal mood and affect. His behavior is normal. Judgment and thought content normal.     Diagnostic Tests:  Transesophageal Echocardiogram Note  DICK HARK  [id number]  17-Apr-1923  Procedure: Transesophageal Echocardiogram  Indications: S/P bioprosthetic AVR, evaluate for AI  Procedure Details  Consent: Obtained  Time Out: Verified patient identification, verified procedure, site/side was marked, verified correct patient position, special equipment/implants available, Radiology Safety Procedures followed, medications/allergies/relevent history reviewed, required imaging and test results available. Performed  Medications:  Fentanyl: 50 mcg  Versed: 4 mg  Left Ventrical: Wall thickness was increased in a pattern of mild LVH. Systolic function was normal. The estimated ejection fraction was in the range of 55% to 60%.  Mitral Valve: Mildly thickened leaflets. Mild regurgitation.  Aortic Valve: A bioprosthetic valve sits well in the aortic position, there is no abnormal rocking motion. Leaflets open well and there is no stenosis. A moderate central aortic insufficiency with eccentric anteriorly directed jetispresent. There are two very small jets of paravalvular leak present at 5 o'clock (adjacent to RVOT in the proximity to the pulmonic valve) and at 10 o'clock (adjacent to the right atrium) when looking at the short axis of TEE.  Tricuspid Valve: Structurally normal valve. Trace regurgitation.  Pulmonic Valve: The valve appears to  be grossly normal. Doppler: Trivial regurgitation.  Right ventricle: The cavity size was normal. Wall thickness was normal. Systolic function was normal.  Left Atrium/ Left atrial appendage: Left atrium: The atrium was mildly dilated. No thrombus in LA/LAA.  Atrial septum: No defect or patent foramen ovale was identified.  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 A focal severe partially mobile plague is seen at the ascending thoracic aorta measuring 5 x 3 mm.  Complications: No apparent complications  Patient did tolerate procedure well.  Ena Dawley, Lemmie Evens, MD, [facility]  09/02/2013, 9:47 AM    Impression:  He is an 80 year old gentleman in fairly good shape who has diffuse vascular disease as noted above, coronary disease with prior MI and stenting, prior pericardial AVR in 2003 now with moderate regurgitation through the valve with no stenosis and normal LV function. He was doing well until he presented with a several month history of progressive congestive heart failure symptoms. He has done well since being started on Lasix. His son says he eats a lot of pre-packaged TV dinners which are loaded with sodium. I think he would be a very high risk patient for a redo AVR and possible CABG at age 8 with his diffuse vascular disease. Valve in valve TAVR may be a consideration but he is doing well at this time and should be watched closely for recurrent CHF symptoms and worsening of LV function. If either of these occur then TAVR would probably be the best option but I think he would have be clearly failing medical therapy with worsening clinical status to justify doing it. I discussed this with the patient and his son and answered all of their questions. They understand and agree with close follow-up.  Plan:  He will return to follow-up with Dr. Burt Knack and Truitt Merle, NP.

## 2013-09-21 ENCOUNTER — Ambulatory Visit (INDEPENDENT_AMBULATORY_CARE_PROVIDER_SITE_OTHER): Payer: Medicare Other | Admitting: Nurse Practitioner

## 2013-09-21 VITALS — BP 130/68 | HR 76 | Ht 66.0 in | Wt 182.8 lb

## 2013-09-21 DIAGNOSIS — I2510 Atherosclerotic heart disease of native coronary artery without angina pectoris: Secondary | ICD-10-CM

## 2013-09-21 DIAGNOSIS — I38 Endocarditis, valve unspecified: Secondary | ICD-10-CM

## 2013-09-21 DIAGNOSIS — I359 Nonrheumatic aortic valve disorder, unspecified: Secondary | ICD-10-CM

## 2013-09-21 DIAGNOSIS — I259 Chronic ischemic heart disease, unspecified: Secondary | ICD-10-CM

## 2013-09-21 MED ORDER — ATORVASTATIN CALCIUM 80 MG PO TABS: 80.0000 mg | ORAL_TABLET | Freq: Every day | ORAL | Status: DC

## 2013-09-21 NOTE — Progress Notes (Signed)
[name] [name] Date of Birth: 14-May-1923 Medical Record #[id number]  History of Present Illness: [name] [name] is seen back today for a follow up visit. Seen for Dr. Burt Knack. Former patient of Dr. Susa Simmonds. Now 80 years of age. Has known CAD with past stent to the LCX and has a totally occluded RCA. Had prior AVR by Dr. Cyndia Bent in 2003, past AAA repair in 2004, PVD, past CVA, HLD, BPH and hypothyroidism.   Seen back in October - taking care of his wife with advanced dementia. Updated his echo and noted perivalvular/central leak on his valve - tried to manage conservatively, but failed and was here back in January with heart failure. Saw Dr. Burt Knack. Placed on lasix. Tuned up VERY nicely with medicines. His diet is quite high in salt (heating up Geradine Girt dinners due to caring for demented wife, etc). TEE defined more of a central leak. Referred back to Dr. Cyndia Bent for possible redo AVR.   Comes back today. Here with [name], his son. Doing ok. Dr. Cyndia Bent has turned him down for redo AVR/possible CABG. Would favor continued medical management and consider TAVR (valve in valve) if he fails medical therapy. [name] [name] and his son were not clear as to what the plan of care was to be. He notes that his back is hurting. He is not short of breath. No swelling. Weight is stable. No chest pain. He remains the primary care giver for his wife. Trying to do better about restricting salt.   Current Outpatient Prescriptions  Medication Sig Dispense Refill  . aspirin 81 MG tablet Take 81 mg by mouth daily.      Marland Kitchen atorvastatin (LIPITOR) 80 MG tablet Take 1 tablet (80 mg total) by mouth daily.  90 tablet  1  . B Complex-C (SUPER B COMPLEX) TABS Take 1 tablet by mouth daily.      Marland Kitchen ezetimibe (ZETIA) 10 MG tablet Take 1 tablet (10 mg total) by mouth daily.  90 tablet  3  . finasteride (PROSCAR) 5 MG tablet Take 1 tablet (5 mg total) by mouth daily.  90 tablet  3  . furosemide (LASIX) 40 MG tablet Take ( 40 mg) daily and and extra  ( 40 mg) for weight gain up to 2-3 pounds over night  90 tablet  3  . levothyroxine (SYNTHROID, LEVOTHROID) 50 MCG tablet Take 1 tablet (50 mcg total) by mouth daily.  90 tablet  3  . losartan (COZAAR) 50 MG tablet Take 1 tablet (50 mg total) by mouth daily.  90 tablet  3  . Multiple Vitamin (MULTI VITAMIN DAILY PO) Take by mouth.      Marland Kitchen PARoxetine (PAXIL) 20 MG tablet Take 0.5 tablets (10 mg total) by mouth every morning.  45 tablet  3  . potassium chloride SA (K-DUR,KLOR-CON) 10 MEQ tablet Take 1 pill every time you take Lasix  30 tablet  6   No current facility-administered medications for this visit.    Allergies  Allergen Reactions  . Amoxicillin-Pot Clavulanate   . Morphine And Related Nausea And Vomiting  . Penicillins Hives    Past Medical History  Diagnosis Date  . CVA (cerebral infarction) 12/09  . CAD (coronary artery disease)     stents in [place] and has a totally occluded RCA  . HTN (hypertension)   . Right iliac artery stenosis   . BPH (benign prostatic hyperplasia)   . Aortic stenosis     s/p AVR in 2003, bovine  . Diverticulosis   .  AAA (abdominal aortic aneurysm)     s/p endovascular repair 2004  . Hyperlipemia   . Thyroid disease   . Glaucoma     recently told by Dr Janyth Contes he did not have it  . BCC (basal cell carcinoma) 09/17/2012  . Personal history of poliomyelitis 09/17/2012    In childhood  . Sleeping difficulties   . Leaky heart valve   . Chicken pox   . Polio   . Mumps   . Measles     Past Surgical History  Procedure Laterality Date  . Aortic valve replacement  2003    #60mm pericardial valve   . Abdominal aortic aneurysm repair  2004    s/p endovascular repiar of AAA in 2004  . Coronary stent placement    . Tonsillectomy    . Wisdom tooth extraction    . Appendectomy    . Tee without cardioversion N/A 09/02/2013    Procedure: TRANSESOPHAGEAL ECHOCARDIOGRAM (TEE);  Surgeon: Dorothy Spark, MD;  Location: [facility] ENDOSCOPY;  Service:  Cardiovascular;  Laterality: N/A;    History  Smoking status  . Former Smoker -- 0.50 packs/day for 30 years  . Types: Cigarettes  . Quit date: 10/04/1980  Smokeless tobacco  . Not on file    History  Alcohol Use  . 8.4 oz/week  . 14 Glasses of wine per week    Comment: cocktails/night    Family History  Problem Relation Age of Onset  . Colon cancer Mother   . Aneurysm Father   . Heart attack Brother     CHF  . Prostate cancer Brother     Review of Systems: The review of systems is per the HPI.  All other systems were reviewed and are negative.  Physical Exam: Ht 5\' 6"  (1.676 m)  Wt 182 lb 12.8 oz (82.918 kg)  BMI 29.52 kg/m2 Patient is very pleasant and in no acute distress. He is a little hard of hearing. Looks younger than his stated age. kin is warm and dry. Color is normal.  HEENT is unremarkable. Normocephalic/atraumatic. PERRL. Sclera are nonicteric. Neck is supple. No masses. No JVD. Lungs are clear. Cardiac exam shows a regular rate and rhythm. Harsh outflow murmur. Abdomen is soft. Extremities are without any edema. Gait and ROM are intact. No gross neurologic deficits noted.  Wt Readings from Last 3 Encounters:  09/21/13 182 lb 12.8 oz (82.918 kg)  09/03/13 180 lb (81.647 kg)  09/02/13 180 lb (81.647 kg)     LABORATORY DATA: PENDING  Lab Results  Component Value Date   WBC 8.1 08/21/2013   HGB 13.2 08/21/2013   HCT 40.5 08/21/2013   PLT 144.0* 08/21/2013   GLUCOSE 89 08/26/2013   CHOL 130 05/01/2013   TRIG 99.0 05/01/2013   HDL 40.70 05/01/2013   LDLCALC 70 05/01/2013   ALT 22 08/21/2013   AST 31 08/21/2013   NA 140 08/26/2013   K 4.4 08/26/2013   CL 102 08/26/2013   CREATININE 1.6* 08/26/2013   BUN 37* 08/26/2013   CO2 29 08/26/2013   TSH 5.00 08/21/2013   INR 1.1 07/03/2008   HGBA1C  Value: 6.2 (NOTE)   The ADA recommends the following therapeutic goal for glycemic   control related to Hgb A1C measurement:   Goal of Therapy:   < 7.0% Hgb A1C   Reference:  American Diabetes Association: Clinical Practice   Recommendations 2008, Diabetes Care,  2008, 31:(Suppl 1).* 07/03/2008   TEE Study Conclusions  - Left  ventricle: Mild concentric left ventricular hypertrophy. Systolic function was normal. The estimated ejection fraction was in the range of 50% to 55%. - Aortic valve: A bioprosthetic valve sits well in the aortic position, there is no abnormal rocking motion. Leaflets open well and there is no stenosis. A moderate central aortic insufficiency with eccentric anteriorly directed jetispresent. There are two very small jets of paravalvular leak present at 5 o'clock (adjacent to RVOT in the proximity to the pulmonic valve) and at 10 o'clock (adjacent to the right atrium) when looking at the short axis of TEE. 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 There is severe focal partially mobile plague in the ascending thoracic aorta mesuring 5 x 3 mm. - Mitral valve: Mildly thickened leaflets . Mild regurgitation. - Left atrium: The atrium was mildly dilated. No evidence of thrombus in the atrial cavity or appendage. No evidence of thrombus in the appendage. No evidence of thrombus in the atrial cavity or appendage. - Right atrium: No evidence of thrombus in the atrial cavity or appendage. No evidence of thrombus in the atrial cavity or appendage. - Atrial septum: No defect or patent foramen ovale was identified. Echo contrast study showed no right-to-left atrial level shunt, following an increase in RA pressure induced by provocative maneuvers. - Tricuspid valve: A prosthesis was present and functioning normally. The prosthesis had a normal range of motion. The sewing ring appeared normal, had no rocking motion, and showed no evidence of dehiscence. - Pulmonic valve: No evidence of vegetation.    Assessment / Plan: 1. Valvular heart failure - doing ok clinically.  We have opted for continued medical management. Salt use is going to be an ongoing issue due to the social situation that he finds himself in. Will continue with current regimen. Will need close follow up. Do not feel that further testing at this time is needed. Patient is subsequently seen with Dr. Burt Knack as well today. Will need follow up echo in August.   2. CAD - no chest pain  3. Advanced age  80. CKD - recheck lab today.   5. HLD - refilled his statin today.  Patient is agreeable to this plan and will call if any problems develop in the interim.   Burtis Junes, RN, [facility] [address] [place],   [address] [phone]

## 2013-09-21 NOTE — Patient Instructions (Addendum)
We need to check labs today  Stay on your current medicines   Try to limit your sodium as best you can  Weigh every morning - take an extra dose of your fluid pill if your weight goes up 2 to 3 pounds overnight.   I will see you in a month  Call the [place] office at [phone] if you have any questions, problems or concerns.

## 2013-09-22 ENCOUNTER — Other Ambulatory Visit: Payer: Self-pay | Admitting: *Deleted

## 2013-09-22 LAB — BASIC METABOLIC PANEL
BUN: 39 mg/dL — ABNORMAL HIGH (ref 6–23)
CO2: 29 mEq/L (ref 19–32)
Calcium: 8.9 mg/dL (ref 8.4–10.5)
Chloride: 104 mEq/L (ref 96–112)
Creatinine, Ser: 1.9 mg/dL — ABNORMAL HIGH (ref 0.4–1.5)
GFR: 36.38 mL/min — ABNORMAL LOW (ref >60.00)
Glucose, Bld: 87 mg/dL (ref 70–99)
Potassium: 4.3 mEq/L (ref 3.5–5.1)
Sodium: 139 mEq/L (ref 135–145)

## 2013-09-22 MED ORDER — FUROSEMIDE 40 MG PO TABS: 20.0000 mg | ORAL_TABLET | Freq: Every day | ORAL | Status: DC

## 2013-10-21 ENCOUNTER — Other Ambulatory Visit: Payer: Self-pay | Admitting: *Deleted

## 2013-10-21 ENCOUNTER — Ambulatory Visit (INDEPENDENT_AMBULATORY_CARE_PROVIDER_SITE_OTHER): Payer: Medicare Other | Admitting: Nurse Practitioner

## 2013-10-21 ENCOUNTER — Encounter: Payer: Self-pay | Admitting: Nurse Practitioner

## 2013-10-21 VITALS — BP 140/50 | HR 69 | Ht 66.5 in | Wt 184.0 lb

## 2013-10-21 DIAGNOSIS — R0609 Other forms of dyspnea: Secondary | ICD-10-CM

## 2013-10-21 DIAGNOSIS — I38 Endocarditis, valve unspecified: Secondary | ICD-10-CM

## 2013-10-21 DIAGNOSIS — I2510 Atherosclerotic heart disease of native coronary artery without angina pectoris: Secondary | ICD-10-CM

## 2013-10-21 DIAGNOSIS — N189 Chronic kidney disease, unspecified: Secondary | ICD-10-CM

## 2013-10-21 DIAGNOSIS — R0989 Other specified symptoms and signs involving the circulatory and respiratory systems: Secondary | ICD-10-CM

## 2013-10-21 DIAGNOSIS — I359 Nonrheumatic aortic valve disorder, unspecified: Secondary | ICD-10-CM

## 2013-10-21 DIAGNOSIS — R0600 Dyspnea, unspecified: Secondary | ICD-10-CM

## 2013-10-21 LAB — BASIC METABOLIC PANEL
BUN: 26 mg/dL — ABNORMAL HIGH (ref 6–23)
CO2: 27 mEq/L (ref 19–32)
Calcium: 9.5 mg/dL (ref 8.4–10.5)
Chloride: 108 mEq/L (ref 96–112)
Creatinine, Ser: 1.3 mg/dL (ref 0.4–1.5)
GFR: 57.37 mL/min — ABNORMAL LOW (ref >60.00)
Glucose, Bld: 73 mg/dL (ref 70–99)
Potassium: 4.5 mEq/L (ref 3.5–5.1)
Sodium: 141 mEq/L (ref 135–145)

## 2013-10-21 LAB — BRAIN NATRIURETIC PEPTIDE: Pro B Natriuretic peptide (BNP): 353 pg/mL — ABNORMAL HIGH (ref 0.0–100.0)

## 2013-10-21 MED ORDER — EZETIMIBE 10 MG PO TABS: 10.0000 mg | ORAL_TABLET | Freq: Every day | ORAL | Status: DC

## 2013-10-21 MED ORDER — FINASTERIDE 5 MG PO TABS: 5.0000 mg | ORAL_TABLET | Freq: Every day | ORAL | Status: DC

## 2013-10-21 MED ORDER — LOSARTAN POTASSIUM 50 MG PO TABS: 50.0000 mg | ORAL_TABLET | Freq: Every day | ORAL | Status: DC

## 2013-10-21 NOTE — Patient Instructions (Signed)
Stay on your same medicines  I will see you in a month  We will check labs today  Call the [place] office at [phone] if you have any questions, problems or concerns.

## 2013-10-21 NOTE — Progress Notes (Signed)
[name] [name] Date of Birth: [birth year] Medical Record #[id number]  History of Present Illness: [name] [name] is seen back today for a follow up visit. Seen for Dr. Burt [name]. Former patient of Dr. Susa [name]. Now 80 years of age. Has known CAD with past stent to the LCX and has a totally occluded RCA. Had prior AVR by Dr. Cyndia [name] in 2003, past AAA repair in 2004, PVD, past CVA, HLD, BPH and hypothyroidism.   Seen back in October - taking care of his wife with advanced dementia. Updated his echo and noted perivalvular/central leak on his valve - tried to manage conservatively, but failed and was here back in January with heart failure. Saw Dr. Burt [name]. Placed on lasix. Tuned up VERY nicely with medicines. His diet is quite high in salt (heating up Geradine Girt dinners due to caring for demented wife, etc). TEE defined more of a central leak. Referred back to Dr. Cyndia [name] for possible redo AVR. Dr. Cyndia [name] turned him down for redo AVR/possible CABG. Favored to continue medical management and consider TAVR (valve in valve) if he fails medical therapy. We have had some worsening kidney function as a result of diuresis.  Comes back today. Here with his son, [name] and his wife, [name] who is demented. We got [name] to the lab so [name] [name] and his son could talk more freely. Wife's dementia is really getting the best of him. She is more forgetful, seeing dead people, getting up at night, etc. From a cardiac standpoint, he feels ok. No chest pain. Breathing is ok. Weight has been stable. Only one extra dose of Lasix used since I saw him. No swelling. Tries to restrict salt but he does the cooking for both.    Current Outpatient Prescriptions  Medication Sig Dispense Refill  . aspirin 81 MG tablet Take 81 mg by mouth daily.      Marland Kitchen atorvastatin (LIPITOR) 80 MG tablet Take 1 tablet (80 mg total) by mouth daily.  90 tablet  3  . B Complex-C (SUPER B COMPLEX) TABS Take 1 tablet by mouth daily.      Marland Kitchen ezetimibe (ZETIA) 10 MG  tablet Take 1 tablet (10 mg total) by mouth daily.  90 tablet  3  . finasteride (PROSCAR) 5 MG tablet Take 1 tablet (5 mg total) by mouth daily.  90 tablet  3  . furosemide (LASIX) 40 MG tablet Take 0.5 tablets (20 mg total) by mouth daily. Take extra half tablet ( 20 mg) for weight gain up to 2-3 pounds over night  30 tablet  3  . levothyroxine (SYNTHROID, LEVOTHROID) 50 MCG tablet Take 1 tablet (50 mcg total) by mouth daily.  90 tablet  3  . losartan (COZAAR) 50 MG tablet Take 1 tablet (50 mg total) by mouth daily.  90 tablet  3  . Multiple Vitamin (MULTI VITAMIN DAILY PO) Take by mouth.      Marland Kitchen PARoxetine (PAXIL) 20 MG tablet Take 0.5 tablets (10 mg total) by mouth every morning.  45 tablet  3  . potassium chloride SA (K-DUR,KLOR-CON) 10 MEQ tablet Take 1 pill every time you take Lasix  30 tablet  6   No current facility-administered medications for this visit.    Allergies  Allergen Reactions  . Amoxicillin-Pot Clavulanate   . Morphine And Related Nausea And Vomiting  . Penicillins Hives    Past Medical History  Diagnosis Date  . CVA (cerebral infarction) 12/09  . CAD (coronary artery disease)     stents  in [facility] and has a totally occluded RCA  . HTN (hypertension)   . Right iliac artery stenosis   . BPH (benign prostatic hyperplasia)   . Aortic stenosis     s/p AVR in 2003, bovine  . Diverticulosis   . AAA (abdominal aortic aneurysm)     s/p endovascular repair 2004  . Hyperlipemia   . Thyroid disease   . Glaucoma     recently told by Dr Janyth Contes he did not have it  . BCC (basal cell carcinoma) 09/17/2012  . Personal history of poliomyelitis 09/17/2012    In childhood  . Sleeping difficulties   . Leaky heart valve   . Chicken pox   . Polio   . Mumps   . Measles     Past Surgical History  Procedure Laterality Date  . Aortic valve replacement  2003    #52mm pericardial valve   . Abdominal aortic aneurysm repair  2004    s/p endovascular repiar of AAA in 2004  .  Coronary stent placement    . Tonsillectomy    . Wisdom tooth extraction    . Appendectomy    . Tee without cardioversion N/A 09/02/2013    Procedure: TRANSESOPHAGEAL ECHOCARDIOGRAM (TEE);  Surgeon: Dorothy Spark, MD;  Location: [facility] ENDOSCOPY;  Service: Cardiovascular;  Laterality: N/A;    History  Smoking status  . Former Smoker -- 0.50 packs/day for 30 years  . Types: Cigarettes  . Quit date: 10/04/1980  Smokeless tobacco  . Not on file    History  Alcohol Use  . 8.4 oz/week  . 14 Glasses of wine per week    Comment: cocktails/night    Family History  Problem Relation Age of Onset  . Colon cancer Mother   . Aneurysm Father   . Heart attack Brother     CHF  . Prostate cancer Brother     Review of Systems: The review of systems is per the HPI.  All other systems were reviewed and are negative.  Physical Exam: BP 140/50  Pulse 69  Ht 5' 6.5" (1.689 m)  Wt 184 lb (83.462 kg)  BMI 29.26 kg/m2  SpO2 94% Patient is very pleasant and in no acute distress. Skin is warm and dry. Color is normal.  HEENT is unremarkable. Normocephalic/atraumatic. PERRL. Sclera are nonicteric. Neck is supple. No masses. No JVD. Lungs are clear. Cardiac exam shows a regular rate and rhythm. Blowing outflow murmur. Abdomen is soft. Extremities are without edema. Gait and ROM are intact. No gross neurologic deficits noted.  Wt Readings from Last 3 Encounters:  10/21/13 184 lb (83.462 kg)  09/21/13 182 lb 12.8 oz (82.918 kg)  09/03/13 180 lb (81.647 kg)     LABORATORY DATA: Pending  Lab Results  Component Value Date   WBC 8.1 08/21/2013   HGB 13.2 08/21/2013   HCT 40.5 08/21/2013   PLT 144.0* 08/21/2013   GLUCOSE 87 09/21/2013   CHOL 130 05/01/2013   TRIG 99.0 05/01/2013   HDL 40.70 05/01/2013   LDLCALC 70 05/01/2013   ALT 22 08/21/2013   AST 31 08/21/2013   NA 139 09/21/2013   K 4.3 09/21/2013   CL 104 09/21/2013   CREATININE 1.9* 09/21/2013   BUN 39* 09/21/2013   CO2 29 09/21/2013   TSH 5.00  08/21/2013   INR 1.1 07/03/2008   HGBA1C  Value: 6.2 (NOTE)   The ADA recommends the following therapeutic goal for glycemic   control related to Hgb A1C  measurement:   Goal of Therapy:   < 7.0% Hgb A1C   Reference: American Diabetes Association: Clinical Practice   Recommendations 2008, Diabetes Care,  2008, 31:(Suppl 1).* 07/03/2008    TEE Study Conclusions  - Left ventricle: Mild concentric left ventricular hypertrophy. Systolic function was normal. The estimated ejection fraction was in the range of 50% to 55%. - Aortic valve: A bioprosthetic valve sits well in the aortic position, there is no abnormal rocking motion. Leaflets open well and there is no stenosis. A moderate central aortic insufficiency with eccentric anteriorly directed jetispresent. There are two very small jets of paravalvular leak present at 5 o'clock (adjacent to RVOT in the proximity to the pulmonic valve) and at 10 o'clock (adjacent to the right atrium) when looking at the short axis of TEE. 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 There is severe focal partially mobile plague in the ascending thoracic aorta mesuring 5 x 3 mm. - Mitral valve: Mildly thickened leaflets . Mild regurgitation. - Left atrium: The atrium was mildly dilated. No evidence of thrombus in the atrial cavity or appendage. No evidence of thrombus in the appendage. No evidence of thrombus in the atrial cavity or appendage. - Right atrium: No evidence of thrombus in the atrial cavity or appendage. No evidence of thrombus in the atrial cavity or appendage. - Atrial septum: No defect or patent foramen ovale was identified. Echo contrast study showed no right-to-left atrial level shunt, following an increase in RA pressure induced by provocative maneuvers. - Tricuspid valve: A prosthesis was present and functioning normally. The prosthesis had a normal range of motion.  The sewing ring appeared normal, had no rocking motion, and showed no evidence of dehiscence. - Pulmonic valve: No evidence of vegetation.  Assessment / Plan:  1. Valvular heart failure - doing ok clinically. We have opted for continued medical management at this time. I have left him on his current regimen. See again in a month.  Will need follow up echo in August.   2. CAD - no chest pain   3. Advanced age   34. CKD - recheck lab today.   5. HLD - on statin.   Patient is agreeable to this plan and will call if any problems develop in the interim.   Burtis Junes, RN, [place] [address] [place], [place]  [phone]

## 2013-10-30 ENCOUNTER — Ambulatory Visit: Payer: Medicare Other | Admitting: Nurse Practitioner

## 2013-11-20 NOTE — Telephone Encounter (Signed)
error 

## 2013-11-23 ENCOUNTER — Ambulatory Visit (INDEPENDENT_AMBULATORY_CARE_PROVIDER_SITE_OTHER): Payer: Medicare Other | Admitting: Nurse Practitioner

## 2013-11-23 ENCOUNTER — Encounter: Payer: Self-pay | Admitting: Nurse Practitioner

## 2013-11-23 VITALS — BP 130/70 | HR 75 | Ht 66.5 in | Wt 183.0 lb

## 2013-11-23 DIAGNOSIS — I2510 Atherosclerotic heart disease of native coronary artery without angina pectoris: Secondary | ICD-10-CM

## 2013-11-23 DIAGNOSIS — I259 Chronic ischemic heart disease, unspecified: Secondary | ICD-10-CM

## 2013-11-23 DIAGNOSIS — I38 Endocarditis, valve unspecified: Secondary | ICD-10-CM

## 2013-11-23 DIAGNOSIS — I359 Nonrheumatic aortic valve disorder, unspecified: Secondary | ICD-10-CM

## 2013-11-23 NOTE — Progress Notes (Signed)
[name] [name] Date of Birth: 01-Aug-1922 Medical Record #[id number]  History of Present Illness: [name] [name] is seen back today for a follow up visit. Seen for Dr. Burt Knack. Former patient of Dr. Susa Simmonds. Now 80 years of age. Has known CAD with past stent to the LCX and has a totally occluded RCA. Had prior AVR by Dr. Cyndia Bent in 2003, past AAA repair in 2004, PVD, past CVA, HLD, BPH and hypothyroidism.   Seen back in October of 2014- taking care of his wife with advanced dementia. Updated his echo and noted perivalvular/central leak on his valve - tried to manage conservatively, but failed and was here back in January with heart failure. Saw Dr. Burt Knack. Placed on lasix. Tuned up VERY nicely with medicines. His diet is quite high in salt (heating up Geradine Girt dinners due to caring for demented wife, etc). TEE defined more of a central leak. Referred back to Dr. Cyndia Bent for possible redo AVR. Dr. Cyndia Bent turned him down for redo AVR/possible CABG. Favored to continue medical management and consider TAVR (valve in valve) if he fails medical therapy. We have had some worsening kidney function as a result of diuresis.   I have been seeing him monthly. He has done well with medical management. Wife's dementia is really getting the best of him. She is more forgetful, seeing dead people, getting up at night, etc.  Comes back today. Here with his son, [name] [name].  From a cardiac standpoint, he feels ok. No chest pain. Breathing is ok. Weight has been stable. He notes however that he is tired. Not sleeping. Wife gets up at night - wakes him up. Hard to be active - says she is "right on my tail all the time". Talks about that he is not playing golf - but has not done this for over 10 years. Seems more upset about the care he has to render for his wife on a 24/7 routine. Sounds like they had some help coming in but this did not work out - not clear as to why. He notes that he is getting close to having to place her  somewhere. He admits that he is not telling his kids how bad it is at home.  Current Outpatient Prescriptions  Medication Sig Dispense Refill  . aspirin 81 MG tablet Take 81 mg by mouth daily.      Marland Kitchen atorvastatin (LIPITOR) 80 MG tablet Take 1 tablet (80 mg total) by mouth daily.  90 tablet  3  . B Complex-C (SUPER B COMPLEX) TABS Take 1 tablet by mouth daily.      Marland Kitchen ezetimibe (ZETIA) 10 MG tablet Take 1 tablet (10 mg total) by mouth daily.  90 tablet  3  . finasteride (PROSCAR) 5 MG tablet Take 1 tablet (5 mg total) by mouth daily.  90 tablet  3  . furosemide (LASIX) 40 MG tablet Take 0.5 tablets (20 mg total) by mouth daily. Take extra half tablet ( 20 mg) for weight gain up to 2-3 pounds over night  30 tablet  3  . levothyroxine (SYNTHROID, LEVOTHROID) 50 MCG tablet Take 1 tablet (50 mcg total) by mouth daily.  90 tablet  3  . losartan (COZAAR) 50 MG tablet Take 1 tablet (50 mg total) by mouth daily.  90 tablet  3  . Multiple Vitamin (MULTI VITAMIN DAILY PO) Take by mouth.      Marland Kitchen PARoxetine (PAXIL) 20 MG tablet Take 0.5 tablets (10 mg total) by mouth every morning.  45 tablet  3  . potassium chloride SA (K-DUR,KLOR-CON) 10 MEQ tablet Take 1 pill every time you take Lasix  30 tablet  6   No current facility-administered medications for this visit.    Allergies  Allergen Reactions  . Amoxicillin-Pot Clavulanate   . Morphine And Related Nausea And Vomiting  . Penicillins Hives    Past Medical History  Diagnosis Date  . CVA (cerebral infarction) 12/09  . CAD (coronary artery disease)     stents in [place] and has a totally occluded RCA  . HTN (hypertension)   . Right iliac artery stenosis   . BPH (benign prostatic hyperplasia)   . Aortic stenosis     s/p AVR in 2003, bovine  . Diverticulosis   . AAA (abdominal aortic aneurysm)     s/p endovascular repair 2004  . Hyperlipemia   . Thyroid disease   . Glaucoma     recently told by Dr Janyth Contes he did not have it  . BCC (basal cell  carcinoma) 09/17/2012  . Personal history of poliomyelitis 09/17/2012    In childhood  . Sleeping difficulties   . Leaky heart valve   . Chicken pox   . Polio   . Mumps   . Measles     Past Surgical History  Procedure Laterality Date  . Aortic valve replacement  2003    #110mm pericardial valve   . Abdominal aortic aneurysm repair  2004    s/p endovascular repiar of AAA in 2004  . Coronary stent placement    . Tonsillectomy    . Wisdom tooth extraction    . Appendectomy    . Tee without cardioversion N/A 09/02/2013    Procedure: TRANSESOPHAGEAL ECHOCARDIOGRAM (TEE);  Surgeon: Dorothy Spark, MD;  Location: [facility] ENDOSCOPY;  Service: Cardiovascular;  Laterality: N/A;    History  Smoking status  . Former Smoker -- 0.50 packs/day for 30 years  . Types: Cigarettes  . Quit date: 10/04/1980  Smokeless tobacco  . Not on file    History  Alcohol Use  . 8.4 oz/week  . 14 Glasses of wine per week    Comment: cocktails/night    Family History  Problem Relation Age of Onset  . Colon cancer Mother   . Aneurysm Father   . Heart attack Brother     CHF  . Prostate cancer Brother     Review of Systems: The review of systems is per the HPI.  All other systems were reviewed and are negative.  Physical Exam: BP 130/70  Pulse 75  Ht 5' 6.5" (1.689 m)  Wt 183 lb (83.008 kg)  BMI 29.10 kg/m2  SpO2 91% Patient is very pleasant and in no acute distress. He is more upset today. Skin is warm and dry. Color is normal.  HEENT is unremarkable. Normocephalic/atraumatic. PERRL. Sclera are nonicteric. Neck is supple. No masses. No JVD. Lungs are clear. Cardiac exam shows a regular rate and rhythm. Harsh outflow murmur. Abdomen is soft. Extremities are without edema. Gait and ROM are intact. No gross neurologic deficits noted.  Wt Readings from Last 3 Encounters:  11/23/13 183 lb (83.008 kg)  10/21/13 184 lb (83.462 kg)  09/21/13 182 lb 12.8 oz (82.918 kg)     LABORATORY DATA: Lab  Results  Component Value Date   WBC 8.1 08/21/2013   HGB 13.2 08/21/2013   HCT 40.5 08/21/2013   PLT 144.0* 08/21/2013   GLUCOSE 73 10/21/2013   CHOL 130 05/01/2013  TRIG 99.0 05/01/2013   HDL 40.70 05/01/2013   LDLCALC 70 05/01/2013   ALT 22 08/21/2013   AST 31 08/21/2013   NA 141 10/21/2013   K 4.5 10/21/2013   CL 108 10/21/2013   CREATININE 1.3 10/21/2013   BUN 26* 10/21/2013   CO2 27 10/21/2013   TSH 5.00 08/21/2013   INR 1.1 07/03/2008   HGBA1C  Value: 6.2 (NOTE)   The ADA recommends the following therapeutic goal for glycemic   control related to Hgb A1C measurement:   Goal of Therapy:   < 7.0% Hgb A1C   Reference: American Diabetes Association: Clinical Practice   Recommendations 2008, Diabetes Care,  2008, 31:(Suppl 1).* 07/03/2008   TEE Study Conclusions  - Left ventricle: Mild concentric left ventricular hypertrophy. Systolic function was normal. The estimated ejection fraction was in the range of 50% to 55%. - Aortic valve: A bioprosthetic valve sits well in the aortic position, there is no abnormal rocking motion. Leaflets open well and there is no stenosis. A moderate central aortic insufficiency with eccentric anteriorly directed jetispresent. There are two very small jets of paravalvular leak present at 5 o'clock (adjacent to RVOT in the proximity to the pulmonic valve) and at 10 o'clock (adjacent to the right atrium) when looking at the short axis of TEE. 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 There is severe focal partially mobile plague in the ascending thoracic aorta mesuring 5 x 3 mm. - Mitral valve: Mildly thickened leaflets . Mild regurgitation. - Left atrium: The atrium was mildly dilated. No evidence of thrombus in the atrial cavity or appendage. No evidence of thrombus in the appendage. No evidence of thrombus in the atrial cavity or appendage. - Right atrium: No evidence of thrombus in the  atrial cavity or appendage. No evidence of thrombus in the atrial cavity or appendage. - Atrial septum: No defect or patent foramen ovale was identified. Echo contrast study showed no right-to-left atrial level shunt, following an increase in RA pressure induced by provocative maneuvers. - Tricuspid valve: A prosthesis was present and functioning normally. The prosthesis had a normal range of motion. The sewing ring appeared normal, had no rocking motion, and showed no evidence of dehiscence. - Pulmonic valve: No evidence of vegetation.  Assessment / Plan:  1. Valvular heart failure - doing ok clinically. We have opted for continued medical management at this time. I have left him on his current regimen. See again in a month. Will need follow up echo in August. I think most of what he is upset about today is related to the caregiver role - family will be getting more help to come into the home. I have explained numerous times to [name] [name] that this problem will only get worse and that extra help will be needed.   2. CAD - no chest pain   3. Advanced age   72. CKD - stable - improved with cutting back his diuretic.  5. HLD - on statin therapy.  I will see him back in a month.   Patient is agreeable to this plan and will call if any problems develop in the interim.   Burtis Junes, RN, [place]  [address] [place]  [place], [place] [address]  [phone]

## 2013-11-23 NOTE — Patient Instructions (Addendum)
Stay on your current medicines  I will see you in a month  We will check lab on return visit  Think about what we talked about today  I have been told that Dr. Jacelyn Grip is coming back to [facility] - to [place] at [employer] the [place] office at [phone] if you have any questions, problems or concerns.

## 2014-01-01 ENCOUNTER — Encounter: Payer: Self-pay | Admitting: Nurse Practitioner

## 2014-01-01 ENCOUNTER — Ambulatory Visit (INDEPENDENT_AMBULATORY_CARE_PROVIDER_SITE_OTHER): Payer: Medicare Other | Admitting: Nurse Practitioner

## 2014-01-01 ENCOUNTER — Telehealth: Payer: Self-pay | Admitting: *Deleted

## 2014-01-01 VITALS — BP 130/50 | HR 67 | Ht 65.5 in | Wt 184.0 lb

## 2014-01-01 DIAGNOSIS — I259 Chronic ischemic heart disease, unspecified: Secondary | ICD-10-CM

## 2014-01-01 DIAGNOSIS — R0600 Dyspnea, unspecified: Secondary | ICD-10-CM

## 2014-01-01 DIAGNOSIS — I2510 Atherosclerotic heart disease of native coronary artery without angina pectoris: Secondary | ICD-10-CM

## 2014-01-01 DIAGNOSIS — I38 Endocarditis, valve unspecified: Secondary | ICD-10-CM

## 2014-01-01 DIAGNOSIS — R0989 Other specified symptoms and signs involving the circulatory and respiratory systems: Secondary | ICD-10-CM

## 2014-01-01 DIAGNOSIS — R0609 Other forms of dyspnea: Secondary | ICD-10-CM

## 2014-01-01 LAB — CBC
HCT: 39.8 % (ref 39.0–52.0)
Hemoglobin: 13.3 g/dL (ref 13.0–17.0)
MCHC: 33.4 g/dL (ref 30.0–36.0)
MCV: 97.5 fl (ref 78.0–100.0)
Platelets: 138 10*3/uL — ABNORMAL LOW (ref 150.0–400.0)
RBC: 4.08 Mil/uL — ABNORMAL LOW (ref 4.22–5.81)
RDW: 13.7 % (ref 11.5–15.5)
WBC: 6.2 10*3/uL (ref 4.0–10.5)

## 2014-01-01 LAB — BASIC METABOLIC PANEL
BUN: 30 mg/dL — ABNORMAL HIGH (ref 6–23)
CO2: 30 mEq/L (ref 19–32)
Calcium: 8.8 mg/dL (ref 8.4–10.5)
Chloride: 108 mEq/L (ref 96–112)
Creatinine, Ser: 1.3 mg/dL (ref 0.4–1.5)
GFR: 53.88 mL/min — ABNORMAL LOW (ref >60.00)
Glucose, Bld: 122 mg/dL — ABNORMAL HIGH (ref 70–99)
Potassium: 3.9 mEq/L (ref 3.5–5.1)
Sodium: 141 mEq/L (ref 135–145)

## 2014-01-01 LAB — BRAIN NATRIURETIC PEPTIDE: Pro B Natriuretic peptide (BNP): 286 pg/mL — ABNORMAL HIGH (ref 0.0–100.0)

## 2014-01-01 MED ORDER — LEVOTHYROXINE SODIUM 50 MCG PO TABS: 50.0000 ug | ORAL_TABLET | Freq: Every day | ORAL | Status: DC

## 2014-01-01 NOTE — Patient Instructions (Addendum)
Stay on your current medicines  We will need to repeat the echo  See Dr. Burt Knack after the echo/valve clinic  Let's check labs today  Call the [place] office at [phone] if you have any questions, problems or concerns.

## 2014-01-01 NOTE — Progress Notes (Signed)
[name] [name] Date of Birth: April 20, 1923 Medical Record #[id number]  History of Present Illness: [name] [name] is seen back today for a follow up visit. Seen for Dr. Burt Knack. Former patient of Dr. Susa Simmonds. Now 80 years of age. Has known CAD with past stent to the LCX and has a totally occluded RCA. Had prior AVR by Dr. Cyndia Bent in 2003, past AAA repair in 2004, PVD, past CVA, HLD, BPH and hypothyroidism.   Seen back in October of 2014 - taking care of his wife with advanced dementia. Updated his echo and noted perivalvular/central leak on his valve - tried to manage conservatively, but failed and was here back in January with heart failure. Saw Dr. Burt Knack. Placed on lasix. Tuned up VERY nicely with medicines. His diet is quite high in salt (heating up [name] [name] dinners due to caring for demented wife, etc). TEE defined more of a central leak. Referred back to Dr. Cyndia Bent for possible redo AVR. Dr. Cyndia Bent turned him down for redo AVR/possible CABG. Favored to continue medical management and consider TAVR (valve in valve) if he fails medical therapy. We have had some worsening kidney function as a result of diuresis.   I have been seeing him monthly. He has done well with medical management. Wife's dementia is really getting the best of him. She is more forgetful, seeing dead people, getting up at night, etc.  At last month's check he was tired - not sleeping -very stressed due to his wife. He agreed to getting extra help in the home.   Comes back today. Here with his son, [name] [name]. He is not doing well. More PND/orthopnea noted. Fatigued. No chest pain. Help coming into the home starting next week. Weight is stable. Some extra lasix taken about the past 3 to 4 weeks.   Current Outpatient Prescriptions  Medication Sig Dispense Refill  . aspirin 81 MG tablet Take 81 mg by mouth daily.      Marland Kitchen atorvastatin (LIPITOR) 80 MG tablet Take 1 tablet (80 mg total) by mouth daily.  90 tablet  3  . B Complex-C (SUPER B  COMPLEX) TABS Take 1 tablet by mouth daily.      Marland Kitchen ezetimibe (ZETIA) 10 MG tablet Take 1 tablet (10 mg total) by mouth daily.  90 tablet  3  . finasteride (PROSCAR) 5 MG tablet Take 1 tablet (5 mg total) by mouth daily.  90 tablet  3  . furosemide (LASIX) 40 MG tablet Take 0.5 tablets (20 mg total) by mouth daily. Take extra half tablet ( 20 mg) for weight gain up to 2-3 pounds over night  30 tablet  3  . levothyroxine (SYNTHROID, LEVOTHROID) 50 MCG tablet Take 1 tablet (50 mcg total) by mouth daily.  90 tablet  3  . losartan (COZAAR) 50 MG tablet Take 1 tablet (50 mg total) by mouth daily.  90 tablet  3  . Multiple Vitamin (MULTI VITAMIN DAILY PO) Take by mouth.      Marland Kitchen PARoxetine (PAXIL) 20 MG tablet Take 0.5 tablets (10 mg total) by mouth every morning.  45 tablet  3  . potassium chloride SA (K-DUR,KLOR-CON) 10 MEQ tablet Take 1 pill every time you take Lasix  30 tablet  6   No current facility-administered medications for this visit.    Allergies  Allergen Reactions  . Amoxicillin-Pot Clavulanate   . Morphine And Related Nausea And Vomiting  . Penicillins Hives    Past Medical History  Diagnosis Date  . CVA (  cerebral infarction) 12/09  . CAD (coronary artery disease)     stents in [place] and has a totally occluded RCA  . HTN (hypertension)   . Right iliac artery stenosis   . BPH (benign prostatic hyperplasia)   . Aortic stenosis     s/p AVR in 2003, bovine  . Diverticulosis   . AAA (abdominal aortic aneurysm)     s/p endovascular repair 2004  . Hyperlipemia   . Thyroid disease   . Glaucoma     recently told by Dr Janyth Contes he did not have it  . BCC (basal cell carcinoma) 09/17/2012  . Personal history of poliomyelitis 09/17/2012    In childhood  . Sleeping difficulties   . Leaky heart valve   . Chicken pox   . Polio   . Mumps   . Measles     Past Surgical History  Procedure Laterality Date  . Aortic valve replacement  2003    #92mm pericardial valve   . Abdominal  aortic aneurysm repair  2004    s/p endovascular repiar of AAA in 2004  . Coronary stent placement    . Tonsillectomy    . Wisdom tooth extraction    . Appendectomy    . Tee without cardioversion N/A 09/02/2013    Procedure: TRANSESOPHAGEAL ECHOCARDIOGRAM (TEE);  Surgeon: Dorothy Spark, MD;  Location: [facility] ENDOSCOPY;  Service: Cardiovascular;  Laterality: N/A;    History  Smoking status  . Former Smoker -- 0.50 packs/day for 30 years  . Types: Cigarettes  . Quit date: 10/04/1980  Smokeless tobacco  . Not on file    History  Alcohol Use  . 8.4 oz/week  . 14 Glasses of wine per week    Comment: cocktails/night    Family History  Problem Relation Age of Onset  . Colon cancer Mother   . Aneurysm Father   . Heart attack Brother     CHF  . Prostate cancer Brother     Review of Systems: The review of systems is per the HPI.  All other systems were reviewed and are negative.  Physical Exam: BP 130/50  Pulse 67  Ht 5' 5.5" (1.664 m)  Wt 184 lb (83.462 kg)  BMI 30.14 kg/m2  SpO2 92% Patient is very pleasant and in no acute distress. Weight is stable. Skin is warm and dry. Color is normal.  HEENT is unremarkable. Normocephalic/atraumatic. PERRL. Sclera are nonicteric. Neck is supple. No masses. No JVD. Lungs are clear. Cardiac exam shows a regular rate and rhythm. High pitched murmur - more diastolic.  Abdomen is soft. Extremities are without edema. Gait and ROM are intact. No gross neurologic deficits noted.  Wt Readings from Last 3 Encounters:  01/01/14 184 lb (83.462 kg)  11/23/13 183 lb (83.008 kg)  10/21/13 184 lb (83.462 kg)     LABORATORY DATA: Lab Results  Component Value Date   WBC 8.1 08/21/2013   HGB 13.2 08/21/2013   HCT 40.5 08/21/2013   PLT 144.0* 08/21/2013   GLUCOSE 73 10/21/2013   CHOL 130 05/01/2013   TRIG 99.0 05/01/2013   HDL 40.70 05/01/2013   LDLCALC 70 05/01/2013   ALT 22 08/21/2013   AST 31 08/21/2013   NA 141 10/21/2013   K 4.5 10/21/2013   CL  108 10/21/2013   CREATININE 1.3 10/21/2013   BUN 26* 10/21/2013   CO2 27 10/21/2013   TSH 5.00 08/21/2013   INR 1.1 07/03/2008   HGBA1C  Value: 6.2 (NOTE)  The ADA recommends the following therapeutic goal for glycemic   control related to Hgb A1C measurement:   Goal of Therapy:   < 7.0% Hgb A1C   Reference: American Diabetes Association: Clinical Practice   Recommendations 2008, Diabetes Care,  2008, 31:(Suppl 1).* 07/03/2008   TEE Study Conclusions  - Left ventricle: Mild concentric left ventricular hypertrophy. Systolic function was normal. The estimated ejection fraction was in the range of 50% to 55%. - Aortic valve: A bioprosthetic valve sits well in the aortic position, there is no abnormal rocking motion. Leaflets open well and there is no stenosis. A moderate central aortic insufficiency with eccentric anteriorly directed jetispresent. There are two very small jets of paravalvular leak present at 5 o'clock (adjacent to RVOT in the proximity to the pulmonic valve) and at 10 o'clock (adjacent to the right atrium) when looking at the short axis of TEE. 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 There is severe focal partially mobile plague in the ascending thoracic aorta mesuring 5 x 3 mm. - Mitral valve: Mildly thickened leaflets . Mild regurgitation. - Left atrium: The atrium was mildly dilated. No evidence of thrombus in the atrial cavity or appendage. No evidence of thrombus in the appendage. No evidence of thrombus in the atrial cavity or appendage. - Right atrium: No evidence of thrombus in the atrial cavity or appendage. No evidence of thrombus in the atrial cavity or appendage. - Atrial septum: No defect or patent foramen ovale was identified. Echo contrast study showed no right-to-left atrial level shunt, following an increase in RA pressure induced by provocative maneuvers. - Tricuspid valve: A  prosthesis was present and functioning normally. The prosthesis had a normal range of motion. The sewing ring appeared normal, had no rocking motion, and showed no evidence of dehiscence. - Pulmonic valve: No evidence of vegetation.  Assessment / Plan:  1. Valvular heart failure - not doing well clinically - more PND/orthopnea at night and fatigue - may be failing on medical therapy. I have left him on his current regimen for now. Arrange for echo and OV with Dr. Burt Knack. May need to get to TAVR.  2. CAD - no chest pain   3. Advanced age   68. CKD - stable - improved with cutting back his diuretic. Rechecking labs today   5. HLD - on statin therapy.   6. Situational stress from wife's dementia  Further disposition to follow - it may be that we are heading down the road for TAVR work up.   Patient is agreeable to this plan and will call if any problems develop in the interim.   Burtis Junes, RN, [place]  [address] [place]  [place], [place] [address]  [phone]

## 2014-01-01 NOTE — Telephone Encounter (Signed)
lmtcb for lab results

## 2014-01-04 ENCOUNTER — Telehealth: Payer: Self-pay | Admitting: Nurse Practitioner

## 2014-01-04 NOTE — Telephone Encounter (Signed)
Spoke with patient about recent lab results 

## 2014-01-04 NOTE — Telephone Encounter (Signed)
New message ° ° ° ° °Want lab results °

## 2014-01-05 ENCOUNTER — Encounter (INDEPENDENT_AMBULATORY_CARE_PROVIDER_SITE_OTHER): Payer: Self-pay

## 2014-01-05 ENCOUNTER — Ambulatory Visit (HOSPITAL_COMMUNITY)
Admission: RE | Admit: 2014-01-05 | Discharge: 2014-01-05 | Disposition: A | Discharge status: Home / Self Care | Payer: Medicare Other | Source: Ambulatory Visit | Attending: Nurse Practitioner | Admitting: Nurse Practitioner

## 2014-01-05 ENCOUNTER — Other Ambulatory Visit (HOSPITAL_COMMUNITY): Payer: Self-pay | Admitting: Nurse Practitioner

## 2014-01-05 DIAGNOSIS — I359 Nonrheumatic aortic valve disorder, unspecified: Secondary | ICD-10-CM

## 2014-01-05 DIAGNOSIS — R0989 Other specified symptoms and signs involving the circulatory and respiratory systems: Principal | ICD-10-CM | POA: Insufficient documentation

## 2014-01-05 DIAGNOSIS — R0609 Other forms of dyspnea: Secondary | ICD-10-CM | POA: Insufficient documentation

## 2014-01-05 DIAGNOSIS — I2510 Atherosclerotic heart disease of native coronary artery without angina pectoris: Secondary | ICD-10-CM | POA: Insufficient documentation

## 2014-01-05 DIAGNOSIS — I509 Heart failure, unspecified: Secondary | ICD-10-CM | POA: Insufficient documentation

## 2014-01-05 DIAGNOSIS — Z952 Presence of prosthetic heart valve: Secondary | ICD-10-CM | POA: Insufficient documentation

## 2014-01-05 DIAGNOSIS — I10 Essential (primary) hypertension: Secondary | ICD-10-CM | POA: Insufficient documentation

## 2014-01-05 MED ORDER — PERFLUTREN LIPID MICROSPHERE
1.0000 mL | INTRAVENOUS | Status: AC | PRN
  Filled 2014-01-05: qty 10

## 2014-01-05 NOTE — Discharge Instructions (Signed)
Perflutren Protein-Type A Microspheres Injection What is this medicine? PERFLUTREN PROTEIN-TYPE A MICROSPHERES is a contrast agent. It is used to diagnose abnormalities during an ultrasound of the heart. This medicine may be used for other purposes; ask your health care provider or pharmacist if you have questions. COMMON BRAND NAME(S): Definity  What should I tell my health care provider before I take this medicine? They need to know if you have any of these conditions: -congenital heart defect -heart disease -liver disease -lung disease -an unusual or allergic reaction to perflutren, albumin, blood products, other medicines, foods, dyes, or preservatives -pregnant or trying to get pregnant -breast-feeding How should I use this medicine? This medicine is for injection into a vein. It is given by a health care professional in a hospital or clinic setting. Talk to your pediatrician regarding the use of this medicine in children. Special care may be needed. Overdosage: If you think you have taken too much of this medicine contact a poison control center or emergency room at once. NOTE: This medicine is only for you. Do not share this medicine with others. What if I miss a dose? This does not apply. What may interact with this medicine? Interactions are not expected. You may or may not be able to take your regular medications before your procedure. Ask your health care professional. This list may not describe all possible interactions. Give your health care provider a list of all the medicines, herbs, non-prescription drugs, or dietary supplements you use. Also tell them if you smoke, drink alcohol, or use illegal drugs. Some items may interact with your medicine. What should I watch for while using this medicine? Your condition will be monitored carefully while you are receiving this medicine. You may get drowsy or dizzy. Do not drive, use machinery, or do anything that needs mental  alertness until you know how this medicine affects you. Do not stand or sit up quickly, especially if you are an older patient. This reduces the risk of dizzy or fainting spells. What side effects may I notice from receiving this medicine? Side effects that you should report to your doctor or health care professional as soon as possible: -allergic reactions like skin rash, itching or hives, swelling of the face, lips, or tongue -breathing problems -chest pain -fast, irregular heartbeat -feeling faint or lightheaded, falls -fever, chills, flu-like symptoms -flushing -seizures Side effects that usually do not require medical attention (report to your doctor or health care professional if they continue or are bothersome): -dry mouth -feeling of heat at site where injected -flushing -headache -nausea, vomiting -unusual taste in the mouth -weak or tired This list may not describe all possible side effects. Call your doctor for medical advice about side effects. You may report side effects to FDA at 1-800-FDA-1088. Where should I keep my medicine? This drug is given in a hospital or clinic and will not be stored at home. NOTE: This sheet is a summary. It may not cover all possible information. If you have questions about this medicine, talk to your doctor, pharmacist, or health care provider.  2014, Elsevier/Gold Standard. (2007-03-21 18:49:00)

## 2014-01-05 NOTE — Progress Notes (Addendum)
  Echocardiogram 2D Echocardiogram with Definity has been performed.  [name] 01/05/2014, 11:27 AM

## 2014-01-05 NOTE — Progress Notes (Signed)
Definity injection 44ml  (from NS syringe 8.17ml with added 1.73ml of activated Definity) via 22g IV, with NS/IV slowly infusing for echo mages. Pt tolerated well. VSS. KJHenderson,RN

## 2014-01-28 ENCOUNTER — Other Ambulatory Visit: Payer: Self-pay | Admitting: Family Medicine

## 2014-01-28 ENCOUNTER — Ambulatory Visit
Admission: RE | Admit: 2014-01-28 | Discharge: 2014-01-28 | Disposition: A | Discharge status: Home / Self Care | Payer: Medicare Other | Source: Ambulatory Visit | Attending: Family Medicine | Admitting: Family Medicine

## 2014-01-28 DIAGNOSIS — M549 Dorsalgia, unspecified: Secondary | ICD-10-CM

## 2014-02-04 ENCOUNTER — Encounter (HOSPITAL_COMMUNITY): Payer: Self-pay | Admitting: Pharmacy Technician

## 2014-02-04 ENCOUNTER — Ambulatory Visit (INDEPENDENT_AMBULATORY_CARE_PROVIDER_SITE_OTHER): Payer: Medicare Other | Admitting: Cardiovascular Disease

## 2014-02-04 ENCOUNTER — Encounter: Payer: Self-pay | Admitting: Cardiovascular Disease

## 2014-02-04 VITALS — BP 142/54 | HR 72 | Ht 65.5 in | Wt 183.2 lb

## 2014-02-04 DIAGNOSIS — I351 Nonrheumatic aortic (valve) insufficiency: Secondary | ICD-10-CM

## 2014-02-04 DIAGNOSIS — I359 Nonrheumatic aortic valve disorder, unspecified: Secondary | ICD-10-CM

## 2014-02-04 DIAGNOSIS — I350 Nonrheumatic aortic (valve) stenosis: Secondary | ICD-10-CM

## 2014-02-04 DIAGNOSIS — I2510 Atherosclerotic heart disease of native coronary artery without angina pectoris: Secondary | ICD-10-CM

## 2014-02-04 LAB — CBC
HCT: 42.6 % (ref 39.0–52.0)
Hemoglobin: 14.3 g/dL (ref 13.0–17.0)
MCHC: 33.5 g/dL (ref 30.0–36.0)
MCV: 97.4 fl (ref 78.0–100.0)
PLATELETS: 148 10*3/uL — AB (ref 150.0–400.0)
RBC: 4.38 Mil/uL (ref 4.22–5.81)
RDW: 13.4 % (ref 11.5–15.5)
WBC: 7.2 10*3/uL (ref 4.0–10.5)

## 2014-02-04 LAB — BASIC METABOLIC PANEL
BUN: 32 mg/dL — ABNORMAL HIGH (ref 6–23)
CALCIUM: 9.2 mg/dL (ref 8.4–10.5)
CO2: 27 mEq/L (ref 19–32)
Chloride: 105 mEq/L (ref 96–112)
Creatinine, Ser: 1.4 mg/dL (ref 0.4–1.5)
GFR: 50.77 mL/min — ABNORMAL LOW (ref >60.00)
GLUCOSE: 78 mg/dL (ref 70–99)
Potassium: 4.4 mEq/L (ref 3.5–5.1)
Sodium: 141 mEq/L (ref 135–145)

## 2014-02-04 LAB — PROTIME-INR
INR: 1 ratio (ref 0.8–1.0)
Prothrombin Time: 11.2 s (ref 9.6–13.1)

## 2014-02-04 NOTE — Patient Instructions (Signed)
Your physician has requested that you have a cardiac catheterization. Cardiac catheterization is used to diagnose and/or treat various heart conditions. Doctors may recommend this procedure for a number of different reasons. The most common reason is to evaluate chest pain. Chest pain can be a symptom of coronary artery disease (CAD), and cardiac catheterization can show whether plaque is narrowing or blocking your heart's arteries. This procedure is also used to evaluate the valves, as well as measure the blood flow and oxygen levels in different parts of your heart. For further information please visit [email or web address]. Please follow instruction sheet, as given.  Your physician recommends that you have lab work today: BMP, CBC and PT/INR

## 2014-02-06 ENCOUNTER — Encounter: Payer: Self-pay | Admitting: Cardiovascular Disease

## 2014-02-06 NOTE — Progress Notes (Signed)
HPI:  80 year old gentleman presenting for followup evaluation. The patient has a history of fairly complex cardiovascular disease. He has undergone AAA repair, aortic valve replacement, and is known to have coronary artery disease with a chronically occluded right coronary artery and previous stenting of the left circumflex. I initially saw the patient in January when he presented with congestive heart failure. He was found to have significant aortic insufficiency and a TEE was performed. This demonstrated primarily central AI from his bioprosthetic aortic valve. There is also mild perivalvular leak noted. The patient initially responded well to medical therapy with oral diuretics. The patient was seen by Dr. Cyndia Bent in February and because he was doing well at that time ongoing observation was recommended.  He continues to be under a lot of stress at home. His wife has advanced dementia. The patient has been having more problems with orthopnea. He sleeps in a chair most of the time so that he can keep his head elevated. He also complains of exertional dyspnea with low-level activities. He has not had chest pain or pressure. He denies lightheadedness or syncope. He's had only mild leg swelling, but not to the degree it has been in the past.  Outpatient Encounter Prescriptions as of 02/04/2014  Medication Sig  . aspirin 81 MG tablet Take 81 mg by mouth daily.  Marland Kitchen atorvastatin (LIPITOR) 80 MG tablet Take 1 tablet (80 mg total) by mouth daily.  . B Complex-C (SUPER B COMPLEX) TABS Take 1 tablet by mouth daily.  Marland Kitchen ezetimibe (ZETIA) 10 MG tablet Take 1 tablet (10 mg total) by mouth daily.  . finasteride (PROSCAR) 5 MG tablet Take 1 tablet (5 mg total) by mouth daily.  . furosemide (LASIX) 40 MG tablet Take 0.5 tablets (20 mg total) by mouth daily. Take extra half tablet ( 20 mg) for weight gain up to 2-3 pounds over night  . levothyroxine (SYNTHROID, LEVOTHROID) 50 MCG tablet Take 1 tablet (50 mcg total)  by mouth daily.  Marland Kitchen losartan (COZAAR) 50 MG tablet Take 1 tablet (50 mg total) by mouth daily.  Marland Kitchen PARoxetine (PAXIL) 20 MG tablet Take 0.5 tablets (10 mg total) by mouth every morning.  . potassium chloride SA (K-DUR,KLOR-CON) 10 MEQ tablet Take 1 pill every time you take Lasix  . [DISCONTINUED] Multiple Vitamin (MULTI VITAMIN DAILY PO) Take by mouth.    Allergies  Allergen Reactions  . Amoxicillin-Pot Clavulanate Itching  . Morphine And Related Nausea And Vomiting  . Penicillins Hives    Past Medical History  Diagnosis Date  . CVA (cerebral infarction) 12/09  . CAD (coronary artery disease)     stents in [place] and has a totally occluded RCA  . HTN (hypertension)   . Right iliac artery stenosis   . BPH (benign prostatic hyperplasia)   . Aortic stenosis     s/p AVR in 2003, bovine  . Diverticulosis   . AAA (abdominal aortic aneurysm)     s/p endovascular repair 2004  . Hyperlipemia   . Thyroid disease   . Glaucoma     recently told by Dr Janyth Contes he did not have it  . BCC (basal cell carcinoma) 09/17/2012  . Personal history of poliomyelitis 09/17/2012    In childhood  . Sleeping difficulties   . Leaky heart valve   . Chicken pox   . Polio   . Mumps   . Measles     ROS: Negative except as per HPI  BP 142/54  Pulse  72  Ht 5' 5.5" (1.664 m)  Wt 183 lb 3.2 oz (83.099 kg)  BMI 30.01 kg/m2  PHYSICAL EXAM: Pt is alert and oriented, pleasant elderly male in NAD HEENT: normal Neck: JVP - normal Lungs: CTA bilaterally CV: RRR with grade 3/6 diastolic decrescendo murmur at the LLSB Abd: soft, NT, Positive BS, no hepatomegaly Ext: no C/C/E, distal pulses intact and equal Skin: warm/dry no rash  2D Echo: Study Conclusions  - Left ventricle: The cavity size was normal. There was mild concentric hypertrophy. Systolic function was normal. The estimated ejection fraction was in the range of 50% to 55%. Wall motion was normal; there were no regional wall  motion abnormalities. Doppler parameters are consistent with abnormal left ventricular relaxation (grade 1 diastolic dysfunction). - Aortic valve: A bioprosthetic valve sits well in the aortic position, there is no abnormal rocking motion. Leaflets open well. The transaortic gradients are 27 and 45 mmHg and are elevated for this type of prosthesis. A moderate central aortic insufficiency with eccentric anteriorly directed jet is present. There a very small jets of paravalvular leak present. There was moderate regurgitation. - Aortic root: The aortic root was normal in size. - Mitral valve: There was mild regurgitation. - Left atrium: The atrium was moderately dilated. - Tricuspid valve: Poorly visualized. There was no regurgitation. - Pulmonic valve: There was mild regurgitation.  Impressions:  - No significant chabges when compared to the study from 09/06/2013. The transaortic gradients are 27 and 45 mmHg and are elevated for this type of prosthesis however they are improved when compared to the prior study. Stable moderate central aortic insufficiency with minimal paravalvular leak.  ASSESSMENT AND PLAN: 1. Acute on chronic diastolic/valvular congestive heart failure, New York Heart Association class III  2. Aortic valve disease status post bioprosthetic AVR 2003(21 mm Edwards Perimount Valve)  3. Coronary atherosclerosis, native vessel. Previous left circumflex stenting and chronically occluded small RCA not suitable for grafting at the time of heart surgery in 2003  4. Chronic kidney disease, stage III  The patient continues to have symptomatic heart failure with orthopnea and exertional dyspnea despite medical therapy. He is here with his daughter today and we had a lengthy discussion about treatment options. I think he may ultimately need treatment for his aortic insufficiency and valve and valve TAVR might be a reasonable treatment option. Recommended a right and left heart  catheterization to assess his hemodynamics and coronary anatomy. I reviewed the risks, indications, and alternatives with the patient understands and agrees to proceed. Risks include but are not limited to vascular injury, bleeding, stroke, myocardial infarction, and death. He understands these risks recurrent low-frequency (less than 1%) and agrees to proceed. Plan on radial approach considering his history of EVAR.  Sherren Mocha 02/06/2014 7:40 AM

## 2014-02-08 ENCOUNTER — Encounter (HOSPITAL_COMMUNITY)
Admission: RE | Disposition: A | Discharge status: Home / Self Care | Payer: Self-pay | Source: Ambulatory Visit | Attending: Cardiovascular Disease

## 2014-02-08 ENCOUNTER — Other Ambulatory Visit: Payer: Self-pay | Admitting: Cardiovascular Disease

## 2014-02-08 ENCOUNTER — Ambulatory Visit (HOSPITAL_COMMUNITY)
Admission: RE | Admit: 2014-02-08 | Discharge: 2014-02-08 | Disposition: A | Discharge status: Home / Self Care | Payer: Medicare Other | Source: Ambulatory Visit | Attending: Cardiovascular Disease | Admitting: Cardiovascular Disease

## 2014-02-08 DIAGNOSIS — I080 Rheumatic disorders of both mitral and aortic valves: Secondary | ICD-10-CM | POA: Diagnosis not present

## 2014-02-08 DIAGNOSIS — Z952 Presence of prosthetic heart valve: Secondary | ICD-10-CM | POA: Diagnosis not present

## 2014-02-08 DIAGNOSIS — Y834 Other reconstructive surgery as the cause of abnormal reaction of the patient, or of later complication, without mention of misadventure at the time of the procedure: Secondary | ICD-10-CM | POA: Insufficient documentation

## 2014-02-08 DIAGNOSIS — N183 Chronic kidney disease, stage 3 unspecified: Secondary | ICD-10-CM | POA: Diagnosis not present

## 2014-02-08 DIAGNOSIS — I2510 Atherosclerotic heart disease of native coronary artery without angina pectoris: Secondary | ICD-10-CM | POA: Insufficient documentation

## 2014-02-08 DIAGNOSIS — I129 Hypertensive chronic kidney disease with stage 1 through stage 4 chronic kidney disease, or unspecified chronic kidney disease: Secondary | ICD-10-CM | POA: Diagnosis not present

## 2014-02-08 DIAGNOSIS — I509 Heart failure, unspecified: Secondary | ICD-10-CM | POA: Insufficient documentation

## 2014-02-08 DIAGNOSIS — E079 Disorder of thyroid, unspecified: Secondary | ICD-10-CM | POA: Insufficient documentation

## 2014-02-08 DIAGNOSIS — I359 Nonrheumatic aortic valve disorder, unspecified: Secondary | ICD-10-CM | POA: Diagnosis present

## 2014-02-08 DIAGNOSIS — Z9861 Coronary angioplasty status: Secondary | ICD-10-CM | POA: Insufficient documentation

## 2014-02-08 DIAGNOSIS — Z8673 Personal history of transient ischemic attack (TIA), and cerebral infarction without residual deficits: Secondary | ICD-10-CM | POA: Diagnosis not present

## 2014-02-08 DIAGNOSIS — I5033 Acute on chronic diastolic (congestive) heart failure: Secondary | ICD-10-CM | POA: Insufficient documentation

## 2014-02-08 DIAGNOSIS — E785 Hyperlipidemia, unspecified: Secondary | ICD-10-CM | POA: Diagnosis not present

## 2014-02-08 DIAGNOSIS — Z7982 Long term (current) use of aspirin: Secondary | ICD-10-CM | POA: Insufficient documentation

## 2014-02-08 DIAGNOSIS — N400 Enlarged prostate without lower urinary tract symptoms: Secondary | ICD-10-CM | POA: Insufficient documentation

## 2014-02-08 HISTORY — PX: LEFT AND RIGHT HEART CATHETERIZATION WITH CORONARY ANGIOGRAM: SHX5449

## 2014-02-08 LAB — POCT ACTIVATED CLOTTING TIME: ACTIVATED CLOTTING TIME: 197 s

## 2014-02-08 LAB — POCT I-STAT 3, VENOUS BLOOD GAS (G3P V)
Acid-base deficit: 1 mmol/L (ref 0.0–2.0)
Bicarbonate: 24.1 mEq/L — ABNORMAL HIGH (ref 20.0–24.0)
O2 Saturation: 63 %
TCO2: 25 mmol/L (ref 0–100)
pCO2, Ven: 43.1 mmHg — ABNORMAL LOW (ref 45.0–50.0)
pH, Ven: 7.356 — ABNORMAL HIGH (ref 7.250–7.300)
pO2, Ven: 34 mmHg (ref 30.0–45.0)

## 2014-02-08 LAB — POCT I-STAT 3, ART BLOOD GAS (G3+)
ACID-BASE DEFICIT: 2 mmol/L (ref 0.0–2.0)
Bicarbonate: 21.3 mEq/L (ref 20.0–24.0)
O2 SAT: 93 %
TCO2: 22 mmol/L (ref 0–100)
pCO2 arterial: 31 mmHg — ABNORMAL LOW (ref 35.0–45.0)
pH, Arterial: 7.445 (ref 7.350–7.450)
pO2, Arterial: 64 mmHg — ABNORMAL LOW (ref 80.0–100.0)

## 2014-02-08 SURGERY — LEFT AND RIGHT HEART CATHETERIZATION WITH CORONARY ANGIOGRAM
Anesthesia: LOCAL

## 2014-02-08 MED ORDER — ACETAMINOPHEN 325 MG PO TABS: 650.0000 mg | ORAL_TABLET | ORAL | Status: DC | PRN

## 2014-02-08 MED ORDER — SODIUM CHLORIDE 0.9 % IV SOLN
INTRAVENOUS | Status: DC
  Administered 2014-02-08: 09:00:00 via INTRAVENOUS

## 2014-02-08 MED ORDER — SODIUM CHLORIDE 0.9 % IV SOLN: 1.0000 mL/kg/h | INTRAVENOUS | Status: DC

## 2014-02-08 MED ORDER — SODIUM CHLORIDE 0.9 % IJ SOLN: 3.0000 mL | Freq: Two times a day (BID) | INTRAMUSCULAR | Status: DC

## 2014-02-08 MED ORDER — HEPARIN SODIUM (PORCINE) 1000 UNIT/ML IJ SOLN
INTRAMUSCULAR | Status: AC
  Filled 2014-02-08: qty 1

## 2014-02-08 MED ORDER — MIDAZOLAM HCL 2 MG/2ML IJ SOLN
INTRAMUSCULAR | Status: AC
  Filled 2014-02-08: qty 2

## 2014-02-08 MED ORDER — FENTANYL CITRATE 0.05 MG/ML IJ SOLN
INTRAMUSCULAR | Status: AC
  Filled 2014-02-08: qty 2

## 2014-02-08 MED ORDER — ONDANSETRON HCL 4 MG/2ML IJ SOLN: 4.0000 mg | Freq: Four times a day (QID) | INTRAMUSCULAR | Status: DC | PRN

## 2014-02-08 MED ORDER — SODIUM CHLORIDE 0.9 % IJ SOLN: 3.0000 mL | INTRAMUSCULAR | Status: DC | PRN

## 2014-02-08 MED ORDER — HEPARIN (PORCINE) IN NACL 2-0.9 UNIT/ML-% IJ SOLN
INTRAMUSCULAR | Status: AC
  Filled 2014-02-08: qty 1000

## 2014-02-08 MED ORDER — LIDOCAINE HCL (PF) 1 % IJ SOLN
INTRAMUSCULAR | Status: AC
  Filled 2014-02-08: qty 30

## 2014-02-08 MED ORDER — VERAPAMIL HCL 2.5 MG/ML IV SOLN
INTRAVENOUS | Status: AC
  Filled 2014-02-08: qty 2

## 2014-02-08 MED ORDER — SODIUM CHLORIDE 0.9 % IV SOLN: 250.0000 mL | INTRAVENOUS | Status: DC | PRN

## 2014-02-08 NOTE — Discharge Instructions (Signed)
Radial Site Care °Refer to this sheet in the next few weeks. These instructions provide you with information on caring for yourself after your procedure. Your caregiver may also give you more specific instructions. Your treatment has been planned according to current medical practices, but problems sometimes occur. Call your caregiver if you have any problems or questions after your procedure. °HOME CARE INSTRUCTIONS °· You may shower the day after the procedure. Remove the bandage (dressing) and gently wash the site with plain soap and water. Gently pat the site dry. °· Do not apply powder or lotion to the site. °· Do not submerge the affected site in water for 3 to 5 days. °· Inspect the site at least twice daily. °· Do not flex or bend the affected arm for 24 hours. °· No lifting over 5 pounds (2.3 kg) for 5 days after your procedure. °· Do not drive home if you are discharged the same day of the procedure. Have someone else drive you. °· You may drive 24 hours after the procedure unless otherwise instructed by your caregiver. °· Do not operate machinery or power tools for 24 hours. °· A responsible adult should be with you for the first 24 hours after you arrive home. °What to expect: °· Any bruising will usually fade within 1 to 2 weeks. °· Blood that collects in the tissue (hematoma) may be painful to the touch. It should usually decrease in size and tenderness within 1 to 2 weeks. °SEEK IMMEDIATE MEDICAL CARE IF: °· You have unusual pain at the radial site. °· You have redness, warmth, swelling, or pain at the radial site. °· You have drainage (other than a small amount of blood on the dressing). °· You have chills. °· You have a fever or persistent symptoms for more than 72 hours. °· You have a fever and your symptoms suddenly get worse. °· Your arm becomes pale, cool, tingly, or numb. °· You have heavy bleeding from the site. Hold pressure on the site. °Document Released: 08/11/2010 Document Revised:  10/01/2011 Document Reviewed: 08/11/2010 °ExitCare® Patient Information ©2015 [employer]. This information is not intended to replace advice given to you by your health care provider. Make sure you discuss any questions you have with your health care provider. ° °

## 2014-02-08 NOTE — Interval H&P Note (Signed)
History and Physical Interval Note:  02/08/2014 12:07 PM  [name] [name]  has presented today for surgery, with the diagnosis of aortic stenosis  The various methods of treatment have been discussed with the patient and family. After consideration of risks, benefits and other options for treatment, the patient has consented to  Procedure(s): LEFT AND RIGHT HEART CATHETERIZATION WITH CORONARY ANGIOGRAM (N/A) as a surgical intervention .  The patient's history has been reviewed, patient examined, no change in status, stable for surgery.  I have reviewed the patient's chart and labs.  Questions were answered to the patient's satisfaction.     Sherren Mocha

## 2014-02-08 NOTE — CV Procedure (Signed)
    Cardiac Catheterization Procedure Note  Name: [name] [name] MRN: [id number] DOB: [birth year]  Procedure: Right Heart Cath, Left Heart Cath, Selective Coronary Angiography, aortic root angiography  Indication: CHF, aortic insufficiency   Procedural Details: The right wrist and antecubital fossa were prepped, draped, and anesthetized with 1% lidocaine. Using the modified Seldinger technique a 5/6 French sheath was placed in the right radial artery and a 5 French sheath was placed in the right antecubital vein. A Swan-Ganz catheter was used for the right heart catheterization. Standard protocol was followed for recording of right heart pressures and sampling of oxygen saturations. Fick cardiac output was calculated. Standard Judkins catheters were used for selective coronary angiography and aortic root angiography. LV pressure was recorded with the JR4 catheter. There were no immediate procedural complications. The patient was transferred to the post catheterization recovery area for further monitoring.  Procedural Findings: Hemodynamics RA 10 RV 45/12 PA 48/23 mean 34 PCWP 18 LV 159/19 AO 130/51  Oxygen saturations: PA 63 AO 93  Cardiac Output (Fick) 4.3  Cardiac Index (Fick) 2.3   Coronary angiography: Coronary dominance: right  Left mainstem: Widely patent without obstructive disease  Left anterior descending (LAD): Large vessel. Widely patent without obstructive disease, wraps around the LV apex. The first and second diagonals are small vessels with moderate ostial stenoses. The 3rd diagonal is larger and has no significant disease.  Left circumflex (LCx): The proximal LCx is patent. The mid-LCx has 40% eccentric plaque just after the first OM. The stented segment in the mid-circumflex is patent. The second OM is patent. There is no high-grade disease in the LCx distribution.  Right coronary artery (RCA): The RCA has 95% stenosis in the mid-portion of the vessel just after  the acute marginal branch. The PDA and PLA branches are very small.   Aortic root angiography: There is a bioprosthetic aortic valve present. The Ascending aorta appears non-dilated. There is severe AI.   Final Conclusions:   1. Nonobstructive CAD of the LM, LAD, and LCx with continued patency of the stented segment in the LCx.  2. Chronic severe stenosis of the RCA (small vessel) 3. Severe AI  Recommendations: Continued evaluation in the Multidisciplinary Valve Clinic. Will review findings with Dr Cyndia Bent.  Sherren Mocha 02/08/2014, 1:00 PM

## 2014-02-08 NOTE — H&P (View-Only) (Signed)
HPI:  80 year old gentleman presenting for followup evaluation. The patient has a history of fairly complex cardiovascular disease. He has undergone AAA repair, aortic valve replacement, and is known to have coronary artery disease with a chronically occluded right coronary artery and previous stenting of the left circumflex. I initially saw the patient in January when he presented with congestive heart failure. He was found to have significant aortic insufficiency and a TEE was performed. This demonstrated primarily central AI from his bioprosthetic aortic valve. There is also mild perivalvular leak noted. The patient initially responded well to medical therapy with oral diuretics. The patient was seen by Dr. Cyndia Bent in February and because he was doing well at that time ongoing observation was recommended.  He continues to be under a lot of stress at home. His wife has advanced dementia. The patient has been having more problems with orthopnea. He sleeps in a chair most of the time so that he can keep his head elevated. He also complains of exertional dyspnea with low-level activities. He has not had chest pain or pressure. He denies lightheadedness or syncope. He's had only mild leg swelling, but not to the degree it has been in the past.  Outpatient Encounter Prescriptions as of 02/04/2014  Medication Sig  . aspirin 81 MG tablet Take 81 mg by mouth daily.  Marland Kitchen atorvastatin (LIPITOR) 80 MG tablet Take 1 tablet (80 mg total) by mouth daily.  . B Complex-C (SUPER B COMPLEX) TABS Take 1 tablet by mouth daily.  Marland Kitchen ezetimibe (ZETIA) 10 MG tablet Take 1 tablet (10 mg total) by mouth daily.  . finasteride (PROSCAR) 5 MG tablet Take 1 tablet (5 mg total) by mouth daily.  . furosemide (LASIX) 40 MG tablet Take 0.5 tablets (20 mg total) by mouth daily. Take extra half tablet ( 20 mg) for weight gain up to 2-3 pounds over night  . levothyroxine (SYNTHROID, LEVOTHROID) 50 MCG tablet Take 1 tablet (50 mcg total)  by mouth daily.  Marland Kitchen losartan (COZAAR) 50 MG tablet Take 1 tablet (50 mg total) by mouth daily.  Marland Kitchen PARoxetine (PAXIL) 20 MG tablet Take 0.5 tablets (10 mg total) by mouth every morning.  . potassium chloride SA (K-DUR,KLOR-CON) 10 MEQ tablet Take 1 pill every time you take Lasix  . [DISCONTINUED] Multiple Vitamin (MULTI VITAMIN DAILY PO) Take by mouth.    Allergies  Allergen Reactions  . Amoxicillin-Pot Clavulanate Itching  . Morphine And Related Nausea And Vomiting  . Penicillins Hives    Past Medical History  Diagnosis Date  . CVA (cerebral infarction) 12/09  . CAD (coronary artery disease)     stents in [place] and has a totally occluded RCA  . HTN (hypertension)   . Right iliac artery stenosis   . BPH (benign prostatic hyperplasia)   . Aortic stenosis     s/p AVR in 2003, bovine  . Diverticulosis   . AAA (abdominal aortic aneurysm)     s/p endovascular repair 2004  . Hyperlipemia   . Thyroid disease   . Glaucoma     recently told by Dr Janyth Contes he did not have it  . BCC (basal cell carcinoma) 09/17/2012  . Personal history of poliomyelitis 09/17/2012    In childhood  . Sleeping difficulties   . Leaky heart valve   . Chicken pox   . Polio   . Mumps   . Measles     ROS: Negative except as per HPI  BP 142/54  Pulse  72  Ht 5' 5.5" (1.664 m)  Wt 183 lb 3.2 oz (83.099 kg)  BMI 30.01 kg/m2  PHYSICAL EXAM: Pt is alert and oriented, pleasant elderly male in NAD HEENT: normal Neck: JVP - normal Lungs: CTA bilaterally CV: RRR with grade 3/6 diastolic decrescendo murmur at the LLSB Abd: soft, NT, Positive BS, no hepatomegaly Ext: no C/C/E, distal pulses intact and equal Skin: warm/dry no rash  2D Echo: Study Conclusions  - Left ventricle: The cavity size was normal. There was mild concentric hypertrophy. Systolic function was normal. The estimated ejection fraction was in the range of 50% to 55%. Wall motion was normal; there were no regional wall  motion abnormalities. Doppler parameters are consistent with abnormal left ventricular relaxation (grade 1 diastolic dysfunction). - Aortic valve: A bioprosthetic valve sits well in the aortic position, there is no abnormal rocking motion. Leaflets open well. The transaortic gradients are 27 and 45 mmHg and are elevated for this type of prosthesis. A moderate central aortic insufficiency with eccentric anteriorly directed jet is present. There a very small jets of paravalvular leak present. There was moderate regurgitation. - Aortic root: The aortic root was normal in size. - Mitral valve: There was mild regurgitation. - Left atrium: The atrium was moderately dilated. - Tricuspid valve: Poorly visualized. There was no regurgitation. - Pulmonic valve: There was mild regurgitation.  Impressions:  - No significant chabges when compared to the study from 09/06/2013. The transaortic gradients are 27 and 45 mmHg and are elevated for this type of prosthesis however they are improved when compared to the prior study. Stable moderate central aortic insufficiency with minimal paravalvular leak.  ASSESSMENT AND PLAN: 1. Acute on chronic diastolic/valvular congestive heart failure, New York Heart Association class III  2. Aortic valve disease status post bioprosthetic AVR 2003(21 mm Edwards Perimount Valve)  3. Coronary atherosclerosis, native vessel. Previous left circumflex stenting and chronically occluded small RCA not suitable for grafting at the time of heart surgery in 2003  4. Chronic kidney disease, stage III  The patient continues to have symptomatic heart failure with orthopnea and exertional dyspnea despite medical therapy. He is here with his daughter today and we had a lengthy discussion about treatment options. I think he may ultimately need treatment for his aortic insufficiency and valve and valve TAVR might be a reasonable treatment option. Recommended a right and left heart  catheterization to assess his hemodynamics and coronary anatomy. I reviewed the risks, indications, and alternatives with the patient understands and agrees to proceed. Risks include but are not limited to vascular injury, bleeding, stroke, myocardial infarction, and death. He understands these risks recurrent low-frequency (less than 1%) and agrees to proceed. Plan on radial approach considering his history of EVAR.  Sherren Mocha 02/06/2014 7:40 AM

## 2014-02-09 ENCOUNTER — Other Ambulatory Visit: Payer: Self-pay | Admitting: *Deleted

## 2014-02-09 DIAGNOSIS — I2510 Atherosclerotic heart disease of native coronary artery without angina pectoris: Secondary | ICD-10-CM

## 2014-02-09 DIAGNOSIS — I359 Nonrheumatic aortic valve disorder, unspecified: Secondary | ICD-10-CM

## 2014-02-09 DIAGNOSIS — N289 Disorder of kidney and ureter, unspecified: Secondary | ICD-10-CM

## 2014-02-17 ENCOUNTER — Ambulatory Visit (HOSPITAL_COMMUNITY): Payer: Medicare Other

## 2014-02-18 ENCOUNTER — Ambulatory Visit (HOSPITAL_COMMUNITY): Payer: Medicare Other

## 2014-02-18 ENCOUNTER — Inpatient Hospital Stay (HOSPITAL_COMMUNITY)
Admission: RE | Admit: 2014-02-18 | Discharge: 2014-02-18 | Disposition: A | Discharge status: Home / Self Care | Payer: Medicare Other | Source: Ambulatory Visit | Attending: Cardiovascular Disease | Admitting: Cardiovascular Disease

## 2014-02-18 ENCOUNTER — Ambulatory Visit (HOSPITAL_COMMUNITY)
Admission: RE | Admit: 2014-02-18 | Discharge: 2014-02-18 | Disposition: A | Discharge status: Home / Self Care | Payer: Medicare Other | Source: Ambulatory Visit | Attending: Cardiovascular Disease | Admitting: Cardiovascular Disease

## 2014-02-18 DIAGNOSIS — I359 Nonrheumatic aortic valve disorder, unspecified: Secondary | ICD-10-CM | POA: Insufficient documentation

## 2014-02-18 DIAGNOSIS — N400 Enlarged prostate without lower urinary tract symptoms: Secondary | ICD-10-CM | POA: Insufficient documentation

## 2014-02-18 DIAGNOSIS — N289 Disorder of kidney and ureter, unspecified: Secondary | ICD-10-CM | POA: Diagnosis not present

## 2014-02-18 DIAGNOSIS — I2510 Atherosclerotic heart disease of native coronary artery without angina pectoris: Secondary | ICD-10-CM | POA: Diagnosis not present

## 2014-02-18 DIAGNOSIS — K5730 Diverticulosis of large intestine without perforation or abscess without bleeding: Secondary | ICD-10-CM | POA: Insufficient documentation

## 2014-02-18 DIAGNOSIS — R0609 Other forms of dyspnea: Secondary | ICD-10-CM | POA: Insufficient documentation

## 2014-02-18 DIAGNOSIS — R0989 Other specified symptoms and signs involving the circulatory and respiratory systems: Secondary | ICD-10-CM | POA: Insufficient documentation

## 2014-02-18 DIAGNOSIS — Z01810 Encounter for preprocedural cardiovascular examination: Secondary | ICD-10-CM | POA: Diagnosis not present

## 2014-02-18 LAB — PULMONARY FUNCTION TEST
DL/VA % PRED: 60 %
DL/VA: 2.55 ml/min/mmHg/L
DLCO COR % PRED: 44 %
DLCO UNC: 11.47 ml/min/mmHg
DLCO cor: 11.47 ml/min/mmHg
DLCO unc % pred: 44 %
FEF 25-75 POST: 1.29 L/s
FEF 25-75 Pre: 0.82 L/sec
FEF2575-%Change-Post: 57 %
FEF2575-%PRED-POST: 117 %
FEF2575-%PRED-PRE: 74 %
FEV1-%CHANGE-POST: 11 %
FEV1-%PRED-PRE: 91 %
FEV1-%Pred-Post: 101 %
FEV1-Post: 1.92 L
FEV1-Pre: 1.72 L
FEV1FVC-%Change-Post: 4 %
FEV1FVC-%PRED-PRE: 89 %
FEV6-%CHANGE-POST: 7 %
FEV6-%PRED-POST: 110 %
FEV6-%PRED-PRE: 103 %
FEV6-POST: 2.85 L
FEV6-PRE: 2.65 L
FEV6FVC-%CHANGE-POST: 3 %
FEV6FVC-%Pred-Post: 108 %
FEV6FVC-%Pred-Pre: 105 %
FVC-%Change-Post: 6 %
FVC-%PRED-PRE: 97 %
FVC-%Pred-Post: 103 %
FVC-POST: 2.93 L
FVC-PRE: 2.75 L
PRE FEV1/FVC RATIO: 63 %
PRE FEV6/FVC RATIO: 96 %
Post FEV1/FVC ratio: 66 %
Post FEV6/FVC ratio: 99 %
RV % PRED: 63 %
RV: 1.62 L
TLC % pred: 75 %
TLC: 4.58 L

## 2014-02-18 MED ORDER — ALBUTEROL SULFATE (2.5 MG/3ML) 0.083% IN NEBU
2.5000 mg | INHALATION_SOLUTION | Freq: Once | RESPIRATORY_TRACT | Status: AC
  Administered 2014-02-18: 2.5 mg via RESPIRATORY_TRACT

## 2014-02-18 MED ORDER — IOHEXOL 350 MG/ML SOLN
100.0000 mL | Freq: Once | INTRAVENOUS | Status: AC | PRN
  Administered 2014-02-18: 100 mL via INTRAVENOUS

## 2014-02-19 ENCOUNTER — Ambulatory Visit: Payer: Medicare Other | Attending: Surgery | Admitting: Physical Therapy

## 2014-02-19 DIAGNOSIS — I359 Nonrheumatic aortic valve disorder, unspecified: Secondary | ICD-10-CM | POA: Insufficient documentation

## 2014-02-19 DIAGNOSIS — R5383 Other fatigue: Secondary | ICD-10-CM

## 2014-02-19 DIAGNOSIS — IMO0001 Reserved for inherently not codable concepts without codable children: Secondary | ICD-10-CM | POA: Diagnosis not present

## 2014-02-19 DIAGNOSIS — R5381 Other malaise: Secondary | ICD-10-CM | POA: Diagnosis not present

## 2014-02-24 ENCOUNTER — Encounter: Payer: Self-pay | Admitting: Surgery

## 2014-02-24 ENCOUNTER — Ambulatory Visit (INDEPENDENT_AMBULATORY_CARE_PROVIDER_SITE_OTHER): Payer: Medicare Other | Admitting: Surgery

## 2014-02-24 VITALS — BP 126/51 | HR 74 | Ht 65.0 in | Wt 182.0 lb

## 2014-02-24 DIAGNOSIS — I350 Nonrheumatic aortic (valve) stenosis: Secondary | ICD-10-CM

## 2014-02-24 DIAGNOSIS — I359 Nonrheumatic aortic valve disorder, unspecified: Secondary | ICD-10-CM

## 2014-02-25 ENCOUNTER — Encounter: Payer: Self-pay | Admitting: Surgery

## 2014-02-25 ENCOUNTER — Other Ambulatory Visit: Payer: Self-pay

## 2014-02-25 DIAGNOSIS — I059 Rheumatic mitral valve disease, unspecified: Secondary | ICD-10-CM

## 2014-02-25 NOTE — Progress Notes (Signed)
HPI:  The patient is an 80 year old gentleman with a history of hypertension, hyperlipidemia, diffuse vascular disease s/p EVAR 2004, prior stroke with recovery, and coronary disease. 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 He subsequently had a stent place in the LCS in 1997. He underwent AVR by me in July, 2003 for severe AS using an Edwards Perimount pericardial valve. His LCX stent was patent at that time and the RCA was occluded. He had an echo in October 2014 which showed moderate central AI through the prosthetic valve and some perivalvular leak. Since he felt well and had normal LV function continued followup was planned. I saw in on 09/03/2013 and he was reporting a couple month history of progressive shortness of breath and at the end of January he developed orthopnea and PND. He presented to Dr. Burt Knack on 08/21/2013 after he was unable to sleep due to orthopnea. He noted swelling in his legs but no chest pain or pressure. He was treated with diuresis with quick resolution of his leg edema and shortness of breath. When I saw him in February he said he felt well. He underwent TEE in Feb showing moderate central AI through the prosthetic valve with 2 very small jets of paravalvular leak and an EF of 55-60%. He was felt to be very high risk for conventional redo AVR and possible CABG and since he was doing well in Feb we decided to continue following him with a plan for possible valve in valve TAVR if he worsened.  He continues to be under a lot of stress at home, taking care of his wife who has advanced dementia. The patient has been having more problems with orthopnea. He sleeps in a chair most of the time so that he can keep his head elevated. He also complains of exertional dyspnea with low-level activities. He has not had chest pain or pressure. He denies lightheadedness or syncope. He's had only mild leg swelling, but not to the degree it has been in the  past.  He underwent cath on 02/08/2014 as part of his workup for TAVR. This showed nonobstructive CAD with continued patency of the stented segment of the LCX. There was chronic severe stenosis of the small RCA. There was clearly severe AI on root injection.  Current Outpatient Prescriptions  Medication Sig Dispense Refill  . aspirin 81 MG tablet Take 81 mg by mouth daily.      Marland Kitchen atorvastatin (LIPITOR) 80 MG tablet Take 1 tablet (80 mg total) by mouth daily.  90 tablet  3  . B Complex-C (SUPER B COMPLEX) TABS Take 1 tablet by mouth daily.      Marland Kitchen ezetimibe (ZETIA) 10 MG tablet Take 1 tablet (10 mg total) by mouth daily.  90 tablet  3  . finasteride (PROSCAR) 5 MG tablet Take 1 tablet (5 mg total) by mouth daily.  90 tablet  3  . furosemide (LASIX) 40 MG tablet Take 0.5 tablets (20 mg total) by mouth daily. Take extra half tablet ( 20 mg) for weight gain up to 2-3 pounds over night  30 tablet  3  . levothyroxine (SYNTHROID, LEVOTHROID) 50 MCG tablet Take 1 tablet (50 mcg total) by mouth daily.  90 tablet  3  . losartan (COZAAR) 50 MG tablet Take 1 tablet (50 mg total) by mouth daily.  90 tablet  3  . PARoxetine (PAXIL) 20 MG tablet Take 0.5 tablets (10 mg total) by  mouth every morning.  45 tablet  3  . potassium chloride SA (K-DUR,KLOR-CON) 10 MEQ tablet Take 1 pill every time you take Lasix  30 tablet  6   No current facility-administered medications for this visit.     Physical Exam: BP 126/51  Pulse 74  Ht 5\' 5"  (1.651 m)  Wt 182 lb (82.555 kg)  BMI 30.29 kg/m2  SpO2 94% Alert and oriented Lungs clear Heart: RRR, 2/6 systolic murmur over aorta, 3/6 diastolic decrescendo murmur at the LLSB radiating to the apex. There is no peripheral edema  Diagnostic Tests:  Cardiac Catheterization Procedure Note  Name: ADELFO DIEBEL  MRN: [id number]  DOB: 02-Aug-1922  Procedure: Right Heart Cath, Left Heart Cath, Selective Coronary Angiography, aortic root angiography  Indication: CHF, aortic  insufficiency  Procedural Details: The right wrist and antecubital fossa were prepped, draped, and anesthetized with 1% lidocaine. Using the modified Seldinger technique a 5/6 French sheath was placed in the right radial artery and a 5 French sheath was placed in the right antecubital vein. A Swan-Ganz catheter was used for the right heart catheterization. Standard protocol was followed for recording of right heart pressures and sampling of oxygen saturations. Fick cardiac output was calculated. Standard Judkins catheters were used for selective coronary angiography and aortic root angiography. LV pressure was recorded with the JR4 catheter. There were no immediate procedural complications. The patient was transferred to the post catheterization recovery area for further monitoring.  Procedural Findings:  Hemodynamics  RA 10  RV 45/12  PA 48/23 mean 34  PCWP 18  LV 159/19  AO 130/51  Oxygen saturations:  PA 63  AO 93  Cardiac Output (Fick) 4.3  Cardiac Index (Fick) 2.3  Coronary angiography:  Coronary dominance: right  Left mainstem: Widely patent without obstructive disease  Left anterior descending (LAD): Large vessel. Widely patent without obstructive disease, wraps around the LV apex. The first and second diagonals are small vessels with moderate ostial stenoses. The 3rd diagonal is larger and has no significant disease.  Left circumflex (LCx): The proximal LCx is patent. The mid-LCx has 40% eccentric plaque just after the first OM. The stented segment in the mid-circumflex is patent. The second OM is patent. There is no high-grade disease in the LCx distribution.  Right coronary artery (RCA): The RCA has 95% stenosis in the mid-portion of the vessel just after the acute marginal branch. The PDA and PLA branches are very small.  Aortic root angiography: There is a bioprosthetic aortic valve present. The Ascending aorta appears non-dilated. There is severe AI.  Final Conclusions:  1.  Nonobstructive CAD of the LM, LAD, and LCx with continued patency of the stented segment in the LCx.  2. Chronic severe stenosis of the RCA (small vessel)  3. Severe AI  Recommendations: Continued evaluation in the Multidisciplinary Valve Clinic. Will review findings with Dr Cyndia Bent.  Sherren Mocha  02/08/2014, 1:00 PM    *[facility] [employer]. [place],  [address] [phone]  ------------------------------------------------------------------- Transthoracic Echocardiography  Patient: Karlo, Goeden MR #: [id number] Study Date: 01/05/2014 Gender: M Age: 89 Height: 166.4 cm Weight: 81.8 kg BSA: 1.97 m^2 Pt. Status: Room:  SONOGRAPHER Diamond Nickel ORDERING Gerhardt, Walker, Outpatient  cc:  ------------------------------------------------------------------- LV EF: 50% - 55%  ------------------------------------------------------------------- Indications: Dyspnea 786.09.  ------------------------------------------------------------------- History: PMH: Aortic valve replacement. Coronary artery disease. Congestive heart failure. Risk factors: Hypertension.  ------------------------------------------------------------------- Study Conclusions  -  Left ventricle: The cavity size was normal. There was mild concentric hypertrophy. Systolic function was normal. The estimated ejection fraction was in the range of 50% to 55%. Wall motion was normal; there were no regional wall motion abnormalities. Doppler parameters are consistent with abnormal left ventricular relaxation (grade 1 diastolic dysfunction). - Aortic valve: A bioprosthetic valve sits well in the aortic position, there is no abnormal rocking motion. Leaflets open well. The transaortic gradients are 27 and 45 mmHg and are elevated for this type of prosthesis. A moderate central aortic insufficiency with eccentric  anteriorly directed jet is present. There a very small jets of paravalvular leak present. There was moderate regurgitation. - Aortic root: The aortic root was normal in size. - Mitral valve: There was mild regurgitation. - Left atrium: The atrium was moderately dilated. - Tricuspid valve: Poorly visualized. There was no regurgitation. - Pulmonic valve: There was mild regurgitation.  Impressions:  - No significant chabges when compared to the study from 09/06/2013. The transaortic gradients are 27 and 45 mmHg and are elevated for this type of prosthesis however they are improved when compared to the prior study. Stable moderate central aortic insufficiency with minimal paravalvular leak.  Transthoracic echocardiography. M-mode, complete 2D, spectral Doppler, and color Doppler. Birthdate: Patient birthdate: 11-Jan-1923. Age: Patient is 81 yr old. Sex: Gender: male. Height: Height: 166.4 cm. Height: 65.5 in. Weight: Weight: 81.8 kg. Weight: 180 lb. Body mass index: BMI: 29.6 kg/m^2. Body surface area: BSA: 1.97 m^2. Blood pressure: 144/51 Patient status: Outpatient. Study date: Study date: 01/05/2014. Study time: 10:17 AM. Location: Echo laboratory.  -------------------------------------------------------------------  ------------------------------------------------------------------- Left ventricle: The cavity size was normal. There was mild concentric hypertrophy. Systolic function was normal. The estimated ejection fraction was in the range of 50% to 55%. Wall motion was normal; there were no regional wall motion abnormalities. Doppler parameters are consistent with abnormal left ventricular relaxation (grade 1 diastolic dysfunction). There was no evidence of elevated ventricular filling pressure by Doppler parameters.  ------------------------------------------------------------------- Aortic valve: A bioprosthetic valve sits well in the aortic position, there is no abnormal  rocking motion. Leaflets open well. The transaortic gradients are 27 and 45 mmHg and are elevated for this type of prosthesis. A moderate central aortic insufficiency with eccentric anteriorly directed jet is present. There a very small jets of paravalvular leak present. Doppler: There was moderate regurgitation. Mean velocity ratio of LVOT to aortic valve: 0.2.  ------------------------------------------------------------------- Aorta: Aortic root: The aortic root was normal in size.  ------------------------------------------------------------------- Mitral valve: Structurally normal valve. Mobility was not restricted. Doppler: Transvalvular velocity was within the normal range. There was no evidence for stenosis. There was mild regurgitation.  ------------------------------------------------------------------- Left atrium: The atrium was moderately dilated.  ------------------------------------------------------------------- Right ventricle: Poorly visualized. The cavity size was normal. Wall thickness was normal. Systolic function was normal.  ------------------------------------------------------------------- Pulmonic valve: Structurally normal valve. Cusp separation was normal. Doppler: Transvalvular velocity was within the normal range. There was no evidence for stenosis. There was mild regurgitation.  ------------------------------------------------------------------- Tricuspid valve: Poorly visualized. Structurally normal valve. Doppler: Transvalvular velocity was within the normal range. There was no regurgitation.  ------------------------------------------------------------------- Pulmonary artery: The main pulmonary artery was normal-sized. Systolic pressure was within the normal range.  ------------------------------------------------------------------- Right atrium: The atrium was normal in  size.  ------------------------------------------------------------------- Pericardium: There was no pericardial effusion.  ------------------------------------------------------------------- Systemic veins: Inferior vena cava: The vessel was normal in size.  ------------------------------------------------------------------- Prepared and Electronically Authenticated by  Ena Dawley, M.D. 2015-06-16T14:01:27  ------------------------------------------------------------------- Measurements  Left  ventricle Value Reference LV ID, ED, PLAX chordal (H) 52.8 mm 43 - 52 LV ID, ES, PLAX chordal (H) 43.5 mm 23 - 38 LV fx shortening, PLAX chordal (L) 18 % >=29 LV PW thickness, ED 12.3 mm --------- IVS/LV PW ratio, ED (N) 1.18 <=1.3 LV e&', lateral 7.29 cm/s --------- LV E/e&', lateral 7.12 --------- LV e&', medial 4.13 cm/s --------- LV E/e&', medial 12.57 --------- LV e&', average 5.71 cm/s --------- LV E/e&', average 9.09 ---------  Ventricular septum Value Reference IVS thickness, ED 14.5 mm ---------  LVOT Value Reference LVOT mean velocity, S 44.1 cm/s ---------  Aortic valve Value Reference Aortic valve peak velocity, S 336 cm/s --------- Aortic valve mean velocity, S 222 cm/s --------- Aortic valve VTI, S 82.5 cm --------- Velocity ratio, mean, LVOT/AV 0.2 --------- Aortic regurg peak velocity 222 cm/s --------- Aortic regurg pressure half-time 236 ms --------- Aortic regurg peak gradient 20 mm Hg ---------  Left atrium Value Reference LA ID, A-P, ES 45 mm --------- LA ID/bsa, A-P (H) 2.29 cm/m^2 <=2.2  Mitral valve Value Reference Mitral E-wave peak velocity 51.9 cm/s --------- Mitral A-wave peak velocity 86.8 cm/s --------- Mitral deceleration time (H) 366 ms 150 - 230 Mitral E/A ratio, peak 0.6 ---------  Right ventricle Value Reference RV s&', lateral, S 8.38 cm/s ---------  Legend: (L) and (H) mark values outside specified reference range.  (N)  marks values inside specified reference range.    Impression:  He has severe AI from a degenerated 21 mm Edwards Perimount valve with NYHA class III symptoms from acute on chronic diastolic congestive heart failure. His symptoms have been worsening over the last 6 months and are very limiting at this point. His operative risk for conventional redo AVR +/- CABG would be high with an estimated 30 day mortality of greater than 15% given his advanced age, redo surgery, and stage III CKD. He would also have a very difficult and prolonged recovery from open surgery. I think valve in valve TAVR with a 23 mm Sapien XT valve would be a reasonable alternative for this patient. The Sapient XT has [place] approval for this indication. We will need to discuss the best route to insert the valve. He has adequate access via the left femoral artery but has an abdominal aortic stent graft extending down into the iliacs. The other option would be transapical. I had a long discussion with the patient and his son concerning the treatment options and risks. All of their questions have been answered to their satisfaction and they would like to proceed with surgery.   Plan:  He will return to see Dr. Roxy Manns for a second cardiac surgery consultation and surgery has tentatively be scheduled for Sept 1 pending further evaluation.

## 2014-03-01 ENCOUNTER — Telehealth: Payer: Self-pay | Admitting: Nurse Practitioner

## 2014-03-01 NOTE — Telephone Encounter (Signed)
° °  Pt would like Danielle to give him a call back. He wouldn't give me any information. Please call back.

## 2014-03-02 ENCOUNTER — Other Ambulatory Visit: Payer: Self-pay | Admitting: *Deleted

## 2014-03-02 MED ORDER — FUROSEMIDE 40 MG PO TABS: 20.0000 mg | ORAL_TABLET | Freq: Every day | ORAL | Status: DC

## 2014-03-02 NOTE — Telephone Encounter (Signed)
S/w pt wanted [name] [name] to change test for AAA duplex explained to pt we did not order that test has to to call [facility]. Also stated wanted lasix filled sent that into [facility] in [place]

## 2014-03-02 NOTE — Telephone Encounter (Signed)
Left message on machine for pt to contact the office.   

## 2014-03-05 ENCOUNTER — Telehealth: Payer: Self-pay | Admitting: Family

## 2014-03-05 ENCOUNTER — Telehealth: Payer: Self-pay | Admitting: Surgery

## 2014-03-05 NOTE — Telephone Encounter (Signed)
Message copied by Rufina Falco on Fri Mar 05, 2014  9:36 AM ------      Message from: Viann Fish      Created: Thu Mar 04, 2014  5:49 PM      Regarding: pt wants to reschedule       I received a message that pt wants to speak with me.      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 Laurene Footman office that he should postpone the EVAR Duplex from 04/12/14 to later in October.      Please call him about the second or third week in September to reschedule this for the second week in October or later.      Also reschedule with me for that date.            Thank you,      Vinnie Level ------

## 2014-03-05 NOTE — Telephone Encounter (Signed)
[name] [name] spoke with patient to cancel- she will call him back 2nd week in Sept to r/s as per her cancellation notes, dpm

## 2014-03-05 NOTE — Telephone Encounter (Signed)
Message copied by Gena Fray on Fri Mar 05, 2014 11:43 AM ------      Message from: Viann Fish      Created: Thu Mar 04, 2014  5:49 PM      Regarding: pt wants to reschedule       I received a message that pt wants to speak with me.      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 Laurene Footman office that he should postpone the EVAR Duplex from 04/12/14 to later in October.      Please call him about the second or third week in September to reschedule this for the second week in October or later.      Also reschedule with me for that date.            Thank you,      Vinnie Level ------

## 2014-03-05 NOTE — Telephone Encounter (Signed)
Spoke with [name].  He is aware that I've cancelled his appointment at [facility] on 04/12/14 and that I'll call him early September to reschedule for late October.

## 2014-03-09 ENCOUNTER — Institutional Professional Consult (permissible substitution) (INDEPENDENT_AMBULATORY_CARE_PROVIDER_SITE_OTHER): Payer: Medicare Other | Admitting: Thoracic Surgery (Cardiothoracic Vascular Surgery)

## 2014-03-09 ENCOUNTER — Encounter (HOSPITAL_COMMUNITY): Payer: Self-pay | Admitting: *Deleted

## 2014-03-09 ENCOUNTER — Encounter (HOSPITAL_COMMUNITY): Payer: Self-pay | Admitting: Pharmacy Technician

## 2014-03-09 ENCOUNTER — Encounter: Payer: Self-pay | Admitting: Thoracic Surgery (Cardiothoracic Vascular Surgery)

## 2014-03-09 VITALS — BP 124/45 | HR 66 | Resp 20 | Ht 65.0 in | Wt 182.0 lb

## 2014-03-09 DIAGNOSIS — I359 Nonrheumatic aortic valve disorder, unspecified: Secondary | ICD-10-CM

## 2014-03-09 DIAGNOSIS — I97190 Other postprocedural cardiac functional disturbances following cardiac surgery: Secondary | ICD-10-CM

## 2014-03-09 DIAGNOSIS — T8209XA Other mechanical complication of heart valve prosthesis, initial encounter: Secondary | ICD-10-CM | POA: Insufficient documentation

## 2014-03-09 DIAGNOSIS — Z954 Presence of other heart-valve replacement: Secondary | ICD-10-CM

## 2014-03-09 DIAGNOSIS — Z952 Presence of prosthetic heart valve: Secondary | ICD-10-CM

## 2014-03-09 DIAGNOSIS — I2510 Atherosclerotic heart disease of native coronary artery without angina pectoris: Secondary | ICD-10-CM

## 2014-03-09 DIAGNOSIS — I350 Nonrheumatic aortic (valve) stenosis: Secondary | ICD-10-CM | POA: Insufficient documentation

## 2014-03-09 DIAGNOSIS — I351 Nonrheumatic aortic (valve) insufficiency: Secondary | ICD-10-CM

## 2014-03-09 NOTE — Progress Notes (Signed)
HEART AND [place] [facility] CONSULTATION REPORT  Referring Provider is Sherren Mocha, MD PCP is Hassell Done Sandria Manly, PA-C  Chief Complaint  Patient presents with  . Aortic Stenosis    second opinion regarding treatment of prosthetic valve disease    HPI:  Patient is an 80 year old retired white male from [place] with long-standing history of aortic valve disease, coronary artery disease, hypertension, hyperlipidemia, cerebrovascular disease with previous stroke, and abdominal aortic aneurysm whose cardiac history dates back to 1997 when he suffered an inferior wall myocardial infarction. He was treated with angioplasty and stent placement in the left circumflex coronary artery. He was noted to have small chronically occluded right coronary artery as well. Several years later he underwent aortic valve replacement by Dr. Cyndia Bent in 2003 using an [place] bovine bioprosthetic tissue valve for severe symptomatic aortic stenosis.  At that time the stent in the left circumflex coronary artery remained widely patent and there has not been significant progression of coronary artery disease. The patient recovered from the surgery uneventfully and has done well from a cardiovascular standpoint until more recently. Followup echocardiogram performed in October 2014 demonstrated the presence of moderate central aortic insufficiency related to prosthetic valve dysfunction. Left ventricular function was normal and the patient was followed clinically. In January the patient presented with worsening symptoms of congestive heart failure. He was treated medically with some degree of success, but followup transesophageal echocardiogram performed in February confirmed the presence of significant prosthetic valve dysfunction with at least moderate central aortic insufficiency as well as 2 small jets what was felt to be perivalvular leak.  The  patient was referred to Dr. Cyndia Bent in February to consider redo aortic valve replacement. Because of the patient's advanced age and numerous comorbid medical problems, he was felt to be relatively high-risk and medical therapy was recommended.  Initially the patient did reasonably well, but over the past six months he has experienced continued progression of symptoms of chronic diastolic congestive heart failure. He was seen in followup by Dr. Burt Knack in July after having repeat transthoracic echocardiogram performed in June.  Echocardiogram revealed slight decrease in left ventricular systolic function with ejection fraction estimated 50-55%. Subsequently cardiac catheterization was performed.  This revealed stable nonobstructive coronary artery disease involving the left coronary system with continued patency of the stent in the left circumflex coronary artery and stable chronic high-grade stenosis of the small right coronary artery. The patient has been seen back in followup by Dr. Cyndia Bent, and after consultation with both Dr. Cyndia Bent and Dr. Burt Knack transcatheter aortic valve replacement has been contemplated as an alternative to high-risk conventional surgery. The patient has been referred for second surgical opinion to discuss treatment options further.  The patient is married and lives locally and [place] with his wife. His wife is elderly and suffers from severe dementia. The patient is his wife primary caregiver. He has remained completely functionally independent and reasonably active physically, although he has suffered considerably over the past 6-9 months with worsening symptoms of exertional shortness of breath. He now get short of breath with relatively mild activity and intermittently at rest. He has had problems with orthopnea and occasional PND. He has not had any symptoms of chest pain or chest tightness either with activity or at rest. He has not had any dizzy spells, nor syncope. He does not  complain of lower extremity edema. Caring for his wife has been quite stressful and increasingly  challenging.   Past Medical History  Diagnosis Date  . CVA (cerebral infarction) 12/09  . CAD (coronary artery disease)     stents in [place] and has a totally occluded RCA  . HTN (hypertension)   . Right iliac artery stenosis   . BPH (benign prostatic hyperplasia)   . Aortic stenosis     s/p AVR in 2003, bovine  . Diverticulosis   . AAA (abdominal aortic aneurysm)     s/p endovascular repair 2004  . Hyperlipemia   . Thyroid disease   . Glaucoma     recently told by Dr Janyth Contes he did not have it  . BCC (basal cell carcinoma) 09/17/2012  . Personal history of poliomyelitis 09/17/2012    In childhood  . Sleeping difficulties   . Leaky heart valve   . Chicken pox   . Polio   . Mumps   . Measles   . Aortic insufficiency     Prosthetic valve dysfunction  . Prosthetic valve dysfunction     Aortic stenosis and insufficiency     Past Surgical History  Procedure Laterality Date  . Aortic valve replacement  2003    #27mm pericardial valve   . Abdominal aortic aneurysm repair  2004    s/p endovascular repiar of AAA in 2004  . Coronary stent placement    . Tonsillectomy    . Wisdom tooth extraction    . Appendectomy    . Tee without cardioversion N/A 09/02/2013    Procedure: TRANSESOPHAGEAL ECHOCARDIOGRAM (TEE);  Surgeon: Dorothy Spark, MD;  Location: [facility] ENDOSCOPY;  Service: Cardiovascular;  Laterality: N/A;    Family History  Problem Relation Age of Onset  . Colon cancer Mother   . Aneurysm Father   . Heart attack Brother     CHF  . Prostate cancer Brother     History   Social History  . Marital Status: Married    Spouse Name: N/A    Number of Children: N/A  . Years of Education: N/A   Occupational History  . retired    Social History Main Topics  . Smoking status: Former Smoker -- 0.50 packs/day for 30 years    Types: Cigarettes    Quit date: 10/04/1980  .  Smokeless tobacco: Not on file  . Alcohol Use: 8.4 oz/week    14 Glasses of wine per week     Comment: cocktails/night  . Drug Use: No  . Sexual Activity: Not Currently   Other Topics Concern  . Not on file   Social History Narrative  . No narrative on file    Current Outpatient Prescriptions  Medication Sig Dispense Refill  . aspirin 81 MG tablet Take 81 mg by mouth daily.      Marland Kitchen atorvastatin (LIPITOR) 80 MG tablet Take 1 tablet (80 mg total) by mouth daily.  90 tablet  3  . B Complex-C (SUPER B COMPLEX) TABS Take 1 tablet by mouth daily.      Marland Kitchen ezetimibe (ZETIA) 10 MG tablet Take 1 tablet (10 mg total) by mouth daily.  90 tablet  3  . finasteride (PROSCAR) 5 MG tablet Take 1 tablet (5 mg total) by mouth daily.  90 tablet  3  . furosemide (LASIX) 40 MG tablet Take 0.5 tablets (20 mg total) by mouth daily. Take extra half tablet ( 20 mg) for weight gain up to 2-3 pounds over night  30 tablet  3  . levothyroxine (SYNTHROID, LEVOTHROID) 50 MCG tablet Take  1 tablet (50 mcg total) by mouth daily.  90 tablet  3  . losartan (COZAAR) 50 MG tablet Take 1 tablet (50 mg total) by mouth daily.  90 tablet  3  . PARoxetine (PAXIL) 20 MG tablet Take 0.5 tablets (10 mg total) by mouth every morning.  45 tablet  3  . potassium chloride (K-DUR,KLOR-CON) 10 MEQ tablet Take 5 mEq by mouth daily.       No current facility-administered medications for this visit.    Allergies  Allergen Reactions  . Amoxicillin-Pot Clavulanate Itching  . Morphine And Related Nausea And Vomiting  . Penicillins Hives      Review of Systems:   General:  normal appetite, decreased energy, no weight gain, no weight loss, no fever  Cardiac:  no chest pain with exertion, no chest pain at rest, + SOB with exertion, occasional resting SOB, + PND, + orthopnea, no palpitations, no arrhythmia, no atrial fibrillation, no LE edema, no dizzy spells, no syncope  Respiratory:  + shortness of breath, no home oxygen, no productive  cough, + chronic dry cough, no bronchitis, no wheezing, no hemoptysis, no asthma, no pain with inspiration or cough, no sleep apnea, no CPAP at night  GI:   no difficulty swallowing, no reflux, no frequent heartburn, no hiatal hernia, no abdominal pain, no constipation, no diarrhea, no hematochezia, no hematemesis, no melena  GU:   no dysuria,  no frequency, no urinary tract infection, no hematuria, + enlarged prostate, no kidney stones, no kidney disease  Vascular:  no pain suggestive of claudication, no pain in feet, + nocturnal leg cramps, + varicose veins, no DVT, no non-healing foot ulcer  Neuro:   + stroke, no TIA's, no seizures, no headaches, no temporary blindness one eye,  no slurred speech, no peripheral neuropathy, no chronic pain, no instability of gait, no memory/cognitive dysfunction  Musculoskeletal: mild arthritis, no joint swelling, no myalgias, no difficulty walking, normal mobility   Skin:   no rash, no itching, no skin infections, no pressure sores or ulcerations  Psych:   no anxiety, + situational depression, no nervousness, + unusual recent stress  Eyes:   no blurry vision, + floaters, no recent vision changes, + wears glasses or contacts  ENT:   + hearing loss, no loose or painful teeth, no dentures, last saw dentist within 6 months  Hematologic:  + easy bruising, no abnormal bleeding, no clotting disorder, no frequent epistaxis  Endocrine:  no diabetes, does not check CBG's at home           Physical Exam:   BP 124/45  Pulse 66  Resp 20  Ht 5\' 5"  (1.651 m)  Wt 182 lb (82.555 kg)  BMI 30.29 kg/m2  SpO2 95%  General:  Mildly obese but o/w  well-appearing  HEENT:  Unremarkable   Neck:   no JVD, no bruits, no adenopathy   Chest:   clear to auscultation, symmetrical breath sounds, no wheezes, no rhonchi   CV:   RRR, grade II/VI crescendo/decrescendo murmur heard best at RUSB, grade IV/VI diastolic murmur best LLSB  Abdomen:  soft, non-tender, no masses    Extremities:  warm, well-perfused, pulses palpable, no LE edema  Rectal/GU  Deferred  Neuro:   Grossly non-focal and symmetrical throughout  Skin:   Clean and dry, no rashes, no breakdown   Diagnostic Tests:  Transthoracic Echocardiography  Patient: [name] [name], [name] [name] MR #: [id number] Study Date: 01/05/2014 Gender: M Age: 4 Height: 166.4 cm Weight: 81.8  kg BSA: 1.97 m^2 Pt. Status: Room:  SONOGRAPHER Diamond Nickel ORDERING [place], [place], Outpatient  cc:  ------------------------------------------------------------------- LV EF: 50% - 55%  ------------------------------------------------------------------- Indications: Dyspnea 786.09.  ------------------------------------------------------------------- History: PMH: Aortic valve replacement. Coronary artery disease. Congestive heart failure. Risk factors: Hypertension.  ------------------------------------------------------------------- Study Conclusions  - Left ventricle: The cavity size was normal. There was mild concentric hypertrophy. Systolic function was normal. The estimated ejection fraction was in the range of 50% to 55%. Wall motion was normal; there were no regional wall motion abnormalities. Doppler parameters are consistent with abnormal left ventricular relaxation (grade 1 diastolic dysfunction). - Aortic valve: A bioprosthetic valve sits well in the aortic position, there is no abnormal rocking motion. Leaflets open well. The transaortic gradients are 27 and 45 mmHg and are elevated for this type of prosthesis. A moderate central aortic insufficiency with eccentric anteriorly directed jet is present. There a very small jets of paravalvular leak present. There was moderate regurgitation. - Aortic root: The aortic root was normal in size. - Mitral valve: There was mild regurgitation. - Left atrium: The atrium was moderately dilated. - Tricuspid  valve: Poorly visualized. There was no regurgitation. - Pulmonic valve: There was mild regurgitation.  Impressions:  - No significant chabges when compared to the study from 09/06/2013. The transaortic gradients are 27 and 45 mmHg and are elevated for this type of prosthesis however they are improved when compared to the prior study. Stable moderate central aortic insufficiency with minimal paravalvular leak.  Transthoracic echocardiography. M-mode, complete 2D, spectral Doppler, and color Doppler. Birthdate: Patient birthdate: [birth year]. Age: Patient is 80 yr old. Sex: Gender: male. Height: Height: 166.4 cm. Height: 65.5 in. Weight: Weight: 81.8 kg. Weight: 180 lb. Body mass index: BMI: 29.6 kg/m^2. Body surface area: BSA: 1.97 m^2. Blood pressure: 144/51 Patient status: Outpatient. Study date: Study date: 01/05/2014. Study time: 10:17 AM. Location: Echo laboratory.  -------------------------------------------------------------------  ------------------------------------------------------------------- Left ventricle: The cavity size was normal. There was mild concentric hypertrophy. Systolic function was normal. The estimated ejection fraction was in the range of 50% to 55%. Wall motion was normal; there were no regional wall motion abnormalities. Doppler parameters are consistent with abnormal left ventricular relaxation (grade 1 diastolic dysfunction). There was no evidence of elevated ventricular filling pressure by Doppler parameters.  ------------------------------------------------------------------- Aortic valve: A bioprosthetic valve sits well in the aortic position, there is no abnormal rocking motion. Leaflets open well. The transaortic gradients are 27 and 45 mmHg and are elevated for this type of prosthesis. A moderate central aortic insufficiency with eccentric anteriorly directed jet is present. There a very small jets of paravalvular leak present. Doppler: There  was moderate regurgitation. Mean velocity ratio of LVOT to aortic valve: 0.2.  ------------------------------------------------------------------- Aorta: Aortic root: The aortic root was normal in size.  ------------------------------------------------------------------- Mitral valve: Structurally normal valve. Mobility was not restricted. Doppler: Transvalvular velocity was within the normal range. There was no evidence for stenosis. There was mild regurgitation.  ------------------------------------------------------------------- Left atrium: The atrium was moderately dilated.  ------------------------------------------------------------------- Right ventricle: Poorly visualized. The cavity size was normal. Wall thickness was normal. Systolic function was normal.  ------------------------------------------------------------------- Pulmonic valve: Structurally normal valve. Cusp separation was normal. Doppler: Transvalvular velocity was within the normal range. There was no evidence for stenosis. There was mild regurgitation.  ------------------------------------------------------------------- Tricuspid valve: Poorly visualized. Structurally normal valve. Doppler: Transvalvular velocity was within the normal range. There was no regurgitation.  ------------------------------------------------------------------- Pulmonary artery: The main pulmonary artery  was normal-sized. Systolic pressure was within the normal range.  ------------------------------------------------------------------- Right atrium: The atrium was normal in size.  ------------------------------------------------------------------- Pericardium: There was no pericardial effusion.  ------------------------------------------------------------------- Systemic veins: Inferior vena cava: The vessel was normal in size.  ------------------------------------------------------------------- Prepared and  Electronically Authenticated by  Ena Dawley, M.D. 2015-06-16T14:01:27  ------------------------------------------------------------------- Measurements  Left ventricle Value Reference LV ID, ED, PLAX chordal (H) 52.8 mm 43 - 52 LV ID, ES, PLAX chordal (H) 43.5 mm 23 - 38 LV fx shortening, PLAX chordal (L) 18 % >=29 LV PW thickness, ED 12.3 mm --------- IVS/LV PW ratio, ED (N) 1.18 <=1.3 LV e&', lateral 7.29 cm/s --------- LV E/e&', lateral 7.12 --------- LV e&', medial 4.13 cm/s --------- LV E/e&', medial 12.57 --------- LV e&', average 5.71 cm/s --------- LV E/e&', average 9.09 ---------  Ventricular septum Value Reference IVS thickness, ED 14.5 mm ---------  LVOT Value Reference LVOT mean velocity, S 44.1 cm/s ---------  Aortic valve Value Reference Aortic valve peak velocity, S 336 cm/s --------- Aortic valve mean velocity, S 222 cm/s --------- Aortic valve VTI, S 82.5 cm --------- Velocity ratio, mean, LVOT/AV 0.2 --------- Aortic regurg peak velocity 222 cm/s --------- Aortic regurg pressure half-time 236 ms --------- Aortic regurg peak gradient 20 mm Hg ---------  Left atrium Value Reference LA ID, A-P, ES 45 mm --------- LA ID/bsa, A-P (H) 2.29 cm/m^2 <=2.2  Mitral valve Value Reference Mitral E-wave peak velocity 51.9 cm/s --------- Mitral A-wave peak velocity 86.8 cm/s --------- Mitral deceleration time (H) 366 ms 150 - 230 Mitral E/A ratio, peak 0.6 ---------  Right ventricle Value Reference RV s&', lateral, S 8.38 cm/s ---------  Legend: (L) and (H) mark values outside specified reference range.  (N) marks values inside specified reference range.    Transesophageal Echocardiography  Patient: [name] [name], [name] [name] MR #: [id number] Study Date: 09/02/2013 Gender: M Age: 82 Height: 167.6cm Weight: 81.4kg BSA: 1.57m^2 Pt. Status: Room: [facility]  SONOGRAPHER Wyatt Mage, RDCS ADMITTING Ena Dawley, M.D. ATTENDING Ena Dawley, M.D. ORDERING  Ena Dawley, M.D. PERFORMING Ena Dawley, M.D. cc:  ------------------------------------------------------------ LV EF: 50% - 55%  ------------------------------------------------------------ Indications: Aortic insufficiency 424.1.  ------------------------------------------------------------ Study Conclusions  - Left ventricle: Mild concentric left ventricular hypertrophy. Systolic function was normal. The estimated ejection fraction was in the range of 50% to 55%. - Aortic valve: A bioprosthetic valve sits well in the aortic position, there is no abnormal rocking motion. Leaflets open well and there is no stenosis. A moderate central aortic insufficiency with eccentric anteriorly directed jetispresent. There are two very small jets of paravalvular leak present at 5 o'clock (adjacent to RVOT in the proximity to the pulmonic valve) and at 10 o'clock (adjacent to the right atrium) when looking at the short axis of TEE. 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 There is severe focal partially mobile plague in the ascending thoracic aorta mesuring 5 x 3 mm. - Mitral valve: Mildly thickened leaflets . Mild regurgitation. - Left atrium: The atrium was mildly dilated. No evidence of thrombus in the atrial cavity or appendage. No evidence of thrombus in the appendage. No evidence of thrombus in the atrial cavity or appendage. - Right atrium: No evidence of thrombus in the atrial cavity or appendage. No evidence of thrombus in the atrial cavity or appendage. - Atrial septum: No defect or patent foramen ovale was identified. Echo contrast study showed no right-to-left atrial level shunt, following an increase in RA pressure induced by  provocative maneuvers. - Tricuspid valve: A prosthesis was present and functioning normally. The prosthesis had a normal range of motion. The sewing ring appeared normal,  had no rocking motion, and showed no evidence of dehiscence. - Pulmonic valve: No evidence of vegetation. Transesophageal echocardiography. 2D and color Doppler. Height: Height: 167.6cm. Height: 66in. Weight: Weight: 81.4kg. Weight: 179lb. Body mass index: BMI: 29kg/m^2. Body surface area: BSA: 1.1m^2. Blood pressure: 147/48. Patient status: Outpatient. Location: Endoscopy.  ------------------------------------------------------------  ------------------------------------------------------------ Left ventricle: Mild concentric left ventricular hypertrophy. Systolic function was normal. The estimated ejection fraction was in the range of 50% to 55%.  ------------------------------------------------------------ Aortic valve: A bioprosthetic valve sits well in the aortic position, there is no abnormal rocking motion. Leaflets open well and there is no stenosis. A moderate central aortic insufficiency with eccentric anteriorly directed jet is present. There are two very small jets of paravalvular leak present at 5 o'clock (adjacent to RVOT in the proximity to the pulmonic valve) and at 10 o'clock (adjacent to the right atrium) when looking at the short axis of TEE. Cusp separation was normal.  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 There is severe focal partially mobile plague in the ascending thoracic aorta mesuring 5 x 3 mm. There was no atheroma. There was no evidence for dissection. Aortic root: The aortic root was not dilated. Ascending aorta: The ascending aorta was normal in size. Aortic arch: The aortic arch was normal in size. Descending aorta: The descending aorta was normal in size.  ------------------------------------------------------------ Mitral valve: Mildly thickened leaflets . Leaflet separation was normal. Doppler: Mild  regurgitation.  ------------------------------------------------------------ Left atrium: The atrium was mildly dilated. No evidence of thrombus in the atrial cavity or appendage. No evidence of thrombus in the appendage. No evidence of thrombus in the atrial cavity or appendage. The appendage is of normal size. Emptying velocity was normal.  ------------------------------------------------------------ Atrial septum: No defect or patent foramen ovale was identified. Echo contrast study showed no right-to-left atrial level shunt, following an increase in RA pressure induced by provocative maneuvers.  ------------------------------------------------------------ Right ventricle: The cavity size was normal. Wall thickness was normal. Systolic function was normal.  ------------------------------------------------------------ Pulmonic valve: Structurally normal valve. Cusp separation was normal. No evidence of vegetation. Doppler: Trivial regurgitation.  ------------------------------------------------------------ Tricuspid valve: A prosthesis was present and functioning normally. The prosthesis had a normal range of motion. The sewing ring appeared normal, had no rocking motion, and showed no evidence of dehiscence. Leaflet separation was normal. Doppler: Trivial regurgitation.  ------------------------------------------------------------ Pulmonary artery: The main pulmonary artery was normal-sized.  ------------------------------------------------------------ Right atrium: The atrium was normal in size. No evidence of thrombus in the atrial cavity or appendage. No evidence of thrombus in the atrial cavity or appendage. The appendage was morphologically a right appendage.  ------------------------------------------------------------ Pericardium: The pericardium was normal in appearance. There was no pericardial  effusion.  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 There is severe focal partially mobile plague in the ascending thoracic aorta mesuring 5 x 3 mm.  ------------------------------------------------------------ Prepared and Electronically Authenticated by  Ena Dawley, M.D. 2015-02-11T12:29:49.187    Cardiac Catheterization Procedure Note   Name: [name] [name]  MRN: [id number]  DOB: [birth year]  Procedure: Right Heart Cath, Left Heart Cath, Selective Coronary Angiography, aortic root angiography  Indication: CHF, aortic insufficiency  Procedural Details: The right wrist and antecubital fossa were prepped, draped, and  anesthetized with 1% lidocaine. Using the modified Seldinger technique a 5/6 French sheath was placed in the right radial artery and a 5 French sheath was placed in the right antecubital vein. A Swan-Ganz catheter was used for the right heart catheterization. Standard protocol was followed for recording of right heart pressures and sampling of oxygen saturations. Fick cardiac output was calculated. Standard Judkins catheters were used for selective coronary angiography and aortic root angiography. LV pressure was recorded with the JR4 catheter. There were no immediate procedural complications. The patient was transferred to the post catheterization recovery area for further monitoring.  Procedural Findings:  Hemodynamics  RA 10  RV 45/12  PA 48/23 mean 34  PCWP 18  LV 159/19  AO 130/51  Oxygen saturations:  PA 63  AO 93  Cardiac Output (Fick) 4.3  Cardiac Index (Fick) 2.3  Coronary angiography:  Coronary dominance: right  Left mainstem: Widely patent without obstructive disease  Left anterior descending (LAD): Large vessel. Widely patent without obstructive disease, wraps around the LV  apex. The first and second diagonals are small vessels with moderate ostial stenoses. The 3rd diagonal is larger and has no significant disease.  Left circumflex (LCx): The proximal LCx is patent. The mid-LCx has 40% eccentric plaque just after the first OM. The stented segment in the mid-circumflex is patent. The second OM is patent. There is no high-grade disease in the LCx distribution.  Right coronary artery (RCA): The RCA has 95% stenosis in the mid-portion of the vessel just after the acute marginal branch. The PDA and PLA branches are very small.  Aortic root angiography: There is a bioprosthetic aortic valve present. The Ascending aorta appears non-dilated. There is severe AI.  Final Conclusions:  1. Nonobstructive CAD of the LM, LAD, and LCx with continued patency of the stented segment in the LCx.  2. Chronic severe stenosis of the RCA (small vessel)  3. Severe AI  Recommendations: Continued evaluation in the Multidisciplinary [facility]. Will review findings with Dr Cyndia Bent.  Sherren Mocha  02/08/2014, 1:00 PM     CT ANGIOGRAPHY CHEST, ABDOMEN AND PELVIS  TECHNIQUE:  Multidetector CT imaging through the chest, abdomen and pelvis was  performed using the standard protocol during bolus administration of  intravenous contrast. Multiplanar reconstructed images and MIPs were  obtained and reviewed to evaluate the vascular anatomy.  CONTRAST: 174mL OMNIPAQUE IOHEXOL 350 MG/ML SOLN  COMPARISON: CT of the abdomen and pelvis 01/15/2008.  FINDINGS:  CT CHEST FINDINGS  Mediastinum: Heart size is normal. There is no significant  pericardial fluid, thickening or pericardial calcification. There is  atherosclerosis of the thoracic aorta, the great vessels of the  mediastinum and the coronary arteries, including calcified  atherosclerotic plaque in the left main, left anterior descending,  left circumflex and right coronary arteries. Status post median  sternotomy for aortic root  replacement (a bioprosthetic aortic valve  is noted). No pathologically enlarged mediastinal or hilar lymph  nodes. Esophagus is unremarkable in appearance.  Lungs/Pleura: Mild scarring in the lung bases bilaterally. No acute  consolidative airspace disease. No pleural effusions. Accurate  assessment for small pulmonary nodules is severely limited by a  large amount of patient respiratory motion. With these limitations  in mind, no suspicious appearing pulmonary nodules or masses are  noted.  Musculoskeletal: There are no aggressive appearing lytic or blastic  lesions noted in the visualized portions of the skeleton.  CT ABDOMEN AND PELVIS FINDINGS  Abdomen/Pelvis: Small foci of high attenuation are seen  layering  dependently in the neck of the gallbladder, compatible with small  gallstones. No current findings to suggest acute cholecystitis at  this time. The appearance of the liver, pancreas, spleen and  bilateral adrenal glands is unremarkable. Mild atrophy of the  kidneys bilaterally. Cortical thinning in the posterior aspect of  the interpolar region of the right kidney, presumably post  infectious or inflammatory scarring.  Extensive atherosclerosis throughout the abdominal and pelvic  vasculature, including vascular findings and measurements pertinent  to potential TAVR procedure, as detailed below. Post procedural  changes of aortobi-iliac endograft are noted, originating  immediately beneath the level the renal artery origins extending  into the mid common iliac arteries bilaterally. The aortic and iliac  portions of the graft are all widely patent, without evidence of in  stent restenosis. The excluded aneurysm sac currently measures 3.7 x  4.0 cm. No high attenuation is identified within the excluded  aneurysm sac to suggest presence of endoleak on today's arterial  phase examination.  Extensive diverticulosis of the sigmoid colon, without surrounding  inflammatory changes  to suggest an acute diverticulitis at this  time. No significant volume of ascites. No pneumoperitoneum. No  pathologic distention of small bowel. No lymphadenopathy identified  in the abdomen or pelvis. Prostate gland appears mildly enlarged,  however, there is profound median lobe hypertrophy which extends  into the base of the urinary bladder. Urinary bladder is otherwise  unremarkable in appearance.  Musculoskeletal: There are no aggressive appearing lytic or blastic  lesions noted in the visualized portions of the skeleton.  VASCULAR MEASUREMENTS PERTINENT TO TAVR:  AORTA:  Minimal Aortic Diameter - 21 x 23 mm  Severity of Aortic Calcification - Moderate to severe  RIGHT PELVIS:  Right Common Iliac Artery -  Minimal Diameter - 6.6 x 4.8 mm immediately distal to stent graft  Tortuosity - Moderate  Calcification - Mild  Right External Iliac Artery -  Minimal Diameter - 6.6 x 6.7 mm  Tortuosity - Severe  Calcification - Minimal  Right Common Femoral Artery -  Minimal Diameter - 5.8 x 5.5 mm  Tortuosity - Mild  Calcification - Mild  LEFT PELVIS:  Left Common Iliac Artery -  Minimal Diameter - 9.8 x 8.8 mm  Tortuosity - Mild to moderate  Calcification - Moderate  Left External Iliac Artery -  Minimal Diameter - 6.0 x 6.3 mm  Tortuosity - Moderate to severe  Calcification - Minimal  Left Common Femoral Artery -  Minimal Diameter - 7.4 x 7.2 mm  Tortuosity - Mild  Calcification - Mild  Review of the MIP images confirms the above findings.  IMPRESSION:  1. Findings and measurements pertinent to potential TAVR procedure,  as detailed above. This patient does appear to have suitable pelvic  arterial access on the left side.  2. Bioprosthetic aortic valve in position.  3. Aortobi-iliac endograft is widely patent.  4. Cholelithiasis without evidence to suggest acute cholecystitis.  5. Severe median lobe hypertrophy in the prostate gland incidentally  noted.  6. Additional  incidental findings, as above.  Electronically Signed  By: Vinnie Langton M.D.  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  He has developed progressive symptoms of shortness of breath consistent with chronic combined systolic and diastolic congestive heart failure that has continued to get worse despite optimal medical therapy over the past 6-8 months. The patient currently experiences symptoms with mild activity and intermittently at rest, consistent with New York Heart Association functional class III. There has been slight deterioration in left ventricular systolic function. Risks associated with conventional surgical redo aortic valve replacement would unquestionably be high given the patient's advanced age, previous aortic valve surgery, and numerous comorbid medical problems. I agree that transcatheter aortic valve replacement may prove to be a reasonable alternative to high-risk conventional surgery. The patient's original aortic valve prosthesis was 21 mm in diameter which likely can be treated successfully with currently available transcatheter aortic valve devices, although the patient may be left with some degree of residual aortic stenosis. Based upon review of the patient's CT angiogram, the patient may be a reasonable candidate for transfemoral approach despite the fact that he has had previous endovascular repair of abdominal aortic aneurysm.  The endoluminal diameter of the aortic graft and iliac vessels  appears more than adequate. The transapical approach could be considered as an alternative if sheath placement through the transfemoral approach proves to be unsuccessful.   Plan:  The patient was counseled at length regarding treatment alternatives for management of severe symptomatic prosthetic valve disease with aortic stenosis and aortic insufficiency. Alternative approaches such as conventional redo aortic valve replacement, transcatheter aortic valve replacement, and palliative medical therapy were compared and contrasted at length.  The risks associated with conventional surgical redo aortic valve replacement were been discussed in detail, as were expectations for post-operative convalescence. Long-term prognosis with medical therapy was discussed. This discussion was placed in the context of the patient's own specific clinical presentation and past medical history.   Following the decision to proceed with transcatheter aortic valve replacement, a discussion has been held regarding what types of management strategies would be attempted intraoperatively in the event of life-threatening complications, including whether or not the patient would be considered a candidate for the use of cardiopulmonary bypass and/or conversion to open sternotomy for attempted surgical intervention.  The patient has been advised of a variety of complications that might develop including but not limited to risks of death, stroke, paravalvular leak, aortic dissection or other major vascular complications, aortic annulus rupture, device embolization, cardiac rupture or perforation, mitral regurgitation, acute myocardial infarction, arrhythmia, heart block or bradycardia requiring permanent pacemaker placement, congestive heart failure, respiratory failure, renal failure, pneumonia, infection, other late complications related to structural valve deterioration or migration, or other complications that might ultimately cause a  temporary or permanent loss of functional independence or other long term morbidity.  All questions have been answered. The patient has tentatively been scheduled for surgery on Tuesday, 03/23/2014.     Valentina Gu. Roxy Manns, MD 03/09/2014 12:52 PM

## 2014-03-17 ENCOUNTER — Other Ambulatory Visit: Payer: Self-pay

## 2014-03-17 DIAGNOSIS — I359 Nonrheumatic aortic valve disorder, unspecified: Secondary | ICD-10-CM

## 2014-03-19 ENCOUNTER — Encounter (HOSPITAL_COMMUNITY): Payer: Self-pay

## 2014-03-19 ENCOUNTER — Encounter (HOSPITAL_COMMUNITY)
Admission: RE | Admit: 2014-03-19 | Discharge: 2014-03-19 | Disposition: A | Discharge status: Home / Self Care | Payer: Medicare Other | Source: Ambulatory Visit | Attending: Surgery | Admitting: Surgery

## 2014-03-19 ENCOUNTER — Ambulatory Visit (HOSPITAL_COMMUNITY)
Admission: RE | Admit: 2014-03-19 | Discharge: 2014-03-19 | Disposition: A | Discharge status: Home / Self Care | Payer: Medicare Other | Source: Ambulatory Visit | Attending: Surgery | Admitting: Surgery

## 2014-03-19 VITALS — BP 148/40 | HR 73 | Temp 97.7°F | Resp 20 | Ht 65.0 in | Wt 180.6 lb

## 2014-03-19 DIAGNOSIS — Z01810 Encounter for preprocedural cardiovascular examination: Secondary | ICD-10-CM | POA: Insufficient documentation

## 2014-03-19 DIAGNOSIS — I359 Nonrheumatic aortic valve disorder, unspecified: Secondary | ICD-10-CM

## 2014-03-19 DIAGNOSIS — I350 Nonrheumatic aortic (valve) stenosis: Secondary | ICD-10-CM

## 2014-03-19 DIAGNOSIS — I059 Rheumatic mitral valve disease, unspecified: Secondary | ICD-10-CM | POA: Insufficient documentation

## 2014-03-19 HISTORY — DX: Pneumonia, unspecified organism: J18.9

## 2014-03-19 HISTORY — DX: Acute myocardial infarction, unspecified: I21.9

## 2014-03-19 HISTORY — DX: Depression, unspecified: F32.A

## 2014-03-19 HISTORY — DX: Major depressive disorder, single episode, unspecified: F32.9

## 2014-03-19 HISTORY — DX: Cerebral infarction, unspecified: I63.9

## 2014-03-19 HISTORY — DX: Unspecified osteoarthritis, unspecified site: M19.90

## 2014-03-19 HISTORY — DX: Shortness of breath: R06.02

## 2014-03-19 LAB — CBC
HCT: 40.6 % (ref 39.0–52.0)
Hemoglobin: 14.2 g/dL (ref 13.0–17.0)
MCH: 33 pg (ref 26.0–34.0)
MCHC: 35 g/dL (ref 30.0–36.0)
MCV: 94.4 fL (ref 78.0–100.0)
PLATELETS: 120 10*3/uL — AB (ref 150–400)
RBC: 4.3 MIL/uL (ref 4.22–5.81)
RDW: 13.2 % (ref 11.5–15.5)
WBC: 5.7 10*3/uL (ref 4.0–10.5)

## 2014-03-19 LAB — COMPREHENSIVE METABOLIC PANEL
ALT: 19 U/L (ref 0–53)
AST: 27 U/L (ref 0–37)
Albumin: 3.7 g/dL (ref 3.5–5.2)
Alkaline Phosphatase: 81 U/L (ref 39–117)
Anion gap: 12 (ref 5–15)
BUN: 21 mg/dL (ref 6–23)
CALCIUM: 8.8 mg/dL (ref 8.4–10.5)
CO2: 25 mEq/L (ref 19–32)
CREATININE: 1.32 mg/dL (ref 0.50–1.35)
Chloride: 103 mEq/L (ref 96–112)
GFR, EST AFRICAN AMERICAN: 54 mL/min — AB (ref >90)
GFR, EST NON AFRICAN AMERICAN: 46 mL/min — AB (ref >90)
Glucose, Bld: 103 mg/dL — ABNORMAL HIGH (ref 70–99)
Potassium: 5 mEq/L (ref 3.7–5.3)
Sodium: 140 mEq/L (ref 137–147)
TOTAL PROTEIN: 6.7 g/dL (ref 6.0–8.3)
Total Bilirubin: 1.9 mg/dL — ABNORMAL HIGH (ref 0.3–1.2)

## 2014-03-19 LAB — URINALYSIS, ROUTINE W REFLEX MICROSCOPIC
Bilirubin Urine: NEGATIVE
GLUCOSE, UA: NEGATIVE mg/dL
Hgb urine dipstick: NEGATIVE
KETONES UR: NEGATIVE mg/dL
Leukocytes, UA: NEGATIVE
Nitrite: NEGATIVE
PROTEIN: NEGATIVE mg/dL
Specific Gravity, Urine: 1.018 (ref 1.005–1.030)
UROBILINOGEN UA: 0.2 mg/dL (ref 0.0–1.0)
pH: 5.5 (ref 5.0–8.0)

## 2014-03-19 LAB — APTT: aPTT: 33 s (ref 24–37)

## 2014-03-19 LAB — BLOOD GAS, ARTERIAL
Acid-base deficit: 1.7 mmol/L (ref 0.0–2.0)
Bicarbonate: 22.1 meq/L (ref 20.0–24.0)
Drawn by: 421801
FIO2: 0.21 %
O2 Saturation: 94.4 %
Patient temperature: 98.6
TCO2: 23.2 mmol/L (ref 0–100)
pCO2 arterial: 34.6 mmHg — ABNORMAL LOW (ref 35.0–45.0)
pH, Arterial: 7.422 (ref 7.350–7.450)
pO2, Arterial: 71.5 mmHg — ABNORMAL LOW (ref 80.0–100.0)

## 2014-03-19 LAB — SURGICAL PCR SCREEN
MRSA, PCR: NEGATIVE
Staphylococcus aureus: NEGATIVE

## 2014-03-19 LAB — PROTIME-INR
INR: 1.04 (ref 0.00–1.49)
PROTHROMBIN TIME: 13.6 s (ref 11.6–15.2)

## 2014-03-19 LAB — HEMOGLOBIN A1C
Hgb A1c MFr Bld: 6.3 % — ABNORMAL HIGH
Mean Plasma Glucose: 134 mg/dL — ABNORMAL HIGH

## 2014-03-19 NOTE — Progress Notes (Addendum)
VASCULAR LAB PRELIMINARY  PRELIMINARY  PRELIMINARY  PRELIMINARY  Pre-op Cardiac Surgery  Carotid Findings:  Bilateral:  1-39% ICA stenosis.  Vertebral artery flow is antegrade.     Upper Extremity Right Left  Brachial Pressures 135 Triphasic 131 Triphasic  Radial Waveforms Triphasic Triphasic  Ulnar Waveforms Triphasic Triphasic  Palmar Arch (Allen's Test) Normal Normal   Findings:  Doppler waveforms remained normal with both radial and ulnar compressions   Taha Dimond, RVS 03/19/2014, 1:29 PM

## 2014-03-19 NOTE — Progress Notes (Signed)
Primary - wong Cardiologist - cooper Cath July 2015 in epic, echo in 2/15

## 2014-03-19 NOTE — Pre-Procedure Instructions (Signed)
EDVIN ALBUS  03/19/2014   Your procedure is scheduled on:  Tuesday, September 1st  Report to [facility] Admitting at 0830 AM.  Call this number if you have problems the morning of surgery: [phone]   Remember:   Do not eat food or drink liquids after midnight.   Take these medicines the morning of surgery with A SIP OF WATER: proscar, synthroid, paxil   Do not wear jewelry.  Do not wear lotions, powders, or perfumes. You may wear deodorant.  Do not shave 48 hours prior to surgery. Men may shave face and neck.  Do not bring valuables to the hospital.  [facility] is not responsible for any belongings or valuables.               Contacts, dentures or bridgework may not be worn into surgery.  Leave suitcase in the car. After surgery it may be brought to your room.  For patients admitted to the hospital, discharge time is determined by your treatment team.         Please read over the following fact sheets that you were given: Pain Booklet, Coughing and Deep Breathing, Blood Transfusion Information, MRSA Information and Surgical Site Infection Prevention [facility] - Preparing for Surgery  Before surgery, you can play an important role.  Because skin is not sterile, your skin needs to be as free of germs as possible.  You can reduce the number of germs on you skin by washing with CHG (chlorahexidine gluconate) soap before surgery.  CHG is an antiseptic cleaner which kills germs and bonds with the skin to continue killing germs even after washing.  Please DO NOT use if you have an allergy to CHG or antibacterial soaps.  If your skin becomes reddened/irritated stop using the CHG and inform your nurse when you arrive at Short Stay.  Do not shave (including legs and underarms) for at least 48 hours prior to the first CHG shower.  You may shave your face.  Please follow these instructions carefully:   1.  Shower with CHG Soap the night before surgery and the morning of  Surgery.  2.  If you choose to wash your hair, wash your hair first as usual with your normal shampoo.  3.  After you shampoo, rinse your hair and body thoroughly to remove the shampoo.  4.  Use CHG as you would any other liquid soap.  You can apply CHG directly to the skin and wash gently with scrungie or a clean washcloth.  5.  Apply the CHG Soap to your body ONLY FROM THE NECK DOWN.  Do not use on open wounds or open sores.  Avoid contact with your eyes, ears, mouth and genitals (private parts).  Wash genitals (private parts) with your normal soap.  6.  Wash thoroughly, paying special attention to the area where your surgery will be performed.  7.  Thoroughly rinse your body with warm water from the neck down.  8.  DO NOT shower/wash with your normal soap after using and rinsing off the CHG Soap.  9.  Pat yourself dry with a clean towel.            10.  Wear clean pajamas.            11.  Place clean sheets on your bed the night of your first shower and do not sleep with pets.  Day of Surgery  Do not apply any lotions/deoderants the morning of  surgery.  Please wear clean clothes to the hospital/surgery center.

## 2014-03-19 NOTE — Progress Notes (Signed)
03/19/14 1002  OBSTRUCTIVE SLEEP APNEA  Have you ever been diagnosed with sleep apnea through a sleep study? No  Do you snore loudly (loud enough to be heard through closed doors)?  0  Do you often feel tired, fatigued, or sleepy during the daytime? 0  Has anyone observed you stop breathing during your sleep? 1  Do you have, or are you being treated for high blood pressure? 1  BMI more than 35 kg/m2? 0  Age over 78 years old? 1  Neck circumference greater than 40 cm/16 inches? 1  Gender: 1  Obstructive Sleep Apnea Score 5  Score 4 or greater  Results sent to PCP

## 2014-03-22 MED ORDER — EPINEPHRINE HCL 1 MG/ML IJ SOLN
0.5000 ug/min | INTRAVENOUS | Status: DC
  Filled 2014-03-22: qty 4

## 2014-03-22 MED ORDER — MAGNESIUM SULFATE 50 % IJ SOLN
40.0000 meq | INTRAMUSCULAR | Status: DC
  Filled 2014-03-22: qty 10

## 2014-03-22 MED ORDER — PHENYLEPHRINE HCL 10 MG/ML IJ SOLN
30.0000 ug/min | INTRAVENOUS | Status: DC
  Filled 2014-03-22: qty 2

## 2014-03-22 MED ORDER — DEXMEDETOMIDINE HCL IN NACL 400 MCG/100ML IV SOLN
0.1000 ug/kg/h | INTRAVENOUS | Status: DC
  Filled 2014-03-22: qty 100

## 2014-03-22 MED ORDER — LEVOFLOXACIN IN D5W 500 MG/100ML IV SOLN
500.0000 mg | INTRAVENOUS | Status: DC
  Filled 2014-03-22: qty 100

## 2014-03-22 MED ORDER — DOPAMINE-DEXTROSE 3.2-5 MG/ML-% IV SOLN
2.0000 ug/kg/min | INTRAVENOUS | Status: DC
  Filled 2014-03-22: qty 250

## 2014-03-22 MED ORDER — SODIUM CHLORIDE 0.9 % IV SOLN
INTRAVENOUS | Status: DC
  Filled 2014-03-22: qty 2.5

## 2014-03-22 MED ORDER — SODIUM CHLORIDE 0.9 % IV SOLN
INTRAVENOUS | Status: DC
  Filled 2014-03-22: qty 30

## 2014-03-22 MED ORDER — VANCOMYCIN HCL 10 G IV SOLR
1250.0000 mg | INTRAVENOUS | Status: DC
  Filled 2014-03-22: qty 1250

## 2014-03-22 MED ORDER — SODIUM CHLORIDE 0.9 % IV SOLN
INTRAVENOUS | Status: DC
  Filled 2014-03-22 (×2): qty 30

## 2014-03-22 MED ORDER — POTASSIUM CHLORIDE 2 MEQ/ML IV SOLN
80.0000 meq | INTRAVENOUS | Status: DC
  Filled 2014-03-22: qty 40

## 2014-03-22 MED ORDER — NOREPINEPHRINE BITARTRATE 1 MG/ML IV SOLN
0.0000 ug/min | INTRAVENOUS | Status: AC
  Administered 2014-03-23: 2 ug/min via INTRAVENOUS
  Filled 2014-03-22: qty 8

## 2014-03-22 MED ORDER — DOPAMINE-DEXTROSE 3.2-5 MG/ML-% IV SOLN: 2.0000 ug/kg/min | INTRAVENOUS | Status: DC

## 2014-03-22 MED ORDER — NITROGLYCERIN IN D5W 200-5 MCG/ML-% IV SOLN
2.0000 ug/min | INTRAVENOUS | Status: DC
  Filled 2014-03-22: qty 250

## 2014-03-22 MED ORDER — INSULIN REGULAR HUMAN 100 UNIT/ML IJ SOLN
INTRAMUSCULAR | Status: DC
  Filled 2014-03-22: qty 2.5

## 2014-03-22 MED ORDER — NOREPINEPHRINE BITARTRATE 1 MG/ML IV SOLN
0.0000 ug/min | INTRAVENOUS | Status: DC
  Filled 2014-03-22: qty 8

## 2014-03-22 NOTE — H&P (Signed)
[address]       [place], [address]             [phone]      Cardiothoracic Surgery History and Physical   HPI:  The patient is an 80 year old gentleman with a history of hypertension, hyperlipidemia, diffuse vascular disease s/p EVAR 2004, prior stroke with recovery, and coronary disease. 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 He subsequently had a stent place in the LCS in 1997. He underwent AVR by me in July, 2003 for severe AS using an Edwards Perimount pericardial valve. His LCX stent was patent at that time and the RCA was occluded. He had an echo in October 2014 which showed moderate central AI through the prosthetic valve and some perivalvular leak. Since he felt well and had normal LV function continued followup was planned. I saw in on 09/03/2013 and he was reporting a couple month history of progressive shortness of breath and at the end of January he developed orthopnea and PND. He presented to Dr. Burt Knack on 08/21/2013 after he was unable to sleep due to orthopnea. He noted swelling in his legs but no chest pain or pressure. He was treated with diuresis with quick resolution of his leg edema and shortness of breath. When I saw him in February he said he felt well. He underwent TEE in Feb showing moderate central AI through the prosthetic valve with 2 very small jets of paravalvular leak and an EF of 55-60%. He was felt to be very high risk for conventional redo AVR and possible CABG and since he was doing well in Feb we decided to continue following him with a plan for possible valve in valve TAVR if he worsened.  He continues to be under a lot of stress at home, taking care of his wife who has advanced dementia. The patient has been having more problems with orthopnea. He sleeps in a chair most of the time so that he can keep his head elevated. He also complains of exertional dyspnea with low-level activities. He has not had chest  pain or pressure. He denies lightheadedness or syncope. He's had only mild leg swelling, but not to the degree it has been in the past.  He underwent cath on 02/08/2014 as part of his workup for TAVR. This showed nonobstructive CAD with continued patency of the stented segment of the LCX. There was chronic severe stenosis of the small RCA. There was clearly severe AI on root injection.  Current Outpatient Prescriptions   Medication  Sig  Dispense  Refill   .  aspirin 81 MG tablet  Take 81 mg by mouth daily.     Marland Kitchen  atorvastatin (LIPITOR) 80 MG tablet  Take 1 tablet (80 mg total) by mouth daily.  90 tablet  3   .  B Complex-C (SUPER B COMPLEX) TABS  Take 1 tablet by mouth daily.     Marland Kitchen  ezetimibe (ZETIA) 10 MG tablet  Take 1 tablet (10 mg total) by mouth daily.  90 tablet  3   .  finasteride (PROSCAR) 5 MG tablet  Take 1 tablet (5 mg total) by mouth daily.  90 tablet  3   .  furosemide (LASIX) 40 MG tablet  Take 0.5 tablets (20 mg total) by mouth daily. Take extra half tablet ( 20 mg) for weight gain up to 2-3 pounds over night  30 tablet  3   .  levothyroxine (SYNTHROID, LEVOTHROID) 50 MCG tablet  Take 1 tablet (50 mcg total) by mouth daily.  90 tablet  3   .  losartan (COZAAR) 50 MG tablet  Take 1 tablet (50 mg total) by mouth daily.  90 tablet  3   .  PARoxetine (PAXIL) 20 MG tablet  Take 0.5 tablets (10 mg total) by mouth every morning.  45 tablet  3   .  potassium chloride SA (K-DUR,KLOR-CON) 10 MEQ tablet  Take 1 pill every time you take Lasix  30 tablet  6    No current facility-administered medications for this visit.   Physical Exam:  BP 126/51  Pulse 74  Ht 5\' 5"  (1.651 m)  Wt 182 lb (82.555 kg)  BMI 30.29 kg/m2  SpO2 94%  Alert and oriented  Lungs clear  Heart: RRR, 2/6 systolic murmur over aorta, 3/6 diastolic decrescendo murmur at the LLSB radiating to the apex.  There is no peripheral edema  Diagnostic Tests:  Cardiac Catheterization Procedure Note  Name: LATOYA DISKIN  MRN:  [id number]  DOB: 1922-10-23  Procedure: Right Heart Cath, Left Heart Cath, Selective Coronary Angiography, aortic root angiography  Indication: CHF, aortic insufficiency  Procedural Details: The right wrist and antecubital fossa were prepped, draped, and anesthetized with 1% lidocaine. Using the modified Seldinger technique a 5/6 French sheath was placed in the right radial artery and a 5 French sheath was placed in the right antecubital vein. A Swan-Ganz catheter was used for the right heart catheterization. Standard protocol was followed for recording of right heart pressures and sampling of oxygen saturations. Fick cardiac output was calculated. Standard Judkins catheters were used for selective coronary angiography and aortic root angiography. LV pressure was recorded with the JR4 catheter. There were no immediate procedural complications. The patient was transferred to the post catheterization recovery area for further monitoring.  Procedural Findings:  Hemodynamics  RA 10  RV 45/12  PA 48/23 mean 34  PCWP 18  LV 159/19  AO 130/51  Oxygen saturations:  PA 63  AO 93  Cardiac Output (Fick) 4.3  Cardiac Index (Fick) 2.3  Coronary angiography:  Coronary dominance: right  Left mainstem: Widely patent without obstructive disease  Left anterior descending (LAD): Large vessel. Widely patent without obstructive disease, wraps around the LV apex. The first and second diagonals are small vessels with moderate ostial stenoses. The 3rd diagonal is larger and has no significant disease.  Left circumflex (LCx): The proximal LCx is patent. The mid-LCx has 40% eccentric plaque just after the first OM. The stented segment in the mid-circumflex is patent. The second OM is patent. There is no high-grade disease in the LCx distribution.  Right coronary artery (RCA): The RCA has 95% stenosis in the mid-portion of the vessel just after the acute marginal branch. The PDA and PLA branches are very small.  Aortic  root angiography: There is a bioprosthetic aortic valve present. The Ascending aorta appears non-dilated. There is severe AI.  Final Conclusions:  1. Nonobstructive CAD of the LM, LAD, and LCx with continued patency of the stented segment in the LCx.  2. Chronic severe stenosis of the RCA (small vessel)  3. Severe AI  Recommendations: Continued evaluation in the Multidisciplinary Valve Clinic. Will review findings with Dr Cyndia Bent.  Sherren Mocha  02/08/2014, 1:00 PM    *[facility] [address]. [place], [place] [address] [phone]  ------------------------------------------------------------------- Transthoracic Echocardiography  Patient: Gresham, Caetano MR #: [id number] Study  Date: 01/05/2014 Gender: M Age: 5 Height: 166.4 cm Weight: 81.8 kg BSA: 1.97 m^2 Pt. Status: Room:  SONOGRAPHER Diamond Nickel ORDERING Gerhardt, Goodnight, Outpatient  cc:  ------------------------------------------------------------------- LV EF: 50% - 55%  ------------------------------------------------------------------- Indications: Dyspnea 786.09.  ------------------------------------------------------------------- History: PMH: Aortic valve replacement. Coronary artery disease. Congestive heart failure. Risk factors: Hypertension.  ------------------------------------------------------------------- Study Conclusions  - Left ventricle: The cavity size was normal. There was mild concentric hypertrophy. Systolic function was normal. The estimated ejection fraction was in the range of 50% to 55%. Wall motion was normal; there were no regional wall motion abnormalities. Doppler parameters are consistent with abnormal left ventricular relaxation (grade 1 diastolic dysfunction). - Aortic valve: A bioprosthetic valve sits well in the aortic position, there is no abnormal rocking motion. Leaflets open well. The  transaortic gradients are 27 and 45 mmHg and are elevated for this type of prosthesis. A moderate central aortic insufficiency with eccentric anteriorly directed jet is present. There a very small jets of paravalvular leak present. There was moderate regurgitation. - Aortic root: The aortic root was normal in size. - Mitral valve: There was mild regurgitation. - Left atrium: The atrium was moderately dilated. - Tricuspid valve: Poorly visualized. There was no regurgitation. - Pulmonic valve: There was mild regurgitation.  Impressions:  - No significant chabges when compared to the study from 09/06/2013. The transaortic gradients are 27 and 45 mmHg and are elevated for this type of prosthesis however they are improved when compared to the prior study. Stable moderate central aortic insufficiency with minimal paravalvular leak.  Transthoracic echocardiography. M-mode, complete 2D, spectral Doppler, and color Doppler. Birthdate: Patient birthdate: 08-04-1922. Age: Patient is 80 yr old. Sex: Gender: male. Height: Height: 166.4 cm. Height: 65.5 in. Weight: Weight: 81.8 kg. Weight: 180 lb. Body mass index: BMI: 29.6 kg/m^2. Body surface area: BSA: 1.97 m^2. Blood pressure: 144/51 Patient status: Outpatient. Study date: Study date: 01/05/2014. Study time: 10:17 AM. Location: Echo laboratory.  -------------------------------------------------------------------  ------------------------------------------------------------------- Left ventricle: The cavity size was normal. There was mild concentric hypertrophy. Systolic function was normal. The estimated ejection fraction was in the range of 50% to 55%. Wall motion was normal; there were no regional wall motion abnormalities. Doppler parameters are consistent with abnormal left ventricular relaxation (grade 1 diastolic dysfunction). There was no evidence of elevated ventricular filling pressure by Doppler  parameters.  ------------------------------------------------------------------- Aortic valve: A bioprosthetic valve sits well in the aortic position, there is no abnormal rocking motion. Leaflets open well. The transaortic gradients are 27 and 45 mmHg and are elevated for this type of prosthesis. A moderate central aortic insufficiency with eccentric anteriorly directed jet is present. There a very small jets of paravalvular leak present. Doppler: There was moderate regurgitation. Mean velocity ratio of LVOT to aortic valve: 0.2.  ------------------------------------------------------------------- Aorta: Aortic root: The aortic root was normal in size.  ------------------------------------------------------------------- Mitral valve: Structurally normal valve. Mobility was not restricted. Doppler: Transvalvular velocity was within the normal range. There was no evidence for stenosis. There was mild regurgitation.  ------------------------------------------------------------------- Left atrium: The atrium was moderately dilated.  ------------------------------------------------------------------- Right ventricle: Poorly visualized. The cavity size was normal. Wall thickness was normal. Systolic function was normal.  ------------------------------------------------------------------- Pulmonic valve: Structurally normal valve. Cusp separation was normal. Doppler: Transvalvular velocity was within the normal range. There was no evidence for stenosis. There was mild regurgitation.  ------------------------------------------------------------------- Tricuspid valve: Poorly visualized. Structurally normal valve. Doppler: Transvalvular velocity was within the normal range. There  was no regurgitation.  ------------------------------------------------------------------- Pulmonary artery: The main pulmonary artery was normal-sized. Systolic pressure was within the normal  range.  ------------------------------------------------------------------- Right atrium: The atrium was normal in size.  ------------------------------------------------------------------- Pericardium: There was no pericardial effusion.  ------------------------------------------------------------------- Systemic veins: Inferior vena cava: The vessel was normal in size.  ------------------------------------------------------------------- Prepared and Electronically Authenticated by  Ena Dawley, M.D. 2015-06-16T14:01:27  ------------------------------------------------------------------- Measurements  Left ventricle Value Reference LV ID, ED, PLAX chordal (H) 52.8 mm 43 - 52 LV ID, ES, PLAX chordal (H) 43.5 mm 23 - 38 LV fx shortening, PLAX chordal (L) 18 % >=29 LV PW thickness, ED 12.3 mm --------- IVS/LV PW ratio, ED (N) 1.18 <=1.3 LV e&', lateral 7.29 cm/s --------- LV E/e&', lateral 7.12 --------- LV e&', medial 4.13 cm/s --------- LV E/e&', medial 12.57 --------- LV e&', average 5.71 cm/s --------- LV E/e&', average 9.09 ---------  Ventricular septum Value Reference IVS thickness, ED 14.5 mm ---------  LVOT Value Reference LVOT mean velocity, S 44.1 cm/s ---------  Aortic valve Value Reference Aortic valve peak velocity, S 336 cm/s --------- Aortic valve mean velocity, S 222 cm/s --------- Aortic valve VTI, S 82.5 cm --------- Velocity ratio, mean, LVOT/AV 0.2 --------- Aortic regurg peak velocity 222 cm/s --------- Aortic regurg pressure half-time 236 ms --------- Aortic regurg peak gradient 20 mm Hg ---------  Left atrium Value Reference LA ID, A-P, ES 45 mm --------- LA ID/bsa, A-P (H) 2.29 cm/m^2 <=2.2  Mitral valve Value Reference Mitral E-wave peak velocity 51.9 cm/s --------- Mitral A-wave peak velocity 86.8 cm/s --------- Mitral deceleration time (H) 366 ms 150 - 230 Mitral E/A ratio, peak 0.6 ---------  Right ventricle Value Reference RV  s&', lateral, S 8.38 cm/s ---------  Legend: (L) and (H) mark values outside specified reference range.  (N) marks values inside specified reference range.   Impression:   He has severe AI from a degenerated 21 mm Edwards Perimount valve with NYHA class III symptoms from acute on chronic diastolic congestive heart failure. His symptoms have been worsening over the last 6 months and are very limiting at this point. His operative risk for conventional redo AVR +/- CABG would be high with an estimated 30 day mortality of greater than 15% given his advanced age, redo surgery, and stage III CKD. He would also have a very difficult and prolonged recovery from open surgery. I think valve in valve TAVR with a 23 mm Sapien XT valve would be a reasonable alternative for this patient. He has adequate access via the left femoral artery but has an abdominal aortic stent graft extending down into the iliacs. The other option would be transapical. I had a long discussion with the patient and his son concerning the treatment options and risks. All of their questions have been answered to their satisfaction and they would like to proceed with surgery.   Plan:  Valve in valve TAVR with a 23 mm Sapien XT valve via left transfemoral route with transapical route as backup if needed.

## 2014-03-23 ENCOUNTER — Encounter (HOSPITAL_COMMUNITY): Payer: Medicare Other | Admitting: Anesthesiology

## 2014-03-23 ENCOUNTER — Encounter (HOSPITAL_COMMUNITY)
Admission: RE | Disposition: A | Discharge status: Home Health | Payer: Medicare Other | Source: Ambulatory Visit | Attending: Surgery

## 2014-03-23 ENCOUNTER — Encounter (HOSPITAL_COMMUNITY): Payer: Self-pay | Admitting: *Deleted

## 2014-03-23 ENCOUNTER — Inpatient Hospital Stay (HOSPITAL_COMMUNITY): Payer: Medicare Other | Admitting: Anesthesiology

## 2014-03-23 ENCOUNTER — Inpatient Hospital Stay (HOSPITAL_COMMUNITY)
Admission: RE | Admit: 2014-03-23 | Discharge: 2014-03-29 | DRG: 266 | Disposition: A | Discharge status: Home Health | Payer: Medicare Other | Source: Ambulatory Visit | Attending: Surgery | Admitting: Surgery

## 2014-03-23 ENCOUNTER — Inpatient Hospital Stay (HOSPITAL_COMMUNITY): Payer: Medicare Other

## 2014-03-23 DIAGNOSIS — E039 Hypothyroidism, unspecified: Secondary | ICD-10-CM | POA: Diagnosis present

## 2014-03-23 DIAGNOSIS — I679 Cerebrovascular disease, unspecified: Secondary | ICD-10-CM | POA: Diagnosis present

## 2014-03-23 DIAGNOSIS — I5033 Acute on chronic diastolic (congestive) heart failure: Secondary | ICD-10-CM | POA: Diagnosis present

## 2014-03-23 DIAGNOSIS — I2510 Atherosclerotic heart disease of native coronary artery without angina pectoris: Secondary | ICD-10-CM

## 2014-03-23 DIAGNOSIS — Z8673 Personal history of transient ischemic attack (TIA), and cerebral infarction without residual deficits: Secondary | ICD-10-CM | POA: Diagnosis not present

## 2014-03-23 DIAGNOSIS — Z7902 Long term (current) use of antithrombotics/antiplatelets: Secondary | ICD-10-CM

## 2014-03-23 DIAGNOSIS — I2582 Chronic total occlusion of coronary artery: Secondary | ICD-10-CM | POA: Diagnosis present

## 2014-03-23 DIAGNOSIS — D62 Acute posthemorrhagic anemia: Secondary | ICD-10-CM | POA: Diagnosis not present

## 2014-03-23 DIAGNOSIS — D696 Thrombocytopenia, unspecified: Secondary | ICD-10-CM | POA: Diagnosis present

## 2014-03-23 DIAGNOSIS — I509 Heart failure, unspecified: Secondary | ICD-10-CM | POA: Diagnosis present

## 2014-03-23 DIAGNOSIS — N138 Other obstructive and reflux uropathy: Secondary | ICD-10-CM | POA: Diagnosis present

## 2014-03-23 DIAGNOSIS — Z79899 Other long term (current) drug therapy: Secondary | ICD-10-CM | POA: Diagnosis not present

## 2014-03-23 DIAGNOSIS — Z8612 Personal history of poliomyelitis: Secondary | ICD-10-CM

## 2014-03-23 DIAGNOSIS — Y831 Surgical operation with implant of artificial internal device as the cause of abnormal reaction of the patient, or of later complication, without mention of misadventure at the time of the procedure: Secondary | ICD-10-CM | POA: Diagnosis present

## 2014-03-23 DIAGNOSIS — K5900 Constipation, unspecified: Secondary | ICD-10-CM | POA: Diagnosis not present

## 2014-03-23 DIAGNOSIS — E785 Hyperlipidemia, unspecified: Secondary | ICD-10-CM | POA: Diagnosis present

## 2014-03-23 DIAGNOSIS — I129 Hypertensive chronic kidney disease with stage 1 through stage 4 chronic kidney disease, or unspecified chronic kidney disease: Secondary | ICD-10-CM | POA: Diagnosis present

## 2014-03-23 DIAGNOSIS — T8209XA Other mechanical complication of heart valve prosthesis, initial encounter: Secondary | ICD-10-CM

## 2014-03-23 DIAGNOSIS — I252 Old myocardial infarction: Secondary | ICD-10-CM | POA: Diagnosis not present

## 2014-03-23 DIAGNOSIS — R339 Retention of urine, unspecified: Secondary | ICD-10-CM | POA: Diagnosis not present

## 2014-03-23 DIAGNOSIS — I351 Nonrheumatic aortic (valve) insufficiency: Secondary | ICD-10-CM

## 2014-03-23 DIAGNOSIS — T82897A Other specified complication of cardiac prosthetic devices, implants and grafts, initial encounter: Secondary | ICD-10-CM | POA: Diagnosis present

## 2014-03-23 DIAGNOSIS — N401 Enlarged prostate with lower urinary tract symptoms: Secondary | ICD-10-CM | POA: Diagnosis present

## 2014-03-23 DIAGNOSIS — Z006 Encounter for examination for normal comparison and control in clinical research program: Secondary | ICD-10-CM

## 2014-03-23 DIAGNOSIS — N183 Chronic kidney disease, stage 3 unspecified: Secondary | ICD-10-CM | POA: Diagnosis present

## 2014-03-23 DIAGNOSIS — I359 Nonrheumatic aortic valve disorder, unspecified: Secondary | ICD-10-CM | POA: Diagnosis present

## 2014-03-23 DIAGNOSIS — Z7982 Long term (current) use of aspirin: Secondary | ICD-10-CM

## 2014-03-23 DIAGNOSIS — Z952 Presence of prosthetic heart valve: Secondary | ICD-10-CM

## 2014-03-23 DIAGNOSIS — N390 Urinary tract infection, site not specified: Secondary | ICD-10-CM | POA: Diagnosis not present

## 2014-03-23 HISTORY — PX: TRANSCATHETER AORTIC VALVE REPLACEMENT, TRANSFEMORAL: SHX6400

## 2014-03-23 HISTORY — DX: Presence of prosthetic heart valve: Z95.2

## 2014-03-23 HISTORY — PX: INTRAOPERATIVE TRANSESOPHAGEAL ECHOCARDIOGRAM: SHX5062

## 2014-03-23 LAB — CBC
HCT: 36.4 % — ABNORMAL LOW (ref 39.0–52.0)
Hemoglobin: 12.3 g/dL — ABNORMAL LOW (ref 13.0–17.0)
MCH: 32.5 pg (ref 26.0–34.0)
MCHC: 33.8 g/dL (ref 30.0–36.0)
MCV: 96 fL (ref 78.0–100.0)
Platelets: 96 10*3/uL — ABNORMAL LOW (ref 150–400)
RBC: 3.79 MIL/uL — AB (ref 4.22–5.81)
RDW: 13.4 % (ref 11.5–15.5)
WBC: 6.8 10*3/uL (ref 4.0–10.5)

## 2014-03-23 LAB — POCT I-STAT 4, (NA,K, GLUC, HGB,HCT)
GLUCOSE: 126 mg/dL — AB (ref 70–99)
HCT: 38 % — ABNORMAL LOW (ref 39.0–52.0)
Hemoglobin: 12.9 g/dL — ABNORMAL LOW (ref 13.0–17.0)
Potassium: 3.6 mEq/L — ABNORMAL LOW (ref 3.7–5.3)
SODIUM: 139 meq/L (ref 137–147)

## 2014-03-23 LAB — POCT I-STAT 3, ART BLOOD GAS (G3+)
Acid-base deficit: 2 mmol/L (ref 0.0–2.0)
BICARBONATE: 23.7 meq/L (ref 20.0–24.0)
O2 Saturation: 91 %
PH ART: 7.375 (ref 7.350–7.450)
TCO2: 25 mmol/L (ref 0–100)
pCO2 arterial: 40 mmHg (ref 35.0–45.0)
pO2, Arterial: 58 mmHg — ABNORMAL LOW (ref 80.0–100.0)

## 2014-03-23 LAB — PROTIME-INR
INR: 1.25 (ref 0.00–1.49)
PROTHROMBIN TIME: 15.7 s — AB (ref 11.6–15.2)

## 2014-03-23 LAB — PREPARE RBC (CROSSMATCH)

## 2014-03-23 LAB — APTT: aPTT: 34 seconds (ref 24–37)

## 2014-03-23 SURGERY — IMPLANTATION, AORTIC VALVE, TRANSCATHETER, FEMORAL APPROACH
Anesthesia: General | Site: Chest

## 2014-03-23 MED ORDER — ACETAMINOPHEN 500 MG PO TABS
1000.0000 mg | ORAL_TABLET | Freq: Four times a day (QID) | ORAL | Status: AC
  Administered 2014-03-24 – 2014-03-28 (×8): 1000 mg via ORAL
  Filled 2014-03-23 (×19): qty 2

## 2014-03-23 MED ORDER — ACETAMINOPHEN 160 MG/5ML PO SOLN
1000.0000 mg | Freq: Four times a day (QID) | ORAL | Status: AC
  Administered 2014-03-23: 1000 mg
  Filled 2014-03-23 (×2): qty 40.6

## 2014-03-23 MED ORDER — CLOPIDOGREL BISULFATE 75 MG PO TABS
75.0000 mg | ORAL_TABLET | Freq: Every day | ORAL | Status: DC
  Administered 2014-03-24 – 2014-03-29 (×6): 75 mg via ORAL
  Filled 2014-03-23 (×7): qty 1

## 2014-03-23 MED ORDER — VANCOMYCIN HCL IN DEXTROSE 1-5 GM/200ML-% IV SOLN
1000.0000 mg | Freq: Once | INTRAVENOUS | Status: AC
  Administered 2014-03-23: 1000 mg via INTRAVENOUS
  Filled 2014-03-23 (×2): qty 200

## 2014-03-23 MED ORDER — GLYCOPYRROLATE 0.2 MG/ML IJ SOLN
INTRAMUSCULAR | Status: AC
  Filled 2014-03-23: qty 2

## 2014-03-23 MED ORDER — CHLORHEXIDINE GLUCONATE 4 % EX LIQD: 60.0000 mL | Freq: Once | CUTANEOUS | Status: DC

## 2014-03-23 MED ORDER — FENTANYL CITRATE 0.05 MG/ML IJ SOLN: 25.0000 ug | INTRAMUSCULAR | Status: DC | PRN

## 2014-03-23 MED ORDER — FENTANYL CITRATE 0.05 MG/ML IJ SOLN
INTRAMUSCULAR | Status: AC
  Administered 2014-03-23: 50 ug
  Administered 2014-03-23 (×2): 25 ug
  Filled 2014-03-23: qty 2

## 2014-03-23 MED ORDER — SODIUM CHLORIDE 0.9 % IV SOLN
1.0000 mL/kg/h | INTRAVENOUS | Status: AC
  Administered 2014-03-23: 1 mL/kg/h via INTRAVENOUS

## 2014-03-23 MED ORDER — PROTAMINE SULFATE 10 MG/ML IV SOLN
INTRAVENOUS | Status: DC | PRN
  Administered 2014-03-23 (×2): 10 mg via INTRAVENOUS
  Administered 2014-03-23 (×3): 20 mg via INTRAVENOUS

## 2014-03-23 MED ORDER — 0.9 % SODIUM CHLORIDE (POUR BTL) OPTIME
TOPICAL | Status: DC | PRN
  Administered 2014-03-23: 1000 mL

## 2014-03-23 MED ORDER — HYDRALAZINE HCL 20 MG/ML IJ SOLN: 10.0000 mg | INTRAMUSCULAR | Status: DC | PRN

## 2014-03-23 MED ORDER — TRAMADOL HCL 50 MG PO TABS
50.0000 mg | ORAL_TABLET | ORAL | Status: DC | PRN
  Administered 2014-03-24: 100 mg via ORAL
  Administered 2014-03-25: 50 mg via ORAL
  Administered 2014-03-26: 100 mg via ORAL
  Filled 2014-03-23: qty 1
  Filled 2014-03-23 (×2): qty 2

## 2014-03-23 MED ORDER — ACETAMINOPHEN 160 MG/5ML PO SOLN
650.0000 mg | Freq: Once | ORAL | Status: AC
  Administered 2014-03-23: 650 mg

## 2014-03-23 MED ORDER — INSULIN REGULAR BOLUS VIA INFUSION
0.0000 [IU] | Freq: Three times a day (TID) | INTRAVENOUS | Status: DC
  Filled 2014-03-23: qty 10

## 2014-03-23 MED ORDER — MIDAZOLAM HCL 2 MG/2ML IJ SOLN
INTRAMUSCULAR | Status: AC
  Filled 2014-03-23: qty 2

## 2014-03-23 MED ORDER — SODIUM CHLORIDE 0.9 % IR SOLN
Status: DC | PRN
  Administered 2014-03-23 (×3)

## 2014-03-23 MED ORDER — ROCURONIUM BROMIDE 50 MG/5ML IV SOLN
INTRAVENOUS | Status: AC
  Filled 2014-03-23: qty 1

## 2014-03-23 MED ORDER — LACTATED RINGERS IV SOLN: 500.0000 mL | Freq: Once | INTRAVENOUS | Status: AC | PRN

## 2014-03-23 MED ORDER — METOPROLOL TARTRATE 12.5 MG HALF TABLET
12.5000 mg | ORAL_TABLET | Freq: Once | ORAL | Status: AC
  Administered 2014-03-23: 12.5 mg via ORAL

## 2014-03-23 MED ORDER — NITROGLYCERIN IN D5W 200-5 MCG/ML-% IV SOLN
0.0000 ug/min | INTRAVENOUS | Status: DC
  Administered 2014-03-23: 10 ug/min via INTRAVENOUS

## 2014-03-23 MED ORDER — FENTANYL CITRATE 0.05 MG/ML IJ SOLN
INTRAMUSCULAR | Status: AC
  Filled 2014-03-23: qty 5

## 2014-03-23 MED ORDER — ALBUMIN HUMAN 5 % IV SOLN
250.0000 mL | INTRAVENOUS | Status: DC | PRN
  Filled 2014-03-23: qty 250

## 2014-03-23 MED ORDER — ONDANSETRON HCL 4 MG/2ML IJ SOLN
4.0000 mg | Freq: Four times a day (QID) | INTRAMUSCULAR | Status: DC | PRN
Start: 2014-03-23 — End: 2014-03-29

## 2014-03-23 MED ORDER — PROPOFOL 10 MG/ML IV BOLUS
INTRAVENOUS | Status: AC
  Filled 2014-03-23: qty 20

## 2014-03-23 MED ORDER — ACETAMINOPHEN 650 MG RE SUPP: 650.0000 mg | Freq: Once | RECTAL | Status: AC

## 2014-03-23 MED ORDER — EPHEDRINE SULFATE 50 MG/ML IJ SOLN
INTRAMUSCULAR | Status: DC | PRN
  Administered 2014-03-23: 5 mg via INTRAVENOUS

## 2014-03-23 MED ORDER — ONDANSETRON HCL 4 MG/2ML IJ SOLN
INTRAMUSCULAR | Status: DC | PRN
  Administered 2014-03-23: 4 mg via INTRAVENOUS

## 2014-03-23 MED ORDER — SODIUM CHLORIDE 0.9 % IJ SOLN: 3.0000 mL | Freq: Two times a day (BID) | INTRAMUSCULAR | Status: DC

## 2014-03-23 MED ORDER — PANTOPRAZOLE SODIUM 40 MG PO TBEC
40.0000 mg | DELAYED_RELEASE_TABLET | Freq: Every day | ORAL | Status: DC
  Administered 2014-03-25 – 2014-03-29 (×5): 40 mg via ORAL
  Filled 2014-03-23 (×4): qty 1

## 2014-03-23 MED ORDER — ATORVASTATIN CALCIUM 80 MG PO TABS
80.0000 mg | ORAL_TABLET | Freq: Every day | ORAL | Status: DC
  Administered 2014-03-24 – 2014-03-28 (×5): 80 mg via ORAL
  Filled 2014-03-23 (×7): qty 1

## 2014-03-23 MED ORDER — FINASTERIDE 5 MG PO TABS
5.0000 mg | ORAL_TABLET | Freq: Every day | ORAL | Status: DC
  Administered 2014-03-24 – 2014-03-29 (×6): 5 mg via ORAL
  Filled 2014-03-23 (×7): qty 1

## 2014-03-23 MED ORDER — POTASSIUM CHLORIDE 10 MEQ/50ML IV SOLN
10.0000 meq | INTRAVENOUS | Status: AC
Start: 2014-03-23 — End: 2014-03-23
  Administered 2014-03-23 (×3): 10 meq via INTRAVENOUS

## 2014-03-23 MED ORDER — HEPARIN SODIUM (PORCINE) 1000 UNIT/ML IJ SOLN
INTRAMUSCULAR | Status: AC
  Filled 2014-03-23: qty 1

## 2014-03-23 MED ORDER — METOPROLOL TARTRATE 1 MG/ML IV SOLN: 2.5000 mg | INTRAVENOUS | Status: DC | PRN

## 2014-03-23 MED ORDER — HEPARIN SODIUM (PORCINE) 1000 UNIT/ML IJ SOLN
INTRAMUSCULAR | Status: DC | PRN
  Administered 2014-03-23: 8000 [IU] via INTRAVENOUS

## 2014-03-23 MED ORDER — NEOSTIGMINE METHYLSULFATE 10 MG/10ML IV SOLN
INTRAVENOUS | Status: AC
  Filled 2014-03-23: qty 1

## 2014-03-23 MED ORDER — LEVOFLOXACIN IN D5W 750 MG/150ML IV SOLN
750.0000 mg | INTRAVENOUS | Status: AC
  Administered 2014-03-24: 750 mg via INTRAVENOUS
  Filled 2014-03-23: qty 150

## 2014-03-23 MED ORDER — LEVOTHYROXINE SODIUM 50 MCG PO TABS
50.0000 ug | ORAL_TABLET | Freq: Every day | ORAL | Status: DC
  Administered 2014-03-24 – 2014-03-29 (×6): 50 ug via ORAL
  Filled 2014-03-23 (×7): qty 1

## 2014-03-23 MED ORDER — ONDANSETRON HCL 4 MG/2ML IJ SOLN
INTRAMUSCULAR | Status: AC
  Filled 2014-03-23: qty 2

## 2014-03-23 MED ORDER — VANCOMYCIN HCL 1000 MG IV SOLR
1000.0000 mg | INTRAVENOUS | Status: DC | PRN
  Administered 2014-03-23: 1250 mg via INTRAVENOUS

## 2014-03-23 MED ORDER — SODIUM CHLORIDE 0.9 % IJ SOLN: 3.0000 mL | INTRAMUSCULAR | Status: DC | PRN

## 2014-03-23 MED ORDER — LIDOCAINE HCL (CARDIAC) 20 MG/ML IV SOLN
INTRAVENOUS | Status: AC
  Filled 2014-03-23: qty 5

## 2014-03-23 MED ORDER — METOPROLOL TARTRATE 25 MG/10 ML ORAL SUSPENSION
12.5000 mg | Freq: Two times a day (BID) | ORAL | Status: DC
  Filled 2014-03-23 (×3): qty 5

## 2014-03-23 MED ORDER — CHLORHEXIDINE GLUCONATE 4 % EX LIQD: 30.0000 mL | CUTANEOUS | Status: DC

## 2014-03-23 MED ORDER — DEXMEDETOMIDINE HCL IN NACL 200 MCG/50ML IV SOLN: 0.1000 ug/kg/h | INTRAVENOUS | Status: DC

## 2014-03-23 MED ORDER — ARTIFICIAL TEARS OP OINT
TOPICAL_OINTMENT | OPHTHALMIC | Status: AC
  Filled 2014-03-23: qty 3.5

## 2014-03-23 MED ORDER — PROPOFOL 10 MG/ML IV BOLUS
INTRAVENOUS | Status: DC | PRN
  Administered 2014-03-23 (×2): 30 mg via INTRAVENOUS
  Administered 2014-03-23: 50 mg via INTRAVENOUS

## 2014-03-23 MED ORDER — SODIUM CHLORIDE 0.9 % IV SOLN: 250.0000 mL | INTRAVENOUS | Status: DC | PRN

## 2014-03-23 MED ORDER — LACTATED RINGERS IV SOLN
INTRAVENOUS | Status: DC | PRN
  Administered 2014-03-23 (×4): via INTRAVENOUS

## 2014-03-23 MED ORDER — FAMOTIDINE IN NACL 20-0.9 MG/50ML-% IV SOLN
20.0000 mg | Freq: Two times a day (BID) | INTRAVENOUS | Status: DC
  Administered 2014-03-23: 20 mg via INTRAVENOUS

## 2014-03-23 MED ORDER — SODIUM CHLORIDE 0.9 % IV SOLN
INTRAVENOUS | Status: DC
  Filled 2014-03-23: qty 2.5

## 2014-03-23 MED ORDER — ARTIFICIAL TEARS OP OINT
TOPICAL_OINTMENT | OPHTHALMIC | Status: DC | PRN
  Administered 2014-03-23: 1 via OPHTHALMIC

## 2014-03-23 MED ORDER — ROCURONIUM BROMIDE 100 MG/10ML IV SOLN
INTRAVENOUS | Status: DC | PRN
  Administered 2014-03-23: 40 mg via INTRAVENOUS
  Administered 2014-03-23: 10 mg via INTRAVENOUS

## 2014-03-23 MED ORDER — IODIXANOL 320 MG/ML IV SOLN
INTRAVENOUS | Status: DC | PRN
  Administered 2014-03-23: 20.3 mL via INTRAVENOUS

## 2014-03-23 MED ORDER — GLYCOPYRROLATE 0.2 MG/ML IJ SOLN
INTRAMUSCULAR | Status: DC | PRN
  Administered 2014-03-23: 0.4 mg via INTRAVENOUS

## 2014-03-23 MED ORDER — ASPIRIN 81 MG PO CHEW
81.0000 mg | CHEWABLE_TABLET | Freq: Every day | ORAL | Status: DC
  Administered 2014-03-24 – 2014-03-29 (×6): 81 mg via ORAL
  Filled 2014-03-23 (×6): qty 1

## 2014-03-23 MED ORDER — LEVOFLOXACIN IN D5W 500 MG/100ML IV SOLN
INTRAVENOUS | Status: DC | PRN
  Administered 2014-03-23: 500 mg via INTRAVENOUS

## 2014-03-23 MED ORDER — PHENYLEPHRINE HCL 10 MG/ML IJ SOLN
0.0000 ug/min | INTRAVENOUS | Status: DC
  Filled 2014-03-23: qty 2

## 2014-03-23 MED ORDER — PAROXETINE HCL 10 MG PO TABS
10.0000 mg | ORAL_TABLET | Freq: Every day | ORAL | Status: DC
  Administered 2014-03-24 – 2014-03-29 (×6): 10 mg via ORAL
  Filled 2014-03-23 (×7): qty 1

## 2014-03-23 MED ORDER — CETYLPYRIDINIUM CHLORIDE 0.05 % MT LIQD
7.0000 mL | Freq: Two times a day (BID) | OROMUCOSAL | Status: DC
  Administered 2014-03-23 – 2014-03-29 (×12): 7 mL via OROMUCOSAL

## 2014-03-23 MED ORDER — NEOSTIGMINE METHYLSULFATE 10 MG/10ML IV SOLN
INTRAVENOUS | Status: DC | PRN
  Administered 2014-03-23: 3 mg via INTRAVENOUS

## 2014-03-23 MED ORDER — METOPROLOL TARTRATE 12.5 MG HALF TABLET
12.5000 mg | ORAL_TABLET | Freq: Two times a day (BID) | ORAL | Status: DC
  Administered 2014-03-24 – 2014-03-29 (×11): 12.5 mg via ORAL
  Filled 2014-03-23 (×13): qty 1

## 2014-03-23 MED ORDER — FENTANYL CITRATE 0.05 MG/ML IJ SOLN
INTRAMUSCULAR | Status: DC | PRN
  Administered 2014-03-23: 50 ug via INTRAVENOUS
  Administered 2014-03-23: 100 ug via INTRAVENOUS

## 2014-03-23 SURGICAL SUPPLY — 124 items
ADAPTER CARDIOPLEGIA (MISCELLANEOUS) IMPLANT
ANTEGRADE CPLG (MISCELLANEOUS) IMPLANT
ATTRACTOMAT 16X20 MAGNETIC DRP (DRAPES) IMPLANT
BAG BANDED W/RUBBER/TAPE 36X54 (MISCELLANEOUS) ×3 IMPLANT
BAG DECANTER FOR FLEXI CONT (MISCELLANEOUS) ×3 IMPLANT
BAG SNAP BAND KOVER 36X36 (MISCELLANEOUS) ×6 IMPLANT
BLADE 10 SAFETY STRL DISP (BLADE) IMPLANT
BLADE OSC/SAG .038X5.5 CUT EDG (BLADE) ×3 IMPLANT
BLADE SURG ROTATE 9660 (MISCELLANEOUS) IMPLANT
CABLE PACING FASLOC BIEGE (MISCELLANEOUS) ×3 IMPLANT
CABLE PACING FASLOC BLUE (MISCELLANEOUS) ×3 IMPLANT
CANISTER SUCTION 2500CC (MISCELLANEOUS) IMPLANT
CANNULA FEM VENOUS REMOTE 22FR (CANNULA) IMPLANT
CANNULA FEMORAL ART 14 SM (MISCELLANEOUS) IMPLANT
CANNULA GUNDRY RCSP 15FR (MISCELLANEOUS) IMPLANT
CANNULA OPTISITE PERFUSION 16F (CANNULA) IMPLANT
CANNULA OPTISITE PERFUSION 18F (CANNULA) IMPLANT
CANNULA SOFTFLOW AORTIC 7M21FR (CANNULA) IMPLANT
CANNULA VENOUS LOW PROF 34X46 (CANNULA) IMPLANT
CATH BEACON 5.038 65CM KMP-01 (CATHETERS) ×3 IMPLANT
CATH DIAG EXPO 6F AL2 (CATHETERS) ×3 IMPLANT
CATH DIAG EXPO 6F VENT PIG 145 (CATHETERS) ×6 IMPLANT
CATH HEART VENT LEFT (CATHETERS) IMPLANT
CATH S G BIP PACING (SET/KITS/TRAYS/PACK) ×6 IMPLANT
CATH SOFT-VU 4F 65 STRAIGHT (CATHETERS) ×1 IMPLANT
CATH SOFT-VU STRAIGHT 4F 65CM (CATHETERS) ×2
CLIP TI MEDIUM 24 (CLIP) ×3 IMPLANT
CLIP TI WIDE RED SMALL 24 (CLIP) ×3 IMPLANT
CONN ST 1/4X3/8  BEN (MISCELLANEOUS)
CONN ST 1/4X3/8 BEN (MISCELLANEOUS) IMPLANT
CONNECTOR 1/2X3/8X1/2 3 WAY (MISCELLANEOUS)
CONNECTOR 1/2X3/8X1/2 3WAY (MISCELLANEOUS) IMPLANT
COVER DOME SNAP 22 D (MISCELLANEOUS) ×3 IMPLANT
COVER MAYO STAND STRL (DRAPES) ×3 IMPLANT
COVER PROBE W GEL 5X96 (DRAPES) IMPLANT
COVER SURGICAL LIGHT HANDLE (MISCELLANEOUS) ×3 IMPLANT
COVER TABLE BACK 60X90 (DRAPES) ×3 IMPLANT
CRADLE DONUT ADULT HEAD (MISCELLANEOUS) ×3 IMPLANT
DERMABOND ADHESIVE PROPEN (GAUZE/BANDAGES/DRESSINGS) ×2
DERMABOND ADVANCED (GAUZE/BANDAGES/DRESSINGS) ×2
DERMABOND ADVANCED .7 DNX12 (GAUZE/BANDAGES/DRESSINGS) ×1 IMPLANT
DERMABOND ADVANCED .7 DNX6 (GAUZE/BANDAGES/DRESSINGS) ×1 IMPLANT
DRAIN CHANNEL 28F RND 3/8 FF (WOUND CARE) IMPLANT
DRAIN CHANNEL 32F RND 10.7 FF (WOUND CARE) IMPLANT
DRAPE INCISE IOBAN 66X45 STRL (DRAPES) ×3 IMPLANT
DRAPE SLUSH/WARMER DISC (DRAPES) IMPLANT
DRAPE TABLE COVER HEAVY DUTY (DRAPES) ×3 IMPLANT
DRSG TEGADERM 4X4.75 (GAUZE/BANDAGES/DRESSINGS) ×3 IMPLANT
ELECT REM PT RETURN 9FT ADLT (ELECTROSURGICAL) ×6
ELECTRODE REM PT RTRN 9FT ADLT (ELECTROSURGICAL) ×2 IMPLANT
FELT TEFLON 6X6 (MISCELLANEOUS) IMPLANT
FEMORAL VENOUS CANN RAP (CANNULA) IMPLANT
GAUZE SPONGE 4X4 12PLY STRL (GAUZE/BANDAGES/DRESSINGS) ×3 IMPLANT
GLOVE ECLIPSE 7.5 STRL STRAW (GLOVE) ×6 IMPLANT
GLOVE ECLIPSE 8.0 STRL XLNG CF (GLOVE) ×6 IMPLANT
GLOVE EUDERMIC 7 POWDERFREE (GLOVE) ×6 IMPLANT
GLOVE ORTHO TXT STRL SZ7.5 (GLOVE) ×6 IMPLANT
GOWN STRL REUS W/ TWL LRG LVL3 (GOWN DISPOSABLE) ×3 IMPLANT
GOWN STRL REUS W/ TWL XL LVL3 (GOWN DISPOSABLE) ×5 IMPLANT
GOWN STRL REUS W/TWL LRG LVL3 (GOWN DISPOSABLE) ×6
GOWN STRL REUS W/TWL XL LVL3 (GOWN DISPOSABLE) ×10
GUIDEWIRE ANGLED .035X150CM (WIRE) ×3 IMPLANT
GUIDEWIRE SAF TJ AMPL .035X180 (WIRE) IMPLANT
GUIDEWIRE SAFE TJ AMPLATZ EXST (WIRE) ×3 IMPLANT
GUIDEWIRE STRAIGHT .035 260CM (WIRE) ×3 IMPLANT
GUIDEWIRE WHOLEY .035 145 JTIP (WIRE) ×6 IMPLANT
HEMOSTAT POWDER SURGIFOAM 1G (HEMOSTASIS) IMPLANT
INSERT FOGARTY 61MM (MISCELLANEOUS) IMPLANT
INSERT FOGARTY XLG (MISCELLANEOUS) IMPLANT
KIT BASIN OR (CUSTOM PROCEDURE TRAY) ×3 IMPLANT
KIT DILATOR VASC 18G NDL (KITS) IMPLANT
KIT HEART LEFT (KITS) ×3 IMPLANT
KIT ROOM TURNOVER OR (KITS) ×3 IMPLANT
KIT SUCTION CATH 14FR (SUCTIONS) ×6 IMPLANT
LEAD PACING MYOCARDI (MISCELLANEOUS) IMPLANT
NEEDLE PERC 18GX7CM (NEEDLE) ×3 IMPLANT
NS IRRIG 1000ML POUR BTL (IV SOLUTION) ×9 IMPLANT
PACK AORTA (CUSTOM PROCEDURE TRAY) ×3 IMPLANT
PAD ARMBOARD 7.5X6 YLW CONV (MISCELLANEOUS) ×6 IMPLANT
PAD ELECT DEFIB RADIOL ZOLL (MISCELLANEOUS) ×3 IMPLANT
PATCH TACHOSII LRG 9.5X4.8 (VASCULAR PRODUCTS) IMPLANT
SET CANNULATION TOURNIQUET (MISCELLANEOUS) IMPLANT
SHEATH PINNACLE 6F 10CM (SHEATH) ×6 IMPLANT
SPONGE LAP 4X18 X RAY DECT (DISPOSABLE) ×3 IMPLANT
STOPCOCK MORSE 400PSI 3WAY (MISCELLANEOUS) ×6 IMPLANT
SUT ETHIBOND 2 0 SH (SUTURE) ×2
SUT ETHIBOND 2 0 SH 36X2 (SUTURE) ×1 IMPLANT
SUT ETHIBOND X763 2 0 SH 1 (SUTURE) IMPLANT
SUT GORETEX CV 4 TH 22 36 (SUTURE) ×3 IMPLANT
SUT GORETEX CV4 TH-18 (SUTURE) ×6 IMPLANT
SUT MNCRL AB 3-0 PS2 18 (SUTURE) IMPLANT
SUT PDS AB 1 CTX 36 (SUTURE) IMPLANT
SUT PROLENE 2 0 MH 48 (SUTURE) IMPLANT
SUT PROLENE 3 0 SH1 36 (SUTURE) IMPLANT
SUT PROLENE 4 0 RB 1 (SUTURE) ×2
SUT PROLENE 4-0 RB1 .5 CRCL 36 (SUTURE) ×1 IMPLANT
SUT PROLENE 5 0 C 1 36 (SUTURE) ×6 IMPLANT
SUT PROLENE 6 0 C 1 30 (SUTURE) ×6 IMPLANT
SUT SILK  1 MH (SUTURE) ×2
SUT SILK 1 MH (SUTURE) ×1 IMPLANT
SUT SILK 2 0 SH CR/8 (SUTURE) IMPLANT
SUT TEM PAC WIRE 2 0 SH (SUTURE) IMPLANT
SUT VIC AB 2-0 CT1 36 (SUTURE) ×6 IMPLANT
SUT VIC AB 2-0 CTX 36 (SUTURE) IMPLANT
SUT VIC AB 3-0 SH 8-18 (SUTURE) IMPLANT
SUT VIC AB 3-0 X1 27 (SUTURE) ×3 IMPLANT
SYR 30ML LL (SYRINGE) ×6 IMPLANT
SYR 50ML LL SCALE MARK (SYRINGE) ×3 IMPLANT
SYRINGE 10CC LL (SYRINGE) ×9 IMPLANT
SYSTEM SAHARA CHEST DRAIN ATS (WOUND CARE) ×3 IMPLANT
TAPE CLOTH SURG 4X10 WHT LF (GAUZE/BANDAGES/DRESSINGS) ×3 IMPLANT
TOWEL OR 17X24 6PK STRL BLUE (TOWEL DISPOSABLE) ×9 IMPLANT
TOWEL OR 17X26 10 PK STRL BLUE (TOWEL DISPOSABLE) ×6 IMPLANT
TRANSDUCER W/STOPCOCK (MISCELLANEOUS) ×6 IMPLANT
TRAY FOLEY IC TEMP SENS 14FR (CATHETERS) ×3 IMPLANT
TUBE SUCT INTRACARD DLP 20F (MISCELLANEOUS) IMPLANT
TUBING ART PRESS 48 MALE/FEM (TUBING) ×3 IMPLANT
TUBING HIGH PRESSURE 120CM (CONNECTOR) ×3 IMPLANT
UNDERPAD 30X30 INCONTINENT (UNDERPADS AND DIAPERS) IMPLANT
VALVE HRT TRANSCATH NOVAFL 23M (Valve) ×3 IMPLANT
VENT LEFT HEART 12002 (CATHETERS)
WATER STERILE IRR 1000ML POUR (IV SOLUTION) IMPLANT
WIRE .035 3MM-J 145CM (WIRE) ×3 IMPLANT
WIRE AMPLATZ SS-J .035X180CM (WIRE) ×3 IMPLANT

## 2014-03-23 NOTE — Progress Notes (Signed)
1640: Patient c/o headache (4-5/10) after starting Nitro Drip, patient given APAP PO suspension for HA, nitro was on 72mcg at this time  Now patient states HA pain is 3-4/10, paged MD since nitro was increased 38mcg to keep SBP Less 150.  MD on call returned page, given update, received orders, will monitor

## 2014-03-23 NOTE — Transfer of Care (Signed)
Immediate Anesthesia Transfer of Care Note  Patient: [name] [name]  Procedure(s) Performed: Procedure(s) with comments: TRANSCATHETER AORTIC VALVE REPLACEMENT, TRANSFEMORAL (N/A) - valve in valve TAVR INTRAOPERATIVE TRANSESOPHAGEAL ECHOCARDIOGRAM (N/A)  Patient Location: SICU  Anesthesia Type:General  Level of Consciousness: awake, alert  and oriented  Airway & Oxygen Therapy: Patient Spontanous Breathing and Patient connected to nasal cannula oxygen  Post-op Assessment: Report given to PACU RN  Post vital signs: Reviewed and stable  Complications: No apparent anesthesia complications

## 2014-03-23 NOTE — Anesthesia Preprocedure Evaluation (Addendum)
Anesthesia Evaluation  Patient identified by MRN, date of birth, ID band Patient awake    Reviewed: Allergy & Precautions, H&P , NPO status , Patient's Chart, lab work & pertinent test results  History of Anesthesia Complications Negative for: history of anesthetic complications  Airway Mallampati: II TM Distance: >3 FB Neck ROM: Full    Dental  (+) Teeth Intact, Dental Advisory Given   Pulmonary shortness of breath and with exertion, neg sleep apnea, neg COPDformer smoker,  breath sounds clear to auscultation        Cardiovascular hypertension, Pt. on medications + CAD, + Past MI, + Cardiac Stents and + Peripheral Vascular Disease - dysrhythmias + Valvular Problems/Murmurs AI Rhythm:Regular Rate:Normal + Systolic murmurs    Neuro/Psych PSYCHIATRIC DISORDERS Depression CVA, No Residual Symptoms    GI/Hepatic negative GI ROS, Neg liver ROS,   Endo/Other  Hypothyroidism Morbid obesity  Renal/GU Renal InsufficiencyRenal disease     Musculoskeletal  (+) Arthritis -, Osteoarthritis,    Abdominal   Peds  Hematology   Anesthesia Other Findings   Reproductive/Obstetrics                          Anesthesia Physical Anesthesia Plan  ASA: IV  Anesthesia Plan: General   Post-op Pain Management:    Induction: Intravenous  Airway Management Planned: Oral ETT  Additional Equipment: Arterial line, CVP, PA Cath and Ultrasound Guidance Line Placement  Intra-op Plan:   Post-operative Plan: Extubation in OR and Possible Post-op intubation/ventilation  Informed Consent: I have reviewed the patients History and Physical, chart, labs and discussed the procedure including the risks, benefits and alternatives for the proposed anesthesia with the patient or authorized representative who has indicated his/her understanding and acceptance.   Dental advisory given  Plan Discussed with: CRNA and  Surgeon  Anesthesia Plan Comments:         Anesthesia Quick Evaluation

## 2014-03-23 NOTE — Anesthesia Procedure Notes (Addendum)
Procedure Name: Intubation Date/Time: 03/23/2014 12:12 PM Performed by: Sampson Si E Pre-anesthesia Checklist: Patient identified, Emergency Drugs available, Suction available, Patient being monitored and Timeout performed Patient Re-evaluated:Patient Re-evaluated prior to inductionOxygen Delivery Method: Circle system utilized Preoxygenation: Pre-oxygenation with 100% oxygen Intubation Type: IV induction Ventilation: Mask ventilation without difficulty Laryngoscope Size: Mac and 4 Grade View: Grade I Tube type: Oral Number of attempts: 2 Airway Equipment and Method: Bougie stylet Placement Confirmation: ETT inserted through vocal cords under direct vision,  positive ETCO2 and breath sounds checked- equal and bilateral Secured at: 23 cm Tube secured with: Tape Dental Injury: Teeth and Oropharynx as per pre-operative assessment  Difficulty Due To: Difficulty was unanticipated and Difficult Airway- due to anterior larynx Future Recommendations: Recommend- induction with short-acting agent, and alternative techniques readily available

## 2014-03-23 NOTE — Op Note (Signed)
[facility]  TAVR OPERATIVE NOTE   Date of Procedure:  03/23/2014  Preoperative Diagnosis: Severe Aortic Stenosis   Postoperative Diagnosis: Same   Procedure:    Transcatheter Aortic Valve Replacement - Transfemoral Approach  Edwards Sapien XT THV (size 23 mm, model # 9300TFX, serial # [id number])   Co-Surgeons: Gilford Raid, MD and Sherren Mocha, MD  Assistants: Valentina Gu. Roxy Manns, MD and Lauree Chandler, MD  Anesthesiologist:    Oleta Mouse, MD  Echocardiographer: Jenkins Rouge, MD  Pre-operative Echo Findings:  Severe prosthetic AI and mild AS  normal left ventricular systolic function  Mild MR  Post-operative Echo Findings:  No paravalvular leak  Normal left ventricular systolic function  No change in mild MR  BRIEF CLINICAL NOTE AND INDICATIONS FOR SURGERY  Patient is an 80 year old retired white male from [place] with long-standing history of aortic valve disease, coronary artery disease, hypertension, hyperlipidemia, cerebrovascular disease with previous stroke, and abdominal aortic aneurysm whose cardiac history dates back to 1997 when he suffered an inferior wall myocardial infarction. He was treated with angioplasty and stent placement in the left circumflex coronary artery. He was noted to have small chronically occluded right coronary artery as well. Several years later he underwent aortic valve replacement by Dr. Cyndia Bent in 2003 using an [place] bovine bioprosthetic tissue valve for severe symptomatic aortic stenosis. At that time the stent in the left circumflex coronary artery remained widely patent and there has not been significant progression of coronary artery disease. The patient recovered from the surgery uneventfully and has done well from a cardiovascular standpoint until more recently. Followup echocardiogram performed in October 2014 demonstrated the presence of moderate central aortic insufficiency related to  prosthetic valve dysfunction. Left ventricular function was normal and the patient was followed clinically. In January the patient presented with worsening symptoms of congestive heart failure. He was treated medically with some degree of success, but followup transesophageal echocardiogram performed in February confirmed the presence of significant prosthetic valve dysfunction with at least moderate central aortic insufficiency as well as 2 small jets what was felt to be perivalvular leak. The patient was referred to Dr. Cyndia Bent in February to consider redo aortic valve replacement. Because of the patient's advanced age and numerous comorbid medical problems, he was felt to be relatively high-risk and medical therapy was recommended. Initially the patient did reasonably well, but over the past six months he has experienced continued progression of symptoms of chronic diastolic congestive heart failure. He was seen in followup by Dr. Burt Knack in July after having repeat transthoracic echocardiogram performed in June. Echocardiogram revealed slight decrease in left ventricular systolic function with ejection fraction estimated 50-55%. Subsequently cardiac catheterization was performed. This revealed stable nonobstructive coronary artery disease involving the left coronary system with continued patency of the stent in the left circumflex coronary artery and stable chronic high-grade stenosis of the small right coronary artery. After review of treatment options and extensive discussion amongst members of the Multidisciplinary Heart Valve Team, we have elected to proceed with TAVR for treatment of this patient's severe prosthetic AI considering his very high risk for conventional surgery.  During the course of the patient's preoperative work up they have been evaluated comprehensively by a multidisciplinary team of specialists coordinated through the [facility] in the [facility].  They have been demonstrated to suffer from symptomatic severe aortic stenosis as noted above. The patient has been counseled extensively as to the relative  risks and benefits of all options for the treatment of severe aortic stenosis including long term medical therapy, conventional surgery for aortic valve replacement, and transcatheter aortic valve replacement.  The patient has been independently evaluated by two cardiac surgeons including Dr Roxy Manns and Dr. Cyndia Bent, and they are felt to be at high risk for conventional surgical aortic valve replacement based upon a predicted risk of mortality using the Society of Thor[employer] Surgeons risk calculator of 7.6%. Both surgeons indicated the patient would be a poor candidate for conventional surgery (predicted risk of mortality >15% and/or predicted risk of permanent morbidity >50%) because of comorbidities including advanced age and previous cardiac surgery.   Based upon review of all of the patient's preoperative diagnostic tests they are felt to be candidate for transcatheter aortic valve replacement using the transfemoral approach as an alternative to high risk conventional surgery.    Following the decision to proceed with transcatheter aortic valve replacement, a discussion has been held regarding what types of management strategies would be attempted intraoperatively in the event of life-threatening complications, including whether or not the patient would be considered a candidate for the use of cardiopulmonary bypass and/or conversion to open sternotomy for attempted surgical intervention.  The patient has been advised of a variety of complications that might develop peculiar to this approach including but not limited to risks of death, stroke, paravalvular leak, aortic dissection or other major vascular complications, aortic annulus rupture, device embolization, cardiac rupture or perforation, acute myocardial infarction, arrhythmia, heart block  or bradycardia requiring permanent pacemaker placement, congestive heart failure, respiratory failure, renal failure, pneumonia, infection, other late complications related to structural valve deterioration or migration, or other complications that might ultimately cause a temporary or permanent loss of functional independence or other long term morbidity.  The patient provides full informed consent for the procedure as described and all questions were answered preoperatively.    DETAILS OF THE OPERATIVE PROCEDURE  PREPARATION:    The patient is brought to the operating room on the above mentioned date and central monitoring was established by the anesthesia team including placement of Swan-Ganz catheter and radial arterial line. The patient is placed in the supine position on the operating table.  Intravenous antibiotics are administered. General endotracheal anesthesia is induced uneventfully. A Foley catheter is placed.  Baseline transesophageal echocardiogram was performed. The patient's chest, abdomen, both groins, and both lower extremities are prepared and draped in a sterile manner. A time out procedure is performed.   PERIPHERAL ACCESS:    Using the modified Seldinger technique, femoral arterial and venous access was obtained with placement of 6 Fr sheaths on the right side.   The right iliofemoral system is very tortuous. We were unable to advance a guidewire beyond the external iliac artery. Angiography was performed and we attempted to use a directional catheter to guide a glidewire into the aorta. This was also unsuccessful. Further angiography from the RFA sheath demonstrated a non flow-limiting dissection. We decided to proceed without further attempts at passing a pigtail catheter into the aorta. A temporary transvenous pacemaker catheter was passed through the right femoral venous sheath under fluoroscopic guidance into the right ventricle.  The pacemaker was tested to ensure stable  lead placement and pacemaker capture. Aortic root angiography was performed in order to determine the optimal angiographic angle for valve deployment.   TRANSFEMORAL ACCESS:   A left femoral arterial cutdown was performed by Dr Cyndia Bent. Please see his separate operative note for details. The patient  was heparinized systemically and ACT verified > 250 seconds.    A 16 Fr Fr transfemoral sheath was introduced into the left femoral artery after progressively dilating over an Amplatz superstiff wire. Caution was taken considering the patient's previous EVAR. A pigtail catheter and j-wire were directed across the bioprosthetic valve into the left ventricle. Simultaneous LV and Ao pressures were recorded.  The pigtail catheter was then exchanged for an Amplatz Extra-stiff wire in the LV apex.     TRANSCATHETER HEART VALVE DEPLOYMENT:  An Edwards Sapien XT THV (size 23 mm) was prepared and crimped per manufacturer's guidelines, and the proper orientation of the valve is confirmed on the [employer] delivery system. The valve was advanced through the introducer sheath using normal technique until in an appropriate position in the abdominal aorta beyond the sheath tip. The balloon was then retracted and using the fine-tuning wheel was centered on the valve. The valve was then advanced across the aortic arch using appropriate flexion of the catheter. The valve was carefully positioned across the aortic valve annulus. The Novaflex catheter was retracted using normal technique. The valve was positioned using the anatomic landmarks from the stented Perimount valve. Once final position of the valve has been confirmed, the valve is deployed while temporarily holding ventilation and during rapid ventricular pacing to maintain systolic blood pressure < 50 mmHg and pulse pressure < 10 mmHg. The balloon inflation is held for >3 seconds after reaching full deployment volume. Once the balloon has fully deflated the  balloon is retracted into the ascending aorta and valve function is assessed using TEE. There is felt to be no paravalvular leak and no central aortic insufficiency.  The patient's hemodynamic recovery following valve deployment is good.  The deployment balloon and guidewire are both removed. Echo demostrated acceptable post-procedural gradients, stable mitral valve function, and no AI.   PROCEDURE COMPLETION:  The sheath was then removed and arteriotomy repaired by Dr Cyndia Bent. Please see his separate report for details. Protamine was administered once femoral arterial repair was complete. The temporary pacemaker, pigtail catheters and femoral sheaths were removed with manual pressure used for hemostasis.   The patient tolerated the procedure well and is transported to the surgical intensive care in stable condition. There were no immediate intraoperative complications. All sponge instrument and needle counts are verified correct at completion of the operation.   No blood products were administered during the operation.  The patient received a total of 20 mL of intravenous contrast during the procedure.  XRay Exposure: 2924 mGy  Sherren Mocha 03/23/2014 4:01 PM

## 2014-03-23 NOTE — Progress Notes (Signed)
Echocardiogram Echocardiogram Transesophageal has been performed.  [name] [name] 03/23/2014, 12:34 PM

## 2014-03-23 NOTE — Op Note (Signed)
CARDIOTHORACIC SURGERY OPERATIVE NOTE  Date of Procedure:  03/23/2014  Preoperative Diagnosis: Severe Aortic Insufficiency s/p Perimount AVR in 2003  Postoperative Diagnosis: Same   Procedure:    Transcatheter Aortic Valve Replacement - Left Transfemoral Approach  Edwards Sapien XT (size 23 mm, model # 9300TFX, serial # [id number])   Co-Surgeons:  Gaye Pollack, MD and Sherren Mocha, MD  Assistants:   Valentina Gu. Roxy Manns, MD and Lauree Chandler, MD  Anesthesiologist:  Laurie Panda, MD  Echocardiographer:  Jenkins Rouge, MD  Pre-operative Echo Findings:  mild severe aortic stenosis  Normal left ventricular systolic function  Severe aortic insufficiency  Mild MR  Post-operative Echo Findings:  no paravalvular leak  normal left ventricular systolic function  Mild MR (unchanged)  No aortic stenosis     DETAILS OF THE OPERATIVE PROCEDURE  The majority of the procedure is documented separately in a procedure note by Dr. Burt Knack.   TRANSFEMORAL ACCESS:   A small incision is made in the left groin immediately over the common femoral artery. The subcutaneous tissues are divided with electrocautery and the anterior surface of the common femoral artery is identified. Sharp dissection is utilized to free up the artery proximally and distally and the vessel is encircled with a vessel loop.  A pair of CV-4 Gore-tex sutures are place as diamond-shaped purse-strings on the anterior surface of the femoral artery.  The patient is heparinized systemically and ACT verified > 250 seconds.  The common femoral artery is punctured using an  18 gauge needle and a soft J-tipped guidewire is passed into the common iliac artery under fluoroscopic guidance.  A 6 Fr straight diagnostic catheter is placed over the guidewire and the guidewire is removed.  An Amplatz super stiff guidewire is passed through the sheath into the descending thoracic aorta and the introducing diagnostic catheter is  removed.  A 16 F dilator was passed over the guidewire under continuous fluoroscopic guidance, making certain that  dilator passed easily all of the way into the distal abdominal aortic stent graft.  A 16 Fr Edwards Novoflex Plus introducer sheath is passed over the guidewire into the proximal abdominal aorta stent graft.  The introducing dilator is removed, the sheath is flushed with heparinized saline, and the sheath is secured to the skin.    FEMORAL SHEATH REMOVAL AND ARTERIAL CLOSURE:  After the completion of successful valve deployment as documented separately by Dr. Burt Knack, the femoral artery sheath is removed and the arteriotomy is closed using the previously placed Gore-tex purse-string sutures. Once the repair has been completed protamine was administered to reverse the anticoagulation. A digitally-subtracted arteriogram is obtained from just above the aortic bifurcation to below the arteriotomy to confirm the integrity of the right iliac and common femoral arteries.  The left common femoral artery had an excellent pulse beyond the puncture site with no obvious narrowing of the artery. The incision is irrigated with saline solution and subsequently closed in multiple layers using absorbable suture.  The skin incision is closed using a subcuticular skin closure.     Gaye Pollack, MD 03/23/2014 3:45 PM

## 2014-03-23 NOTE — Interval H&P Note (Signed)
History and Physical Interval Note:  03/23/2014 10:19 AM  [name] [name]  has presented today for surgery, with the diagnosis of aortic stenosis  The various methods of treatment have been discussed with the patient and family. After consideration of risks, benefits and other options for treatment, the patient has consented to  Procedure(s) with comments: TRANSCATHETER AORTIC VALVE REPLACEMENT, TRANSFEMORAL (N/A) - valve in valve TAVR INTRAOPERATIVE TRANSESOPHAGEAL ECHOCARDIOGRAM (N/A) as a surgical intervention .  The patient's history has been reviewed, patient examined, no change in status, stable for surgery.  I have reviewed the patient's chart and labs.  Questions were answered to the patient's satisfaction.     Gaye Pollack

## 2014-03-24 ENCOUNTER — Inpatient Hospital Stay (HOSPITAL_COMMUNITY): Payer: Medicare Other

## 2014-03-24 ENCOUNTER — Encounter (HOSPITAL_COMMUNITY): Payer: Self-pay | Admitting: Cardiovascular Disease

## 2014-03-24 DIAGNOSIS — I359 Nonrheumatic aortic valve disorder, unspecified: Secondary | ICD-10-CM

## 2014-03-24 LAB — CBC
HCT: 36.1 % — ABNORMAL LOW (ref 39.0–52.0)
Hemoglobin: 11.9 g/dL — ABNORMAL LOW (ref 13.0–17.0)
MCH: 32.5 pg (ref 26.0–34.0)
MCHC: 33 g/dL (ref 30.0–36.0)
MCV: 98.6 fL (ref 78.0–100.0)
PLATELETS: 83 10*3/uL — AB (ref 150–400)
RBC: 3.66 MIL/uL — ABNORMAL LOW (ref 4.22–5.81)
RDW: 13.4 % (ref 11.5–15.5)
WBC: 9 10*3/uL (ref 4.0–10.5)

## 2014-03-24 LAB — BASIC METABOLIC PANEL
ANION GAP: 10 (ref 5–15)
BUN: 24 mg/dL — AB (ref 6–23)
CALCIUM: 8.3 mg/dL — AB (ref 8.4–10.5)
CO2: 24 mEq/L (ref 19–32)
CREATININE: 1.15 mg/dL (ref 0.50–1.35)
Chloride: 109 mEq/L (ref 96–112)
GFR calc non Af Amer: 55 mL/min — ABNORMAL LOW (ref >90)
GFR, EST AFRICAN AMERICAN: 64 mL/min — AB (ref >90)
Glucose, Bld: 114 mg/dL — ABNORMAL HIGH (ref 70–99)
Potassium: 4.4 mEq/L (ref 3.7–5.3)
Sodium: 143 mEq/L (ref 137–147)

## 2014-03-24 LAB — GLUCOSE, CAPILLARY
GLUCOSE-CAPILLARY: 106 mg/dL — AB (ref 70–99)
Glucose-Capillary: 136 mg/dL — ABNORMAL HIGH (ref 70–99)

## 2014-03-24 LAB — MAGNESIUM: Magnesium: 1.9 mg/dL (ref 1.5–2.5)

## 2014-03-24 MED ORDER — LOSARTAN POTASSIUM 25 MG PO TABS
25.0000 mg | ORAL_TABLET | Freq: Every day | ORAL | Status: DC
  Administered 2014-03-24 – 2014-03-26 (×3): 25 mg via ORAL
  Filled 2014-03-24 (×3): qty 1

## 2014-03-24 MED ORDER — SODIUM CHLORIDE 0.9 % IV SOLN: 250.0000 mL | INTRAVENOUS | Status: DC | PRN

## 2014-03-24 MED ORDER — SODIUM CHLORIDE 0.9 % IJ SOLN
3.0000 mL | Freq: Two times a day (BID) | INTRAMUSCULAR | Status: DC
  Administered 2014-03-24 – 2014-03-29 (×11): 3 mL via INTRAVENOUS

## 2014-03-24 MED ORDER — MOVING RIGHT ALONG BOOK
Freq: Once | Status: DC
  Filled 2014-03-24: qty 1

## 2014-03-24 MED ORDER — SODIUM CHLORIDE 0.9 % IJ SOLN: 3.0000 mL | INTRAMUSCULAR | Status: DC | PRN

## 2014-03-24 MED ORDER — PHENOL 1.4 % MT LIQD
1.0000 | OROMUCOSAL | Status: DC | PRN
  Filled 2014-03-24: qty 177

## 2014-03-24 MED FILL — Potassium Chloride Inj 2 mEq/ML: INTRAVENOUS | Qty: 40 | Status: AC

## 2014-03-24 MED FILL — Magnesium Sulfate Inj 50%: INTRAMUSCULAR | Qty: 10 | Status: AC

## 2014-03-24 MED FILL — Dextrose Inj 5%: INTRAVENOUS | Qty: 250 | Status: AC

## 2014-03-24 MED FILL — Insulin Regular (Human) Inj 100 Unit/ML: INTRAMUSCULAR | Qty: 1 | Status: AC

## 2014-03-24 MED FILL — Heparin Sodium (Porcine) Inj 1000 Unit/ML: INTRAMUSCULAR | Qty: 30 | Status: AC

## 2014-03-24 MED FILL — Phenylephrine HCl Inj 10 MG/ML: INTRAMUSCULAR | Qty: 2 | Status: AC

## 2014-03-24 NOTE — Progress Notes (Signed)
  Echocardiogram 2D Echocardiogram has been performed.  [name] [name] 03/24/2014, 5:15 PM

## 2014-03-24 NOTE — Progress Notes (Addendum)
[facility] DAILY ICU PROGRESS NOTE                   [address]            [place] [address]          [phone]   1 Day Post-Op Procedure(s) (LRB): TRANSCATHETER AORTIC VALVE REPLACEMENT, TRANSFEMORAL (N/A) INTRAOPERATIVE TRANSESOPHAGEAL ECHOCARDIOGRAM (N/A)  Total Length of Stay:  LOS: 1 day   Subjective: OOB in chair, already walked in hall this am.  Feels well, no complaints.   Objective: Vital signs in last 24 hours: Temp:  [96.1 F (35.6 C)-98.2 F (36.8 C)] 98.1 F (36.7 C) (09/02 0700) Pulse Rate:  [49-75] 65 (09/02 0700) Cardiac Rhythm:  [-] Sinus bradycardia;Normal sinus rhythm (09/02 0700) Resp:  [14-44] 17 (09/02 0700) BP: (99-161)/(44-108) 147/63 mmHg (09/02 0700) SpO2:  [88 %-100 %] 96 % (09/02 0700) Arterial Line BP: (124-179)/(53-72) 179/72 mmHg (09/01 1730) Weight:  [181 lb (82.101 kg)-186 lb 11.2 oz (84.687 kg)] 186 lb 11.2 oz (84.687 kg) (09/02 0500)  Filed Weights   03/22/14 1700 03/23/14 0908 03/24/14 0500  Weight: 181 lb (82.101 kg) 181 lb (82.101 kg) 186 lb 11.2 oz (84.687 kg)    Weight change: 0 lb (0 kg)   Hemodynamic parameters for last 24 hours: PAP: (44-55)/(15-24) 55/24 mmHg CO:  [5.8 L/min] 5.8 L/min  Intake/Output from previous day: 09/01 0701 - 09/02 0700 In: 2628.4 [P.O.:100; I.V.:2328.4; IV Piggyback:200] Out: 855 [Urine:855]  Intake/Output this shift:    Current Meds: Scheduled Meds: . acetaminophen  1,000 mg Oral 4 times per day   Or  . acetaminophen (TYLENOL) oral liquid 160 mg/5 mL  1,000 mg Per Tube 4 times per day  . antiseptic oral rinse  7 mL Mouth Rinse BID  . aspirin  81 mg Oral Daily  . atorvastatin  80 mg Oral q1800  . clopidogrel  75 mg Oral Q breakfast  . famotidine (PEPCID) IV  20 mg Intravenous Q12H  . finasteride  5 mg Oral Daily  . insulin regular  0-10 Units Intravenous TID WC  . levofloxacin (LEVAQUIN) IV  750 mg Intravenous Q24H  . levothyroxine  50 mcg Oral QAC breakfast  .  metoprolol tartrate  12.5 mg Oral BID   Or  . metoprolol tartrate  12.5 mg Per Tube BID  . [START ON 03/25/2014] pantoprazole  40 mg Oral Daily  . PARoxetine  10 mg Oral QAC breakfast   Continuous Infusions: . dexmedetomidine Stopped (03/23/14 1500)  . insulin (NOVOLIN-R) infusion Stopped (03/23/14 1500)  . nitroGLYCERIN Stopped (03/23/14 1745)  . phenylephrine (NEO-SYNEPHRINE) Adult infusion Stopped (03/23/14 1500)   PRN Meds:.albumin human, fentaNYL, hydrALAZINE, metoprolol, ondansetron (ZOFRAN) IV, traMADol   Physical Exam: General appearance: alert, cooperative and no distress Heart: regular rate and rhythm Lungs: clear to auscultation bilaterally Extremities: extremities normal, atraumatic, no cyanosis or edema Wound: Groin soft, mild ecchymosis, no hematoma    Lab Results: CBC: Recent Labs  03/23/14 1500 03/23/14 1514 03/24/14 0355  WBC 6.8  --  9.0  HGB 12.3* 12.9* 11.9*  HCT 36.4* 38.0* 36.1*  PLT 96*  --  83*   BMET:  Recent Labs  03/23/14 1514 03/24/14 0355  NA 139 143  K 3.6* 4.4  CL  --  109  CO2  --  24  GLUCOSE 126* 114*  BUN  --  24*  CREATININE  --  1.15  CALCIUM  --  8.3*    PT/INR:  Recent Labs  03/23/14 1500  LABPROT 15.7*  INR 1.25   Radiology: Dg Chest Portable 1 View In Am  03/24/2014   CLINICAL DATA:  Transcatheter aortic valve replacement.  EXAM: PORTABLE CHEST - 1 VIEW  COMPARISON:  03/23/2014 and CT chest 02/18/2014.  FINDINGS: Right IJ Swan-Ganz catheter has been removed. Catheter sheath remains in place. Heart size stable. Lungs are somewhat low in volume with minimal bibasilar atelectasis. No airspace consolidation or pleural fluid.  IMPRESSION: Low lung volumes with mild bibasilar atelectasis.   Electronically Signed   By: Lorin Picket M.D.   On: 03/24/2014 07:27   Dg Chest Portable 1 View  03/23/2014   CLINICAL DATA:  Aortic valve replacement  EXAM: PORTABLE CHEST - 1 VIEW  COMPARISON:  03/19/2014  FINDINGS: Borderline  cardiomegaly. Central vascular congestion without convincing pulmonary edema. There is right Swan-Ganz catheter with tip in the region of main pulmonary artery. Status post median sternotomy and cardiac valve replacement. No pneumothorax.  IMPRESSION: Right Swan-Ganz catheter in place. Status post median sternotomy and cardiac valve replacement. No pneumothorax.   Electronically Signed   By: Lahoma Crocker M.D.   On: 03/23/2014 15:24   ECG: sinus rhythm, LVH  Assessment/Plan: S/P Procedure(s) (LRB): TRANSCATHETER AORTIC VALVE REPLACEMENT, TRANSFEMORAL (N/A) INTRAOPERATIVE TRANSESOPHAGEAL ECHOCARDIOGRAM (N/A)  CV- BPs have been elevated overnight requiring NTG gtt at times.  Presently off all gtts. Started on low dose Lopressor.  Discussed with Dr. Burt Knack- will resume Cozaar and titrate up to home dose.  Plavix/ASA started.  For echo this am.  Pulm- encouraged IS/pulm toilet, wean O2 as tolerated.  Expected postop blood loss anemia- H/H stable.  Thrombocytopenia- Watch plts.  He looks great overall.  Can probably tx stepdown today.   COLLINS,GINA H 03/24/2014 7:48 AM   Chart reviewed, patient examined, agree with above. Doing very well. Transfer to 2W, mobilize and plan home Friday.

## 2014-03-24 NOTE — Progress Notes (Signed)
    Subjective:  Feels well this am. Has already walked around unit. No dyspnea or chest pain.  Objective:  Vital Signs in the last 24 hours: Temp:  [96.1 F (35.6 C)-98.2 F (36.8 C)] 98.1 F (36.7 C) (09/02 0700) Pulse Rate:  [49-75] 65 (09/02 0700) Resp:  [14-44] 17 (09/02 0700) BP: (99-161)/(44-108) 147/63 mmHg (09/02 0700) SpO2:  [88 %-100 %] 96 % (09/02 0700) Arterial Line BP: (124-179)/(53-72) 179/72 mmHg (09/01 1730) Weight:  [181 lb (82.101 kg)-186 lb 11.2 oz (84.687 kg)] 186 lb 11.2 oz (84.687 kg) (09/02 0500)  Intake/Output from previous day: 09/01 0701 - 09/02 0700 In: 2628.4 [P.O.:100; I.V.:2328.4; IV Piggyback:200] Out: 83 [Urine:855]  Physical Exam: Pt is alert and oriented, pleasant elderly male in NAD HEENT: normal Neck: JVP - normal Lungs: CTA bilaterally CV: RRR with soft SEM at the RUSB, early peaking, no diastolic murmur  Abd: soft, NT Ext: no C/C/E, distal pulses intact and equal Skin: warm/dry no rash   Lab Results:  Recent Labs  03/23/14 1500 03/23/14 1514 03/24/14 0355  WBC 6.8  --  9.0  HGB 12.3* 12.9* 11.9*  PLT 96*  --  83*    Recent Labs  03/23/14 1514 03/24/14 0355  NA 139 143  K 3.6* 4.4  CL  --  109  CO2  --  24  GLUCOSE 126* 114*  BUN  --  24*  CREATININE  --  1.15   No results found for this basename: TROPONINI, CK, MB,  in the last 72 hours  Tele: Sinus rhythm, personally reviewed  Assessment/Plan:  1. Severe AI s/p TAVR, POD #1 2. HTN 3. Chronic diastolic CHF 4. Thrombocytopenia, expected post-TAVR  Tx Stepdown today. D/c lines and foley. Resume losartan. Add plavix 75 mg. Check 2D Echo today.  Sherren Mocha, M.D. 03/24/2014, 7:58 AM

## 2014-03-24 NOTE — Progress Notes (Signed)
Utilization Review Completed.  

## 2014-03-24 NOTE — Discharge Instructions (Signed)
You may shower and clean incisions daily with soap and water.  You may resume your regular diet. No driving, heavy lifting or strenuous activity.

## 2014-03-24 NOTE — Progress Notes (Signed)
[address]       [place] [address]             [phone]              Discharge Summary  Name: [name] [name] DOB: 06-Oct-1922 80 y.o. MRN: [id number]   Admission Date: 03/23/2014 Discharge Date: 03/26/2014    Admitting Diagnosis: Severe aortic insufficiency (status post AVR with Edwards Perimount tissue valve 01/2002)    Discharge Diagnosis:  Severe aortic insufficiency (status post AVR with Edwards Perimount tissue valve 01/2002)  Past Medical History  Diagnosis Date  . CVA (cerebral infarction) 12/09  . CAD (coronary artery disease)     stents in [place] and has a totally occluded RCA  . HTN (hypertension)   . Right iliac artery stenosis   . BPH (benign prostatic hyperplasia)   . Aortic stenosis     s/p AVR in 2003, bovine  . Diverticulosis   . AAA (abdominal aortic aneurysm)     s/p endovascular repair 2004  . Hyperlipemia   . Thyroid disease   . Glaucoma     recently told by Dr Janyth Contes he did not have it  . BCC (basal cell carcinoma) 09/17/2012  . Personal history of poliomyelitis 09/17/2012    In childhood  . Sleeping difficulties   . Leaky heart valve   . Chicken pox   . Polio   . Mumps   . Measles   . Aortic insufficiency     Prosthetic valve dysfunction  . Prosthetic valve dysfunction     Aortic stenosis and insufficiency   . Myocardial infarction   . Shortness of breath     exertion, orthopnea  . Stroke     no residual  . Pneumonia     hx of  . Depression   . Arthritis   . 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 He was treated with angioplasty and stent placement in the left circumflex coronary artery. He was noted to have small chronically occluded right coronary artery as well. He underwent aortic valve replacement by Dr. Cyndia Bent in 2003 using an Andrews bovine bioprosthetic tissue valve for severe symptomatic aortic stenosis. At that time, the stent in the left circumflex coronary artery remained widely patent and there has not been significant progression of coronary artery disease. The patient recovered from the surgery uneventfully and has done well from a cardiovascular standpoint until more recently. Followup echocardiogram performed in October 2014 demonstrated the presence of moderate central aortic insufficiency related to prosthetic valve dysfunction. Left ventricular function was normal and the patient was followed clinically. In January, the patient presented with worsening symptoms of congestive heart failure. He was treated medically with some degree of success, but followup transesophageal echocardiogram performed in February confirmed the presence of significant prosthetic valve dysfunction with at least moderate central aortic insufficiency as well as 2 small jets what was felt to be  perivalvular leak. The patient was referred to Dr. Cyndia Bent in February to consider redo aortic valve replacement. Because of the patient's advanced age and numerous comorbid medical problems, he was felt to be relatively high-risk and medical therapy was recommended. Initially the patient did reasonably well, but over the past six months he has experienced continued progression of symptoms of chronic diastolic congestive heart failure. He was seen in followup by Dr. Burt Knack in July after having repeat transthoracic  echocardiogram performed in June. Echocardiogram revealed slight decrease in left ventricular systolic function with ejection fraction estimated 50-55%. Subsequently cardiac catheterization was performed. This revealed stable nonobstructive coronary artery disease involving the left coronary system with continued patency of the stent in the left circumflex coronary artery and stable chronic high-grade stenosis of the small right coronary artery. The patient has been seen back in followup by Dr. Cyndia Bent, and after consultation with both Dr. Cyndia Bent and Dr. Burt Knack transcatheter aortic valve replacement was recommended as an alternative to high-risk conventional surgery.All risks, benefits and alternatives of surgery were explained in detail, and the patient agreed to proceed.    Hospital Course:  The patient was admitted to [facility] on 03/23/2014. The patient was taken to the operating room and underwent the above procedure.    The postoperative course was notable for hypertension in the early course.  The patient was restarted on his home dose of Cozaar as well as a beta blocker with good blood pressure control.  He was also started on Plavix and Aspirin. He has been ambulating in the hall and tolerating a regular diet.  He has had a bowel movement. He is felt surgically stable for discharge today.   Recent vital signs:  Filed Vitals:   03/26/14 0359  BP: 133/38  Pulse: 66  Temp: 98.2 F (36.8 C)  Resp: 18    Recent laboratory studies:  CBC:  Recent Labs  03/25/14 0500 03/26/14 0352  WBC 11.6* 9.9  HGB 13.3 12.3*  HCT 41.0 37.4*  PLT 81* 81*   BMET:   Recent Labs  03/24/14 0355 03/25/14 0500  NA 143 143  K 4.4 5.2  CL 109 108  CO2 24 23  GLUCOSE 114* 122*  BUN 24* 28*  CREATININE 1.15 1.32  CALCIUM 8.3* 9.1    PT/INR:   Recent Labs  03/23/14 1500  LABPROT 15.7*  INR 1.25     Discharge Medications:     Medication List    STOP taking these medications        potassium chloride 10 MEQ tablet  Commonly known as:  K-DUR,KLOR-CON      TAKE these medications       aspirin 81 MG tablet  Take 81 mg by mouth daily.     atorvastatin 80 MG tablet  Commonly known as:  LIPITOR  Take 1 tablet (80 mg total) by mouth daily.     clopidogrel 75 MG tablet  Commonly known as:  PLAVIX  Take 1 tablet (75 mg total) by mouth daily with breakfast.     ezetimibe 10 MG tablet  Commonly known as:  ZETIA  Take 1 tablet (10 mg total) by mouth daily.     finasteride 5 MG tablet  Commonly known as:  PROSCAR  Take 1 tablet (5 mg total) by mouth daily.     furosemide 40 MG tablet  Commonly known as:  LASIX  Take 0.5 tablets (20 mg total) by mouth daily. Take extra half tablet ( 20 mg) for  weight gain up to 2-3 pounds over night     levothyroxine 50 MCG tablet  Commonly known as:  SYNTHROID, LEVOTHROID  Take 1 tablet (50 mcg total) by mouth daily.     losartan 50 MG tablet  Commonly known as:  COZAAR  Take 1 tablet (50 mg total) by mouth daily.     metoprolol tartrate 25 MG tablet  Commonly known as:  LOPRESSOR  Take 0.5 tablets (12.5 mg total) by mouth 2 (two) times daily.     PARoxetine 20 MG tablet  Commonly known as:  PAXIL  Take 0.5 tablets (10 mg total) by mouth every morning.     SUPER B COMPLEX Tabs  Take 1 tablet by mouth daily.     traMADol 50 MG tablet  Commonly known as:  ULTRAM  Take 1 tablet (50 mg total) by mouth every 6 (six) hours as needed for moderate pain.         Discharge Instructions:  The patient is to refrain from driving, heavy lifting or strenuous activity.  May shower daily and clean incisions with soap and water.  May resume regular diet.   Follow Up:  Follow-up Information   Follow up with Gaye Pollack, MD. (Office will arrange follow up in the TAVR clinic)    Specialty:  Cardiothoracic Surgery   Contact information:   [place] [address] [phone]       Follow up with  Sherren Mocha, MD. (Call for a follow up appointment)    Specialty:  Cardiology   Contact information:   [address] [place] [address] [phone]        Nani Skillern PA-C 03/26/2014, 8:18 AM

## 2014-03-24 NOTE — Progress Notes (Signed)
CARDIAC REHAB PHASE I   PRE:  Rate/Rhythm: 82 SR  BP:  Supine:   Sitting: 147/54  Standing:    SaO2: 90-92%RA ear  MODE:  Ambulation: 460 ft   POST:  Rate/Rhythm: 85  BP:  Supine:   Sitting: 164/47  Standing:     SaO2: 97%RA 1450-1525 Pt walked 460 ft with rolling walker and minimal asst . Tolerated well. To bed after walk. Slightly DOE with walk but sats good.    Graylon Good, RN BSN  03/24/2014 3:21 PM

## 2014-03-24 NOTE — Anesthesia Postprocedure Evaluation (Signed)
  Anesthesia Post-op Note  Patient: [name] [name]  Procedure(s) Performed: Procedure(s) with comments: TRANSCATHETER AORTIC VALVE REPLACEMENT, TRANSFEMORAL (N/A) - valve in valve TAVR INTRAOPERATIVE TRANSESOPHAGEAL ECHOCARDIOGRAM (N/A)  Patient Location: ICU  Anesthesia Type:General  Level of Consciousness: awake and alert   Airway and Oxygen Therapy: Patient Spontanous Breathing and Patient connected to nasal cannula oxygen  Post-op Pain: mild  Post-op Assessment: Post-op Vital signs reviewed, Patient's Cardiovascular Status Stable, Respiratory Function Stable, Patent Airway and Pain level controlled  Post-op Vital Signs: Reviewed and stable  Last Vitals:  Filed Vitals:   03/24/14 1231  BP: 151/84  Pulse: 80  Temp: 36.4 C  Resp: 18    Complications: No apparent anesthesia complications

## 2014-03-25 LAB — GLUCOSE, CAPILLARY: Glucose-Capillary: 113 mg/dL — ABNORMAL HIGH (ref 70–99)

## 2014-03-25 LAB — BASIC METABOLIC PANEL
Anion gap: 12 (ref 5–15)
BUN: 28 mg/dL — AB (ref 6–23)
CALCIUM: 9.1 mg/dL (ref 8.4–10.5)
CHLORIDE: 108 meq/L (ref 96–112)
CO2: 23 meq/L (ref 19–32)
CREATININE: 1.32 mg/dL (ref 0.50–1.35)
GFR calc Af Amer: 54 mL/min — ABNORMAL LOW (ref >90)
GFR calc non Af Amer: 46 mL/min — ABNORMAL LOW (ref >90)
Glucose, Bld: 122 mg/dL — ABNORMAL HIGH (ref 70–99)
Potassium: 5.2 mEq/L (ref 3.7–5.3)
Sodium: 143 mEq/L (ref 137–147)

## 2014-03-25 LAB — CBC
HEMATOCRIT: 41 % (ref 39.0–52.0)
Hemoglobin: 13.3 g/dL (ref 13.0–17.0)
MCH: 32.4 pg (ref 26.0–34.0)
MCHC: 32.4 g/dL (ref 30.0–36.0)
MCV: 100 fL (ref 78.0–100.0)
Platelets: 81 10*3/uL — ABNORMAL LOW (ref 150–400)
RBC: 4.1 MIL/uL — AB (ref 4.22–5.81)
RDW: 13.8 % (ref 11.5–15.5)
WBC: 11.6 10*3/uL — ABNORMAL HIGH (ref 4.0–10.5)

## 2014-03-25 MED ORDER — SENNOSIDES-DOCUSATE SODIUM 8.6-50 MG PO TABS
1.0000 | ORAL_TABLET | Freq: Two times a day (BID) | ORAL | Status: DC | PRN
  Administered 2014-03-25: 1 via ORAL
  Filled 2014-03-25: qty 1

## 2014-03-25 MED ORDER — LACTULOSE 10 GM/15ML PO SOLN
20.0000 g | Freq: Once | ORAL | Status: AC
  Administered 2014-03-25: 20 g via ORAL
  Filled 2014-03-25: qty 30

## 2014-03-25 NOTE — Progress Notes (Addendum)
      [address]       [place] [address]             [phone]        2 Days Post-Op Procedure(s) (LRB): TRANSCATHETER AORTIC VALVE REPLACEMENT, TRANSFEMORAL (N/A) INTRAOPERATIVE TRANSESOPHAGEAL ECHOCARDIOGRAM (N/A)  Subjective: Patient had an interesting night. Has no specific complaints this morning.  Objective: Vital signs in last 24 hours: Temp:  [97.5 F (36.4 C)-98.9 F (37.2 C)] 98.7 F (37.1 C) (09/03 0500) Pulse Rate:  [69-82] 69 (09/03 0500) Cardiac Rhythm:  [-] Normal sinus rhythm;Sinus bradycardia (09/02 2030) Resp:  [18-28] 21 (09/03 0500) BP: (131-151)/(55-93) 140/93 mmHg (09/03 0500) SpO2:  [91 %-99 %] 93 % (09/03 0500) Weight:  [188 lb 7.9 oz (85.5 kg)] 188 lb 7.9 oz (85.5 kg) (09/03 0500)   Current Weight  03/25/14 188 lb 7.9 oz (85.5 kg)     Intake/Output from previous day: 09/02 0701 - 09/03 0700 In: 633 [P.O.:480; I.V.:3; IV Piggyback:150] Out: 200 [Urine:200]   Physical Exam:  Cardiovascular: RRR, no murmurs, gallops, or rubs. Pulmonary: Clear to auscultation bilaterally; no rales, wheezes, or rhonchi. Abdomen: Soft, non tender, bowel sounds present. Extremities: Mild bilateral lower extremity edema. Pulses intact. Ecchymosis left groin, but soft. Wound: Clean and dry.  No erythema or signs of infection.  Lab Results: CBC: Recent Labs  03/24/14 0355 03/25/14 0500  WBC 9.0 11.6*  HGB 11.9* 13.3  HCT 36.1* 41.0  PLT 83* 81*   BMET:  Recent Labs  03/24/14 0355 03/25/14 0500  NA 143 143  K 4.4 5.2  CL 109 108  CO2 24 23  GLUCOSE 114* 122*  BUN 24* 28*  CREATININE 1.15 1.32  CALCIUM 8.3* 9.1    PT/INR:  Lab Results  Component Value Date   INR 1.25 03/23/2014   INR 1.04 03/19/2014   INR 1.0 02/04/2014   ABG:  INR: Will add last result for INR, ABG once components are confirmed Will add last 4 CBG results once components are confirmed  Assessment/Plan:  1. CV - SR in the low 80's. On Lopressor 12.5  bid, Cozaar 25 daily, and Plavix 75 daily. 2.  Pulmonary - Encourage incentive spirometer 3. Creatinine slightly increased from 1.15 to 1.32. Appears baseline is around 1.4 4. Thrombocytopenia-platelets stable around 81,000. Recheck in am 5.  CBGs 106/136/113. Pre op HGA1C 6.3. Likely pre diabetic and can follow up with medical doctor as an outpatient. 6. Likely discharge in am  ZIMMERMAN,DONIELLE MPA-C 03/25/2014,8:04 AM   Chart reviewed, patient examined, agree with above. He looks good but did not sleep much overnight due to interruptions. Has not had a BM yet but received lactulose this afternoon. Plan home in am if no changes.

## 2014-03-25 NOTE — Progress Notes (Signed)
Spoke with Lars Pinks PA-C regarding patients hypoactive BS and decreased appetite. Patient states at home he takes miriliax when constipated. Per PA would like to try lactulose if patient compliant due to elevated Creat level. Patient agreeable and ordered for 20mg /56ml lactulose per verbal order with PA.   Darel Hong, RN

## 2014-03-25 NOTE — Progress Notes (Signed)
CARDIAC REHAB PHASE I   PRE:  Rate/Rhythm: 79 SR  BP:  Supine:   Sitting: 163/69  Standing:    SaO2: 97%RA ear  MODE:  Ambulation: 550 ft   POST:  Rate/Rhythm: 92SR  BP:  Supine:   Sitting: 157/68  Standing:    SaO2: 93%RA ear 1000-1022 Pt walked 550 ft on RA with rolling walker and minimal asst. Tolerated well. Slightly SOB. Sats good on his ear. Stated he has walker at home if needed. To chair with call bell.   Graylon Good, RN BSN  03/25/2014 10:19 AM

## 2014-03-25 NOTE — Progress Notes (Signed)
Subjective:  No chest pain or dyspnea. Had frequent interruptions overnight and didn't sleep well. Some difficulty with constipation and bladder emptying.  Objective:  Vital Signs in the last 24 hours: Temp:  [97.5 F (36.4 C)-98.9 F (37.2 C)] 98.7 F (37.1 C) (09/03 0500) Pulse Rate:  [69-82] 69 (09/03 0500) Resp:  [18-28] 21 (09/03 0500) BP: (131-151)/(55-93) 140/93 mmHg (09/03 0500) SpO2:  [91 %-99 %] 93 % (09/03 0500) Weight:  [188 lb 7.9 oz (85.5 kg)] 188 lb 7.9 oz (85.5 kg) (09/03 0500)  Intake/Output from previous day: 09/02 0701 - 09/03 0700 In: 35 [P.O.:480; I.V.:3; IV Piggyback:150] Out: 200 [Urine:200]  Physical Exam: Pt is alert and oriented, elderly male in NAD HEENT: normal Neck: JVP - normal Lungs: CTA bilaterally CV: RRR with grade 2/6 systolic murmur at the LSB/RUSB Abd: soft, NT, Positive BS, no hepatomegaly Ext: no C/C/E, right groin clear, left groin incision clear but ecchymoses noted into the suprapubic area with associated swelling. No firm hematoma  Lab Results:  Recent Labs  03/24/14 0355 03/25/14 0500  WBC 9.0 11.6*  HGB 11.9* 13.3  PLT 83* 81*    Recent Labs  03/24/14 0355 03/25/14 0500  NA 143 143  K 4.4 5.2  CL 109 108  CO2 24 23  GLUCOSE 114* 122*  BUN 24* 28*  CREATININE 1.15 1.32   No results found for this basename: TROPONINI, CK, MB,  in the last 72 hours  Cardiac Studies: 2D Echo: Left ventricle: The cavity size was normal. Wall thickness was normal. Systolic function was moderately reduced. The estimated ejection fraction was in the range of 35% to 40%. Doppler parameters are consistent with abnormal left ventricular relaxation (grade 1 diastolic dysfunction).  ------------------------------------------------------------------- Aortic valve: S/P TAVR. Doppler: There was mild stenosis. There was no significant regurgitation. VTI ratio of LVOT to aortic valve: 0.32. Valve area (VTI): 1.23 cm^2. Indexed valve  area (VTI): 0.62 cm^2/m^2. Peak velocity ratio of LVOT to aortic valve: 0.33. Valve area (Vmax): 1.27 cm^2. Indexed valve area (Vmax): 0.64 cm^2/m^2. Mean velocity ratio of LVOT to aortic valve: 0.34. Valve area (Vmean): 1.28 cm^2. Indexed valve area (Vmean): 0.64 cm^2/m^2. Mean gradient (S): 22 mm Hg. Peak gradient (S): 39 mm Hg.  ------------------------------------------------------------------- Aorta: Aortic root: The aortic root was normal in size. Ascending aorta: The ascending aorta was normal in size.  ------------------------------------------------------------------- Mitral valve: Structurally normal valve. Leaflet separation was normal. Doppler: Transvalvular velocity was within the normal range. There was no evidence for stenosis. There was no regurgitation. Peak gradient (D): 2 mm Hg.  ------------------------------------------------------------------- Left atrium: The atrium was mildly dilated.  ------------------------------------------------------------------- Right ventricle: The cavity size was normal. Systolic function was normal.  ------------------------------------------------------------------- Pulmonic valve: Structurally normal valve. Cusp separation was normal. Doppler: Transvalvular velocity was within the normal range. There was no regurgitation.  ------------------------------------------------------------------- Tricuspid valve: Structurally normal valve. Leaflet separation was normal. Doppler: Transvalvular velocity was within the normal range. There was no regurgitation.  ------------------------------------------------------------------- Right atrium: The atrium was normal in size.  ------------------------------------------------------------------- Pericardium: There was no pericardial effusion.   Tele: Personally reviewed: sinus rhythm  Assessment/Plan:  1. Severe bioprosthetic AI s/p TAVR (valve-in-valve), POD #2 2. HTN  3. Chronic  diastolic CHF  4. Thrombocytopenia, expected post-TAVR 5. Constipation  Echo images reviewed. No residual AI. By my review his LVEF appears stable and I would estimate at 50-55%. Mildly increased transvalvular gradients expected as original valve is 21 mm. Will write for laxative, continue to mobilize. Anticipate  discharge tomorrow per Dr Cyndia Bent. Will arrange a transition of care visit with Remer Macho next week if schedule permits.  Sherren Mocha, M.D. 03/25/2014, 8:59 AM

## 2014-03-25 NOTE — Care Management Note (Signed)
    Page 1 of 1   03/29/2014     11:25:57 AM CARE MANAGEMENT NOTE 03/29/2014  Patient:  [name] [name], [name] [name]   Account Number:  [id number]  Date Initiated:  03/25/2014  Documentation initiated by:  AMERSON,JULIE  Subjective/Objective Assessment:   Pt adm s/p TAVR on 03/23/14.  PTA, pt independent, lives with daughter.     Action/Plan:   Will follow for dc needs as pt progresses.   Anticipated DC Date:  03/26/2014   Anticipated DC Plan:  [place]  CM consult      Choice offered to / List presented to:             Status of service:  In process, will continue to follow Medicare Important Message given?  YES (If response is "NO", the following Medicare IM given date fields will be blank) Date Medicare IM given:  03/29/2014 Medicare IM given by:  [facility] Date Additional Medicare IM given:   Additional Medicare IM given by:    Discharge Disposition:    Per UR Regulation:  Reviewed for med. necessity/level of care/duration of stay  If discussed at [place] of Stay Meetings, dates discussed:    Comments:  03-29-14 11:22am Luz Lex, RNBSN [phone] Lives at home with demented wife who he cares for.  Son and daughter live across street and stay at night and care for wife in his absence.  Both work during day.  Plan for caregiver to be with them while other family works to assist with meals, etc until he is able.  Understands can go home with f/c but will need f/u with urology.  Agreeable to [facility] RN [facility] - on discharge to follow up.

## 2014-03-26 LAB — CBC
HCT: 37.4 % — ABNORMAL LOW (ref 39.0–52.0)
HEMOGLOBIN: 12.3 g/dL — AB (ref 13.0–17.0)
MCH: 31.9 pg (ref 26.0–34.0)
MCHC: 32.9 g/dL (ref 30.0–36.0)
MCV: 97.1 fL (ref 78.0–100.0)
Platelets: 81 10*3/uL — ABNORMAL LOW (ref 150–400)
RBC: 3.85 MIL/uL — AB (ref 4.22–5.81)
RDW: 13.8 % (ref 11.5–15.5)
WBC: 9.9 10*3/uL (ref 4.0–10.5)

## 2014-03-26 LAB — URINALYSIS, ROUTINE W REFLEX MICROSCOPIC
Glucose, UA: 100 mg/dL — AB
Ketones, ur: NEGATIVE mg/dL
NITRITE: POSITIVE — AB
PROTEIN: 100 mg/dL — AB
SPECIFIC GRAVITY, URINE: 1.025 (ref 1.005–1.030)
Urobilinogen, UA: 0.2 mg/dL (ref 0.0–1.0)
pH: 5 (ref 5.0–8.0)

## 2014-03-26 LAB — URINE MICROSCOPIC-ADD ON

## 2014-03-26 MED ORDER — METOPROLOL TARTRATE 25 MG PO TABS: 12.5000 mg | ORAL_TABLET | Freq: Two times a day (BID) | ORAL | Status: DC

## 2014-03-26 MED ORDER — LOSARTAN POTASSIUM 50 MG PO TABS
50.0000 mg | ORAL_TABLET | Freq: Every day | ORAL | Status: DC
  Administered 2014-03-27 – 2014-03-29 (×3): 50 mg via ORAL
  Filled 2014-03-26 (×3): qty 1

## 2014-03-26 MED ORDER — TAMSULOSIN HCL 0.4 MG PO CAPS
0.4000 mg | ORAL_CAPSULE | Freq: Every day | ORAL | Status: DC
  Administered 2014-03-26 – 2014-03-29 (×4): 0.4 mg via ORAL
  Filled 2014-03-26 (×4): qty 1

## 2014-03-26 MED ORDER — CLOPIDOGREL BISULFATE 75 MG PO TABS: 75.0000 mg | ORAL_TABLET | Freq: Every day | ORAL | Status: DC

## 2014-03-26 MED ORDER — TRAMADOL HCL 50 MG PO TABS: 50.0000 mg | ORAL_TABLET | Freq: Four times a day (QID) | ORAL | Status: DC | PRN

## 2014-03-26 NOTE — Progress Notes (Addendum)
Patient ID: [name] [name], male   DOB: May 19, 1923, 80 y.o.   MRN: [id number]  He passed 50 cc dribbling and then had 606 cc residual and still uncomfortable. Will insert foley. He has been on Proscar at home for BPH and has no problem urinating at home. I suspect the foley and surgery has flared things up. Will leave the catheter in for a couple days. Add Flomax. Keep in the hospital for now. Send UA and culture.

## 2014-03-26 NOTE — Progress Notes (Signed)
Did bladder scan after urinating 50cc there was 608 cc of urine after voiding

## 2014-03-26 NOTE — Progress Notes (Signed)
CARDIAC REHAB PHASE I   PRE:  Rate/Rhythm: 73 SR  BP:  Supine:   Sitting: 118/90  Standing:    SaO2: 94%RA  MODE:  Ambulation: 460 ft   POST:  Rate/Rhythm: 82 SR  BP:  Supine: 147/50  Sitting:   Standing:    SaO2: 92%RA 1015-1056 Pt having discomfort with voiding. Walked 460 ft on RA with rolling walker and asst x 1 but cut walk short due to need to use bathroom. To bed after voiding. Pt seems very tired today. Reviewed ex ed and left diet sheet. Encouraged IS. Discussed CRP 2 and pt stated he will think about doing. He has done it before. Encouraged pt to read over material when he feels better. Ex ed written down.     Graylon Good, RN BSN  03/26/2014 10:52 AM

## 2014-03-26 NOTE — Discharge Summary (Signed)
[address]       [place] [address]             [phone]              Discharge Summary  Name: [name] [name] DOB: 11-30-1922 80 y.o. MRN: [id number]   Admission Date: 03/23/2014 Discharge Date: 03/29/2014    Admitting Diagnosis: Severe aortic insufficiency (status post AVR with Edwards Perimount tissue valve 01/2002)  Discharge Diagnosis:  Severe aortic insufficiency (status post AVR with Edwards Perimount tissue valve 01/2002) Urinary retention (history of BPH)  Past Medical History  Diagnosis Date  . CVA (cerebral infarction) 12/09  . CAD (coronary artery disease)     stents in [place] and has a totally occluded RCA  . HTN (hypertension)   . Right iliac artery stenosis   . BPH (benign prostatic hyperplasia)   . Aortic stenosis     s/p AVR in 2003, bovine  . Diverticulosis   . AAA (abdominal aortic aneurysm)     s/p endovascular repair 2004  . Hyperlipemia   . Thyroid disease   . Glaucoma     recently told by Dr Janyth Contes he did not have it  . BCC (basal cell carcinoma) 09/17/2012  . Personal history of poliomyelitis 09/17/2012    In childhood  . Sleeping difficulties   . Leaky heart valve   . Chicken pox   . Polio   . Mumps   . Measles   . Aortic insufficiency     Prosthetic valve dysfunction  . Prosthetic valve dysfunction     Aortic stenosis and insufficiency   . Myocardial infarction   . Shortness of breath     exertion, orthopnea  . Stroke     no residual  . Pneumonia     hx of  . Depression   . Arthritis   . 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 He was treated with angioplasty and stent placement in the left circumflex coronary artery. He was noted to have small chronically occluded right coronary artery as well. He underwent aortic valve replacement by Dr. Cyndia Bent in 2003 using an [place] bovine bioprosthetic tissue valve for severe symptomatic aortic stenosis. At that time, the stent in the left circumflex coronary artery remained widely patent and there has not been significant progression of coronary artery disease. The patient recovered from the surgery uneventfully and has done well from a cardiovascular standpoint until more recently. Followup echocardiogram performed in October 2014 demonstrated the presence of moderate central aortic insufficiency related to prosthetic valve dysfunction. Left ventricular function was normal and the patient was followed clinically. In January, the patient presented with worsening symptoms of congestive heart failure. He was treated medically with some degree of success, but followup transesophageal echocardiogram performed in February confirmed the presence of significant prosthetic valve dysfunction with at least moderate central aortic insufficiency as well as 2 small jets what was felt to  be perivalvular leak. The patient was referred to Dr. Cyndia Bent in February to consider redo aortic valve replacement. Because of the patient's advanced age and numerous comorbid medical problems, he was felt to be relatively high-risk and medical therapy was recommended. Initially the patient did reasonably well, but over the past six months he has experienced continued progression of symptoms of chronic diastolic congestive heart failure. He was seen in followup by Dr. Burt Knack in July after having  repeat transthoracic echocardiogram performed in June. Echocardiogram revealed slight decrease in left ventricular systolic function with ejection fraction estimated 50-55%. Subsequently cardiac catheterization was performed. This revealed stable nonobstructive coronary artery disease involving the left coronary system with continued patency of the stent in the left circumflex coronary artery and stable chronic high-grade stenosis of the small right coronary artery. The patient has been seen back in followup by Dr. Cyndia Bent, and after consultation with both Dr. Cyndia Bent and Dr. Burt Knack transcatheter aortic valve replacement was recommended as an alternative to high-risk conventional surgery.All risks, benefits and alternatives of surgery were explained in detail, and the patient agreed to proceed.    Hospital Course:  The patient was admitted to [facility] on 03/23/2014. The patient was taken to the operating room and underwent the above procedure.    The postoperative course was notable for hypertension in the early course.  The patient was restarted on his home dose of Cozaar as well as a beta blocker with good blood pressure control.  He was also started on Plavix and Aspirin. He has been ambulating in the hall and tolerating a regular diet.  He has had a bowel movement. He developed urinary retention. He has a history of BPH and has been on his Proscar as taken pre operatively. Bladder scan revealed 600+ cc. Foley inserted. UA and urine culture have been obtained. In addition, will start Flomax.  Urinalysis was positive and the patient was started on IV Levaquin which will be transitioned to oral regimen at discharge.  Urine culture did not show any growth.  Foley removal was again attempted and patient did not void for another 10 hours.  Bladder scan was performed and showed 430 cc.  It was felt foley catheter should be reinserted and he will be discharged home with a foley.  Home health arrangements were made.   He was instructed to follow up with [facility].  He is felt medically stable for discharge on 03/29/2014.  Recent vital signs:  Filed Vitals:   03/29/14 0608  BP: 137/54  Pulse: 72  Temp: 97.5 F (36.4 C)  Resp: 18    Recent laboratory studies:  CBC: No results found for this basename: WBC, HGB, HCT, PLT,  in the last 72 hours BMET:  No results found for this basename: NA, K, CL, CO2, GLUCOSE, BUN, CREATININE, CALCIUM,  in the last 72 hours  PT/INR:  No results found for this basename: LABPROT, INR,  in the last 72 hours   Discharge Medications:    The patient has been discharged on:   1.Beta Blocker:  Yes [x   ]                              No   [   ]                              If No, reason:  2.Ace  Inhibitor/ARB: Yes [ x  ]                                     No  [    ]                                     If No, reason:  3.Statin:   Yes [x   ]                  No  [   ]                  If No, reason:  4.Ecasa:  Yes  [ x  ]                  No   [   ]                  If No, reason:     Medication List    STOP taking these medications       potassium chloride 10 MEQ tablet  Commonly known as:  K-DUR,KLOR-CON      TAKE these medications       aspirin 81 MG tablet  Take 81 mg by mouth daily.     atorvastatin 80 MG tablet  Commonly known as:  LIPITOR  Take 1 tablet (80 mg total) by mouth daily.     clopidogrel 75 MG tablet  Commonly known as:  PLAVIX  Take 1 tablet (75 mg total) by mouth daily with breakfast.     ezetimibe 10 MG tablet  Commonly known as:  ZETIA  Take 1 tablet (10 mg total) by mouth daily.     finasteride 5 MG tablet  Commonly known as:  PROSCAR  Take 1 tablet (5 mg total) by mouth daily.     furosemide 40 MG tablet  Commonly known as:  LASIX  Take 0.5 tablets (20 mg total) by mouth daily. Take extra half tablet ( 20 mg) for weight gain up to 2-3 pounds over night     levofloxacin 250 MG tablet  Commonly known as:   LEVAQUIN  Take 1 tablet (250 mg total) by mouth daily. For 5 Days     levothyroxine 50 MCG tablet  Commonly known as:  SYNTHROID, LEVOTHROID  Take 1 tablet (50 mcg total) by mouth daily.     losartan 50 MG tablet  Commonly known as:  COZAAR  Take 1 tablet (50 mg total) by mouth daily.     metoprolol tartrate 25 MG tablet  Commonly known as:  LOPRESSOR  Take 0.5 tablets (12.5 mg total) by mouth 2 (two) times daily.     PARoxetine 20 MG tablet  Commonly known as:  PAXIL  Take 0.5 tablets (10 mg total) by mouth every morning.     SUPER B COMPLEX Tabs  Take 1 tablet by mouth daily.     tamsulosin 0.4 MG Caps capsule  Commonly known as:  FLOMAX  Take 1 capsule (0.4 mg total) by mouth daily.     traMADol 50 MG tablet  Commonly known as:  ULTRAM  Take 1 tablet (50 mg total) by mouth every 6 (six) hours as needed for moderate pain.        Discharge Instructions:  The patient  is to refrain from driving, heavy lifting or strenuous activity.  May shower daily and clean incisions with soap and water.  May resume regular diet.   Follow Up: Discharge Instructions   Amb Referral to Cardiac Rehabilitation    Complete by:  As directed           Follow-up Information   Follow up with Gaye Pollack, MD On 04/07/2014. (follow up is at 11:30)    Specialty:  Cardiothoracic Surgery   Contact information:   [place] [address] [phone]       Follow up with Sherren Mocha, MD On 04/16/2014. (Appointment is at 10:30, will need ECHO at 9:30)    Specialty:  Cardiology   Contact information:   [address] [place] [address] [phone]       Follow up with [facility] (please contact office at discharge to set up follow up appointment)    Contact information:   [address]  [place] [address] [phone]        Ellwood Handler PA-C 03/30/2014, 8:15 AM

## 2014-03-26 NOTE — Progress Notes (Addendum)
      [address]       [place] [address]             [phone]        3 Days Post-Op Procedure(s) (LRB): TRANSCATHETER AORTIC VALVE REPLACEMENT, TRANSFEMORAL (N/A) INTRAOPERATIVE TRANSESOPHAGEAL ECHOCARDIOGRAM (N/A)  Subjective: Patient had a large bowel movement. Has no complaints this am.  Objective: Vital signs in last 24 hours: Temp:  [97.4 F (36.3 C)-98.2 F (36.8 C)] 98.2 F (36.8 C) (09/04 0359) Pulse Rate:  [65-81] 66 (09/04 0359) Cardiac Rhythm:  [-] Normal sinus rhythm (09/03 2032) Resp:  [18] 18 (09/04 0359) BP: (133-159)/(38-74) 133/38 mmHg (09/04 0359) SpO2:  [93 %-94 %] 93 % (09/04 0359) Weight:  [184 lb 15.5 oz (83.9 kg)] 184 lb 15.5 oz (83.9 kg) (09/04 0500)   Current Weight  03/26/14 184 lb 15.5 oz (83.9 kg)     Intake/Output from previous day: 09/03 0701 - 09/04 0700 In: 600 [P.O.:600] Out: 150 [Urine:150]   Physical Exam:  Cardiovascular: RRR, soft systolic murmur Pulmonary: Clear to auscultation bilaterally; no rales, wheezes, or rhonchi. Abdomen: Soft, non tender, bowel sounds present. Extremities: Mild bilateral lower extremity edema. Pulses intact. Ecchymosis left groin, but soft. Wound: Clean and dry.  No erythema or signs of infection.  Lab Results: CBC:  Recent Labs  03/25/14 0500 03/26/14 0352  WBC 11.6* 9.9  HGB 13.3 12.3*  HCT 41.0 37.4*  PLT 81* 81*   BMET:   Recent Labs  03/24/14 0355 03/25/14 0500  NA 143 143  K 4.4 5.2  CL 109 108  CO2 24 23  GLUCOSE 114* 122*  BUN 24* 28*  CREATININE 1.15 1.32  CALCIUM 8.3* 9.1    PT/INR:  Lab Results  Component Value Date   INR 1.25 03/23/2014   INR 1.04 03/19/2014   INR 1.0 02/04/2014   ABG:  INR: Will add last result for INR, ABG once components are confirmed Will add last 4 CBG results once components are confirmed  Assessment/Plan:  1. CV - SR in the low 80's. On Lopressor 12.5 bid, Cozaar 25 daily, and Plavix 75 daily. SBP in the  130-140's. On Cozaar 50 pre op so will resume for better bp control. 2.  Pulmonary - Encourage incentive spirometer 3. Creatinine slightly increased from 1.15 to 1.32. Appears baseline is around 1.4 4. Thrombocytopenia-platelets remain stable around 81,000.  5.  CBGs 106/136/113. Pre op HGA1C 6.3. Likely pre diabetic and can follow up with medical doctor as an outpatient. 6. Discharge later this am  ZIMMERMAN,DONIELLE MPA-C 03/26/2014,7:48 AM  He is doing well overall but complains of lower abdominal pain and says he does not think he is emptying his bladder. When he urinates it just dribbles out. Will scan his bladder after he urinates and see what the residual is. He is back on his Proscar. He can't go home if he has a significant residual and needs a catheter.

## 2014-03-27 LAB — TYPE AND SCREEN
ABO/RH(D): A POS
Antibody Screen: NEGATIVE
UNIT DIVISION: 0
UNIT DIVISION: 0

## 2014-03-27 LAB — URINE CULTURE
Colony Count: NO GROWTH
Culture: NO GROWTH
SPECIAL REQUESTS: NORMAL

## 2014-03-27 MED ORDER — LEVOFLOXACIN IN D5W 500 MG/100ML IV SOLN
500.0000 mg | INTRAVENOUS | Status: AC
  Administered 2014-03-27: 500 mg via INTRAVENOUS
  Filled 2014-03-27: qty 100

## 2014-03-27 MED ORDER — LEVOFLOXACIN IN D5W 250 MG/50ML IV SOLN
250.0000 mg | INTRAVENOUS | Status: DC
  Administered 2014-03-28: 250 mg via INTRAVENOUS
  Filled 2014-03-27: qty 50

## 2014-03-27 MED ORDER — LEVOFLOXACIN IN D5W 500 MG/100ML IV SOLN: 500.0000 mg | INTRAVENOUS | Status: DC

## 2014-03-27 NOTE — Progress Notes (Signed)
Pharmacy Note - Levaquin  Levaquin 500 mg IV q 24 hrs ordered, however, pt with CrCl ~ 40 ml/min  Will reduce dose of Levaquin to 500 mg IV x 1, then 250 mg IV q 24hrs.  Please contact pharmacy if questions.  Thanks!  Uvaldo Rising, BCPS  Clinical Pharmacist Pager [phone]  03/27/2014 9:13 AM

## 2014-03-27 NOTE — Progress Notes (Addendum)
      [address]       ,[place] [address]             [phone]      4 Days Post-Op Procedure(s) (LRB): TRANSCATHETER AORTIC VALVE REPLACEMENT, TRANSFEMORAL (N/A) INTRAOPERATIVE TRANSESOPHAGEAL ECHOCARDIOGRAM (N/A)  Subjective:  [name] [name] has no new complaints this morning.  He states he is feeling better after his catheter was replaced, but he does not want to be discharged with a foley.  Objective: Vital signs in last 24 hours: Temp:  [97.2 F (36.2 C)-97.9 F (36.6 C)] 97.2 F (36.2 C) (09/05 0442) Pulse Rate:  [62-69] 67 (09/05 0442) Cardiac Rhythm:  [-] Normal sinus rhythm (09/04 2300) Resp:  [18] 18 (09/05 0442) BP: (98-121)/(33-59) 121/42 mmHg (09/05 0442) SpO2:  [94 %] 94 % (09/05 0442) Weight:  [183 lb (83.008 kg)] 183 lb (83.008 kg) (09/05 0442)  Intake/Output from previous day: 09/04 0701 - 09/05 0700 In: 600 [P.O.:600] Out: 2008 [Urine:2008]  General appearance: alert, cooperative and no distress Heart: regular rate and rhythm Lungs: clear to auscultation bilaterally Abdomen: soft, non-tender; bowel sounds normal; no masses,  no organomegaly Extremities: edema trace Wound: clean and dry  Lab Results:  Recent Labs  03/25/14 0500 03/26/14 0352  WBC 11.6* 9.9  HGB 13.3 12.3*  HCT 41.0 37.4*  PLT 81* 81*   BMET:  Recent Labs  03/25/14 0500  NA 143  K 5.2  CL 108  CO2 23  GLUCOSE 122*  BUN 28*  CREATININE 1.32  CALCIUM 9.1    PT/INR: No results found for this basename: LABPROT, INR,  in the last 72 hours ABG    Component Value Date/Time   PHART 7.375 03/23/2014 1508   HCO3 23.7 03/23/2014 1508   TCO2 25 03/23/2014 1508   ACIDBASEDEF 2.0 03/23/2014 1508   O2SAT 91.0 03/23/2014 1508   CBG (last 3)   Recent Labs  03/24/14 1123 03/25/14 0615  GLUCAP 136* 113*    Assessment/Plan: S/P Procedure(s) (LRB): TRANSCATHETER AORTIC VALVE REPLACEMENT, TRANSFEMORAL (N/A) INTRAOPERATIVE TRANSESOPHAGEAL ECHOCARDIOGRAM (N/A)   1. CV- NSR- continue Lopressor, Cozaar, Plavix 2. Pulm- no acute issues, continued use of IS 3. Renal- creatinine at baseline, not on Lasix 4. GU- H/O BPH on Flomax and Proscar, foley replaced yesterday 5. ID- + UTI per dipstick, culture is pending- will need ABX will discuss with staff 6. Dispo- patient stable, + UTI and urinary retention, will need ABX will discuss regimen with staff   LOS: 4 days    [name], [name] 03/27/2014  Surgical incision with some bruising but clean and dry Foley catheter with dark urine-cultures still pending Will remove Foley catheter early a.m. and follow voiding function Cover UTI with Levaquin pending sensitivity patient examined and medical record reviewed,agree with above note. VAN TRIGT [name],[name] 03/27/2014

## 2014-03-27 NOTE — Progress Notes (Signed)
CARDIAC REHAB PHASE I   PRE:  Rate/Rhythm: Sinus 72  BP:  Supine: 122/41       SaO2: 94 Room Air  MODE:  Ambulation: 550 ft   POST:  Rate/Rhythem: 72  BP:    Sitting: 157/58  Recheck 112/60   SaO2: 97% Room Air  1010-1045 Patient ambulated in hallway using rolling walker. Tolerated ambulation well. Patient assisted back to chair with call bell within reach.  Chauntae Hults, Christa See RN

## 2014-03-28 MED ORDER — TAMSULOSIN HCL 0.4 MG PO CAPS: 0.4000 mg | ORAL_CAPSULE | Freq: Every day | ORAL | Status: DC

## 2014-03-28 MED ORDER — LEVOFLOXACIN 250 MG PO TABS: 250.0000 mg | ORAL_TABLET | Freq: Every day | ORAL | Status: DC

## 2014-03-28 MED ORDER — LEVOFLOXACIN IN D5W 250 MG/50ML IV SOLN
250.0000 mg | INTRAVENOUS | Status: DC
  Administered 2014-03-29: 250 mg via INTRAVENOUS
  Filled 2014-03-28: qty 50

## 2014-03-28 NOTE — Progress Notes (Signed)
Pt unable to void bladder scanned results430cc. Called dr. Nils Pyle order given to place foley catheter and for discharge to be cancelled.

## 2014-03-28 NOTE — Progress Notes (Signed)
Patient unable to void bs 414 at noon, has urge, non productive, refuses foley catheter  MD states if  patient unable to void in 10 hr to put foley catheter in and to keep patient for another day

## 2014-03-28 NOTE — Significant Event (Signed)
Patient attempted to void in urinal; was able to void only drops of urine. RN bladder scanned patient again; bladder scan showed 638cc. RN placed a new foley catheter (#14) using sterile technique. Received 500cc of urine back via foley catheter. Education done with patient. Patient verbalized understanding need for foley catheter.

## 2014-03-28 NOTE — Progress Notes (Addendum)
      [address]       [place] [address]             [phone]      5 Days Post-Op Procedure(s) (LRB): TRANSCATHETER AORTIC VALVE REPLACEMENT, TRANSFEMORAL (N/A) INTRAOPERATIVE TRANSESOPHAGEAL ECHOCARDIOGRAM (N/A)  Subjective:  [name] has no complaints this morning.  His foley catheter has already been removed and he is hoping he can void on his own without difficulty.  He also states that if he can void he wants to be discharged home today.  Objective: Vital signs in last 24 hours: Temp:  [97.8 F (36.6 C)-98.2 F (36.8 C)] 98.2 F (36.8 C) (09/06 0423) Pulse Rate:  [65-70] 70 (09/06 0423) Cardiac Rhythm:  [-] Normal sinus rhythm (09/06 0800) Resp:  [17-20] 20 (09/06 0423) BP: (139-144)/(43-56) 144/56 mmHg (09/05 2114) SpO2:  [95 %-98 %] 95 % (09/06 0423) Weight:  [183 lb 10.3 oz (83.3 kg)] 183 lb 10.3 oz (83.3 kg) (09/06 0423)  Intake/Output from previous day: 09/05 0701 - 09/06 0700 In: 723 [P.O.:720; I.V.:3] Out: 770 [Urine:770]  General appearance: alert, cooperative and no distress Heart: regular rate and rhythm Lungs: clear to auscultation bilaterally Abdomen: soft, non-tender; bowel sounds normal; no masses,  no organomegaly Extremities: edema trace Wound: clean and ry  Lab Results:  Recent Labs  03/26/14 0352  WBC 9.9  HGB 12.3*  HCT 37.4*  PLT 81*   BMET: No results found for this basename: NA, K, CL, CO2, GLUCOSE, BUN, CREATININE, CALCIUM,  in the last 72 hours  PT/INR: No results found for this basename: LABPROT, INR,  in the last 72 hours ABG    Component Value Date/Time   PHART 7.375 03/23/2014 1508   HCO3 23.7 03/23/2014 1508   TCO2 25 03/23/2014 1508   ACIDBASEDEF 2.0 03/23/2014 1508   O2SAT 91.0 03/23/2014 1508   CBG (last 3)  No results found for this basename: GLUCAP,  in the last 72 hours  Assessment/Plan: S/P Procedure(s) (LRB): TRANSCATHETER AORTIC VALVE REPLACEMENT, TRANSFEMORAL (N/A) INTRAOPERATIVE  TRANSESOPHAGEAL ECHOCARDIOGRAM (N/A)  1. CV- NSR- continue Lopressor, Cozaar, Plavix 2. Pulm- no acute issues, good use of IS 3. Renal- minimal LE edema, volume status is at baseline, no lasix indicated at this time 4. GU- urinary retention, foley removed early this morning, will see if patient can void, if not will require foley at discharge 5. +UTI per dipstick, culture is negative, will continue levaquin to complete 7 days of therapy 6. Dispo- patient stable, foley out this morning, continue levaquin to complete 7 days of therapy, if able to void will d/c home this afternoon   LOS: 5 days    [name] [name], [name] [name] 03/28/2014  Patient should be ready for discharge after he successfully voids following Foley removal Will dose Levaquin IV today while he is still at hospital for UTI patient examined and medical record reviewed,agree with above note. VAN TRIGT III,Jaki Hammerschmidt 03/28/2014

## 2014-03-29 DIAGNOSIS — Z954 Presence of other heart-valve replacement: Secondary | ICD-10-CM

## 2014-03-29 DIAGNOSIS — I97190 Other postprocedural cardiac functional disturbances following cardiac surgery: Secondary | ICD-10-CM

## 2014-03-29 DIAGNOSIS — I2510 Atherosclerotic heart disease of native coronary artery without angina pectoris: Secondary | ICD-10-CM

## 2014-03-29 LAB — TYPE AND SCREEN
ABO/RH(D): A POS
ANTIBODY SCREEN: POSITIVE
DAT, IgG: NEGATIVE
PT AG Type: POSITIVE
UNIT DIVISION: 0
UNIT DIVISION: 0
Unit division: 0
Unit division: 0

## 2014-03-29 NOTE — Progress Notes (Signed)
Went over discharge instructions with patient and his family.  Patient had no additional questions or concerns related to discharge instructions. IV discontinued, patient taken off the cardiac monitor. Foley bag changed over to leg strap bag. To follow up with urology for removal. [facility] RN set up.  Patient given paper prescriptions to take to pharmacy.  Patient discharged home. Roxan Hockey, RN

## 2014-03-29 NOTE — Progress Notes (Signed)
      [address]       [place] [address]             [phone]      6 Days Post-Op Procedure(s) (LRB): TRANSCATHETER AORTIC VALVE REPLACEMENT, TRANSFEMORAL (N/A) INTRAOPERATIVE TRANSESOPHAGEAL ECHOCARDIOGRAM (N/A)  Subjective:  [name] [name] is unhappy this morning.  He was unable to urinate yesterday and his foley catheter had to be replaced.  He is adamant that he does not want to be discharged with a foley.  I explained to the patient that we can not continue to remove and replace the foley due to increase risk of infection and continued to trauma to the urethral.  When questioned patient about Urologist he admitted that he is retired and he currently does not have one and has not been since his doctor retired.    Objective: Vital signs in last 24 hours: Temp:  [97.5 F (36.4 C)-98.4 F (36.9 C)] 97.5 F (36.4 C) (09/07 0608) Pulse Rate:  [66-75] 72 (09/07 4917) Cardiac Rhythm:  [-] Normal sinus rhythm (09/06 2030) Resp:  [18] 18 (09/07 0608) BP: (120-145)/(46-71) 137/54 mmHg (09/07 0608) SpO2:  [95 %-97 %] 95 % (09/07 0608) Weight:  [183 lb 10.3 oz (83.3 kg)] 183 lb 10.3 oz (83.3 kg) (09/07 9150)  Intake/Output from previous day: 09/06 0701 - 09/07 0700 In: 120 [P.O.:120] Out: 1175 [Urine:1175]  General appearance: alert, cooperative and no distress Heart: regular rate and rhythm Lungs: clear to auscultation bilaterally Abdomen: soft, non-tender; bowel sounds normal; no masses,  no organomegaly Extremities: edema trace Wound: clean and dry  Lab Results: No results found for this basename: WBC, HGB, HCT, PLT,  in the last 72 hours BMET: No results found for this basename: NA, K, CL, CO2, GLUCOSE, BUN, CREATININE, CALCIUM,  in the last 72 hours  PT/INR: No results found for this basename: LABPROT, INR,  in the last 72 hours ABG    Component Value Date/Time   PHART 7.375 03/23/2014 1508   HCO3 23.7 03/23/2014 1508   TCO2 25 03/23/2014 1508   ACIDBASEDEF  2.0 03/23/2014 1508   O2SAT 91.0 03/23/2014 1508   CBG (last 3)  No results found for this basename: GLUCAP,  in the last 72 hours  Assessment/Plan: S/P Procedure(s) (LRB): TRANSCATHETER AORTIC VALVE REPLACEMENT, TRANSFEMORAL (N/A) INTRAOPERATIVE TRANSESOPHAGEAL ECHOCARDIOGRAM (N/A)  1. CV- NSR continue Lopressor, Cozaar, Plavix 2. Pulm- no acute issue, good use of IS 3. Renal- creatinine stable, weight remains stable, no Lasix at this time 4. GU- continued urinary retention post foley removal, foley catheter was replaced last night 5. + UTI- continue Levaquin 6. Dispo- patient with continued urinary retention.  Patient does not want a foley at discharge.  I explained to the patient that this is likely an avoidable situation at this point.  He would benefit from Urology consult as the patient currently does not have a Urologist.  He would like to leave foley in today and possibly attempt removal again tomorrow.     LOS: 6 days    Ellwood Handler 03/29/2014

## 2014-03-29 NOTE — Progress Notes (Signed)
Patient ambulated in the hallway with family, independently tolerated walk well. Walked approx. 250 feet. Roxan Hockey, RN

## 2014-03-29 NOTE — Progress Notes (Signed)
     Subjective:  No CP/SOB, major issue is urinary retention, foley in place   Objective:  Temp:  [97.5 F (36.4 C)-98.4 F (36.9 C)] 97.5 F (36.4 C) (09/07 8325) Pulse Rate:  [66-75] 72 (09/07 0608) Resp:  [18] 18 (09/07 0608) BP: (120-145)/(46-71) 137/54 mmHg (09/07 0608) SpO2:  [95 %-97 %] 95 % (09/07 0608) Weight:  [183 lb 10.3 oz (83.3 kg)] 183 lb 10.3 oz (83.3 kg) (09/07 4982) Weight change: 0 lb (0 kg)  Intake/Output from previous day: 09/06 0701 - 09/07 0700 In: 120 [P.O.:120] Out: 1175 [Urine:1175]  Intake/Output from this shift:    Physical Exam: General appearance: alert and no distress Neck: no adenopathy, no carotid bruit, no JVD, supple, symmetrical, trachea midline and thyroid not enlarged, symmetric, no tenderness/mass/nodules Lungs: clear to auscultation bilaterally Heart: regular rate and rhythm, S1, S2 normal, no murmur, click, rub or gallop Extremities: extremities normal, atraumatic, no cyanosis or edema  Lab Results: No results found for this or any previous visit (from the past 48 hour(s)).  Imaging: Imaging results have been reviewed  Assessment/Plan:   1. Principal Problem: 2.   S/P TAVR (transcatheter aortic valve replacement) 3. Active Problems: 4.   Aortic valve disorders 5.   Time Spent Directly with Patient:  20 minutes  Length of Stay:  LOS: 6 days   POD # 5 TAVR valve in valve for prosthetic valve AI, symptomatic. Excellent result. NSR. Left groin OK. Exam benign. Labs ok except for decrease plts apparently expected per Dr. [facility] note.2D echo looks good (no AI).  Major issue is urinary retention now with foley in place on ATBX. OK to go home from Cards point of view. Will need early OP urology F/U. Pt already has an OP appointment with Dr Billee Cashing and Truitt Merle on Friday.   Lorretta Harp 03/29/2014, 8:39 AM

## 2014-03-30 ENCOUNTER — Telehealth: Payer: Self-pay | Admitting: *Deleted

## 2014-03-30 NOTE — Telephone Encounter (Signed)
Message copied by Tamsen Snider on Tue Mar 30, 2014 10:27 AM ------      Message from: Burtis Junes      Created: Sun Mar 28, 2014  7:25 PM       Andee Poles,      Will you add him on for a TOC on Friday.             Cecille Rubin      ----- Message -----         From: Sherren Mocha, MD         Sent: 03/25/2014   9:26 AM           To: Lurline Del, RN, Burtis Junes, NP, #            Cecille Rubin - wonder if you can see Phil for 1 week TOC visit next Friday? I'm in office that day. thx Ronalee Belts       ------

## 2014-03-30 NOTE — Telephone Encounter (Signed)
S/w pt and pt's daughter confirmed appointment for Friday, 9/11 @ 2:00 pm

## 2014-03-31 ENCOUNTER — Telehealth: Payer: Self-pay | Admitting: Cardiovascular Disease

## 2014-03-31 NOTE — Telephone Encounter (Signed)
New message      Need to know date of heart valve surgery

## 2014-03-31 NOTE — Telephone Encounter (Signed)
**Note De-Identified [name]** [name] [name] is advised that the pts surgery was on 03/23/14. She verbalized understanding.

## 2014-04-02 ENCOUNTER — Encounter: Payer: Self-pay | Admitting: Nurse Practitioner

## 2014-04-02 ENCOUNTER — Ambulatory Visit (INDEPENDENT_AMBULATORY_CARE_PROVIDER_SITE_OTHER): Payer: Medicare Other | Admitting: Nurse Practitioner

## 2014-04-02 ENCOUNTER — Other Ambulatory Visit: Payer: Self-pay | Admitting: Nurse Practitioner

## 2014-04-02 VITALS — BP 110/80 | HR 60 | Wt 183.0 lb

## 2014-04-02 DIAGNOSIS — Z952 Presence of prosthetic heart valve: Secondary | ICD-10-CM

## 2014-04-02 DIAGNOSIS — I2510 Atherosclerotic heart disease of native coronary artery without angina pectoris: Secondary | ICD-10-CM

## 2014-04-02 DIAGNOSIS — Z954 Presence of other heart-valve replacement: Secondary | ICD-10-CM

## 2014-04-02 DIAGNOSIS — R339 Retention of urine, unspecified: Secondary | ICD-10-CM

## 2014-04-02 LAB — CBC
HCT: 37.3 % — ABNORMAL LOW (ref 39.0–52.0)
Hemoglobin: 12.4 g/dL — ABNORMAL LOW (ref 13.0–17.0)
MCHC: 33.2 g/dL (ref 30.0–36.0)
MCV: 97.3 fl (ref 78.0–100.0)
Platelets: 147 10*3/uL — ABNORMAL LOW (ref 150.0–400.0)
RBC: 3.84 Mil/uL — ABNORMAL LOW (ref 4.22–5.81)
RDW: 14.2 % (ref 11.5–15.5)
WBC: 8.2 10*3/uL (ref 4.0–10.5)

## 2014-04-02 LAB — BASIC METABOLIC PANEL
BUN: 20 mg/dL (ref 6–23)
CO2: 27 mEq/L (ref 19–32)
Calcium: 8.7 mg/dL (ref 8.4–10.5)
Chloride: 106 mEq/L (ref 96–112)
Creatinine, Ser: 1.2 mg/dL (ref 0.4–1.5)
GFR: 58.93 mL/min — ABNORMAL LOW (ref >60.00)
Glucose, Bld: 95 mg/dL (ref 70–99)
Potassium: 4.4 mEq/L (ref 3.5–5.1)
Sodium: 139 mEq/L (ref 135–145)

## 2014-04-02 MED ORDER — FUROSEMIDE 40 MG PO TABS: 20.0000 mg | ORAL_TABLET | Freq: Every day | ORAL | Status: DC

## 2014-04-02 NOTE — Patient Instructions (Addendum)
Stay on your current medicines but you can take your Lasix and potassium twice a day for the next one week - then back to just once a day after that  We will check lab today  We will get you a visit with Urology (urinary retention post op with foley in place) at [facility] - if no availability - patient is willing to go out of town  Keep your planned follow up with Dr. Burt Knack and your echo appointment  You do not need to see Dr. Cyndia Bent - I will send him a message  Call the [place] office at [phone] if you have any questions, problems or concerns.

## 2014-04-02 NOTE — Progress Notes (Signed)
[name] [name] Date of Birth: 04/01/1923 Medical Record #[id number]  History of Present Illness: [name] [name] is seen back today for a follow up visit. Seen for Dr. Burt Knack. Former patient of Dr. Susa Simmonds. Now 80 years of age. Has known CAD with remote inferior MI, past stent to the LCX and has a totally occluded RCA. Had prior AVR by Dr. Cyndia Bent in 2003, past AAA repair in 2004, PVD, past CVA, HLD, BPH and hypothyroidism.   Seen back in October of 2014 - taking care of his wife with advanced dementia. Updated his echo and noted perivalvular/central leak on his valve - tried to manage conservatively, but failed and was here back in January with heart failure. Saw Dr. Burt Knack. Placed on lasix. Tuned up VERY nicely initially with medicines but then was considered for TAVR (valve in valve) due to failing on his medical therapy. His wife's dementia has really gotten the best of him but [name] [name] has been resistant to having outside help.   Most recently admitted for his TAVR - had left transfemoral approach with a #82mm Edwards Sapient XT placed. Post op course complicated by urinary retention and he was sent home with a foley. Prior home medicines were restarted.   Comes in today. Here with [name] [name], his son. [name] [name] is quite frustrated. His father has once again sent the help away again. From a cardiac standpoint, he is doing ok. His breathing is fine. Does have some pedal edema. Wondering if he should be on more Lasix. Groin incisions are ok. More upset about his foley catheter. Can't get an appointment with Urology. Almost finished with antibiotics. No chest pain. Wants to drive. Weight stable at home. [facility] is coming to see him and have given him good reports.   Current Outpatient Prescriptions  Medication Sig Dispense Refill  . aspirin 81 MG tablet Take 81 mg by mouth daily.      Marland Kitchen atorvastatin (LIPITOR) 80 MG tablet Take 1 tablet (80 mg total) by mouth daily.  90 tablet  3  . B Complex-C (SUPER B  COMPLEX) TABS Take 1 tablet by mouth daily.      . clopidogrel (PLAVIX) 75 MG tablet Take 1 tablet (75 mg total) by mouth daily with breakfast.  30 tablet  1  . ezetimibe (ZETIA) 10 MG tablet Take 1 tablet (10 mg total) by mouth daily.  90 tablet  3  . finasteride (PROSCAR) 5 MG tablet Take 1 tablet (5 mg total) by mouth daily.  90 tablet  3  . furosemide (LASIX) 40 MG tablet Take 0.5 tablets (20 mg total) by mouth daily. Take extra half tablet ( 20 mg) for weight gain up to 2-3 pounds over night  30 tablet  3  . levofloxacin (LEVAQUIN) 250 MG tablet Take 1 tablet (250 mg total) by mouth daily. For 5 Days  5 tablet  0  . levothyroxine (SYNTHROID, LEVOTHROID) 50 MCG tablet Take 1 tablet (50 mcg total) by mouth daily.  90 tablet  3  . losartan (COZAAR) 50 MG tablet Take 1 tablet (50 mg total) by mouth daily.  90 tablet  3  . metoprolol tartrate (LOPRESSOR) 25 MG tablet Take 0.5 tablets (12.5 mg total) by mouth 2 (two) times daily.  30 tablet  1  . PARoxetine (PAXIL) 20 MG tablet Take 0.5 tablets (10 mg total) by mouth every morning.  45 tablet  3  . tamsulosin (FLOMAX) 0.4 MG CAPS capsule Take 1 capsule (0.4 mg total) by mouth  daily.  30 capsule  3  . traMADol (ULTRAM) 50 MG tablet Take 1 tablet (50 mg total) by mouth every 6 (six) hours as needed for moderate pain.  30 tablet  0  . potassium chloride SA (K-DUR,KLOR-CON) 20 MEQ tablet Take 10 mEq by mouth once.        No current facility-administered medications for this visit.    Allergies  Allergen Reactions  . Amoxicillin-Pot Clavulanate Itching  . Morphine And Related Nausea And Vomiting  . Penicillins Hives    Past Medical History  Diagnosis Date  . CVA (cerebral infarction) 12/09  . CAD (coronary artery disease)     stents in [place] and has a totally occluded RCA  . HTN (hypertension)   . Right iliac artery stenosis   . BPH (benign prostatic hyperplasia)   . Aortic stenosis     s/p AVR in 2003, bovine  . Diverticulosis   . AAA  (abdominal aortic aneurysm)     s/p endovascular repair 2004  . Hyperlipemia   . Thyroid disease   . Glaucoma     recently told by Dr Janyth Contes he did not have it  . BCC (basal cell carcinoma) 09/17/2012  . Personal history of poliomyelitis 09/17/2012    In childhood  . Sleeping difficulties   . Leaky heart valve   . Chicken pox   . Polio   . Mumps   . Measles   . Aortic insufficiency     Prosthetic valve dysfunction  . Prosthetic valve dysfunction     Aortic stenosis and insufficiency   . Myocardial infarction   . Shortness of breath     exertion, orthopnea  . Stroke     no residual  . Pneumonia     hx of  . Depression   . Arthritis   . 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 Aortic valve replacement  2003    #49mm pericardial valve   . Abdominal aortic aneurysm repair  2004    s/p endovascular repiar of AAA in 2004  . Coronary stent placement    . Tonsillectomy    . Wisdom tooth extraction    . Appendectomy    . Tee without cardioversion N/A 09/02/2013    Procedure: TRANSESOPHAGEAL ECHOCARDIOGRAM (TEE);  Surgeon: Dorothy Spark, MD;  Location: [facility];  Service: Cardiovascular;  Laterality: N/A;  . Cardiac catheterization    . Coronary angioplasty    . Transcatheter aortic valve replacement, transfemoral N/A 03/23/2014    Procedure: TRANSCATHETER AORTIC VALVE REPLACEMENT, TRANSFEMORAL;  Surgeon: Sherren Mocha, MD;  Location: [facility];  Service: Open Heart Surgery;  Laterality: N/A;  valve in valve TAVR  . Intraoperative transesophageal echocardiogram N/A 03/23/2014    Procedure: INTRAOPERATIVE TRANSESOPHAGEAL ECHOCARDIOGRAM;  Surgeon: Sherren Mocha, MD;  Location[facility];  Service: Open Heart Surgery;  Laterality: N/A;    History  Smoking status  . Former Smoker -- 0.50 packs/day for 30 years  .  Types: Cigarettes  . Quit date: 10/04/1980  Smokeless tobacco  . Not on file    History  Alcohol Use  . 4.2 oz/week  . 7 Glasses of wine per week    Comment: cocktails/night    Family History  Problem Relation Age of Onset  . Colon cancer Mother   . Aneurysm Father   .  Heart attack Brother     CHF  . Prostate cancer Brother     Review of Systems: The review of systems is per the HPI.  All other systems were reviewed and are negative.  Physical Exam: BP 110/80  Pulse 60  Wt 183 lb (83.008 kg)  SpO2 92% Patient is alert - little grumpy today but in no acute distress. Skin is warm and dry. Color is normal.  HEENT is unremarkable. Normocephalic/atraumatic. PERRL. Sclera are nonicteric. Neck is supple. No masses. No JVD. Lungs are clear. Cardiac exam shows a regular rate and rhythm. Very soft outflow murmur noted. Abdomen is soft. Extremities are with pedal edema. Gait and ROM are intact. No gross neurologic deficits noted.  Wt Readings from Last 3 Encounters:  04/02/14 183 lb (83.008 kg)  03/29/14 183 lb 10.3 oz (83.3 kg)  03/29/14 183 lb 10.3 oz (83.3 kg)    LABORATORY DATA/PROCEDURES:  Lab Results  Component Value Date   WBC 9.9 03/26/2014   HGB 12.3* 03/26/2014   HCT 37.4* 03/26/2014   PLT 81* 03/26/2014   GLUCOSE 122* 03/25/2014   CHOL 130 05/01/2013   TRIG 99.0 05/01/2013   HDL 40.70 05/01/2013   LDLCALC 70 05/01/2013   ALT 19 03/19/2014   AST 27 03/19/2014   NA 143 03/25/2014   K 5.2 03/25/2014   CL 108 03/25/2014   CREATININE 1.32 03/25/2014   BUN 28* 03/25/2014   CO2 23 03/25/2014   TSH 5.00 08/21/2013   INR 1.25 03/23/2014   HGBA1C 6.3* 03/19/2014    BNP (last 3 results)  Recent Labs  08/21/13 1209 10/21/13 1237 01/01/14 1155  PROBNP 556.0* 353.0* 286.0*     Assessment / Plan: 1. S/P left transfemoral TAVR -  Doing well. Check follow up labs - has upcoming echo and follow up with Dr. Burt Knack already in place.   2. Post op urinary retention -  Needs to get to  urology for further disposition. Almost finished with his antibiotics. He is willing to go out of town if necessary  3. CAD - managed medically  4. Prior stroke  5. Situational stress - really not addressed today. Son is "totally put out with the situation". This was not addressed today.  Wounds checked by Dr. Burt Knack - looks fine - no need to see Dr. Cyndia Bent at this time.   Patient is agreeable to this plan and will call if any problems develop in the interim.   Burtis Junes, RN, [place] [address] [place], [place]  [phone]

## 2014-04-07 ENCOUNTER — Ambulatory Visit: Payer: Medicare Other | Admitting: Surgery

## 2014-04-12 ENCOUNTER — Other Ambulatory Visit (HOSPITAL_COMMUNITY): Payer: Medicare Other

## 2014-04-12 ENCOUNTER — Ambulatory Visit: Payer: Medicare Other | Admitting: Family

## 2014-04-16 ENCOUNTER — Ambulatory Visit (INDEPENDENT_AMBULATORY_CARE_PROVIDER_SITE_OTHER): Payer: Medicare Other | Admitting: Cardiovascular Disease

## 2014-04-16 ENCOUNTER — Other Ambulatory Visit: Payer: Self-pay

## 2014-04-16 ENCOUNTER — Ambulatory Visit (HOSPITAL_COMMUNITY): Payer: Medicare Other | Attending: Cardiology | Admitting: Radiology

## 2014-04-16 ENCOUNTER — Encounter: Payer: Self-pay | Admitting: Cardiovascular Disease

## 2014-04-16 VITALS — BP 122/62 | HR 60 | Ht 65.0 in | Wt 177.1 lb

## 2014-04-16 DIAGNOSIS — Z87891 Personal history of nicotine dependence: Secondary | ICD-10-CM | POA: Diagnosis not present

## 2014-04-16 DIAGNOSIS — I359 Nonrheumatic aortic valve disorder, unspecified: Secondary | ICD-10-CM | POA: Diagnosis present

## 2014-04-16 DIAGNOSIS — Z953 Presence of xenogenic heart valve: Secondary | ICD-10-CM

## 2014-04-16 DIAGNOSIS — I2510 Atherosclerotic heart disease of native coronary artery without angina pectoris: Secondary | ICD-10-CM

## 2014-04-16 NOTE — Progress Notes (Signed)
Echocardiogram performed.  

## 2014-04-16 NOTE — Patient Instructions (Signed)
Your physician recommends that you continue on your current medications as directed. Please refer to the Current Medication list given to you today.  Your physician recommends that you schedule a follow-up appointment in: 2 months with Truitt Merle, NP

## 2014-04-17 ENCOUNTER — Encounter: Payer: Self-pay | Admitting: Cardiovascular Disease

## 2014-04-17 NOTE — Progress Notes (Signed)
HPI:  [name] [name] returns for his 30 day post TAVR visit. He is an 80 year old gentleman with coronary and aortic valve disease. In 2003 he underwent bioprosthetic aortic valve replacement. Has also undergone stenting of the left circumflex and has a known chronic occlusion of a small RCA. He developed severe bioprosthetic valve aortic insufficiency and was ultimately treated with valve and valve TAVR using a 23 mm Edwards Sapian XT valve via a transfemoral approach on 03/23/2014. His post-op course was uncomplicated with the exception of urinary retention requiring placement of a foley catheter. He has now seen urology and has undergone cystoscopy and removal of the foley.   The patient is doing very well. His orthopnea and shortness of breath have resolved. He specifically denies chest pain, lightheadedness, or syncope. He is here with his son, [name] [name], today. The patient has been modestly active and he is eager to return to full activities.  Outpatient Encounter Prescriptions as of 04/16/2014  Medication Sig  . aspirin 81 MG tablet Take 81 mg by mouth daily.  Marland Kitchen atorvastatin (LIPITOR) 80 MG tablet Take 1 tablet (80 mg total) by mouth daily.  . B Complex-C (SUPER B COMPLEX) TABS Take 1 tablet by mouth daily.  . clopidogrel (PLAVIX) 75 MG tablet Take 1 tablet (75 mg total) by mouth daily with breakfast.  . ezetimibe (ZETIA) 10 MG tablet Take 1 tablet (10 mg total) by mouth daily.  . finasteride (PROSCAR) 5 MG tablet Take 1 tablet (5 mg total) by mouth daily.  . furosemide (LASIX) 40 MG tablet Take 0.5 tablets (20 mg total) by mouth daily. May take 1/2 tablet twice a day for one week, then back to just 1/2 pill per day  . levofloxacin (LEVAQUIN) 250 MG tablet Take 1 tablet (250 mg total) by mouth daily. For 5 Days  . levothyroxine (SYNTHROID, LEVOTHROID) 50 MCG tablet Take 1 tablet (50 mcg total) by mouth daily.  Marland Kitchen losartan (COZAAR) 50 MG tablet Take 1 tablet (50 mg total) by mouth daily.  . metoprolol  tartrate (LOPRESSOR) 25 MG tablet Take 0.5 tablets (12.5 mg total) by mouth 2 (two) times daily.  Marland Kitchen PARoxetine (PAXIL) 20 MG tablet Take 0.5 tablets (10 mg total) by mouth every morning.  . potassium chloride SA (K-DUR,KLOR-CON) 20 MEQ tablet Take 10 mEq by mouth once.   . tamsulosin (FLOMAX) 0.4 MG CAPS capsule Take 1 capsule (0.4 mg total) by mouth daily.  . [DISCONTINUED] traMADol (ULTRAM) 50 MG tablet Take 1 tablet (50 mg total) by mouth every 6 (six) hours as needed for moderate pain.    Allergies  Allergen Reactions  . Amoxicillin-Pot Clavulanate Itching  . Morphine And Related Nausea And Vomiting  . Penicillins Hives    Past Medical History  Diagnosis Date  . CVA (cerebral infarction) 12/09  . CAD (coronary artery disease)     stents in [place] and has a totally occluded RCA  . HTN (hypertension)   . Right iliac artery stenosis   . BPH (benign prostatic hyperplasia)   . Aortic stenosis     s/p AVR in 2003, bovine  . Diverticulosis   . AAA (abdominal aortic aneurysm)     s/p endovascular repair 2004  . Hyperlipemia   . Thyroid disease   . Glaucoma     recently told by Dr Janyth Contes he did not have it  . BCC (basal cell carcinoma) 09/17/2012  . Personal history of poliomyelitis 09/17/2012    In childhood  . Sleeping difficulties   .  Leaky heart valve   . Chicken pox   . Polio   . Mumps   . Measles   . Aortic insufficiency     Prosthetic valve dysfunction  . Prosthetic valve dysfunction     Aortic stenosis and insufficiency   . Myocardial infarction   . Shortness of breath     exertion, orthopnea  . Stroke     no residual  . Pneumonia     hx of  . Depression   . Arthritis   . 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    23 mm Edwards Sapien transcatheter heart valve placed via open left transfemoral approach    ROS: Positive for urinary retention and left shoulder pain, otherwise negative except as per HPI  BP 122/62  Pulse 60  Ht 5\' 5"  (1.651  m)  Wt 177 lb 1.9 oz (80.341 kg)  BMI 29.47 kg/m2  PHYSICAL EXAM: Pt is alert and oriented, pleasant elderly male in NAD HEENT: normal Neck: JVP - normal Lungs: CTA bilaterally CV: RRR with grade 2/6 systolic murmur at the RUSB, early-mid peaking Abd: soft, NT, Positive BS, no hepatomegaly Ext: no C/C/E, distal pulses intact and equal Skin: warm/dry no rash  2D Echo: Study Conclusions  - Left ventricle: The cavity size was normal. There was moderate concentric hypertrophy. Systolic function was normal. The estimated ejection fraction was in the range of 50% to 55%. Wall motion was normal; there were no regional wall motion abnormalities. Doppler parameters are consistent with abnormal left ventricular relaxation (grade 1 diastolic dysfunction). There was no evidence of elevated ventricular filling pressure by Doppler parameters. - Aortic valve: Transcatheter Edward-SAPIEN valve sits well in the aortic position. There is trace central aortic insufficiency and no paravalvular leak. Transaortic gradient are elevated peak 59 mmHg, mean 40 mmHg. There was trivial regurgitation. - Mitral valve: Structurally normal valve. There was mild regurgitation. - Left atrium: The atrium was moderately dilated. - Right ventricle: Systolic function was normal. - Right atrium: The atrium was normal in size. - Tricuspid valve: There was mild regurgitation. - Pulmonary arteries: Systolic pressure was within the normal range. - Pericardium, extracardiac: There was no pericardial effusion.  Impressions:  - When compared to the prior exam from 10/25/8183 LVEF systolic function is improved, now 50-55%, previously 35-40%. Transaortic gradients are elevated and higher than on the last exam, mean 40 mmHg, peak 66mmHg (previously mean 97mmHg, peak 40 mmHg).  ASSESSMENT AND PLAN: 1. Aortic valve disease status post TAVR. Clinically the patient is doing very well, now New York Heart Association  functional class I. He does have elevated transaortic valve gradients. I personally reviewed his echo images. His LV function has normalized and in fact he has very vigorous LV systolic function now. I suspect the combination of his vigorous LV function and valve in valve (23 Sapien XT inside of 21 mm Perimount) account for the elevated gradient. Will plan on a repeat echocardiogram in 6 months. He should continue on lifelong ASA 81 mg and plavix x 6 months.  2. CAD - stable without angina.   3. HTN - controlled on current Rx.   Dispo: f/u Truitt Merle 2 months.   Sherren Mocha MD 04/17/2014 11:05 PM

## 2014-04-19 ENCOUNTER — Telehealth: Payer: Self-pay | Admitting: Surgery

## 2014-04-19 NOTE — Telephone Encounter (Signed)
Message copied by Rufina Falco on Mon Apr 19, 2014 11:08 AM ------      Message from: Viann Fish      Created: Mon Apr 19, 2014 10:54 AM      Regarding: RE: EVAR follow up appointment       Thank you Lamar Sprinkles            ----- Message -----         From: Rufina Falco         Sent: 04/19/2014  10:27 AM           To: Sharmon Leyden Nickel, NP      Subject: EVAR follow up appointment                               I spoke with [name] [name] this morning to reschedule his EVAR follow-up to October 2015.   He stated Dr. Copper will be reaching out to you regarding [place] not scanning [name] [name] this year but waiting until 2016 to repeat his EVAR exam.   Dr. Copper while doing an other procedure took a look at his EVAR and stated it looked good and there was no need to repeat.   I put [name] [name] on the schedule for 2016 and mailed him a reminder letter.           ------

## 2014-04-21 DIAGNOSIS — Z48812 Encounter for surgical aftercare following surgery on the circulatory system: Secondary | ICD-10-CM

## 2014-06-21 ENCOUNTER — Ambulatory Visit: Payer: Medicare Other | Admitting: Cardiovascular Disease

## 2014-06-22 ENCOUNTER — Encounter: Payer: Self-pay | Admitting: Nurse Practitioner

## 2014-06-22 ENCOUNTER — Ambulatory Visit (INDEPENDENT_AMBULATORY_CARE_PROVIDER_SITE_OTHER): Payer: Medicare Other | Admitting: Nurse Practitioner

## 2014-06-22 VITALS — BP 120/80 | HR 76 | Ht 66.0 in | Wt 179.1 lb

## 2014-06-22 DIAGNOSIS — I2510 Atherosclerotic heart disease of native coronary artery without angina pectoris: Secondary | ICD-10-CM

## 2014-06-22 DIAGNOSIS — Z952 Presence of prosthetic heart valve: Secondary | ICD-10-CM

## 2014-06-22 DIAGNOSIS — Z954 Presence of other heart-valve replacement: Secondary | ICD-10-CM

## 2014-06-22 MED ORDER — CLOPIDOGREL BISULFATE 75 MG PO TABS: 75.0000 mg | ORAL_TABLET | Freq: Every day | ORAL | Status: DC

## 2014-06-22 MED ORDER — FUROSEMIDE 40 MG PO TABS: 20.0000 mg | ORAL_TABLET | Freq: Every day | ORAL | Status: DC | PRN

## 2014-06-22 MED ORDER — METOPROLOL TARTRATE 25 MG PO TABS: 12.5000 mg | ORAL_TABLET | Freq: Two times a day (BID) | ORAL | Status: DC

## 2014-06-22 NOTE — Patient Instructions (Addendum)
Stay on your current medicines  Get back on your Plavix - you have to take this for 3 more months - until you are seen back  I have refilled the Metoprolol today  I am changing the Lasix to "just as needed" - take for swelling, weight gain, etc. Take the potassium only if you take the Lasix  See Dr. Burt Knack in 3 months.   Make a visit with Dr. Jacelyn Grip to discuss your shoulder.   Call the [place] office at [phone] if you have any questions, problems or concerns.

## 2014-06-22 NOTE — Progress Notes (Signed)
[name] [name] Date of Birth: [birth year] Medical Record #[id number]  History of Present Illness: [name] [name] is seen back today for a follow up visit. This is a 3 month check. Seen for Dr. Burt Knack. Former patient of Dr. Susa Simmonds. Now 80 years of age. Has known CAD with remote inferior MI, past stent to the LCX and has a totally occluded RCA. Had prior AVR by Dr. Cyndia Bent in 2003, past AAA repair in 2004, PVD, past CVA, HLD, BPH and hypothyroidism.   Seen back in October of 2014 - taking care of his wife with advanced dementia. Updated his echo and noted perivalvular/central leak on his valve - tried to manage conservatively, but failed and was here back in January with heart failure. Saw Dr. Burt Knack. Placed on lasix. Tuned up VERY nicely initially with medicines but then proceed on with TAVR (valve in valve) due to failing on his medical therapy. His wife's dementia has really gotten the best of him and [name] [name] has been resistant to having outside help.   Proceed on with TAVR - had left transfemoral approach with a #84mm Edwards Sapient XT placed. Post op course complicated by urinary retention and he was sent home with a foley. Prior home medicines were restarted.   I last saw him in September for his post op visit from his TAVR - he was doing ok. [name] [name] was pretty frustrated at his father's resistance to get help in the home for his demented mom. Dr. Burt Knack saw him later in September with an echo - this was stable.   Comes in today. Here with [name] [name], his son. Doing very well. Breathing is good. No chest pain. Still pretty frustrated with his wife's dementia - always a contentious issue. Everything going well. Urinating without issue. Foley out - saw urology in [facility]. Has stopped his Flomax. Ran out of Plavix. Off metoprolol. Says he has been calling here to our "cell phone". Weight is stable.   Current Outpatient Prescriptions  Medication Sig Dispense Refill  . aspirin 81 MG tablet Take 81 mg by mouth daily.     Marland Kitchen atorvastatin (LIPITOR) 80 MG tablet Take 1 tablet (80 mg total) by mouth daily. 90 tablet 3  . B Complex-C (SUPER B COMPLEX) TABS Take 1 tablet by mouth daily.    . clopidogrel (PLAVIX) 75 MG tablet Take 1 tablet (75 mg total) by mouth daily with breakfast. 30 tablet 1  . ezetimibe (ZETIA) 10 MG tablet Take 1 tablet (10 mg total) by mouth daily. 90 tablet 3  . finasteride (PROSCAR) 5 MG tablet Take 1 tablet (5 mg total) by mouth daily. 90 tablet 3  . furosemide (LASIX) 40 MG tablet Take 0.5 tablets (20 mg total) by mouth daily. May take 1/2 tablet twice a day for one week, then back to just 1/2 pill per day 60 tablet 3  . levothyroxine (SYNTHROID, LEVOTHROID) 50 MCG tablet Take 1 tablet (50 mcg total) by mouth daily. 90 tablet 3  . losartan (COZAAR) 50 MG tablet Take 1 tablet (50 mg total) by mouth daily. 90 tablet 3  . metoprolol tartrate (LOPRESSOR) 25 MG tablet Take 0.5 tablets (12.5 mg total) by mouth 2 (two) times daily. 30 tablet 1  . PARoxetine (PAXIL) 20 MG tablet Take 0.5 tablets (10 mg total) by mouth every morning. 45 tablet 3  . potassium chloride SA (K-DUR,KLOR-CON) 20 MEQ tablet Take 20 mEq by mouth once. With lasix     No current facility-administered medications for this visit.  Allergies  Allergen Reactions  . Amoxicillin-Pot Clavulanate Itching  . Morphine And Related Nausea And Vomiting  . Penicillins Hives    Past Medical History  Diagnosis Date  . CVA (cerebral infarction) 12/09  . CAD (coronary artery disease)     stents in [place] and has a totally occluded RCA  . HTN (hypertension)   . Right iliac artery stenosis   . BPH (benign prostatic hyperplasia)   . Aortic stenosis     s/p AVR in 2003, bovine  . Diverticulosis   . AAA (abdominal aortic aneurysm)     s/p endovascular repair 2004  . Hyperlipemia   . Thyroid disease   . Glaucoma     recently told by Dr Janyth Contes he did not have it  . BCC (basal cell carcinoma) 09/17/2012  . Personal history of  poliomyelitis 09/17/2012    In childhood  . Sleeping difficulties   . Leaky heart valve   . Chicken pox   . Polio   . Mumps   . Measles   . Aortic insufficiency     Prosthetic valve dysfunction  . Prosthetic valve dysfunction     Aortic stenosis and insufficiency   . Myocardial infarction   . Shortness of breath     exertion, orthopnea  . Stroke     no residual  . Pneumonia     hx of  . Depression   . Arthritis   . 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 Aortic valve replacement  2003    #34mm pericardial valve   . Abdominal aortic aneurysm repair  2004    s/p endovascular repiar of AAA in 2004  . Coronary stent placement    . Tonsillectomy    . Wisdom tooth extraction    . Appendectomy    . Tee without cardioversion N/A 09/02/2013    Procedure: TRANSESOPHAGEAL ECHOCARDIOGRAM (TEE);  Surgeon: Dorothy Spark, MD;  Location: [place];  Service: Cardiovascular;  Laterality: N/A;  . Cardiac catheterization    . Coronary angioplasty    . Transcatheter aortic valve replacement, transfemoral N/A 03/23/2014    Procedure: TRANSCATHETER AORTIC VALVE REPLACEMENT, TRANSFEMORAL;  Surgeon: Sherren Mocha, MD;  Location: [place];  Service: Open Heart Surgery;  Laterality: N/A;  valve in valve TAVR  . Intraoperative transesophageal echocardiogram N/A 03/23/2014    Procedure: INTRAOPERATIVE TRANSESOPHAGEAL ECHOCARDIOGRAM;  Surgeon: Sherren Mocha, MD;  Location: [facility] OR;  Service: Open Heart Surgery;  Laterality: N/A;    History  Smoking status  . Former Smoker -- 0.50 packs/day for 30 years  . Types: Cigarettes  . Quit date: 10/04/1980  Smokeless tobacco  . Not on file    History  Alcohol Use  . 4.2 oz/week  . 7 Glasses of wine per week    Comment: cocktails/night    Family History  Problem Relation Age of  Onset  . Colon cancer Mother   . Aneurysm Father   . Heart attack Brother     CHF  . Prostate cancer Brother     Review of Systems: The review of systems is per the HPI.  All other systems were reviewed and are negative.  Physical Exam: BP 120/80 mmHg  Pulse 76  Ht 5\' 6"  (1.676 m)  Wt 179 lb 1.9 oz (81.248 kg)  BMI 28.92 kg/m2  SpO2 94%  Patient is alert and in no acute distress. Pretty ornery today. Skin is warm and dry. Color is normal.  HEENT is unremarkable. Normocephalic/atraumatic. PERRL. Sclera are nonicteric. Neck is supple. No masses. No JVD. Lungs are clear. Cardiac exam shows a regular rate and rhythm. No real murmur noted. Abdomen is soft. Extremities are without edema. Gait and ROM are intact. No gross neurologic deficits noted.  Wt Readings from Last 3 Encounters:  06/22/14 179 lb 1.9 oz (81.248 kg)  04/16/14 177 lb 1.9 oz (80.341 kg)  04/02/14 183 lb (83.008 kg)    LABORATORY DATA/PROCEDURES:  Lab Results  Component Value Date   WBC 8.2 04/02/2014   HGB 12.4* 04/02/2014   HCT 37.3* 04/02/2014   PLT 147.0* 04/02/2014   GLUCOSE 95 04/02/2014   CHOL 130 05/01/2013   TRIG 99.0 05/01/2013   HDL 40.70 05/01/2013   LDLCALC 70 05/01/2013   ALT 19 03/19/2014   AST 27 03/19/2014   NA 139 04/02/2014   K 4.4 04/02/2014   CL 106 04/02/2014   CREATININE 1.2 04/02/2014   BUN 20 04/02/2014   CO2 27 04/02/2014   TSH 5.00 08/21/2013   INR 1.25 03/23/2014   HGBA1C 6.3* 03/19/2014    BNP (last 3 results)  Recent Labs  08/21/13 1209 10/21/13 1237 01/01/14 1155  PROBNP 556.0* 353.0* 286.0*    Echo Study Conclusions from September 2015  - Left ventricle: The cavity size was normal. There was moderate concentric hypertrophy. Systolic function was normal. The estimated ejection fraction was in the range of 50% to 55%. Wall motion was normal; there were no regional wall motion abnormalities. Doppler parameters are consistent with abnormal left  ventricular relaxation (grade 1 diastolic dysfunction). There was no evidence of elevated ventricular filling pressure by Doppler parameters. - Aortic valve: Transcatheter Edward-SAPIEN valve sits well in the aortic position. There is trace central aortic insufficiency and no paravalvular leak. Transaortic gradient are elevated peak 59 mmHg, mean 40 mmHg. There was trivial regurgitation. - Mitral valve: Structurally normal valve. There was mild regurgitation. - Left atrium: The atrium was moderately dilated. - Right ventricle: Systolic function was normal. - Right atrium: The atrium was normal in size. - Tricuspid valve: There was mild regurgitation. - Pulmonary arteries: Systolic pressure was within the normal range. - Pericardium, extracardiac: There was no pericardial effusion.  Impressions:  - When compared to the prior exam from 03/25/8100 LVEF systolic function is improved, now 50-55%, previously 35-40%. Transaortic gradients are elevated and higher than on the last exam, mean 40 mmHg, peak 40mmHg (previously mean 71mmHg, peak 40 mmHg).   Assessment / Plan: 1. S/P left transfemoral TAVR - Doing well. Getting back on his Plavix for 3 more months and then hope to stop. See Dr. Burt Knack in 3 months. I have changed the Lasix and potassium to just prn - prn swelling, weight gain, etc.   2. Post op urinary retention - resolved. Off Flomax.  3. CAD - managed medically -  No symptoms.   4. Prior stroke  5. Situational stress - remains a focal part of his care.  6. Shoulder pain - he was quite adamant at first that he wanted [facility] to evaluate and treat him. I have suggested we start with primary care first. He is agreeable.   See back in 3 months.   Patient is agreeable to this plan and will call if any problems develop in the interim.   Burtis Junes, RN, [place]  [address] [place], [place]   [address] [phone]

## 2014-06-25 ENCOUNTER — Ambulatory Visit
Admission: RE | Admit: 2014-06-25 | Discharge: 2014-06-25 | Disposition: A | Discharge status: Home / Self Care | Payer: Medicare Other | Source: Ambulatory Visit | Attending: Family Medicine | Admitting: Family Medicine

## 2014-06-25 ENCOUNTER — Other Ambulatory Visit: Payer: Self-pay | Admitting: Family Medicine

## 2014-06-25 DIAGNOSIS — M25512 Pain in left shoulder: Secondary | ICD-10-CM

## 2014-07-01 ENCOUNTER — Encounter (HOSPITAL_COMMUNITY): Payer: Self-pay | Admitting: Cardiovascular Disease

## 2014-08-30 ENCOUNTER — Other Ambulatory Visit: Payer: Self-pay | Admitting: Nurse Practitioner

## 2014-09-28 ENCOUNTER — Ambulatory Visit (INDEPENDENT_AMBULATORY_CARE_PROVIDER_SITE_OTHER): Payer: Medicare Other | Admitting: Cardiovascular Disease

## 2014-09-28 ENCOUNTER — Encounter: Payer: Self-pay | Admitting: Cardiovascular Disease

## 2014-09-28 VITALS — BP 122/86 | HR 63 | Ht 66.0 in | Wt 182.8 lb

## 2014-09-28 DIAGNOSIS — I359 Nonrheumatic aortic valve disorder, unspecified: Secondary | ICD-10-CM

## 2014-09-28 NOTE — Patient Instructions (Signed)
Your physician has recommended you make the following change in your medication:  STOP PLAVIX (Clopidogrel)  Your physician recommends that you schedule a follow-up appointment in: 3 MONTHS with Truitt Merle NP

## 2014-09-28 NOTE — Progress Notes (Signed)
Cardiology Office Note Date:  09/28/2014   ID:  [name] [name], DOB 03-07-1923, MRN [id number]  PCP:  Anthoney Harada, MD  Cardiologist:  Sherren Mocha, MD    Chief Complaint  Patient presents with  . Shortness of Breath     History of Present Illness: [name] [name] is a 81 y.o. male who presents for follow-up of coronary and valvular heart disease. In 2003 he underwent bioprosthetic aortic valve replacement. Has also undergone stenting of the left circumflex and has a known chronic occlusion of a small RCA. He developed severe bioprosthetic valve aortic insufficiency and was ultimately treated with valve and valve TAVR using a 23 mm Edwards Sapian XT valve via a transfemoral approach on 03/23/2014.  He has occasional shortness of breath at rest. He doesn't engage in any routine exercise. He remains in a difficult situation with his wife who has advanced dementia. They continue to live independently. His previous complaint of orthopnea has completely resolved. No leg swelling. He does report easy bruising.   Past Medical History  Diagnosis Date  . CVA (cerebral infarction) 12/09  . CAD (coronary artery disease)     stents in [place] and has a totally occluded RCA  . HTN (hypertension)   . Right iliac artery stenosis   . BPH (benign prostatic hyperplasia)   . Aortic stenosis     s/p AVR in 2003, bovine  . Diverticulosis   . AAA (abdominal aortic aneurysm)     s/p endovascular repair 2004  . Hyperlipemia   . Thyroid disease   . Glaucoma     recently told by Dr Janyth Contes he did not have it  . BCC (basal cell carcinoma) 09/17/2012  . Personal history of poliomyelitis 09/17/2012    In childhood  . Sleeping difficulties   . Leaky heart valve   . Chicken pox   . Polio   . Mumps   . Measles   . Aortic insufficiency     Prosthetic valve dysfunction  . Prosthetic valve dysfunction     Aortic stenosis and insufficiency   . Myocardial infarction   . Shortness of breath    exertion, orthopnea  . Stroke     no residual  . Pneumonia     hx of  . Depression   . Arthritis   . 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 Aortic valve replacement  2003    #39mm pericardial valve   . Abdominal aortic aneurysm repair  2004    s/p endovascular repiar of AAA in 2004  . Coronary stent placement    . Tonsillectomy    . Wisdom tooth extraction    . Appendectomy    . Tee without cardioversion N/A 09/02/2013    Procedure: TRANSESOPHAGEAL ECHOCARDIOGRAM (TEE);  Surgeon: Dorothy Spark, MD;  Location: [place];  Service: Cardiovascular;  Laterality: N/A;  . Cardiac catheterization    . Coronary angioplasty    . Transcatheter aortic valve replacement, transfemoral N/A 03/23/2014    Procedure: TRANSCATHETER AORTIC VALVE REPLACEMENT, TRANSFEMORAL;  Surgeon: Sherren Mocha, MD;  Location: [place];  Service: Open Heart Surgery;  Laterality: N/A;  valve in valve TAVR  . Intraoperative transesophageal echocardiogram N/A 03/23/2014    Procedure: INTRAOPERATIVE TRANSESOPHAGEAL ECHOCARDIOGRAM;  Surgeon: Sherren Mocha, MD;  Location: [facility] OR;  Service: Open Heart Surgery;  Laterality: N/A;  . Left and right heart catheterization with coronary angiogram N/A 02/08/2014    Procedure: LEFT AND RIGHT HEART CATHETERIZATION WITH CORONARY ANGIOGRAM;  Surgeon: Blane Ohara, MD;  Location: [facility] C[facility] LAB;  Service: Cardiovascular;  Laterality: N/A;    Current Outpatient Prescriptions  Medication Sig Dispense Refill  . aspirin 81 MG tablet Take 81 mg by mouth daily.    Marland Kitchen atorvastatin (LIPITOR) 80 MG tablet Take 1 tablet (80 mg total) by mouth daily. 90 tablet 3  . B Complex-C (SUPER B COMPLEX) TABS Take 1 tablet by mouth daily.    Marland Kitchen ezetimibe (ZETIA) 10 MG tablet Take 1 tablet (10 mg total) by mouth daily. 90 tablet 3  .  finasteride (PROSCAR) 5 MG tablet Take 1 tablet (5 mg total) by mouth daily. 90 tablet 3  . levothyroxine (SYNTHROID, LEVOTHROID) 50 MCG tablet Take 1 tablet (50 mcg total) by mouth daily. 90 tablet 3  . losartan (COZAAR) 50 MG tablet Take 1 tablet (50 mg total) by mouth daily. 90 tablet 3  . metoprolol tartrate (LOPRESSOR) 25 MG tablet Take 0.5 tablets (12.5 mg total) by mouth 2 (two) times daily. 30 tablet 11  . PARoxetine (PAXIL) 20 MG tablet Take 0.5 tablets (10 mg total) by mouth every morning. 45 tablet 3  . potassium chloride SA (K-DUR,KLOR-CON) 20 MEQ tablet Take 20 mEq by mouth daily as needed. When taking Lasix    . tamsulosin (FLOMAX) 0.4 MG CAPS capsule Take 0.4 mg by mouth daily.   2  . furosemide (LASIX) 40 MG tablet Take 0.5 tablets (20 mg total) by mouth daily as needed. Take only as needed - for swelling, weight gain or shortness of breath. 60 tablet 3   No current facility-administered medications for this visit.    Allergies:   Amoxicillin-pot clavulanate; Morphine and related; and Penicillins   Social History:  The patient  reports that he quit smoking about 34 years ago. His smoking use included Cigarettes. He has a 15 pack-year smoking history. He does not have any smokeless tobacco history on file. He reports that he drinks about 4.2 oz of alcohol per week. He reports that he does not use illicit drugs.   Family History:  The patient's family history includes Aneurysm in his father; Colon cancer in his mother; Heart attack in his brother; Prostate cancer in his brother.    ROS:  Please see the history of present illness.  Otherwise, review of systems is positive for hearing loss.  All other systems are reviewed and negative.    PHYSICAL EXAM: VS:  BP 122/86 mmHg  Pulse 63  Ht 5\' 6"  (1.676 m)  Wt 182 lb 12.8 oz (82.918 kg)  BMI 29.52 kg/m2  SpO2 94% , BMI Body mass index is 29.52 kg/(m^2). GEN: Well nourished, well developed, pleasant elderly male in no acute  distress HEENT: normal Neck: no JVD, no masses, no carotid bruits Cardiac: RRR grade 2/6 early peaking systolic ejection murmur at the right upper sternal border, no diastolic murmur          Respiratory:  clear to auscultation bilaterally, normal work of breathing GI: soft, nontender, nondistended, + BS MS: no deformity or atrophy Ext: no pretibial edema Skin: warm and dry, no rash Neuro:  Strength and sensation are intact Psych: euthymic mood, full affect  EKG:  EKG is not ordered today.  Recent Labs: 01/01/2014: Pro B Natriuretic peptide (BNP) 286.0* 03/19/2014: ALT 19 03/24/2014: Magnesium 1.9 04/02/2014: BUN 20;  Creatinine 1.2; Hemoglobin 12.4*; Platelets 147.0*; Potassium 4.4; Sodium 139   Lipid Panel     Component Value Date/Time   CHOL 130 05/01/2013 1459   TRIG 99.0 05/01/2013 1459   HDL 40.70 05/01/2013 1459   CHOLHDL 3 05/01/2013 1459   VLDL 19.8 05/01/2013 1459   LDLCALC 70 05/01/2013 1459      Wt Readings from Last 3 Encounters:  09/28/14 182 lb 12.8 oz (82.918 kg)  06/22/14 179 lb 1.9 oz (81.248 kg)  04/16/14 177 lb 1.9 oz (80.341 kg)     Cardiac Studies Reviewed: 2D Echo 04/16/2014: Impressions:  - When compared to the prior exam from 10/25/9199 LVEF systolic function is improved, now 50-55%, previously 35-40%. Transaortic gradients are elevated and higher than on the last exam, mean 40 mmHg, peak 40mmHg (previously mean 67mmHg, peak 40 mmHg).  ASSESSMENT AND PLAN: 1.  Aortic valve disorder, status post valve-in-valve TAVR. Clinically he seems to be doing very well. I do not appreciate a diastolic murmur on his exam. He is 6 months out from the procedure and I recommended that he stop Plavix. He can continue on aspirin 81 mg daily.  2. CAD, native vessel. No symptoms of angina. Continue current therapy. Medications were reviewed.  3. Essential hypertension: Blood pressure is under ideal control on a combination of losartan and  metoprolol.   Current medicines are reviewed with the patient today.  The patient does not have concerns regarding medicines.  The following changes have been made:  Stop Plavix  Labs/ tests ordered today include:  No orders of the defined types were placed in this encounter.   Disposition:   FU in 3 months with Truitt Merle  Signed, Sherren Mocha, MD  09/28/2014 11:56 AM    [address] [place], [place], [place]  [address] Phone: [phone]; Fax: [phone]

## 2014-10-19 ENCOUNTER — Other Ambulatory Visit: Payer: Self-pay | Admitting: Nurse Practitioner

## 2014-10-19 ENCOUNTER — Other Ambulatory Visit: Payer: Self-pay | Admitting: Cardiovascular Disease

## 2014-10-19 NOTE — Telephone Encounter (Signed)
Patient Instructions     Your physician has recommended you make the following change in your medication:  STOP PLAVIX (Clopidogrel)  Your physician recommends that you schedule a follow-up appointment in: 3 MONTHS with Truitt Merle NP   Sherren Mocha, MD at 09/28/2014 8:51 AM

## 2014-10-21 ENCOUNTER — Other Ambulatory Visit: Payer: Self-pay | Admitting: Nurse Practitioner

## 2014-11-22 ENCOUNTER — Telehealth: Payer: Self-pay | Admitting: *Deleted

## 2014-11-22 NOTE — Telephone Encounter (Signed)
Left message on machine for pt to contact the office.   

## 2014-12-01 ENCOUNTER — Encounter: Payer: Self-pay | Admitting: *Deleted

## 2014-12-29 ENCOUNTER — Ambulatory Visit: Payer: PRIVATE HEALTH INSURANCE | Admitting: Nurse Practitioner

## 2015-01-11 ENCOUNTER — Ambulatory Visit (INDEPENDENT_AMBULATORY_CARE_PROVIDER_SITE_OTHER): Payer: Medicare Other | Admitting: Nurse Practitioner

## 2015-01-11 VITALS — BP 130/90 | HR 66 | Ht 66.0 in | Wt 180.8 lb

## 2015-01-11 DIAGNOSIS — Z954 Presence of other heart-valve replacement: Secondary | ICD-10-CM | POA: Diagnosis not present

## 2015-01-11 DIAGNOSIS — I259 Chronic ischemic heart disease, unspecified: Secondary | ICD-10-CM

## 2015-01-11 DIAGNOSIS — E785 Hyperlipidemia, unspecified: Secondary | ICD-10-CM | POA: Diagnosis not present

## 2015-01-11 DIAGNOSIS — Z952 Presence of prosthetic heart valve: Secondary | ICD-10-CM

## 2015-01-11 LAB — BASIC METABOLIC PANEL
BUN: 25 mg/dL — ABNORMAL HIGH (ref 6–23)
CO2: 28 mEq/L (ref 19–32)
Calcium: 9 mg/dL (ref 8.4–10.5)
Chloride: 110 mEq/L (ref 96–112)
Creatinine, Ser: 1.21 mg/dL (ref 0.40–1.50)
GFR: 59.95 mL/min — ABNORMAL LOW (ref >60.00)
Glucose, Bld: 84 mg/dL (ref 70–99)
Potassium: 4.6 mEq/L (ref 3.5–5.1)
Sodium: 142 mEq/L (ref 135–145)

## 2015-01-11 LAB — HEPATIC FUNCTION PANEL
ALT: 51 U/L (ref 0–53)
AST: 46 U/L — ABNORMAL HIGH (ref 0–37)
Albumin: 3.4 g/dL — ABNORMAL LOW (ref 3.5–5.2)
Alkaline Phosphatase: 91 U/L (ref 39–117)
Bilirubin, Direct: 0.2 mg/dL (ref 0.0–0.3)
Total Bilirubin: 1 mg/dL (ref 0.2–1.2)
Total Protein: 6.6 g/dL (ref 6.0–8.3)

## 2015-01-11 LAB — LIPID PANEL
Cholesterol: 122 mg/dL (ref 0–200)
HDL: 28.2 mg/dL — ABNORMAL LOW (ref >39.00)
LDL Cholesterol: 70 mg/dL (ref 0–99)
NonHDL: 93.8
Total CHOL/HDL Ratio: 4
Triglycerides: 119 mg/dL (ref 0.0–149.0)
VLDL: 23.8 mg/dL (ref 0.0–40.0)

## 2015-01-11 LAB — CBC
HCT: 43.6 % (ref 39.0–52.0)
Hemoglobin: 14.4 g/dL (ref 13.0–17.0)
MCHC: 33 g/dL (ref 30.0–36.0)
MCV: 97.5 fl (ref 78.0–100.0)
Platelets: 223 10*3/uL (ref 150.0–400.0)
RBC: 4.47 Mil/uL (ref 4.22–5.81)
RDW: 13.7 % (ref 11.5–15.5)
WBC: 8.1 10*3/uL (ref 4.0–10.5)
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
## 2015-01-11 NOTE — Patient Instructions (Addendum)
We will be checking the following labs today - BMET, HPF & Lipids   Medication Instructions:    Continue with your current medicines but  I am stopping Zetia    Testing/Procedures To Be Arranged:  N/A  Follow-Up:   See Dr. Burt Knack in August as planned   Other Special Instructions:   Call Dr. Jacelyn Grip about placing [name] [name] in an Alzheimer's unit - [phone].  Call the [place] office at [phone] if you have any questions, problems or concerns.

## 2015-02-17 ENCOUNTER — Other Ambulatory Visit: Payer: Self-pay

## 2015-02-17 DIAGNOSIS — I350 Nonrheumatic aortic (valve) stenosis: Secondary | ICD-10-CM

## 2015-02-17 DIAGNOSIS — Z952 Presence of prosthetic heart valve: Secondary | ICD-10-CM

## 2015-03-21 ENCOUNTER — Other Ambulatory Visit: Payer: Self-pay | Admitting: *Deleted

## 2015-03-21 DIAGNOSIS — I714 Abdominal aortic aneurysm, without rupture, unspecified: Secondary | ICD-10-CM

## 2015-03-21 DIAGNOSIS — Z95828 Presence of other vascular implants and grafts: Secondary | ICD-10-CM

## 2015-03-25 ENCOUNTER — Ambulatory Visit (INDEPENDENT_AMBULATORY_CARE_PROVIDER_SITE_OTHER): Payer: Medicare Other | Admitting: Cardiovascular Disease

## 2015-03-25 ENCOUNTER — Encounter: Payer: Self-pay | Admitting: Cardiovascular Disease

## 2015-03-25 ENCOUNTER — Other Ambulatory Visit: Payer: Self-pay

## 2015-03-25 ENCOUNTER — Ambulatory Visit (HOSPITAL_COMMUNITY): Payer: Medicare Other | Attending: Cardiology

## 2015-03-25 VITALS — BP 130/78 | HR 56

## 2015-03-25 DIAGNOSIS — Z954 Presence of other heart-valve replacement: Secondary | ICD-10-CM | POA: Diagnosis not present

## 2015-03-25 DIAGNOSIS — I350 Nonrheumatic aortic (valve) stenosis: Secondary | ICD-10-CM

## 2015-03-25 DIAGNOSIS — Z87891 Personal history of nicotine dependence: Secondary | ICD-10-CM | POA: Insufficient documentation

## 2015-03-25 DIAGNOSIS — I10 Essential (primary) hypertension: Secondary | ICD-10-CM | POA: Insufficient documentation

## 2015-03-25 DIAGNOSIS — Z952 Presence of prosthetic heart valve: Secondary | ICD-10-CM

## 2015-03-25 DIAGNOSIS — I259 Chronic ischemic heart disease, unspecified: Secondary | ICD-10-CM | POA: Diagnosis not present

## 2015-03-25 DIAGNOSIS — E785 Hyperlipidemia, unspecified: Secondary | ICD-10-CM | POA: Diagnosis not present

## 2015-03-25 DIAGNOSIS — I517 Cardiomegaly: Secondary | ICD-10-CM | POA: Insufficient documentation

## 2015-03-25 DIAGNOSIS — I340 Nonrheumatic mitral (valve) insufficiency: Secondary | ICD-10-CM | POA: Insufficient documentation

## 2015-03-25 NOTE — Progress Notes (Signed)
Cardiology Office Note Date:  03/27/2015   ID:  DAJON LAZAR, DOB [birth year], MRN [id number]  PCP:  Anthoney Harada, MD  Cardiologist:  Sherren Mocha, MD    Chief Complaint  Patient presents with  . Shortness of Breath     History of Present Illness: [name] [name] is a 81 y.o. male who presents for follow-up evaluation. He has been followed for coronary and valvular heart disease. In 2003 he underwent bioprosthetic aortic valve replacement. Has also undergone stenting of the left circumflex and has a known chronic occlusion of a small RCA. He developed severe bioprosthetic valve aortic insufficiency and was ultimately treated with valve-in-valve TAVR using a 23 mm Edwards Sapian XT valve via a transfemoral approach on 03/23/2014.  The patient reports no recent change in symptoms. He does have dyspnea with physical exertion, but no problems with his normal activities. No chest pain, palpitations, lightheadedness, orthopnea, or PND. No leg swelling.   He's had a lot of stress over recent years with his wife's dementia. She is now in a SNF but there are still a lot of family issues around this.  Past Medical History  Diagnosis Date  . CVA (cerebral infarction) 12/09  . CAD (coronary artery disease)     stents in [place] and has a totally occluded RCA  . HTN (hypertension)   . Right iliac artery stenosis   . BPH (benign prostatic hyperplasia)   . Aortic stenosis     s/p AVR in 2003, bovine  . Diverticulosis   . AAA (abdominal aortic aneurysm)     s/p endovascular repair 2004  . Hyperlipemia   . Thyroid disease   . Glaucoma     recently told by Dr Janyth Contes he did not have it  . BCC (basal cell carcinoma) 09/17/2012  . Personal history of poliomyelitis 09/17/2012    In childhood  . Sleeping difficulties   . Leaky heart valve   . Chicken pox   . Polio   . Mumps   . Measles   . Aortic insufficiency     Prosthetic valve dysfunction  . Prosthetic valve dysfunction    Aortic stenosis and insufficiency   . Myocardial infarction   . Shortness of breath     exertion, orthopnea  . Stroke     no residual  . Pneumonia     hx of  . Depression   . Arthritis   . 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 Aortic valve replacement  2003    #37mm pericardial valve   . Abdominal aortic aneurysm repair  2004    s/p endovascular repiar of AAA in 2004  . Coronary stent placement    . Tonsillectomy    . Wisdom tooth extraction    . Appendectomy    . Tee without cardioversion N/A 09/02/2013    Procedure: TRANSESOPHAGEAL ECHOCARDIOGRAM (TEE);  Surgeon: Dorothy Spark, MD;  Location: [facility];  Service: Cardiovascular;  Laterality: N/A;  . Cardiac catheterization    . Coronary angioplasty    . Transcatheter aortic valve replacement, transfemoral N/A 03/23/2014    Procedure: TRANSCATHETER AORTIC VALVE REPLACEMENT, TRANSFEMORAL;  Surgeon: Sherren Mocha, MD;  Location: [facility];  Service: Open Heart Surgery;  Laterality: N/A;  valve in valve TAVR  . Intraoperative transesophageal echocardiogram N/A 03/23/2014    Procedure:  INTRAOPERATIVE TRANSESOPHAGEAL ECHOCARDIOGRAM;  Surgeon: Sherren Mocha, MD;  Location: [facility];  Service: Open Heart Surgery;  Laterality: N/A;  . Left and right heart catheterization with coronary angiogram N/A 02/08/2014    Procedure: LEFT AND RIGHT HEART CATHETERIZATION WITH CORONARY ANGIOGRAM;  Surgeon: Blane Ohara, MD;  Location: [facility] CATH LAB;  Service: Cardiovascular;  Laterality: N/A;    Current Outpatient Prescriptions  Medication Sig Dispense Refill  . aspirin 81 MG tablet Take 81 mg by mouth daily.    Marland Kitchen atorvastatin (LIPITOR) 80 MG tablet Take 1 tablet by mouth  daily 90 tablet 3  . B Complex-C (SUPER B COMPLEX) TABS Take 1 tablet by mouth daily.    . finasteride  (PROSCAR) 5 MG tablet Take 1 tablet by mouth  daily 90 tablet 3  . furosemide (LASIX) 40 MG tablet Take 0.5 tablets (20 mg total) by mouth daily as needed. Take only as needed - for swelling, weight gain or shortness of breath. 60 tablet 3  . levothyroxine (SYNTHROID, LEVOTHROID) 50 MCG tablet Take 1 tablet (50 mcg total) by mouth daily. 90 tablet 3  . losartan (COZAAR) 50 MG tablet Take 1 tablet by mouth  daily 90 tablet 3  . metoprolol tartrate (LOPRESSOR) 25 MG tablet Take 0.5 tablets (12.5 mg total) by mouth 2 (two) times daily. 30 tablet 11  . Multiple Vitamins-Minerals (PRESERVISION AREDS 2 PO) Take by mouth 2 (two) times daily.    Marland Kitchen PARoxetine (PAXIL) 20 MG tablet Take 0.5 tablets (10 mg total) by mouth every morning. 45 tablet 3  . potassium chloride SA (K-DUR,KLOR-CON) 20 MEQ tablet TAKE 1 TABLET BY MOUTH EVERY TIME YOU TAKE LASIX 30 tablet 3   No current facility-administered medications for this visit.    Allergies:   Amoxicillin-pot clavulanate; Morphine and related; and Penicillins   Social History:  The patient  reports that he quit smoking about 34 years ago. His smoking use included Cigarettes. He has a 15 pack-year smoking history. He does not have any smokeless tobacco history on file. He reports that he drinks about 4.2 oz of alcohol per week. He reports that he does not use illicit drugs.   Family History:  The patient's family history includes Aneurysm in his father; Colon cancer in his mother; Heart attack in his brother; Prostate cancer in his brother.    ROS:  Please see the history of present illness.   All other systems are reviewed and negative.    PHYSICAL EXAM: VS:  BP 130/78 mmHg  Pulse 56 , BMI There is no weight on file to calculate BMI. GEN: Well nourished, well developed, in no acute distress HEENT: normal Neck: no JVD, no masses. No carotid bruits Cardiac: RRR with 2/6 systolic murmur at the RUSB          Respiratory:  clear to auscultation bilaterally,  normal work of breathing GI: soft, nontender, nondistended, + BS MS: no deformity or atrophy Ext: no pretibial edema, pedal pulses 2+= bilaterally Skin: warm and dry, no rash Neuro:  Strength and sensation are intact Psych: euthymic mood, full affect  EKG:  EKG is ordered today. The ekg ordered today shows sinus brady 56 bpm, 1st degree AVB, nonspecific T wave abnormality  Recent Labs: 01/11/2015: ALT 51; BUN 25*; Creatinine, Ser 1.21; Hemoglobin 14.4; Platelets 223.0; Potassium 4.6; Sodium 142   Lipid Panel     Component Value Date/Time   CHOL 122 01/11/2015 1232   TRIG 119.0 01/11/2015 1232   HDL 28.20*  01/11/2015 1232   CHOLHDL 4 01/11/2015 1232   VLDL 23.8 01/11/2015 1232   LDLCALC 70 01/11/2015 1232      Wt Readings from Last 3 Encounters:  01/11/15 180 lb 12.8 oz (82.01 kg)  09/28/14 182 lb 12.8 oz (82.918 kg)  06/22/14 179 lb 1.9 oz (81.248 kg)     Cardiac Studies Reviewed: 03/25/2015: Study Conclusions - Left ventricle: The cavity size was normal. Wall thickness was increased in a pattern of mild LVH. The estimated ejection fraction was 55%. Basal inferior akinesis. Doppler parameters are consistent with abnormal left ventricular relaxation (grade 1 diastolic dysfunction). - Aortic valve: Status post bioprosthetic aortic valve replacement followed by valve-in-valve TAVR. There was no regurgitation. Moderately elevated gradient across the aortic valve. Mean gradient (S): 36 mm Hg. - Mitral valve: There was trivial regurgitation. - Left atrium: The atrium was mildly to moderately dilated. - Right ventricle: The cavity size was normal. Systolic function was normal. - Pulmonary arteries: No complete TR doppler jet so unable to estimate PA systolic pressure. - Inferior vena cava: The vessel was normal in size. The respirophasic diameter changes were in the normal range (= 50%), consistent with normal central venous  pressure.  Impressions:  - Normal LV size with mild LV hypertrophy. EF 55% with basal inferior akinesis. Normal RV size and systolic function. The patient is status post bioprosthetic AVR followed by valve-in-valve TAVR. Mean gradient is 36 mmHg across the aortic valve (some higher gradients obtained but appeared to be post-PVC), this is moderately elevated.  ASSESSMENT AND PLAN: 1. Aortic valve disease s/p valve-in-valve TAVR: Pt with NYHA II symptoms of exertional dyspnea. There is no residual AI. Transvalvular gradients are moderately elevated but stable from last year's study. Suspect mechanism is reduced orifice area from valve-in-valve approach rather than leaflet dysfunction. Patient is clinically stable and will plan on a repeat echo in one year. He knows to follow SBE prophylaxis.  2. CAD - stable without symptoms of angina  3. HTN - BP controlled on losartan and lopressor    Current medicines are reviewed with the patient today.  The patient does not have concerns regarding medicines.  Labs/ tests ordered today include:   Orders Placed This Encounter  Procedures  . EKG 12-Lead    Disposition:   FU 3 months with Truitt Merle, NP.   Dewaine Conger, MD  03/27/2015 10:20 AM    [facility] [place], [place], [place]  [address] Phone: [phone]; Fax: [phone]

## 2015-03-25 NOTE — Patient Instructions (Signed)
Medication Instructions:  Your physician recommends that you continue on your current medications as directed. Please refer to the Current Medication list given to you today.  Labwork: No new orders.   Testing/Procedures: No new orders.   Follow-Up: Your physician recommends that you schedule a follow-up appointment in: 3 MONTHS with Truitt Merle NP  Any Other Special Instructions Will Be Listed Below (If Applicable).  Your physician discussed the importance of taking an antibiotic prior to any dental, gastrointestinal, genitourinary procedures to prevent damage to the heart valves from infection.

## 2015-04-08 ENCOUNTER — Encounter: Payer: Self-pay | Admitting: Family

## 2015-04-11 ENCOUNTER — Encounter: Payer: Self-pay | Admitting: Family

## 2015-04-11 ENCOUNTER — Ambulatory Visit (HOSPITAL_COMMUNITY)
Admission: RE | Admit: 2015-04-11 | Discharge: 2015-04-11 | Disposition: A | Discharge status: Home / Self Care | Payer: Medicare Other | Source: Ambulatory Visit | Attending: Family | Admitting: Family

## 2015-04-11 ENCOUNTER — Ambulatory Visit (INDEPENDENT_AMBULATORY_CARE_PROVIDER_SITE_OTHER): Payer: Medicare Other | Admitting: Family

## 2015-04-11 ENCOUNTER — Other Ambulatory Visit (HOSPITAL_COMMUNITY): Payer: Medicare Other

## 2015-04-11 ENCOUNTER — Ambulatory Visit: Payer: Medicare Other | Admitting: Family

## 2015-04-11 ENCOUNTER — Other Ambulatory Visit: Payer: Self-pay | Admitting: Surgery

## 2015-04-11 VITALS — BP 114/70 | HR 64 | Temp 97.7°F | Resp 16 | Ht 66.0 in | Wt 180.0 lb

## 2015-04-11 DIAGNOSIS — Z48812 Encounter for surgical aftercare following surgery on the circulatory system: Secondary | ICD-10-CM

## 2015-04-11 DIAGNOSIS — Z95828 Presence of other vascular implants and grafts: Secondary | ICD-10-CM

## 2015-04-11 DIAGNOSIS — I714 Abdominal aortic aneurysm, without rupture, unspecified: Secondary | ICD-10-CM

## 2015-04-11 DIAGNOSIS — I259 Chronic ischemic heart disease, unspecified: Secondary | ICD-10-CM

## 2015-04-11 DIAGNOSIS — Z4889 Encounter for other specified surgical aftercare: Secondary | ICD-10-CM

## 2015-04-11 NOTE — Progress Notes (Signed)
[facility] [place]  Established EVAR  History of Present Illness  [name] [name] is a 81 y.o. (01-24-1923) male patient of Dr. Trula Slade who is back today for followup. He is status post endovascular repair of abdominal aortic aneurysm by Dr. Amedeo Plenty in 2004 using a AneuRx stent graft. He has no complaints. He denies back pain. He denies abdominal pain.  He stated that his wife has dementia and he has recently had to place her in a nursing facility after trying to care for her at home.   Dr. Trula Slade last saw pt on 04/06/13. At that time the maximum excluded sac diameter was 3.4 cm which was different from the prior study and more consistent with his previous studies The patient's aneurysm continued to decrease in size. Dr. Trula Slade suspected the study 6 months prior was inaccurate.  He had a repeat aortic valve replacement in 2015. He takes no anticoagulants. He does take a daily 81 mg ASA.  He takes Lipitor and states "Im sure that affects my muscles, I can get cramps in my muscles anywhere."  The patient denies claudication in legs with walking. The patient reports history of stroke about 2006, denies residual speech difficulties, deneis hemiparesis, denies monocular loss of vision.Marland Kitchen  Pt Diabetic: No Pt smoker: former smoker, quit about 1982  Past Medical History  Diagnosis Date  . CVA (cerebral infarction) 12/09  . CAD (coronary artery disease)     stents in [place] and has a totally occluded RCA  . HTN (hypertension)   . Right iliac artery stenosis   . BPH (benign prostatic hyperplasia)   . Aortic stenosis     s/p AVR in 2003, bovine  . Diverticulosis   . AAA (abdominal aortic aneurysm)     s/p endovascular repair 2004  . Hyperlipemia   . Thyroid disease   . Glaucoma     recently told by Dr Janyth Contes he did not have it  . BCC (basal cell carcinoma) 09/17/2012  . Personal history of poliomyelitis 09/17/2012    In childhood  . Sleeping difficulties   . Leaky  heart valve   . Chicken pox   . Polio   . Mumps   . Measles   . Aortic insufficiency     Prosthetic valve dysfunction  . Prosthetic valve dysfunction     Aortic stenosis and insufficiency   . Myocardial infarction   . Shortness of breath     exertion, orthopnea  . Stroke     no residual  . Pneumonia     hx of  . Depression   . Arthritis   . 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 Aortic valve replacement  2003    #78mm pericardial valve   . Abdominal aortic aneurysm repair  2004    s/p endovascular repiar of AAA in 2004  . Coronary stent placement    . Tonsillectomy    . Wisdom tooth extraction    . Appendectomy    . Tee without cardioversion N/A 09/02/2013    Procedure: TRANSESOPHAGEAL ECHOCARDIOGRAM (TEE);  Surgeon: Dorothy Spark, MD;  Location: [facility];  Service: Cardiovascular;  Laterality: N/A;  . Cardiac catheterization    . Coronary angioplasty    . Transcatheter aortic valve replacement, transfemoral N/A 03/23/2014    Procedure: TRANSCATHETER AORTIC VALVE REPLACEMENT, TRANSFEMORAL;  Surgeon: Sherren Mocha, MD;  Location: [facility];  Service: Open Heart Surgery;  Laterality: N/A;  valve in valve TAVR  . Intraoperative transesophageal echocardiogram N/A 03/23/2014    Procedure: INTRAOPERATIVE TRANSESOPHAGEAL ECHOCARDIOGRAM;  Surgeon: Sherren Mocha, MD;  Location: [facility] OR;  Service: Open Heart Surgery;  Laterality: N/A;  . Left and right heart catheterization with coronary angiogram N/A 02/08/2014    Procedure: LEFT AND RIGHT HEART CATHETERIZATION WITH CORONARY ANGIOGRAM;  Surgeon: Blane Ohara, MD;  Location[facility] C[facility] LAB;  Service: Cardiovascular;  Laterality: N/A;  . Coronary artery bypass graft    . Joint replacement     Social History Social History   Social History  . Marital Status: Married     Spouse Name: N/A  . Number of Children: N/A  . Years of Education: N/A   Occupational History  . retired    Social History Main Topics  . Smoking status: Former Smoker -- 0.50 packs/day for 30 years    Types: Cigarettes    Quit date: 10/04/1980  . Smokeless tobacco: Not on file  . Alcohol Use: 4.2 oz/week    7 Glasses of wine per week     Comment: cocktails/night  . Drug Use: No  . Sexual Activity: Not Currently   Other Topics Concern  . Not on file   Social History Narrative   Family History Family History  Problem Relation Age of Onset  . Colon cancer Mother   . Cancer Mother   . Aneurysm Father   . Heart attack Brother     CHF  . Prostate cancer Brother   . Cancer Brother   . Heart disease Brother   . Cancer Daughter     Current Outpatient Prescriptions on File Prior to Visit  Medication Sig Dispense Refill  . aspirin 81 MG tablet Take 81 mg by mouth daily.    Marland Kitchen atorvastatin (LIPITOR) 80 MG tablet Take 1 tablet by mouth  daily 90 tablet 3  . B Complex-C (SUPER B COMPLEX) TABS Take 1 tablet by mouth daily.    . finasteride (PROSCAR) 5 MG tablet Take 1 tablet by mouth  daily 90 tablet 3  . furosemide (LASIX) 40 MG tablet Take 0.5 tablets (20 mg total) by mouth daily as needed. Take only as needed - for swelling, weight gain or shortness of breath. 60 tablet 3  . levothyroxine (SYNTHROID, LEVOTHROID) 50 MCG tablet Take 1 tablet (50 mcg total) by mouth daily. 90 tablet 3  . losartan (COZAAR) 50 MG tablet Take 1 tablet by mouth  daily 90 tablet 3  . metoprolol tartrate (LOPRESSOR) 25 MG tablet Take 0.5 tablets (12.5 mg total) by mouth 2 (two) times daily. 30 tablet 11  . Multiple Vitamins-Minerals (PRESERVISION AREDS 2 PO) Take by mouth 2 (two) times daily.    Marland Kitchen PARoxetine (PAXIL) 20 MG tablet Take 0.5 tablets (10 mg total) by mouth every morning. 45 tablet 3  . potassium chloride SA (K-DUR,KLOR-CON) 20 MEQ tablet TAKE 1 TABLET BY MOUTH EVERY TIME YOU TAKE LASIX 30  tablet 3   No current facility-administered medications on file prior to visit.   Allergies  Allergen Reactions  . Amoxicillin-Pot Clavulanate Itching  . Morphine And Related Nausea And Vomiting  . Penicillins Hives    ROS: See HPI for pertinent positives and negatives.  Physical Examination  Filed Vitals:   04/11/15 0938  BP: 114/70  Pulse: 64  Temp: 97.7 F (36.5 C)  TempSrc: Oral  Resp: 16  Height: 5\' 6"  (1.676 m)  Weight: 180 lb (81.647 kg)  SpO2: 92%   Body mass index is 29.07 kg/(m^2).  General: A&O x 3, WD.  Pulmonary: Sym exp, good air movt, CTAB, no rales, rhonchi, or wheezing.  Cardiac: RRR, Nl S1, S2, no detected murmur.   Carotid Bruits Right Left   Positive Positive   Aorta is not palpable Radial pulses are 2+ palpable and =                          VASCULAR EXAM:                                                                                                         LE Pulses Right Left       FEMORAL  1+ palpable  1+ palpable        POPLITEAL  not palpable   not palpable       POSTERIOR TIBIAL  not palpable   not palpable        DORSALIS PEDIS      ANTERIOR TIBIAL 1+ palpable  faintly palpable      Gastrointestinal: soft, NTND, -G/R, - HSM, - masses palpated, - CVAT B.  Musculoskeletal: M/S 5/5 throughout, Extremities without ischemic changes.  Neurologic: CN 2-12 intact  except is hard of hearing, Pain and light touch intact in extremities are intact, Motor exam as listed above.   03/19/14 carotid arteries duplex at [facility]:  Bilateral - 1% to 39% ICA stenosis left greater than right. Vertebral artery fow is antegrade. - Palmar arch evaluation - Doppler waveforms remained normal bilaterally with both radial and ulnar compressions.   02/18/14 CTA abd/pelvis requested by Dr. Sherren Mocha:  Extensive atherosclerosis throughout the abdominal and pelvic vasculature, including vascular findings and measurements pertinent to  potential TAVR procedure, as detailed below. Post procedural changes of aortobi-iliac endograft are noted, originating immediately beneath the level the renal artery origins extending into the mid common iliac arteries bilaterally. The aortic and iliac portions of the graft are all widely patent, without evidence of in stent restenosis. The excluded aneurysm sac currently measures 3.7 x 4.0 cm. No high attenuation is identified within the excluded aneurysm sac to suggest presence of endoleak on today's arterial phase examination.    Non-Invasive Vascular Imaging  EVAR Duplex (04/11/2015) ABDOMINAL AORTA DUPLEX EVALUATION - POST ENDOVASCULAR REPAIR    INDICATION: Abdominal Aortic aneurysm    PREVIOUS INTERVENTION(S): Endovascular Aortic Repair performed 2004    DUPLEX EXAM:      DIAMETER AP (cm) DIAMETER TRANSVERSE (cm) VELOCITIES (cm/sec)  Aorta 3.76 4.01 31  Right Common Iliac 1.58 1.79 57  Left Common Iliac 1.62 1.94 37    Comparison Study       Date DIAMETER AP (cm) DIAMETER TRANSVERSE (cm)  04/06/2013 3.43 3.41     ADDITIONAL FINDINGS:     IMPRESSION: Abdominal aortic sac measures approximately 3.76cm AP x 4.01cm TRV, no extraluminal flow is identified.    Compared to the previous exam:  Increase  in abdominal aortic sac since pervious study on 04/06/2013.     Medical Decision Making  The patient is a 81 y.o. male who is s/p endovascular aortic repair in 2004. He has no back or abdominal pain. Today's EVAR duplex suggests an increase in abdominal aortic sac since pervious study on 04/06/2013. But no change in size of the excluded aneurysm sac compared to CTA on 02/18/14 (4.0 cm); no endo leak identified at that time.  4.1 cm today by Duplex.  Bilateral carotid arteries bruits; carotid duplex in July 2015 indicates minimal bilateral ICA stenoses.   Based on this patient's exam and diagnostic studies, and after discussing most recent CTA results and today's duplex  results with Dr. Trula Slade, the patient will follow up in 1 year  with the following studies: EVAR Duplex.  I emphasized the importance of maximal medical management including strict control of blood pressure, blood glucose, and lipid levels, antiplatelet agents, obtaining regular exercise, and continued cessation of smoking.    Thank you for allowing [place] to participate in this patient's care.  Clemon Chambers, RN, MSN, FNP-C [facility] [place] Office: [facility] Physician: Trula Slade  04/11/2015, 9:43 AM

## 2015-06-28 ENCOUNTER — Ambulatory Visit (INDEPENDENT_AMBULATORY_CARE_PROVIDER_SITE_OTHER): Payer: Medicare Other | Admitting: Nurse Practitioner

## 2015-06-28 ENCOUNTER — Encounter: Payer: Self-pay | Admitting: Nurse Practitioner

## 2015-06-28 VITALS — BP 140/100 | HR 81 | Ht 66.0 in | Wt 190.8 lb

## 2015-06-28 DIAGNOSIS — Z954 Presence of other heart-valve replacement: Secondary | ICD-10-CM

## 2015-06-28 DIAGNOSIS — I259 Chronic ischemic heart disease, unspecified: Secondary | ICD-10-CM

## 2015-06-28 DIAGNOSIS — Z952 Presence of prosthetic heart valve: Secondary | ICD-10-CM

## 2015-06-28 DIAGNOSIS — I10 Essential (primary) hypertension: Secondary | ICD-10-CM

## 2015-06-28 LAB — BASIC METABOLIC PANEL
BUN: 18 mg/dL (ref 7–25)
CO2: 28 mmol/L (ref 20–31)
Calcium: 8.8 mg/dL (ref 8.6–10.3)
Chloride: 106 mmol/L (ref 98–110)
Creat: 0.99 mg/dL (ref 0.70–1.11)
Glucose, Bld: 69 mg/dL (ref 65–99)
Potassium: 4.4 mmol/L (ref 3.5–5.3)
Sodium: 142 mmol/L (ref 135–146)

## 2015-06-28 LAB — CBC
HCT: 44.6 % (ref 39.0–52.0)
Hemoglobin: 15.2 g/dL (ref 13.0–17.0)
MCH: 32.1 pg (ref 26.0–34.0)
MCHC: 34.1 g/dL (ref 30.0–36.0)
MCV: 94.1 fL (ref 78.0–100.0)
MPV: 10.8 fL (ref 8.6–12.4)
Platelets: 152 10*3/uL (ref 150–400)
RBC: 4.74 MIL/uL (ref 4.22–5.81)
RDW: 13.4 % (ref 11.5–15.5)
WBC: 7.6 10*3/uL (ref 4.0–10.5)

## 2015-06-28 MED ORDER — METOPROLOL TARTRATE 25 MG PO TABS: 25.0000 mg | ORAL_TABLET | Freq: Two times a day (BID) | ORAL | Status: DC

## 2015-06-28 NOTE — Patient Instructions (Addendum)
We will be checking the following labs today - BMET, BNP, CBC and TSH   Medication Instructions:    Continue with your current medicines. BUT   I am increasing the Lopressor to 25 mg twice a day     Testing/Procedures To Be Arranged:  N/A  Follow-Up:   See me in one month    Other Special Instructions:   N/A    If you need a refill on your cardiac medications before your next appointment, please call your pharmacy.   Call the [place] office at [phone] if you have any questions, problems or concerns.

## 2015-06-28 NOTE — Progress Notes (Signed)
CARDIOLOGY OFFICE NOTE  Date:  06/28/2015    [name] [name] Date of Birth: 1923/01/22 Medical Record [id number]  PCP:  Anthoney Harada, MD  Cardiologist:  Burt Knack    Chief Complaint  Patient presents with  . Coronary Artery Disease    Follow up visit - seen for Dr. Burt Knack  . Hyperlipidemia  . Cardiac Valve Problem    History of Present Illness: [name] [name] is a 81 y.o. male who presents today for a 3 month check. Seen for Dr. Burt Knack. He has CAD with remote inferior MI, past stent to the LCX and has a totally occluded RCA. Had prior AVR by Dr. Cyndia Bent in 2003, past AAA repair in 2004, PVD, past CVA, HLD, BPH and hypothyroidism. He developed severe bioprosthetic valve aortic insufficiency with worsening heart failure and was ultimately treated with valve and valve TAVR using a 23 mm Edwards Sapian XT valve via a transfemoral approach on 03/23/2014.  Last seen in September by Dr. Burt Knack and felt to be doing ok.   Comes back today. Here with his son [name] [name] today. Wife got sick - was at [employer] - now at [place]. He says he is "down in the dumps". BP is up. He is not getting out. Not active at all. Not even visiting [place]. Lots of financial issues - has an Forensic psychologist and [name] [name] is working on this. Eating out. Probably getting too much salt. No chest pain. Breathing is ok. No swelling.   Past Medical History  Diagnosis Date  . CVA (cerebral infarction) 12/09  . CAD (coronary artery disease)     stents in [place] and has a totally occluded RCA  . HTN (hypertension)   . Right iliac artery stenosis   . BPH (benign prostatic hyperplasia)   . Aortic stenosis     s/p AVR in 2003, bovine  . Diverticulosis   . AAA (abdominal aortic aneurysm)     s/p endovascular repair 2004  . Hyperlipemia   . Thyroid disease   . Glaucoma     recently told by Dr Janyth Contes he did not have it  . BCC (basal cell carcinoma) 09/17/2012  . Personal history of poliomyelitis 09/17/2012    In childhood  .  Sleeping difficulties   . Leaky heart valve   . Chicken pox   . Polio   . Mumps   . Measles   . Aortic insufficiency     Prosthetic valve dysfunction  . Prosthetic valve dysfunction     Aortic stenosis and insufficiency   . Myocardial infarction ([place])   . Shortness of breath     exertion, orthopnea  . Stroke     no residual  . Pneumonia     hx of  . Depression   . Arthritis   . 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 Aortic valve replacement  2003    #67mm pericardial valve   . Abdominal aortic aneurysm repair  2004    s/p endovascular repiar of AAA in 2004  . Coronary stent placement    . Tonsillectomy    . Wisdom tooth extraction    . Appendectomy    . Tee without cardioversion N/A 09/02/2013    Procedure: TRANSESOPHAGEAL ECHOCARDIOGRAM (TEE);  Surgeon: Dorothy Spark, MD;  Location: [facility];  Service: Cardiovascular;  Laterality: N/A;  .  Cardiac catheterization    . Coronary angioplasty    . Transcatheter aortic valve replacement, transfemoral N/A 03/23/2014    Procedure: TRANSCATHETER AORTIC VALVE REPLACEMENT, TRANSFEMORAL;  Surgeon: Sherren Mocha, MD;  Location: [place];  Service: Open Heart Surgery;  Laterality: N/A;  valve in valve TAVR  . Intraoperative transesophageal echocardiogram N/A 03/23/2014    Procedure: INTRAOPERATIVE TRANSESOPHAGEAL ECHOCARDIOGRAM;  Surgeon: Sherren Mocha, MD;  Location[facility] OR;  Service: Open Heart Surgery;  Laterality: N/A;  . Left and right heart catheterization with coronary angiogram N/A 02/08/2014    Procedure: LEFT AND RIGHT HEART CATHETERIZATION WITH CORONARY ANGIOGRAM;  Surgeon: Blane Ohara, MD;  Location: [facility] CATH LAB;  Service: Cardiovascular;  Laterality: N/A;  . Coronary artery bypass graft    . Joint replacement       Medications: Current Outpatient  Prescriptions  Medication Sig Dispense Refill  . aspirin 81 MG tablet Take 81 mg by mouth daily.    Marland Kitchen atorvastatin (LIPITOR) 80 MG tablet Take 1 tablet by mouth  daily 90 tablet 3  . B Complex-C (SUPER B COMPLEX) TABS Take 1 tablet by mouth daily.    . finasteride (PROSCAR) 5 MG tablet Take 1 tablet by mouth  daily 90 tablet 3  . levothyroxine (SYNTHROID, LEVOTHROID) 50 MCG tablet Take 1 tablet (50 mcg total) by mouth daily. 90 tablet 3  . losartan (COZAAR) 50 MG tablet Take 1 tablet by mouth  daily 90 tablet 3  . metoprolol tartrate (LOPRESSOR) 25 MG tablet Take 0.5 tablets (12.5 mg total) by mouth 2 (two) times daily. 30 tablet 11  . Multiple Vitamins-Minerals (PRESERVISION AREDS 2 PO) Take by mouth 2 (two) times daily.    Marland Kitchen PARoxetine (PAXIL) 20 MG tablet Take 0.5 tablets (10 mg total) by mouth every morning. 45 tablet 3  . furosemide (LASIX) 40 MG tablet Take 0.5 tablets (20 mg total) by mouth daily as needed. Take only as needed - for swelling, weight gain or shortness of breath. (Patient not taking: Reported on 06/28/2015) 60 tablet 3  . potassium chloride SA (K-DUR,KLOR-CON) 20 MEQ tablet TAKE 1 TABLET BY MOUTH EVERY TIME YOU TAKE LASIX (Patient not taking: Reported on 06/28/2015) 30 tablet 3   No current facility-administered medications for this visit.    Allergies: Allergies  Allergen Reactions  . Amoxicillin-Pot Clavulanate Itching  . Morphine And Related Nausea And Vomiting  . Penicillins Hives    Social History: The patient  reports that he quit smoking about 34 years ago. His smoking use included Cigarettes. He has a 15 pack-year smoking history. He does not have any smokeless tobacco history on file. He reports that he drinks about 4.2 oz of alcohol per week. He reports that he does not use illicit drugs.   Family History: The patient's family history includes Aneurysm in his father; Cancer in his brother, daughter, and mother; Colon cancer in his mother; Heart attack in his  brother; Heart disease in his brother; Prostate cancer in his brother.   Review of Systems: Please see the history of present illness.   Otherwise, the review of systems is positive for none.   All other systems are reviewed and negative.   Physical Exam: VS:  BP 140/100 mmHg  Pulse 81  Ht 5\' 6"  (1.676 m)  Wt 190 lb 12.8 oz (86.546 kg)  BMI 30.81 kg/m2  SpO2 93% .  BMI Body mass index is 30.81 kg/(m^2).  Wt Readings from Last 3 Encounters:  06/28/15 190 lb 12.8  oz (86.546 kg)  04/11/15 180 lb (81.647 kg)  01/11/15 180 lb 12.8 oz (82.01 kg)   BP is 160/100 by me.   General: Pleasant. Elderly male who is alert and in no acute distress. He is clearly upset.   HEENT: Normal. Neck: Supple, no JVD, carotid bruits, or masses noted.  Cardiac: Regular rate and rhythm. Soft outflow murmur. No edema.  Respiratory:  Lungs are clear to auscultation bilaterally with normal work of breathing.  GI: Soft and nontender.  MS: No deformity or atrophy. Gait and ROM intact. Skin: Warm and dry. Color is normal.  Neuro:  Strength and sensation are intact and no gross focal deficits noted.  Psych: Alert, appropriate and with normal affect.   LABORATORY DATA:  EKG:  EKG is not ordered today.  Lab Results  Component Value Date   WBC 8.1 01/11/2015   HGB 14.4 01/11/2015   HCT 43.6 01/11/2015   PLT 223.0 01/11/2015   GLUCOSE 84 01/11/2015   CHOL 122 01/11/2015   TRIG 119.0 01/11/2015   HDL 28.20* 01/11/2015   LDLCALC 70 01/11/2015   ALT 51 01/11/2015   AST 46* 01/11/2015   NA 142 01/11/2015   K 4.6 01/11/2015   CL 110 01/11/2015   CREATININE 1.21 01/11/2015   BUN 25* 01/11/2015   CO2 28 01/11/2015   TSH 5.00 08/21/2013   INR 1.25 03/23/2014   HGBA1C 6.3* 03/19/2014    BNP (last 3 results) No results for input(s): BNP in the last 8760 hours.  ProBNP (last 3 results) No results for input(s): PROBNP in the last 8760 hours.   Other Studies Reviewed Today:  03/25/2015: Study  Conclusions - Left ventricle: The cavity size was normal. Wall thickness was increased in a pattern of mild LVH. The estimated ejection fraction was 55%. Basal inferior akinesis. Doppler parameters are consistent with abnormal left ventricular relaxation (grade 1 diastolic dysfunction). - Aortic valve: Status post bioprosthetic aortic valve replacement followed by valve-in-valve TAVR. There was no regurgitation. Moderately elevated gradient across the aortic valve. Mean gradient (S): 36 mm Hg. - Mitral valve: There was trivial regurgitation. - Left atrium: The atrium was mildly to moderately dilated. - Right ventricle: The cavity size was normal. Systolic function was normal. - Pulmonary arteries: No complete TR doppler jet so unable to estimate PA systolic pressure. - Inferior vena cava: The vessel was normal in size. The respirophasic diameter changes were in the normal range (= 50%), consistent with normal central venous pressure.  Impressions:  - Normal LV size with mild LV hypertrophy. EF 55% with basal inferior akinesis. Normal RV size and systolic function. The patient is status post bioprosthetic AVR followed by valve-in-valve TAVR. Mean gradient is 36 mmHg across the aortic valve (some higher gradients obtained but appeared to be post-PVC), this is moderately elevated.  ASSESSMENT AND PLAN: 1. Aortic valve disease s/p valve-in-valve TAVR:  Pt with NYHA II symptoms of exertional dyspnea. There is no residual AI. Transvalvular gradients are moderately elevated but stable from last year's study. Suspect mechanism is reduced orifice area from valve-in-valve approach rather than leaflet dysfunction. Patient is clinically stable and will plan on a repeat echo in one year - September 2017. Checking labs today. I suspect that most of his issue is from grief/loss with Connie's placement in skilled care. Needs to get out of the house - talked about signing  up for [place], etc. Increasing his metoprolol today.  He knows to follow SBE prophylaxis.  2. CAD -  stable without symptoms of angina  3. HTN - BP not controlled today. I suspect most of this is from how emotional he is now. Will increase his Lopressor. See back in one month. Checking BNP with his labs today.   Current medicines are reviewed with the patient today.  The patient does not have concerns regarding medicines other than what has been noted above.  The following changes have been made:  See above.  Labs/ tests ordered today include:   No orders of the defined types were placed in this encounter.     Disposition:   FU with me in 4 months.   Patient is agreeable to this plan and will call if any problems develop in the interim.   Signed: Burtis Junes, RN, ANP-C 06/28/2015 2:01 PM  [facility] [address] [place], [place]  [address] Phone: [phone] Fax: [phone]

## 2015-06-29 LAB — BRAIN NATRIURETIC PEPTIDE: Brain Natriuretic Peptide: 126.5 pg/mL — ABNORMAL HIGH (ref 0.0–100.0)

## 2015-06-29 LAB — TSH: TSH: 3.199 u[IU]/mL (ref 0.350–4.500)

## 2015-08-09 ENCOUNTER — Ambulatory Visit (INDEPENDENT_AMBULATORY_CARE_PROVIDER_SITE_OTHER): Payer: Medicare Other | Admitting: Nurse Practitioner

## 2015-08-09 ENCOUNTER — Encounter: Payer: Self-pay | Admitting: Nurse Practitioner

## 2015-08-09 VITALS — BP 150/100 | HR 56 | Ht 66.0 in | Wt 189.4 lb

## 2015-08-09 DIAGNOSIS — I10 Essential (primary) hypertension: Secondary | ICD-10-CM | POA: Diagnosis not present

## 2015-08-09 DIAGNOSIS — Z954 Presence of other heart-valve replacement: Secondary | ICD-10-CM | POA: Diagnosis not present

## 2015-08-09 DIAGNOSIS — Z952 Presence of prosthetic heart valve: Secondary | ICD-10-CM

## 2015-08-09 DIAGNOSIS — I259 Chronic ischemic heart disease, unspecified: Secondary | ICD-10-CM | POA: Diagnosis not present

## 2015-08-09 MED ORDER — LOSARTAN POTASSIUM 100 MG PO TABS: 100.0000 mg | ORAL_TABLET | Freq: Every day | ORAL | Status: DC
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
## 2015-08-09 NOTE — Patient Instructions (Addendum)
We will be checking the following labs today - NONE   Medication Instructions:    Continue with your current medicines. BUT  I am increasing the Losartan to 100 mg a day - you can take 2 of your 50 mg tablets and use those up - the RX for the 100 mg was sent to [facility]    Testing/Procedures To Be Arranged:  N/A  Follow-Up:   See me in 3 months    Other Special Instructions:   Remember what you promised me!!    If you need a refill on your cardiac medications before your next appointment, please call your pharmacy.   Call the [place] office at [phone] if you have any questions, problems or concerns.

## 2015-09-26 ENCOUNTER — Other Ambulatory Visit: Payer: Self-pay | Admitting: Nurse Practitioner

## 2015-10-10 ENCOUNTER — Telehealth: Payer: Self-pay

## 2015-10-10 NOTE — Telephone Encounter (Signed)
I spoke with the pt and he complained of not being able to sleep at night and SOB when lying down.  The pt feels like his symptoms are worsening and they have been an issue since his appointment in January. The pt's weight today 190 lbs and BP 168/76, pulse 60.  The pt denies any swelling. The pt took metoprolol this morning and he takes his losartan at night.  I spoke with Truitt Merle NP and she would like to see the pt tomorrow.  She also advised that the pt take a dosage of Furosemide today if he has any available.  I spoke with the pt and he cannot do an appointment tomorrow, I have scheduled him to be seen 10/12/15. The pt did locate a bottle of Furosemide but he could not locate the mg. I instructed him to take one tablet at this time and then bring all medication bottles into his appointment.  The pt verbalized understanding of instructions.

## 2015-10-11 ENCOUNTER — Ambulatory Visit: Payer: Medicare Other | Admitting: Nurse Practitioner

## 2015-10-12 ENCOUNTER — Encounter: Payer: Self-pay | Admitting: Nurse Practitioner

## 2015-10-12 ENCOUNTER — Ambulatory Visit (INDEPENDENT_AMBULATORY_CARE_PROVIDER_SITE_OTHER): Payer: Medicare Other | Admitting: Nurse Practitioner

## 2015-10-12 VITALS — BP 128/88 | HR 70 | Ht 66.0 in | Wt 186.8 lb

## 2015-10-12 DIAGNOSIS — I38 Endocarditis, valve unspecified: Secondary | ICD-10-CM | POA: Diagnosis not present

## 2015-10-12 DIAGNOSIS — I259 Chronic ischemic heart disease, unspecified: Secondary | ICD-10-CM

## 2015-10-12 DIAGNOSIS — R0600 Dyspnea, unspecified: Secondary | ICD-10-CM | POA: Diagnosis not present

## 2015-10-12 LAB — BASIC METABOLIC PANEL
BUN: 31 mg/dL — ABNORMAL HIGH (ref 7–25)
CO2: 28 mmol/L (ref 20–31)
Calcium: 9.1 mg/dL (ref 8.6–10.3)
Chloride: 104 mmol/L (ref 98–110)
Creat: 1.69 mg/dL — ABNORMAL HIGH (ref 0.70–1.11)
Glucose, Bld: 95 mg/dL (ref 65–99)
Potassium: 3.9 mmol/L (ref 3.5–5.3)
Sodium: 144 mmol/L (ref 135–146)

## 2015-10-12 LAB — CBC
HCT: 43.5 % (ref 39.0–52.0)
Hemoglobin: 14.9 g/dL (ref 13.0–17.0)
MCH: 32.5 pg (ref 26.0–34.0)
MCHC: 34.3 g/dL (ref 30.0–36.0)
MCV: 94.8 fL (ref 78.0–100.0)
MPV: 11.3 fL (ref 8.6–12.4)
Platelets: 156 10*3/uL (ref 150–400)
RBC: 4.59 MIL/uL (ref 4.22–5.81)
RDW: 13.3 % (ref 11.5–15.5)
WBC: 7.9 10*3/uL (ref 4.0–10.5)

## 2015-10-12 LAB — MAGNESIUM: Magnesium: 2 mg/dL (ref 1.5–2.5)

## 2015-10-12 NOTE — Progress Notes (Signed)
CARDIOLOGY OFFICE NOTE  Date:  10/12/2015    [name] [name] Date of Birth: October 31, 1922 Medical Record [id number]  PCP:  Anthoney Harada, MD  Cardiologist:  Burt Knack    Chief Complaint  Patient presents with  . Congestive Heart Failure  . Hypertension  . Cardiac Valve Problem    Work in visit - seen for Dr. Burt Knack    History of Present Illness: [name] [name] is a 82 y.o. male who presents today for a work in visit. Seen for Dr. Burt Knack. He has CAD with remote inferior MI, past stent to the LCX and has a totally occluded RCA. Had prior AVR by Dr. Cyndia Bent in 2003, past AAA repair in 2004, PVD, past CVA, HLD, BPH and hypothyroidism. He developed severe bioprosthetic valve aortic insufficiency with worsening heart failure and was ultimately treated with valve and valve TAVR using a 23 mm Edwards Sapian XT valve via a transfemoral approach on 03/23/2014.  Last seen in September by Dr. Burt Knack and felt to be doing ok.   I saw him back in December & January - wife had been placed in the nursing home. He was not adjusting at all - not active, angry, etc. Getting too much salt. Eating out. Weight up. BP was up and I adjusted his medicines.   Phone call earlier this week- weight was up - he was short of breath. Restarted his Lasix - he did not know what the dose was.   Comes back today. Here with daughter today. Feels better. Weight is back down. He was able to lie down last night and sleep better. He is not visiting [name] very much - he seems to be adjusting to her placement. No chest pain. Not clear how much salt he is getting.   Past Medical History  Diagnosis Date  . CVA (cerebral infarction) 12/09  . CAD (coronary artery disease)     stents in [place] and has a totally occluded RCA  . HTN (hypertension)   . Right iliac artery stenosis (HCC)   . BPH (benign prostatic hyperplasia)   . Aortic stenosis     s/p AVR in 2003, bovine  . Diverticulosis   . AAA (abdominal aortic  aneurysm) ([place])     s/p endovascular repair 2004  . Hyperlipemia   . Thyroid disease   . Glaucoma     recently told by Dr Janyth Contes he did not have it  . BCC (basal cell carcinoma) 09/17/2012  . Personal history of poliomyelitis 09/17/2012    In childhood  . Sleeping difficulties   . Leaky heart valve   . Chicken pox   . Polio   . Mumps   . Measles   . Aortic insufficiency     Prosthetic valve dysfunction  . Prosthetic valve dysfunction     Aortic stenosis and insufficiency   . Myocardial infarction ([place])   . Shortness of breath     exertion, orthopnea  . Stroke [facility])     no residual  . Pneumonia     hx of  . Depression   . Arthritis   . 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 Aortic valve replacement  2003    #69mm pericardial valve   . Abdominal aortic aneurysm repair  2004    s/p endovascular repiar of AAA in  2004  . Coronary stent placement    . Tonsillectomy    . Wisdom tooth extraction    . Appendectomy    . Tee without cardioversion N/A 09/02/2013    Procedure: TRANSESOPHAGEAL ECHOCARDIOGRAM (TEE);  Surgeon: Dorothy Spark, MD;  Location: [place];  Service: Cardiovascular;  Laterality: N/A;  . Cardiac catheterization    . Coronary angioplasty    . Transcatheter aortic valve replacement, transfemoral N/A 03/23/2014    Procedure: TRANSCATHETER AORTIC VALVE REPLACEMENT, TRANSFEMORAL;  Surgeon: Sherren Mocha, MD;  Location: [place];  Service: Open Heart Surgery;  Laterality: N/A;  valve in valve TAVR  . Intraoperative transesophageal echocardiogram N/A 03/23/2014    Procedure: INTRAOPERATIVE TRANSESOPHAGEAL ECHOCARDIOGRAM;  Surgeon: Sherren Mocha, MD;  Location: [facility] OR;  Service: Open Heart Surgery;  Laterality: N/A;  . Left and right heart catheterization with coronary angiogram N/A 02/08/2014     Procedure: LEFT AND RIGHT HEART CATHETERIZATION WITH CORONARY ANGIOGRAM;  Surgeon: Blane Ohara, MD;  Location: [facility] CATH LAB;  Service: Cardiovascular;  Laterality: N/A;  . Coronary artery bypass graft    . Joint replacement       Medications: Current Outpatient Prescriptions  Medication Sig Dispense Refill  . aspirin 81 MG tablet Take 81 mg by mouth daily.    Marland Kitchen atorvastatin (LIPITOR) 80 MG tablet Take 1 tablet by mouth  daily 90 tablet 3  . B Complex-C (SUPER B COMPLEX) TABS Take 1 tablet by mouth daily.    . finasteride (PROSCAR) 5 MG tablet Take 1 tablet by mouth  daily 90 tablet 3  . furosemide (LASIX) 40 MG tablet Take 40 mg by mouth daily.    Marland Kitchen levothyroxine (SYNTHROID, LEVOTHROID) 50 MCG tablet Take 1 tablet (50 mcg total) by mouth daily. 90 tablet 3  . losartan (COZAAR) 100 MG tablet Take 1 tablet (100 mg total) by mouth daily. 90 tablet 3  . metoprolol tartrate (LOPRESSOR) 25 MG tablet Take 1 tablet (25 mg total) by mouth 2 (two) times daily. 180 tablet 3  . Multiple Vitamins-Minerals (PRESERVISION AREDS 2 PO) Take 1 capsule by mouth 2 (two) times daily.     Marland Kitchen PARoxetine (PAXIL) 20 MG tablet Take 0.5 tablets (10 mg total) by mouth every morning. 45 tablet 3   No current facility-administered medications for this visit.    Allergies: Allergies  Allergen Reactions  . Amoxicillin-Pot Clavulanate Itching  . Morphine And Related Nausea And Vomiting  . Penicillins Hives    Social History: The patient  reports that he quit smoking about 35 years ago. His smoking use included Cigarettes. He has a 15 pack-year smoking history. He does not have any smokeless tobacco history on file. He reports that he drinks about 4.2 oz of alcohol per week. He reports that he does not use illicit drugs.   Family History: The patient's family history includes Aneurysm in his father; Cancer in his brother, daughter, and mother; Colon cancer in his mother; Heart attack in his brother; Heart disease  in his brother; Prostate cancer in his brother.   Review of Systems: Please see the history of present illness.   Otherwise, the review of systems is positive for none.   All other systems are reviewed and negative.   Physical Exam: VS:  BP 128/88 mmHg  Pulse 70  Ht 5\' 6"  (1.676 m)  Wt 186 lb 12.8 oz (84.732 kg)  BMI 30.16 kg/m2  SpO2 94% .  BMI Body mass index is 30.16 kg/(m^2).  Wt Readings  from Last 3 Encounters:  10/12/15 186 lb 12.8 oz (84.732 kg)  08/09/15 189 lb 6.4 oz (85.911 kg)  06/28/15 190 lb 12.8 oz (86.546 kg)    General: Elderly male. He is alert. Not as angry today as he has been and he is alert and in no acute distress. His weight is down 3 pounds since I last saw him.  HEENT: Normal. Neck: Supple, no JVD, carotid bruits, or masses noted.  Cardiac: Regular rate and rhythm. Harsh outflow murmur.  No edema.  Respiratory:  Lungs are clear to auscultation bilaterally with normal work of breathing.  GI: Soft and nontender.  MS: No deformity or atrophy. Gait and ROM intact. Skin: Warm and dry. Color is normal.  Neuro:  Strength and sensation are intact and no gross focal deficits noted.  Psych: Alert, appropriate and with normal affect.   LABORATORY DATA:  EKG:  EKG is ordered today. This demonstrates NSR with inferolateral ST and T wave changes.  Lab Results  Component Value Date   WBC 7.6 06/28/2015   HGB 15.2 06/28/2015   HCT 44.6 06/28/2015   PLT 152 06/28/2015   GLUCOSE 69 06/28/2015   CHOL 122 01/11/2015   TRIG 119.0 01/11/2015   HDL 28.20* 01/11/2015   LDLCALC 70 01/11/2015   ALT 51 01/11/2015   AST 46* 01/11/2015   NA 142 06/28/2015   K 4.4 06/28/2015   CL 106 06/28/2015   CREATININE 0.99 06/28/2015   BUN 18 06/28/2015   CO2 28 06/28/2015   TSH 3.199 06/28/2015   INR 1.25 03/23/2014   HGBA1C 6.3* 03/19/2014    BNP (last 3 results) No results for input(s): BNP in the last 8760 hours.  ProBNP (last 3 results) No results for input(s):  PROBNP in the last 8760 hours.   Other Studies Reviewed Today:  03/25/2015:  Echo Study Conclusions - Left ventricle: The cavity size was normal. Wall thickness was increased in a pattern of mild LVH. The estimated ejection fraction was 55%. Basal inferior akinesis. Doppler parameters are consistent with abnormal left ventricular relaxation (grade 1 diastolic dysfunction). - Aortic valve: Status post bioprosthetic aortic valve replacement followed by valve-in-valve TAVR. There was no regurgitation. Moderately elevated gradient across the aortic valve. Mean gradient (S): 36 mm Hg. - Mitral valve: There was trivial regurgitation. - Left atrium: The atrium was mildly to moderately dilated. - Right ventricle: The cavity size was normal. Systolic function was normal. - Pulmonary arteries: No complete TR doppler jet so unable to estimate PA systolic pressure. - Inferior vena cava: The vessel was normal in size. The respirophasic diameter changes were in the normal range (= 50%), consistent with normal central venous pressure.  Impressions:  - Normal LV size with mild LV hypertrophy. EF 55% with basal inferior akinesis. Normal RV size and systolic function. The patient is status post bioprosthetic AVR followed by valve-in-valve TAVR. Mean gradient is 36 mmHg across the aortic valve (some higher gradients obtained but appeared to be post-PVC), this is moderately elevated.  ASSESSMENT AND PLAN: 1. Aortic valve disease s/p valve-in-valve TAVR: Pt with NYHA II/II symptoms of exertional dyspnea. Now with more symptoms. Will get echo updated. Keep on low dose lasix at 20 mg a day. May need to add potassium - will see what the labs show. Lab today. See back in 2 weeks.   2. CAD - stable without symptoms of angina  3. HTN - BP improved with his current regimen.   4. Situational stress - he  seems to be adjusting better.   Current medicines are reviewed with  the patient today.  The patient does not have concerns regarding medicines other than what has been noted above.  The following changes have been made:  See above.  Labs/ tests ordered today include:    Orders Placed This Encounter  Procedures  . Brain natriuretic peptide  . Basic metabolic panel  . CBC  . EKG 12-Lead  . ECHOCARDIOGRAM COMPLETE     Disposition:   FU with 2 weeks with me.    Patient is agreeable to this plan and will call if any problems develop in the interim.   Signed: Burtis Junes, RN, ANP-C 10/12/2015 3:13 PM  [facility] [address] [place], [place]  [address] Phone: [phone] Fax: [phone]

## 2015-10-12 NOTE — Patient Instructions (Addendum)
We will be checking the following labs today - BMET, BNP, Mg and CBC   Medication Instructions:    Continue with your current medicines. BUT  I am putting you on just half dose of Lasix - this will be 20 mg to take just once a day - in the mornings    Testing/Procedures To Be Arranged:  Echocardiogram  Follow-Up:   See me in 2 weeks.     Other Special Instructions:   N/A    If you need a refill on your cardiac medications before your next appointment, please call your pharmacy.   Call the [place] office at [phone] if you have any questions, problems or concerns.

## 2015-10-13 LAB — BRAIN NATRIURETIC PEPTIDE: Brain Natriuretic Peptide: 53.3 pg/mL (ref <100)

## 2015-10-14 ENCOUNTER — Other Ambulatory Visit: Payer: Self-pay

## 2015-10-14 MED ORDER — POTASSIUM CHLORIDE ER 10 MEQ PO TBCR: 10.0000 meq | EXTENDED_RELEASE_TABLET | Freq: Every day | ORAL | Status: DC

## 2015-10-27 ENCOUNTER — Ambulatory Visit (HOSPITAL_COMMUNITY): Payer: Medicare Other | Attending: Nurse Practitioner

## 2015-10-27 ENCOUNTER — Other Ambulatory Visit: Payer: Self-pay

## 2015-10-27 DIAGNOSIS — Z87891 Personal history of nicotine dependence: Secondary | ICD-10-CM | POA: Diagnosis not present

## 2015-10-27 DIAGNOSIS — I252 Old myocardial infarction: Secondary | ICD-10-CM | POA: Insufficient documentation

## 2015-10-27 DIAGNOSIS — I10 Essential (primary) hypertension: Secondary | ICD-10-CM | POA: Diagnosis not present

## 2015-10-27 DIAGNOSIS — R0600 Dyspnea, unspecified: Secondary | ICD-10-CM

## 2015-10-27 DIAGNOSIS — Z951 Presence of aortocoronary bypass graft: Secondary | ICD-10-CM | POA: Diagnosis not present

## 2015-10-27 DIAGNOSIS — I059 Rheumatic mitral valve disease, unspecified: Secondary | ICD-10-CM | POA: Diagnosis not present

## 2015-10-27 DIAGNOSIS — I38 Endocarditis, valve unspecified: Secondary | ICD-10-CM | POA: Diagnosis not present

## 2015-10-27 DIAGNOSIS — E785 Hyperlipidemia, unspecified: Secondary | ICD-10-CM | POA: Diagnosis not present

## 2015-10-27 DIAGNOSIS — I2510 Atherosclerotic heart disease of native coronary artery without angina pectoris: Secondary | ICD-10-CM | POA: Insufficient documentation

## 2015-10-27 DIAGNOSIS — Z953 Presence of xenogenic heart valve: Secondary | ICD-10-CM | POA: Insufficient documentation

## 2015-10-31 ENCOUNTER — Telehealth: Payer: Self-pay | Admitting: *Deleted

## 2015-10-31 NOTE — Telephone Encounter (Signed)
Pt has been notified of his echo results by phone with verbal understanding. I confirmed appt 4/14 w/Lori Servando Snare, NP. Pt wanted to let Cecille Rubin know that they have Hospice now for his wife so there is a possibility he may need to cancel 4/14 appt .

## 2015-10-31 NOTE — Telephone Encounter (Signed)
I called pt to let him know that Tera Helper. NP said thank you for the update and to keep her posted. Pt said thank you.

## 2015-11-04 ENCOUNTER — Ambulatory Visit: Payer: Medicare Other | Admitting: Nurse Practitioner

## 2015-11-28 ENCOUNTER — Other Ambulatory Visit: Payer: Self-pay | Admitting: Nurse Practitioner

## 2015-12-16 ENCOUNTER — Ambulatory Visit (INDEPENDENT_AMBULATORY_CARE_PROVIDER_SITE_OTHER): Payer: Medicare Other | Admitting: Nurse Practitioner

## 2015-12-16 ENCOUNTER — Encounter: Payer: Self-pay | Admitting: Nurse Practitioner

## 2015-12-16 VITALS — BP 168/84 | Ht 66.0 in | Wt 190.4 lb

## 2015-12-16 DIAGNOSIS — I10 Essential (primary) hypertension: Secondary | ICD-10-CM

## 2015-12-16 DIAGNOSIS — I259 Chronic ischemic heart disease, unspecified: Secondary | ICD-10-CM

## 2015-12-16 DIAGNOSIS — R0600 Dyspnea, unspecified: Secondary | ICD-10-CM | POA: Diagnosis not present

## 2015-12-16 DIAGNOSIS — Z954 Presence of other heart-valve replacement: Secondary | ICD-10-CM

## 2015-12-16 DIAGNOSIS — Z952 Presence of prosthetic heart valve: Secondary | ICD-10-CM

## 2015-12-16 LAB — CBC
HCT: 41.8 % (ref 38.5–50.0)
Hemoglobin: 14.5 g/dL (ref 13.2–17.1)
MCH: 33.1 pg — ABNORMAL HIGH (ref 27.0–33.0)
MCHC: 34.7 g/dL (ref 32.0–36.0)
MCV: 95.4 fL (ref 80.0–100.0)
MPV: 10.2 fL (ref 7.5–12.5)
Platelets: 130 10*3/uL — ABNORMAL LOW (ref 140–400)
RBC: 4.38 MIL/uL (ref 4.20–5.80)
RDW: 13.9 % (ref 11.0–15.0)
WBC: 7.6 10*3/uL (ref 3.8–10.8)

## 2015-12-16 LAB — BASIC METABOLIC PANEL
BUN: 21 mg/dL (ref 7–25)
CO2: 27 mmol/L (ref 20–31)
Calcium: 8.7 mg/dL (ref 8.6–10.3)
Chloride: 105 mmol/L (ref 98–110)
Creat: 1.24 mg/dL — ABNORMAL HIGH (ref 0.70–1.11)
Glucose, Bld: 78 mg/dL (ref 65–99)
Potassium: 4.4 mmol/L (ref 3.5–5.3)
Sodium: 141 mmol/L (ref 135–146)

## 2015-12-16 LAB — BRAIN NATRIURETIC PEPTIDE: Brain Natriuretic Peptide: 175.1 pg/mL — ABNORMAL HIGH (ref <100)

## 2015-12-16 NOTE — Patient Instructions (Addendum)
We will be checking the following labs today - BMET and BNP   Medication Instructions:    Continue with your current medicines.     Testing/Procedures To Be Arranged:  N/A  Follow-Up:   See Dr. Jacelyn Grip to get your medicines refilled.  See Dr. Burt Knack in 6 months.     Other Special Instructions:   N/A    If you need a refill on your cardiac medications before your next appointment, please call your pharmacy.   Call the [place] office at [phone] if you have any questions, problems or concerns.

## 2015-12-16 NOTE — Progress Notes (Signed)
CARDIOLOGY OFFICE NOTE  Date:  12/16/2015    [name] Date of Birth: 03/09/1923 Medical Record [id number]  PCP:  Anthoney Harada, MD  Cardiologist:  Burt Knack    Chief Complaint  Patient presents with  . Congestive Heart Failure  . Cardiomyopathy  . Aortic Stenosis  . Coronary Artery Disease    Follow up visit - seen for Dr. Burt Knack    History of Present Illness: [name] [name] is a 82 y.o. male who presents today for a follow up visit.  Seen for Dr. Burt Knack.   He has CAD with remote inferior MI, past stent to the LCX and has a totally occluded RCA. Had prior AVR by Dr. Cyndia Bent in 2003, past AAA repair in 2004, PVD, past CVA, HLD, BPH and hypothyroidism. He developed severe bioprosthetic valve aortic insufficiency with worsening heart failure and was ultimately treated with valve and valve TAVR using a 23 mm Edwards Sapian XT valve via a transfemoral approach on 03/23/2014.  Last seen in September by Dr. Burt Knack and felt to be doing ok.   I saw him back in December & January - wife had been placed in the nursing home. He was not adjusting at all - not active, angry, etc. Getting too much salt. Eating out. Weight up. BP was up and I adjusted his medicines. Seen back in January- more short of breath - got him back on Lasix and at last visit with me, he was doing ok. Did get his echo updated in early April.   His wife has since passed away.   Comes back today. Here alone today - which is a surprise. He is yelling. He is cursing. Says he just wants to die. Refuses to see Dr. Jacelyn Grip to get his medicines refilled due to "it will cost 200 to 300 dollars" and he refuses to do this. Currently has no refills of his thyroid medicine or Paxil. Stopped his potassium - not clear as to why. He takes his Lasix most days but sounds like he has had some incontinence so he has cut back. Not really short of breath and no PND/orthopnea. His swelling has improved. He admits he is not doing much.  Sleeps a lot.   Past Medical History  Diagnosis Date  . CVA (cerebral infarction) 12/09  . CAD (coronary artery disease)     stents in [place] and has a totally occluded RCA  . HTN (hypertension)   . Right iliac artery stenosis (HCC)   . BPH (benign prostatic hyperplasia)   . Aortic stenosis     s/p AVR in 2003, bovine  . Diverticulosis   . AAA (abdominal aortic aneurysm) ([place])     s/p endovascular repair 2004  . Hyperlipemia   . Thyroid disease   . Glaucoma     recently told by Dr Janyth Contes he did not have it  . BCC (basal cell carcinoma) 09/17/2012  . Personal history of poliomyelitis 09/17/2012    In childhood  . Sleeping difficulties   . Leaky heart valve   . Chicken pox   . Polio   . Mumps   . Measles   . Aortic insufficiency     Prosthetic valve dysfunction  . Prosthetic valve dysfunction     Aortic stenosis and insufficiency   . Myocardial infarction ([place])   . Shortness of breath     exertion, orthopnea  . Stroke [facility])     no residual  . Pneumonia  hx of  . Depression   . Arthritis   . 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 Aortic valve replacement  2003    #77mm pericardial valve   . Abdominal aortic aneurysm repair  2004    s/p endovascular repiar of AAA in 2004  . Coronary stent placement    . Tonsillectomy    . Wisdom tooth extraction    . Appendectomy    . Tee without cardioversion N/A 09/02/2013    Procedure: TRANSESOPHAGEAL ECHOCARDIOGRAM (TEE);  Surgeon: Dorothy Spark, MD;  Location: [place];  Service: Cardiovascular;  Laterality: N/A;  . Cardiac catheterization    . Coronary angioplasty    . Transcatheter aortic valve replacement, transfemoral N/A 03/23/2014    Procedure: TRANSCATHETER AORTIC VALVE REPLACEMENT, TRANSFEMORAL;  Surgeon: Sherren Mocha, MD;  Location: [place];   Service: Open Heart Surgery;  Laterality: N/A;  valve in valve TAVR  . Intraoperative transesophageal echocardiogram N/A 03/23/2014    Procedure: INTRAOPERATIVE TRANSESOPHAGEAL ECHOCARDIOGRAM;  Surgeon: Sherren Mocha, MD;  Location: [facility] OR;  Service: Open Heart Surgery;  Laterality: N/A;  . Left and right heart catheterization with coronary angiogram N/A 02/08/2014    Procedure: LEFT AND RIGHT HEART CATHETERIZATION WITH CORONARY ANGIOGRAM;  Surgeon: Blane Ohara, MD;  Location: [facility] CATH LAB;  Service: Cardiovascular;  Laterality: N/A;  . Coronary artery bypass graft    . Joint replacement       Medications: Current Outpatient Prescriptions  Medication Sig Dispense Refill  . aspirin 81 MG tablet Take 81 mg by mouth daily.    Marland Kitchen atorvastatin (LIPITOR) 80 MG tablet Take 1 tablet by mouth  daily 90 tablet 3  . B Complex-C (SUPER B COMPLEX) TABS Take 1 tablet by mouth daily.    . clindamycin (CLEOCIN) 150 MG capsule TK 4 CS PO PRIOR TO DENTAL APPOINTMENT  0  . finasteride (PROSCAR) 5 MG tablet Take 1 tablet by mouth  daily 90 tablet 3  . furosemide (LASIX) 40 MG tablet Take 20 mg by mouth daily.     Marland Kitchen levothyroxine (SYNTHROID, LEVOTHROID) 50 MCG tablet Take 1 tablet (50 mcg total) by mouth daily. 90 tablet 3  . losartan (COZAAR) 100 MG tablet Take 1 tablet (100 mg total) by mouth daily. 90 tablet 3  . metoprolol tartrate (LOPRESSOR) 25 MG tablet Take 1 tablet (25 mg total) by mouth 2 (two) times daily. 180 tablet 3  . Multiple Vitamins-Minerals (PRESERVISION AREDS 2 PO) Take 1 capsule by mouth 2 (two) times daily.     Marland Kitchen PARoxetine (PAXIL) 20 MG tablet Take 0.5 tablets (10 mg total) by mouth every morning. 45 tablet 3  . potassium chloride (K-DUR) 10 MEQ tablet Take 1 tablet (10 mEq total) by mouth daily. (Patient not taking: Reported on 12/16/2015) 30 tablet 3   No current facility-administered medications for this visit.    Allergies: Allergies  Allergen Reactions  . Amoxicillin-Pot  Clavulanate Itching  . Morphine And Related Nausea And Vomiting  . Penicillins Hives    Social History: The patient  reports that he quit smoking about 35 years ago. His smoking use included Cigarettes. He has a 15 pack-year smoking history. He does not have any smokeless tobacco history on file. He reports that he drinks about 4.2 oz of alcohol per week. He reports that he does not  use illicit drugs.   Family History: The patient's family history includes Aneurysm in his father; Cancer in his brother, daughter, and mother; Colon cancer in his mother; Heart attack in his brother; Heart disease in his brother; Prostate cancer in his brother.   Review of Systems: Please see the history of present illness.   Otherwise, the review of systems is positive for none.   All other systems are reviewed and negative.   Physical Exam: VS:  BP 168/84 mmHg  Ht 5\' 6"  (1.676 m)  Wt 190 lb 6.4 oz (86.365 kg)  BMI 30.75 kg/m2 .  BMI Body mass index is 30.75 kg/(m^2).  Wt Readings from Last 3 Encounters:  12/16/15 190 lb 6.4 oz (86.365 kg)  10/12/15 186 lb 12.8 oz (84.732 kg)  08/09/15 189 lb 6.4 oz (85.911 kg)    General: He is quite argumentative today. Cursing. Elderly male who is alert and in no acute distress.  HEENT: Normal. Neck: Supple, no JVD, carotid bruits, or masses noted.  Cardiac: Regular rate and rhythm. Harsh murmur noted. No edema.  Respiratory:  Lungs are clear to auscultation bilaterally with normal work of breathing.  GI: Soft and nontender.  MS: No deformity or atrophy. Gait and ROM intact. Skin: Warm and dry. Color is normal.  Neuro:  Strength and sensation are intact and no gross focal deficits noted.  Psych: Alert, appropriate and with normal affect.   LABORATORY DATA:  EKG:  EKG is not ordered today.  Lab Results  Component Value Date   WBC 7.9 10/12/2015   HGB 14.9 10/12/2015   HCT 43.5 10/12/2015   PLT 156 10/12/2015   GLUCOSE 95 10/12/2015   CHOL 122  01/11/2015   TRIG 119.0 01/11/2015   HDL 28.20* 01/11/2015   LDLCALC 70 01/11/2015   ALT 51 01/11/2015   AST 46* 01/11/2015   NA 144 10/12/2015   K 3.9 10/12/2015   CL 104 10/12/2015   CREATININE 1.69* 10/12/2015   BUN 31* 10/12/2015   CO2 28 10/12/2015   TSH 3.199 06/28/2015   INR 1.25 03/23/2014   HGBA1C 6.3* 03/19/2014    BNP (last 3 results)  Recent Labs  06/28/15 1419 10/12/15 1527  BNP 126.5* 53.3    ProBNP (last 3 results) No results for input(s): PROBNP in the last 8760 hours.   Other Studies Reviewed Today:  Echo Study Conclusions from 10/2015  - Left ventricle: The cavity size was normal. Systolic function was  normal. The estimated ejection fraction was in the range of 60%  to 65%. Wall motion was normal; there were no regional wall  motion abnormalities. There was an increased relative  contribution of atrial contraction to ventricular filling.  Doppler parameters are consistent with abnormal left ventricular  relaxation (grade 1 diastolic dysfunction). - Aortic valve: S/P bovine bioprosthesis followed by valve in valve  TAVR which appears to be functioning normally. The mean AV  gradient across the valve is elevated at 33mmHg but no change  from prior echo. Mean gradient (S): 35 mm Hg. Valve area (VTI):  0.94 cm^2. Valve area (Vmax): 1.05 cm^2. Valve area (Vmean): 0.84  cm^2. - Mitral valve: Mildly calcified annulus.   ASSESSMENT AND PLAN: 1. Aortic valve disease s/p valve-in-valve TAVR: Pt with NYHA II/II symptoms of exertional dyspnea. Would continue with his current management.   2. CAD - stable without symptoms of angina - would favor conservative management.   3. HTN - BP is up today but I think it is  just a reflection of his mood.   4. Situational stress/grief. I think he is just ready to die. Discussed at length today. I got him to agree to go see Dr. Jacelyn Grip.   Current medicines are reviewed with the patient today.  The  patient does not have concerns regarding medicines other than what has been noted above.  The following changes have been made:  See above.  Labs/ tests ordered today include:    Orders Placed This Encounter  Procedures  . Basic metabolic panel  . CBC  . Brain natriuretic peptide     Disposition:   FU with Dr. Burt Knack in 6 months.   Patient is agreeable to this plan and will call if any problems develop in the interim.   Signed: Burtis Junes, RN, ANP-C 12/16/2015 12:08 PM  [facility] [address] [place], [place]  [address] Phone: [phone] Fax: [phone]

## 2016-04-10 ENCOUNTER — Encounter: Payer: Self-pay | Admitting: Family

## 2016-04-16 ENCOUNTER — Ambulatory Visit (HOSPITAL_COMMUNITY)
Admission: RE | Admit: 2016-04-16 | Discharge: 2016-04-16 | Disposition: A | Discharge status: Home / Self Care | Payer: Medicare Other | Source: Ambulatory Visit | Attending: Family | Admitting: Family

## 2016-04-16 ENCOUNTER — Telehealth: Payer: Self-pay | Admitting: Nurse Practitioner

## 2016-04-16 ENCOUNTER — Ambulatory Visit (INDEPENDENT_AMBULATORY_CARE_PROVIDER_SITE_OTHER): Payer: Medicare Other | Admitting: Family

## 2016-04-16 ENCOUNTER — Encounter: Payer: Self-pay | Admitting: Family

## 2016-04-16 VITALS — BP 137/79 | HR 60 | Temp 97.4°F | Resp 20 | Ht 66.0 in | Wt 191.0 lb

## 2016-04-16 DIAGNOSIS — Z95828 Presence of other vascular implants and grafts: Secondary | ICD-10-CM | POA: Diagnosis not present

## 2016-04-16 DIAGNOSIS — Z4889 Encounter for other specified surgical aftercare: Secondary | ICD-10-CM

## 2016-04-16 DIAGNOSIS — Z48812 Encounter for surgical aftercare following surgery on the circulatory system: Secondary | ICD-10-CM

## 2016-04-16 DIAGNOSIS — R0989 Other specified symptoms and signs involving the circulatory and respiratory systems: Secondary | ICD-10-CM | POA: Diagnosis not present

## 2016-04-16 DIAGNOSIS — I714 Abdominal aortic aneurysm, without rupture, unspecified: Secondary | ICD-10-CM

## 2016-04-16 DIAGNOSIS — I259 Chronic ischemic heart disease, unspecified: Secondary | ICD-10-CM | POA: Diagnosis not present

## 2016-04-16 NOTE — Telephone Encounter (Signed)
New Message:      Please call,pt have been having shortness of breath. His other doctor wants him to be seen asap.

## 2016-04-16 NOTE — Telephone Encounter (Signed)
S/w pt was getting a test done today and stated doctor stated needed to be seen by our office and not wait till November with Dr. Burt Knack.  Stated has lots of flatulence Was told to take GAS X. Cecille Rubin will see pt tomorrow.

## 2016-04-16 NOTE — Patient Instructions (Signed)
Before your next abdominal ultrasound:  Take two Extra-Strength Gas-X capsules at bedtime the night before the test. Take another two Extra-Strength Gas-X capsules 3 hours before the test.   

## 2016-04-16 NOTE — Progress Notes (Signed)
[facility] [place]   CC: Follow up s/p EVAR  History of Present Illness  [name] [name] is a 82 y.o. (11/25/1922) male patient of Dr. Trula Slade who is back today for followup. He is status post endovascular repair of abdominal aortic aneurysm by Dr. Amedeo Plenty in 11/01/2002 using a AneuRx stent graft. He has no complaints. He denies back pain. He denies abdominal pain.  Dr. Trula Slade last saw pt on 04/06/13. At that time the maximum excluded sac diameter was 3.4 cm which was different from the prior study and more consistent with his previous studies The patient's aneurysm continued to decrease in size. Dr. Trula Slade suspected the study 6 months prior was inaccurate.  He had a repeat aortic valve replacement in 2013-10-31. He takes no anticoagulants. He does take a daily 81 mg ASA.  He takes Lipitor and states "Im sure that affects my muscles, I can get cramps in my muscles anywhere."  The patient denies claudication in legs with walking. The patient reports history of stroke about 31-Oct-2004, denies residual speech difficulties, deneis hemiparesis, denies monocular loss of vision.  He recently had basal cell skin cancer resected from his scalp, has a large dressing in place. His wife died in 01-Nov-2015 while in a nursing home.   He is concerned about his intermittent dyspnea, states it is not getting worse.   Pt Diabetic: No Pt smoker: former smoker, quit about 1982   Past Medical History:  Diagnosis Date  . AAA (abdominal aortic aneurysm) ([place])    s/p endovascular repair 11-01-2002  . Aortic insufficiency    Prosthetic valve dysfunction  . Aortic stenosis    s/p AVR in 2001-10-31, bovine  . Arthritis   . BCC (basal cell carcinoma) 09/17/2012  . BPH (benign prostatic hyperplasia)   . CAD (coronary artery disease)    stents in [place] and has a totally occluded RCA  . Chicken pox   . CVA (cerebral infarction) 12/09  . Depression   . Diverticulosis   . Glaucoma    recently told by Dr Janyth Contes  he did not have it  . HTN (hypertension)   . Hyperlipemia   . Leaky heart valve   . Measles   . Mumps   . Myocardial infarction ([place])   . Personal history of poliomyelitis 09/17/2012   In childhood  . Pneumonia    hx of  . Polio   . Prosthetic valve dysfunction    Aortic stenosis and insufficiency   . Right iliac artery stenosis (HCC)   . 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 Shortness of breath    exertion, orthopnea  . Sleeping difficulties   . Stroke [facility])    no residual  . Thyroid disease    Past Surgical History:  Procedure Laterality Date  . ABDOMINAL AORTIC ANEURYSM REPAIR  2002/11/01   s/p endovascular repiar of AAA in 11-01-2002  . AORTIC VALVE REPLACEMENT  Oct 31, 2001   #55mm pericardial valve   . APPENDECTOMY    . CARDIAC CATHETERIZATION    . CORONARY ANGIOPLASTY    . CORONARY ARTERY BYPASS GRAFT    . CORONARY STENT PLACEMENT    . INTRAOPERATIVE TRANSESOPHAGEAL ECHOCARDIOGRAM N/A 03/23/2014   Procedure: INTRAOPERATIVE TRANSESOPHAGEAL ECHOCARDIOGRAM;  Surgeon: Sherren Mocha, MD;  Location: [facility] OR;  Service: Open Heart Surgery;  Laterality: N/A;  . JOINT REPLACEMENT    . LEFT AND  RIGHT HEART CATHETERIZATION WITH CORONARY ANGIOGRAM N/A 02/08/2014   Procedure: LEFT AND RIGHT HEART CATHETERIZATION WITH CORONARY ANGIOGRAM;  Surgeon: Blane Ohara, MD;  Location: [facility] CATH LAB;  Service: Cardiovascular;  Laterality: N/A;  . TEE WITHOUT CARDIOVERSION N/A 09/02/2013   Procedure: TRANSESOPHAGEAL ECHOCARDIOGRAM (TEE);  Surgeon: Dorothy Spark, MD;  Location: [place];  Service: Cardiovascular;  Laterality: N/A;  . TONSILLECTOMY    . TRANSCATHETER AORTIC VALVE REPLACEMENT, TRANSFEMORAL N/A 03/23/2014   Procedure: TRANSCATHETER AORTIC VALVE REPLACEMENT, TRANSFEMORAL;  Surgeon: Sherren Mocha, MD;  Location: [place];  Service: Open Heart Surgery;  Laterality: N/A;  valve in valve  TAVR  . WISDOM TOOTH EXTRACTION     Social History Social History   Social History  . Marital status: Married    Spouse name: N/A  . Number of children: N/A  . Years of education: N/A   Occupational History  . retired Retired   Social History Main Topics  . Smoking status: Former Smoker    Packs/day: 0.50    Years: 30.00    Types: Cigarettes    Quit date: 10/04/1980  . Smokeless tobacco: Not on file  . Alcohol use 4.2 oz/week    7 Shots of liquor per week     Comment: cocktails/night  . Drug use: No  . Sexual activity: Not Currently   Other Topics Concern  . Not on file   Social History Narrative  . No narrative on file   Family History Family History  Problem Relation Age of Onset  . Colon cancer Mother   . Cancer Mother   . Aneurysm Father   . Heart attack Brother     CHF  . Prostate cancer Brother   . Cancer Brother   . Heart disease Brother   . Cancer Daughter     Current Outpatient Prescriptions on File Prior to Visit  Medication Sig Dispense Refill  . aspirin 81 MG tablet Take 81 mg by mouth daily.    Marland Kitchen atorvastatin (LIPITOR) 80 MG tablet Take 1 tablet by mouth  daily 90 tablet 3  . B Complex-C (SUPER B COMPLEX) TABS Take 1 tablet by mouth daily.    . clindamycin (CLEOCIN) 150 MG capsule TK 4 CS PO PRIOR TO DENTAL APPOINTMENT  0  . finasteride (PROSCAR) 5 MG tablet Take 1 tablet by mouth  daily 90 tablet 3  . furosemide (LASIX) 40 MG tablet Take 20 mg by mouth daily.     Marland Kitchen levothyroxine (SYNTHROID, LEVOTHROID) 50 MCG tablet Take 1 tablet (50 mcg total) by mouth daily. 90 tablet 3  . losartan (COZAAR) 100 MG tablet Take 1 tablet (100 mg total) by mouth daily. 90 tablet 3  . metoprolol tartrate (LOPRESSOR) 25 MG tablet Take 1 tablet (25 mg total) by mouth 2 (two) times daily. 180 tablet 3  . Multiple Vitamins-Minerals (PRESERVISION AREDS 2 PO) Take 1 capsule by mouth 2 (two) times daily.     Marland Kitchen PARoxetine (PAXIL) 20 MG tablet Take 0.5 tablets (10 mg total)  by mouth every morning. 45 tablet 3  . potassium chloride (K-DUR) 10 MEQ tablet Take 1 tablet (10 mEq total) by mouth daily. 30 tablet 3   No current facility-administered medications on file prior to visit.    Allergies  Allergen Reactions  . Amoxicillin-Pot Clavulanate Itching  . Morphine And Related Nausea And Vomiting  . Penicillins Hives    ROS: See HPI for pertinent positives and negatives.  Physical Examination  Vitals:  04/16/16 0917  BP: 137/79  Pulse: 60  Resp: 20  Temp: 97.4 F (36.3 C)  TempSrc: Oral  SpO2: 90%  Weight: 191 lb (86.6 kg)  Height: 5\' 6"  (1.676 m)   Body mass index is 30.83 kg/m.  General: A&O x 3, WD, obese male.  Pulmonary: Sym exp, fair air movt, CTAB, no rales, rhonchi, or wheezing. Respirations are moderately labored when he puts on his socks and shoes, not labored at rest.  Cardiac: RRR, Nl S1, S2, no detected murmur.   Carotid Bruits Right Left   Positive Positive   Aorta is not palpable Radial pulses are 2+ palpable and =                          VASCULAR EXAM:                                                                                                                                           LE Pulses Right Left       FEMORAL not palpable (obese)  not palpable (obese)       POPLITEAL  not palpable  not palpable       POSTERIOR TIBIAL  2+ palpable  2+ palpable       DORSALIS PEDIS      ANTERIOR TIBIAL not palpable not palpable     Gastrointestinal: soft, NTND, -G/R, - HSM, - masses palpated, - CVAT B.  Musculoskeletal: M/S 5/5 throughout, Extremities without ischemic changes.  Neurologic: CN 2-12 intact  except is hard of hearing, Pain and light touch intact in extremities are intact, Motor exam as listed above.   03/19/14 carotid arteries duplex at [facility]:  Bilateral - 1% to 39% ICA stenosis left greater than right. Vertebral artery fow is antegrade. - Palmar arch evaluation - Doppler waveforms  remained normal bilaterally with both radial and ulnar compressions.   02/18/14 CTA abd/pelvis requested by Dr. Sherren Mocha:  Extensive atherosclerosis throughout the abdominal and pelvic vasculature, including vascular findings and measurements pertinent to potential TAVR procedure, as detailed below. Post procedural changes of aortobi-iliac endograft are noted, originating immediately beneath the level the renal artery origins extending into the mid common iliac arteries bilaterally. The aortic and iliac portions of the graft are all widely patent, without evidence of in stent restenosis. The excluded aneurysm sac currently measures 3.7 x 4.0 cm. No high attenuation is identified within the excluded aneurysm sac to suggest presence of endoleak on today's arterial phase examination.     Medical Decision Making  The patient is a 82 y.o. male who is s/p endovascular aortic repair in 2004. He has no back or abdominal pain. He is concerned about his intermittent dyspnea that started about mid 2017, states is not worsening; I advised him to discuss this with his cardiologist as soon as possible, states he is  scheduled to see Dr. Burt Knack in November 2017.  DATA Today's EVAR duplex suggests a decrease in the maximum diameter of the abdominal aorta (3.5 cm today) when compared to the previous exam (4.01 cm on 04/11/15). Measurements today are consistent with an earlier study of 04/06/13. No endo leak detected. This is based on limited visualization of the abdominal vasculature due to overlying bowel gas and patient body habitus.   Bilateral carotid artery bruits; carotid duplex in July 2015 indicates minimal bilateral ICA stenoses. He had a stroke about 2006, none subsequently.    Based on this patient's exam and diagnostic studies, the patient will follow up in 1 year  with the following studies: EVAR duplex and carotid duplex.  Consideration for repair of AAA would be made when  the size is 5.5 cm, growth > 1 cm/yr, and symptomatic status.  I emphasized the importance of maximal medical management including strict control of blood pressure, blood glucose, and lipid levels, antiplatelet agents, obtaining regular exercise, and continued  cessation of smoking.   The patient was given information about AAA including signs, symptoms, treatment, and how to minimize the risk of enlargement and rupture of aneurysms.    The patient was advised to call 911 should the patient experience sudden onset abdominal or back pain.   Thank you for allowing [place] to participate in this patient's care.  Clemon Chambers, RN, MSN, FNP-C [facility] [place] Office: [facility] Physician: Trula Slade  04/16/2016, 9:41 AM

## 2016-04-17 ENCOUNTER — Ambulatory Visit
Admission: RE | Admit: 2016-04-17 | Discharge: 2016-04-17 | Disposition: A | Discharge status: Home / Self Care | Payer: Medicare Other | Source: Ambulatory Visit | Attending: Nurse Practitioner | Admitting: Nurse Practitioner

## 2016-04-17 ENCOUNTER — Encounter: Payer: Self-pay | Admitting: Nurse Practitioner

## 2016-04-17 ENCOUNTER — Ambulatory Visit (INDEPENDENT_AMBULATORY_CARE_PROVIDER_SITE_OTHER): Payer: Medicare Other | Admitting: Nurse Practitioner

## 2016-04-17 VITALS — BP 160/110 | HR 57 | Resp 91 | Wt 191.8 lb

## 2016-04-17 DIAGNOSIS — I259 Chronic ischemic heart disease, unspecified: Secondary | ICD-10-CM

## 2016-04-17 DIAGNOSIS — R0600 Dyspnea, unspecified: Secondary | ICD-10-CM

## 2016-04-17 DIAGNOSIS — Z954 Presence of other heart-valve replacement: Secondary | ICD-10-CM | POA: Diagnosis not present

## 2016-04-17 DIAGNOSIS — E785 Hyperlipidemia, unspecified: Secondary | ICD-10-CM | POA: Diagnosis not present

## 2016-04-17 DIAGNOSIS — Z952 Presence of prosthetic heart valve: Secondary | ICD-10-CM

## 2016-04-17 LAB — BASIC METABOLIC PANEL
BUN: 24 mg/dL (ref 7–25)
CO2: 28 mmol/L (ref 20–31)
Calcium: 8.7 mg/dL (ref 8.6–10.3)
Chloride: 106 mmol/L (ref 98–110)
Creat: 1.35 mg/dL — ABNORMAL HIGH (ref 0.70–1.11)
Glucose, Bld: 123 mg/dL — ABNORMAL HIGH (ref 65–99)
Potassium: 4.4 mmol/L (ref 3.5–5.3)
Sodium: 143 mmol/L (ref 135–146)

## 2016-04-17 LAB — BRAIN NATRIURETIC PEPTIDE: Brain Natriuretic Peptide: 70.5 pg/mL (ref <100)

## 2016-04-17 LAB — CBC
HCT: 41.7 % (ref 38.5–50.0)
Hemoglobin: 14.2 g/dL (ref 13.2–17.1)
MCH: 32.1 pg (ref 27.0–33.0)
MCHC: 34.1 g/dL (ref 32.0–36.0)
MCV: 94.1 fL (ref 80.0–100.0)
MPV: 10.8 fL (ref 7.5–12.5)
Platelets: 148 10*3/uL (ref 140–400)
RBC: 4.43 MIL/uL (ref 4.20–5.80)
RDW: 13 % (ref 11.0–15.0)
WBC: 7.1 10*3/uL (ref 3.8–10.8)

## 2016-04-17 LAB — LIPID PANEL
Cholesterol: 161 mg/dL (ref 125–200)
HDL: 33 mg/dL — ABNORMAL LOW (ref >=40)
LDL Cholesterol: 88 mg/dL (ref <130)
Total CHOL/HDL Ratio: 4.9 Ratio (ref <=5.0)
Triglycerides: 199 mg/dL — ABNORMAL HIGH (ref <150)
VLDL: 40 mg/dL — ABNORMAL HIGH (ref <30)

## 2016-04-17 LAB — HEPATIC FUNCTION PANEL
ALT: 19 U/L (ref 9–46)
AST: 24 U/L (ref 10–35)
Albumin: 3.6 g/dL (ref 3.6–5.1)
Alkaline Phosphatase: 98 U/L (ref 40–115)
Bilirubin, Direct: 0.2 mg/dL (ref <=0.2)
Indirect Bilirubin: 0.9 mg/dL (ref 0.2–1.2)
Total Bilirubin: 1.1 mg/dL (ref 0.2–1.2)
Total Protein: 6.1 g/dL (ref 6.1–8.1)

## 2016-04-17 MED ORDER — AZITHROMYCIN 250 MG PO TABS: ORAL_TABLET | ORAL | 0 refills | Status: DC

## 2016-04-17 NOTE — Progress Notes (Signed)
CARDIOLOGY OFFICE NOTE  Date:  04/17/2016    [name] [name] Date of Birth: [birth year] Medical Record [id number]  PCP:  Anthoney Harada, MD  Cardiologist:  Jerel Shepherd  Chief Complaint  Patient presents with  . Shortness of Breath    Work in visit - seen for Dr. Burt Knack    History of Present Illness: [name] [name] is a 82 y.o. male who presents today for a work in visit. Seen for Dr. Burt Knack.   He has CAD with remote inferior MI, past stent to the LCX and has a totally occluded RCA. Had prior AVR by Dr. Cyndia Bent in 2003, past AAA repair in 2004, PVD, past CVA, HLD, BPH and hypothyroidism. He developed severe bioprosthetic valve aortic insufficiency with worsening heart failure and was ultimately treated with valve and valve TAVR using a 23 mm Edwards Sapian XT valve via a transfemoral approach on 03/23/2014.  Last seen in September by Dr. Burt Knack and felt to be doing ok.   I saw him back in December & January - wife had been placed in the nursing home. He was not adjusting at all - not active, angry, etc. Getting too much salt. Eating out. Weight up. BP was up and I adjusted his medicines. Seen back in January- more short of breath - got him back on Lasix.  Did get his echo updated in early April. Last seen by me back in 08-Dec-2022 - his wife had died - he was pretty belligerent - had stopped some of his medicines - wanting to die.   Called yesterday - noted [facility] - complained of being short of breath intermittently. Thus added to my schedule for today.   Comes back today. Here with [name] [name] his son. He has had a large basal cell carcinoma removed from the left side of his head this week - has it bandaged up. Coughing up lots of phlegm - yellow in the day but darker in the AM. More short of breath. Some swelling in his ankles. Hard to say how much salt he is getting. No chest pain. Balance is off. No falls. Trying to get out more. [name] [name] notes [place].   Past Medical History:  Diagnosis Date  . AAA (abdominal aortic aneurysm) ([place])    s/p endovascular repair 2004  . Aortic insufficiency    Prosthetic valve dysfunction  . Aortic stenosis    s/p AVR in 2003, bovine  . Arthritis   . BCC (basal cell carcinoma) 09/17/2012  . BPH (benign prostatic hyperplasia)   . CAD (coronary artery disease)    stents in [place] and has a totally occluded RCA  . Chicken pox   . CVA (cerebral infarction) 12/09  . Depression   . Diverticulosis   . Glaucoma    recently told by Dr Janyth Contes he did not have it  . HTN (hypertension)   . Hyperlipemia   . Leaky heart valve   . Measles   . Mumps   . Myocardial infarction ([place])   . Personal history of poliomyelitis 09/17/2012   In childhood  . Pneumonia    hx of  . Polio   . Prosthetic valve dysfunction    Aortic stenosis and insufficiency   . Right iliac artery stenosis (HCC)   . 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 Shortness of breath    exertion, orthopnea  . Sleeping difficulties   . Stroke [facility])    no residual  . Thyroid disease     Past Surgical History:  Procedure Laterality Date  . ABDOMINAL AORTIC ANEURYSM REPAIR  2004   s/p endovascular repiar of AAA in 2004  . AORTIC VALVE REPLACEMENT  2003   #23mm pericardial valve   . APPENDECTOMY    . CARDIAC CATHETERIZATION    . CORONARY ANGIOPLASTY    . CORONARY ARTERY BYPASS GRAFT    . CORONARY STENT PLACEMENT    . INTRAOPERATIVE TRANSESOPHAGEAL ECHOCARDIOGRAM N/A 03/23/2014   Procedure: INTRAOPERATIVE TRANSESOPHAGEAL ECHOCARDIOGRAM;  Surgeon: Sherren Mocha, MD;  Location: [facility] OR;  Service: Open Heart Surgery;  Laterality: N/A;  . JOINT REPLACEMENT    . LEFT AND RIGHT HEART CATHETERIZATION WITH CORONARY ANGIOGRAM N/A 02/08/2014   Procedure: LEFT AND RIGHT HEART CATHETERIZATION WITH CORONARY  ANGIOGRAM;  Surgeon: Blane Ohara, MD;  Location: [facility] CATH LAB;  Service: Cardiovascular;  Laterality: N/A;  . TEE WITHOUT CARDIOVERSION N/A 09/02/2013   Procedure: TRANSESOPHAGEAL ECHOCARDIOGRAM (TEE);  Surgeon: Dorothy Spark, MD;  Location: [place];  Service: Cardiovascular;  Laterality: N/A;  . TONSILLECTOMY    . TRANSCATHETER AORTIC VALVE REPLACEMENT, TRANSFEMORAL N/A 03/23/2014   Procedure: TRANSCATHETER AORTIC VALVE REPLACEMENT, TRANSFEMORAL;  Surgeon: Sherren Mocha, MD;  Location: [place];  Service: Open Heart Surgery;  Laterality: N/A;  valve in valve TAVR  . WISDOM TOOTH EXTRACTION       Medications: Current Outpatient Prescriptions  Medication Sig Dispense Refill  . aspirin 81 MG tablet Take 81 mg by mouth daily.    Marland Kitchen atorvastatin (LIPITOR) 80 MG tablet Take 1 tablet by mouth  daily 90 tablet 3  . B Complex-C (SUPER B COMPLEX) TABS Take 1 tablet by mouth daily.    . clindamycin (CLEOCIN) 150 MG capsule TK 4 CS PO PRIOR TO DENTAL APPOINTMENT  0  . finasteride (PROSCAR) 5 MG tablet Take 1 tablet by mouth  daily 90 tablet 3  . furosemide (LASIX) 40 MG tablet Take 20 mg by mouth daily.     Marland Kitchen levothyroxine (SYNTHROID, LEVOTHROID) 50 MCG tablet Take 1 tablet (50 mcg total) by mouth daily. 90 tablet 3  . losartan (COZAAR) 100 MG tablet Take 1 tablet (100 mg total) by mouth daily. 90 tablet 3  . metoprolol tartrate (LOPRESSOR) 25 MG tablet Take 1 tablet (25 mg total) by mouth 2 (two) times daily. 180 tablet 3  . Multiple Vitamins-Minerals (PRESERVISION AREDS 2 PO) Take 1 capsule by mouth 2 (two) times daily.     Marland Kitchen PARoxetine (PAXIL) 20 MG tablet Take 0.5 tablets (10 mg total) by mouth every morning. 45 tablet 3  . potassium chloride (K-DUR) 10 MEQ tablet Take 1 tablet (10 mEq total) by mouth daily. 30 tablet 3  . azithromycin (ZITHROMAX) 250 MG tablet Take 2 pills today and then 1 pill daily for 4 days and then stop. 6 each 0   No current facility-administered medications for  this visit.     Allergies: Allergies  Allergen Reactions  . Amoxicillin-Pot Clavulanate Itching  . Morphine And Related Nausea And Vomiting  . Penicillins Hives    Social History: The patient  reports that he quit smoking about 35 years ago. His smoking use included Cigarettes. He has a 15.00 pack-year smoking history. He does not have any smokeless tobacco history on file. He reports that he drinks about 4.2 oz  of alcohol per week . He reports that he does not use drugs.   Family History: The patient's family history includes Aneurysm in his father; Cancer in his brother, daughter, and mother; Colon cancer in his mother; Heart attack in his brother; Heart disease in his brother; Prostate cancer in his brother.   Review of Systems: Please see the history of present illness.   Otherwise, the review of systems is positive for none.   All other systems are reviewed and negative.   Physical Exam: VS:  BP (!) 160/110   Pulse (!) 57   Resp (!) 91 Comment: resting/walking 89  Wt 191 lb 12.8 oz (87 kg)   BMI 30.96 kg/m  .  BMI Body mass index is 30.96 kg/m.  Wt Readings from Last 3 Encounters:  04/17/16 191 lb 12.8 oz (87 kg)  04/16/16 191 lb (86.6 kg)  12/16/15 190 lb 6.4 oz (86.4 kg)   BP by me is 160/100   General: Not as angry today. He is alert and in no acute distress. Little wheezing with walking here in the office. Oxygen sat drops to 89% with walking.   HEENT: Normal.  Neck: Supple, no JVD, carotid bruits, or masses noted.  Cardiac: Regular rate and rhythm. No murmurs, rubs, or gallops. Trace edema.  Respiratory:  Lungs are clear to auscultation bilaterally with normal work of breathing.  GI: Soft and nontender.  MS: No deformity or atrophy. Gait and ROM intact.  Skin: Warm and dry. Color is normal.  Neuro:  Strength and sensation are intact and no gross focal deficits noted.  Psych: Alert, appropriate and with normal affect.   LABORATORY DATA:  EKG:  EKG is not  ordered today.  Lab Results  Component Value Date   WBC 7.6 12/16/2015   HGB 14.5 12/16/2015   HCT 41.8 12/16/2015   PLT 130 (L) 12/16/2015   GLUCOSE 78 12/16/2015   CHOL 122 01/11/2015   TRIG 119.0 01/11/2015   HDL 28.20 (L) 01/11/2015   LDLCALC 70 01/11/2015   ALT 51 01/11/2015   AST 46 (H) 01/11/2015   NA 141 12/16/2015   K 4.4 12/16/2015   CL 105 12/16/2015   CREATININE 1.24 (H) 12/16/2015   BUN 21 12/16/2015   CO2 27 12/16/2015   TSH 3.199 06/28/2015   INR 1.25 03/23/2014   HGBA1C 6.3 (H) 03/19/2014    BNP (last 3 results)  Recent Labs  06/28/15 1419 10/12/15 1527 12/16/15 1213  BNP 126.5* 53.3 175.1*    ProBNP (last 3 results) No results for input(s): PROBNP in the last 8760 hours.   Other Studies Reviewed Today:  Echo Study Conclusions from 10/2015  - Left ventricle: The cavity size was normal. Systolic function was  normal. The estimated ejection fraction was in the range of 60%  to 65%. Wall motion was normal; there were no regional wall  motion abnormalities. There was an increased relative  contribution of atrial contraction to ventricular filling.  Doppler parameters are consistent with abnormal left ventricular  relaxation (grade 1 diastolic dysfunction). - Aortic valve: S/P bovine bioprosthesis followed by valve in valve  TAVR which appears to be functioning normally. The mean AV  gradient across the valve is elevated at 63mmHg but no change  from prior echo. Mean gradient (S): 35 mm Hg. Valve area (VTI):  0.94 cm^2. Valve area (Vmax): 1.05 cm^2. Valve area (Vmean): 0.84  cm^2. - Mitral valve: Mildly calcified annulus.   ASSESSMENT AND PLAN: 1. Aortic valve  disease s/p valve-in-valve TAVR: Pt with NYHA II/II symptoms of exertional dyspnea. Weight is up some. More short of breath. Will get CXR and increase his diuretics. ? If he has a component of bronchitis- will give him a round of antibiotics. Lab today.   2. CAD - stable  without symptoms of angina - would favor conservative management.   3. HTN - BP is up today - [facility] - will increase the Lasix. May need additional antihypertensive on return.   4. Situational stress/grief - does not seem as mad - but probably still a fair amount of depression.   Current medicines are reviewed with the patient today.  The patient does not have concerns regarding medicines other than what has been noted above.  The following changes have been made:  See above.  Labs/ tests ordered today include:    Orders Placed This Encounter  Procedures  . DG Chest 2 View  . Brain natriuretic peptide  . Basic metabolic panel  . CBC  . Hepatic function panel  . Lipid panel     Disposition:   FU with me in one week.   Patient is agreeable to this plan and will call if any problems develop in the interim.   Signed: Burtis Junes, RN, ANP-C 04/17/2016 11:38 AM  [facility] [address] [place], [place]  [address] Phone: [phone] Fax: [phone]

## 2016-04-17 NOTE — Patient Instructions (Addendum)
We will be checking the following labs today - BMET, BNP, CBC, HPF & Lipids  Please go to [facility] to [place] on the first floor for a chest Xray - you may walk in.     Medication Instructions:    Continue with your current medicines. BUT  I am increasing the Lasix to a whole pill each day for one week  I am going to give you a round of antibiotics - Zpak - to take as directed - this is at the [facility]     Testing/Procedures To Be Arranged:  N/A  Follow-Up:   See me in one week    Other Special Instructions:   N/A    If you need a refill on your cardiac medications before your next appointment, please call your pharmacy.   Call the [place] office at [phone] if you have any questions, problems or concerns.

## 2016-04-24 ENCOUNTER — Ambulatory Visit (INDEPENDENT_AMBULATORY_CARE_PROVIDER_SITE_OTHER): Payer: Medicare Other | Admitting: Nurse Practitioner

## 2016-04-24 ENCOUNTER — Encounter: Payer: Self-pay | Admitting: Nurse Practitioner

## 2016-04-24 VITALS — BP 120/80 | HR 64 | Wt 190.4 lb

## 2016-04-24 DIAGNOSIS — I10 Essential (primary) hypertension: Secondary | ICD-10-CM | POA: Diagnosis not present

## 2016-04-24 DIAGNOSIS — I259 Chronic ischemic heart disease, unspecified: Secondary | ICD-10-CM | POA: Diagnosis not present

## 2016-04-24 DIAGNOSIS — I38 Endocarditis, valve unspecified: Secondary | ICD-10-CM | POA: Diagnosis not present

## 2016-04-24 DIAGNOSIS — Z952 Presence of prosthetic heart valve: Secondary | ICD-10-CM

## 2016-04-24 LAB — BASIC METABOLIC PANEL
BUN: 27 mg/dL — ABNORMAL HIGH (ref 7–25)
CO2: 28 mmol/L (ref 20–31)
Calcium: 8.7 mg/dL (ref 8.6–10.3)
Chloride: 103 mmol/L (ref 98–110)
Creat: 1.31 mg/dL — ABNORMAL HIGH (ref 0.70–1.11)
Glucose, Bld: 149 mg/dL — ABNORMAL HIGH (ref 65–99)
Potassium: 3.6 mmol/L (ref 3.5–5.3)
Sodium: 142 mmol/L (ref 135–146)

## 2016-04-24 NOTE — Progress Notes (Signed)
CARDIOLOGY OFFICE NOTE  Date:  04/24/2016    [name] [name] Date of Birth: Apr 11, 1923 Medical Record [id number]  PCP:  Anthoney Harada, MD  Cardiologist:  Jerel Shepherd    Chief Complaint  Patient presents with  . Shortness of Breath    Follow up visit - seen for Dr. Burt Knack    History of Present Illness: [name] [name] is a 82 y.o. male who presents today for a one week check. Seen for Dr. Burt Knack.   He has CAD with remote inferior MI, past stent to the LCX and has a totally occluded RCA. Had prior AVR by Dr. Cyndia Bent in 2003, past AAA repair in 2004, PVD, past CVA, HLD, BPH and hypothyroidism. He developed severe bioprosthetic valve aortic insufficiency with worsening heart failure and was ultimately treated with valve and valve TAVR using a 23 mm Edwards Sapian XT valve via a transfemoral approach on 03/23/2014.  Last seen in September by Dr. Burt Knack and felt to be doing ok.   I saw him back in December & January - wife had been placed in the nursing home. He was not adjusting at all - not active, angry, etc. Getting too much salt. Eating out. Weight up. BP was up and I adjusted his medicines. Seen back in January- more short of breath - got him back on Lasix.  Did get his echo updated in early April. Last seen by me back in Dec 12, 2022 - his wife had died - he was pretty belligerent - had stopped some of his medicines - wanting to die.   Called yesterday - noted [facility] had [facility] - complained of being short of breath intermittently. Thus added to my schedule for today.   Seen last week as a work in - more short of breath. Coughing. Some swelling. CXR with probable bronchitis. I increased his diuretics and gave him a round of antibiotics.   Comes back today. Here with his son, [name]. Seems a little better.   Past Medical History:  Diagnosis Date  . AAA (abdominal aortic aneurysm) ([place])    s/p endovascular repair 2004  . Aortic insufficiency    Prosthetic valve dysfunction  . Aortic stenosis    s/p AVR in 2003, bovine  . Arthritis   . BCC (basal cell carcinoma) 09/17/2012  . BPH (benign prostatic hyperplasia)   . CAD (coronary artery disease)    stents in [place] and has a totally occluded RCA  . Chicken pox   . CVA (cerebral infarction) 12/09  . Depression   . Diverticulosis   . Glaucoma    recently told by Dr Janyth Contes he did not have it  . HTN (hypertension)   . Hyperlipemia   . Leaky heart valve   . Measles   . Mumps   . Myocardial infarction   . Personal history of poliomyelitis 09/17/2012   In childhood  . Pneumonia    hx of  . Polio   . Prosthetic valve dysfunction    Aortic stenosis and insufficiency   . Right iliac artery stenosis (HCC)   . 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 Shortness of breath    exertion, orthopnea  . Sleeping difficulties   . Stroke [facility])    no residual  . Thyroid disease     Past Surgical History:  Procedure Laterality Date  .  ABDOMINAL AORTIC ANEURYSM REPAIR  2004   s/p endovascular repiar of AAA in 2004  . AORTIC VALVE REPLACEMENT  2003   #40mm pericardial valve   . APPENDECTOMY    . CARDIAC CATHETERIZATION    . CORONARY ANGIOPLASTY    . CORONARY ARTERY BYPASS GRAFT    . CORONARY STENT PLACEMENT    . INTRAOPERATIVE TRANSESOPHAGEAL ECHOCARDIOGRAM N/A 03/23/2014   Procedure: INTRAOPERATIVE TRANSESOPHAGEAL ECHOCARDIOGRAM;  Surgeon: Sherren Mocha, MD;  Location: [facility] OR;  Service: Open Heart Surgery;  Laterality: N/A;  . JOINT REPLACEMENT    . LEFT AND RIGHT HEART CATHETERIZATION WITH CORONARY ANGIOGRAM N/A 02/08/2014   Procedure: LEFT AND RIGHT HEART CATHETERIZATION WITH CORONARY ANGIOGRAM;  Surgeon: Blane Ohara, MD;  Location: [facility] CATH LAB;  Service: Cardiovascular;  Laterality: N/A;  . TEE WITHOUT CARDIOVERSION N/A 09/02/2013   Procedure: TRANSESOPHAGEAL  ECHOCARDIOGRAM (TEE);  Surgeon: Dorothy Spark, MD;  Location: [place];  Service: Cardiovascular;  Laterality: N/A;  . TONSILLECTOMY    . TRANSCATHETER AORTIC VALVE REPLACEMENT, TRANSFEMORAL N/A 03/23/2014   Procedure: TRANSCATHETER AORTIC VALVE REPLACEMENT, TRANSFEMORAL;  Surgeon: Sherren Mocha, MD;  Location: [place];  Service: Open Heart Surgery;  Laterality: N/A;  valve in valve TAVR  . WISDOM TOOTH EXTRACTION       Medications: Current Outpatient Prescriptions  Medication Sig Dispense Refill  . aspirin 81 MG tablet Take 81 mg by mouth daily.    Marland Kitchen atorvastatin (LIPITOR) 80 MG tablet Take 1 tablet by mouth  daily 90 tablet 3  . B Complex-C (SUPER B COMPLEX) TABS Take 1 tablet by mouth daily.    . cholecalciferol (VITAMIN D) 1000 units tablet Take 4,000 Units by mouth daily.    . clindamycin (CLEOCIN) 150 MG capsule TK 4 CS PO PRIOR TO DENTAL APPOINTMENT  0  . finasteride (PROSCAR) 5 MG tablet Take 1 tablet by mouth  daily 90 tablet 3  . furosemide (LASIX) 40 MG tablet Take 20 mg by mouth daily.     Marland Kitchen levothyroxine (SYNTHROID, LEVOTHROID) 50 MCG tablet Take 1 tablet (50 mcg total) by mouth daily. 90 tablet 3  . losartan (COZAAR) 100 MG tablet Take 1 tablet (100 mg total) by mouth daily. 90 tablet 3  . metoprolol tartrate (LOPRESSOR) 25 MG tablet Take 1 tablet (25 mg total) by mouth 2 (two) times daily. 180 tablet 3  . Multiple Vitamins-Minerals (PRESERVISION AREDS 2 PO) Take 1 capsule by mouth 2 (two) times daily.     Marland Kitchen PARoxetine (PAXIL) 20 MG tablet Take 0.5 tablets (10 mg total) by mouth every morning. 45 tablet 3  . potassium chloride (K-DUR) 10 MEQ tablet Take 1 tablet (10 mEq total) by mouth daily. 30 tablet 3   No current facility-administered medications for this visit.     Allergies: Allergies  Allergen Reactions  . Amoxicillin-Pot Clavulanate Itching  . Morphine And Related Nausea And Vomiting  . Penicillins Hives    Social History: The patient  reports that he  quit smoking about 35 years ago. His smoking use included Cigarettes. He has a 15.00 pack-year smoking history. He does not have any smokeless tobacco history on file. He reports that he drinks about 4.2 oz of alcohol per week . He reports that he does not use drugs.   Family History: The patient's family history includes Aneurysm in his father; Cancer in his brother, daughter, and mother; Colon cancer in his mother; Heart attack in his brother; Heart disease in his brother; Prostate cancer in his  brother.   Review of Systems: Please see the history of present illness.   Otherwise, the review of systems is positive for none.   All other systems are reviewed and negative.   Physical Exam: VS:  BP 120/80   Pulse 64   Wt 190 lb 6.4 oz (86.4 kg)   SpO2 90%   BMI 30.73 kg/m  .  BMI Body mass index is 30.73 kg/m.  Wt Readings from Last 3 Encounters:  04/24/16 190 lb 6.4 oz (86.4 kg)  04/17/16 191 lb 12.8 oz (87 kg)  04/16/16 191 lb (86.6 kg)    General: Pleasant. Well developed, well nourished and in no acute distress.   HEENT: Normal.  Neck: Supple, no JVD, carotid bruits, or masses noted.  Cardiac: Regular rate and rhythm. No murmurs, rubs, or gallops. No edema.  Respiratory:  Lungs are clear to auscultation bilaterally with normal work of breathing.  GI: Soft and nontender.  MS: No deformity or atrophy. Gait and ROM intact.  Skin: Warm and dry. Color is normal.  Neuro:  Strength and sensation are intact and no gross focal deficits noted.  Psych: Alert, appropriate and with normal affect.   LABORATORY DATA:  EKG:  EKG is not ordered today.  Lab Results  Component Value Date   WBC 7.1 04/17/2016   HGB 14.2 04/17/2016   HCT 41.7 04/17/2016   PLT 148 04/17/2016   GLUCOSE 123 (H) 04/17/2016   CHOL 161 04/17/2016   TRIG 199 (H) 04/17/2016   HDL 33 (L) 04/17/2016   LDLCALC 88 04/17/2016   ALT 19 04/17/2016   AST 24 04/17/2016   NA 143 04/17/2016   K 4.4 04/17/2016   CL 106  04/17/2016   CREATININE 1.35 (H) 04/17/2016   BUN 24 04/17/2016   CO2 28 04/17/2016   TSH 3.199 06/28/2015   INR 1.25 03/23/2014   HGBA1C 6.3 (H) 03/19/2014    BNP (last 3 results)  Recent Labs  10/12/15 1527 12/16/15 1213 04/17/16 1142  BNP 53.3 175.1* 70.5    ProBNP (last 3 results) No results for input(s): PROBNP in the last 8760 hours.   Other Studies Reviewed Today:  CXR FINDINGS 03/2016: Normal heart size and mediastinal contours. Status post aortic valve replacement. Low volume chest with streaky basilar opacities. There is generalized bronchitic markings. No edema, effusion, or pneumothorax.  IMPRESSION: Bronchitic markings and mild basilar atelectasis.   Electronically Signed   By: Monte Fantasia M.D.   On: 04/17/2016 14:08   Echo Study Conclusions from 10/2015  - Left ventricle: The cavity size was normal. Systolic function was  normal. The estimated ejection fraction was in the range of 60%  to 65%. Wall motion was normal; there were no regional wall  motion abnormalities. There was an increased relative  contribution of atrial contraction to ventricular filling.  Doppler parameters are consistent with abnormal left ventricular  relaxation (grade 1 diastolic dysfunction). - Aortic valve: S/P bovine bioprosthesis followed by valve in valve  TAVR which appears to be functioning normally. The mean AV  gradient across the valve is elevated at 7mmHg but no change  from prior echo. Mean gradient (S): 35 mm Hg. Valve area (VTI):  0.94 cm^2. Valve area (Vmax): 1.05 cm^2. Valve area (Vmean): 0.84  cm^2. - Mitral valve: Mildly calcified annulus.   ASSESSMENT AND PLAN: 1. Aortic valve disease s/p valve-in-valve TAVR: Pt with NYHA II/II symptoms of exertional dyspnea. Looks better today with some additional diuresis and antibiotics for probable  bronchitis. Recheck BMET today. See back in a month. Will try to continue with close follow up.     2. CAD - stable without symptoms of angina - would favor conservative management.   3. HTN - BP looks better today. Will leave him on his current regimen for now.   4. Situational stress/grief - does not seem as mad - but probably still a fair amount of depression. His wife has now been gone for 5 months.   Current medicines are reviewed with the patient today.  The patient does not have concerns regarding medicines other than what has been noted above.  The following changes have been made:  See above.  Labs/ tests ordered today include:   No orders of the defined types were placed in this encounter.    Disposition:   FU with me in 1 month. Will try to arrange for a day when Dr. Burt Knack is here as well.   Patient is agreeable to this plan and will call if any problems develop in the interim.   Signed: Burtis Junes, RN, ANP-C 04/24/2016 11:48 AM  [facility] [address] [place], [place]  [address] Phone: [phone] Fax: [phone]

## 2016-04-24 NOTE — Patient Instructions (Addendum)
We will be checking the following labs today - BMET   Medication Instructions:    Continue with your current medicines.   We will keep you on the 40 mg of Lasix    Testing/Procedures To Be Arranged:  N/A  Follow-Up:   See me in one month - try to put on a day that Dr. Burt Knack is here.     Other Special Instructions:   N/A    If you need a refill on your cardiac medications before your next appointment, please call your pharmacy.   Call the [place] office at [phone] if you have any questions, problems or concerns.

## 2016-04-25 ENCOUNTER — Other Ambulatory Visit: Payer: Self-pay | Admitting: *Deleted

## 2016-04-25 DIAGNOSIS — R748 Abnormal levels of other serum enzymes: Secondary | ICD-10-CM

## 2016-05-02 ENCOUNTER — Other Ambulatory Visit: Payer: Medicare Other | Admitting: *Deleted

## 2016-05-02 DIAGNOSIS — R748 Abnormal levels of other serum enzymes: Secondary | ICD-10-CM

## 2016-05-02 LAB — BASIC METABOLIC PANEL
BUN: 21 mg/dL (ref 7–25)
CO2: 29 mmol/L (ref 20–31)
Calcium: 9 mg/dL (ref 8.6–10.3)
Chloride: 103 mmol/L (ref 98–110)
Creat: 1.3 mg/dL — ABNORMAL HIGH (ref 0.70–1.11)
Glucose, Bld: 80 mg/dL (ref 65–99)
Potassium: 3.5 mmol/L (ref 3.5–5.3)
Sodium: 142 mmol/L (ref 135–146)

## 2016-05-08 ENCOUNTER — Telehealth: Payer: Self-pay | Admitting: *Deleted

## 2016-05-08 NOTE — Telephone Encounter (Signed)
Walked back to refill room and Candice stated pt called and left vm on refill room line stating wants a refill on lasix.  Waiting for pt to call back.

## 2016-05-09 ENCOUNTER — Other Ambulatory Visit: Payer: Self-pay | Admitting: Nurse Practitioner

## 2016-05-09 MED ORDER — FUROSEMIDE 40 MG PO TABS: 40.0000 mg | ORAL_TABLET | Freq: Every day | ORAL | 3 refills | Status: DC

## 2016-05-10 NOTE — Telephone Encounter (Signed)
Patient's lasix was filled yesterday from [facility].

## 2016-05-23 ENCOUNTER — Ambulatory Visit: Payer: Medicare Other | Admitting: Nurse Practitioner

## 2016-06-11 ENCOUNTER — Ambulatory Visit (INDEPENDENT_AMBULATORY_CARE_PROVIDER_SITE_OTHER): Payer: Medicare Other | Admitting: Nurse Practitioner

## 2016-06-11 ENCOUNTER — Encounter: Payer: Self-pay | Admitting: Nurse Practitioner

## 2016-06-11 VITALS — BP 148/100 | HR 58 | Ht 66.0 in | Wt 190.4 lb

## 2016-06-11 DIAGNOSIS — I10 Essential (primary) hypertension: Secondary | ICD-10-CM

## 2016-06-11 DIAGNOSIS — I259 Chronic ischemic heart disease, unspecified: Secondary | ICD-10-CM

## 2016-06-11 LAB — BASIC METABOLIC PANEL
BUN: 20 mg/dL (ref 7–25)
CO2: 29 mmol/L (ref 20–31)
Calcium: 9 mg/dL (ref 8.6–10.3)
Chloride: 104 mmol/L (ref 98–110)
Creat: 1.19 mg/dL — ABNORMAL HIGH (ref 0.70–1.11)
Glucose, Bld: 85 mg/dL (ref 65–99)
Potassium: 3.7 mmol/L (ref 3.5–5.3)
Sodium: 143 mmol/L (ref 135–146)

## 2016-06-11 MED ORDER — METOPROLOL TARTRATE 25 MG PO TABS: 25.0000 mg | ORAL_TABLET | Freq: Two times a day (BID) | ORAL | 3 refills | Status: DC

## 2016-06-11 NOTE — Progress Notes (Signed)
CARDIOLOGY OFFICE NOTE  Date:  06/11/2016    [name] [name] Date of Birth: 02/09/1923 Medical Record [id number]  PCP:  Anthoney Harada, MD  Cardiologist:  Jerel Shepherd  Chief Complaint  Patient presents with  . Cardiomyopathy  . Cardiac Valve Problem    Follow up visit - seen for Dr. Burt Knack    History of Present Illness: [name] [name] is a 82 y.o. male who presents today for a one month check. Seen for Dr. Burt Knack.   He has CAD with remote inferior MI, past stent to the LCX and has a totally occluded RCA. Had prior AVR by Dr. Cyndia Bent in 2003, past AAA repair in 2004, PVD, past CVA, HLD, BPH and hypothyroidism. He developed severe bioprosthetic valve aortic insufficiency with worsening heart failure and was ultimately treated with valve and valve TAVR using a 23 mm Edwards Sapian XT valve via a transfemoral approach on 03/23/2014.  Last seen in September by Dr. Burt Knack and felt to be doing ok.   I saw him back in December & January - wife had been placed in the nursing home. He was not adjusting at all - not active, angry, etc. Getting too much salt. Eating out. Weight up. BP was up and I adjusted his medicines. Seen back in January- more short of breath - got him back on Lasix. Did get his echo updated in early April. Last seen by me back in 12-25-22 - his wife had died - he was pretty belligerent - had stopped some of his medicines - wanting to die.   I saw him back in late September and early October. He had been to [facility] and complained of shortness of breath. Seemed to be more bronchitis and I gave him a round of antibiotics. On follow up, he was better.   Comes in today. Here alone today. Several issues - having some issue with his eye - may need surgery - undefined issue - laser apparently not an option. Ended up getting sent to Dr. Zadie Rhine from Dr. Kathrin Penner. He has had an injection in one and may need surgery on the other - undefined. Goes back in early December. He  is apparently allergic to Mucinex - tongue swelled up. Trying to get a refund. This seems to be improving. He notes he is doing ok. Does not seem as depressed as he has been. No swelling. Breathing is ok. He does not check his BP at home but has a cuff.   Past Medical History:  Diagnosis Date  . AAA (abdominal aortic aneurysm) ([place])    s/p endovascular repair 2004  . Aortic insufficiency    Prosthetic valve dysfunction  . Aortic stenosis    s/p AVR in 2003, bovine  . Arthritis   . BCC (basal cell carcinoma) 09/17/2012  . BPH (benign prostatic hyperplasia)   . CAD (coronary artery disease)    stents in [place] and has a totally occluded RCA  . Chicken pox   . CVA (cerebral infarction) 12/09  . Depression   . Diverticulosis   . Glaucoma    recently told by Dr Janyth Contes he did not have it  . HTN (hypertension)   . Hyperlipemia   . Leaky heart valve   . Measles   . Mumps   . Myocardial infarction   . Personal history of poliomyelitis 09/17/2012   In childhood  . Pneumonia    hx of  . Polio   . Prosthetic valve dysfunction  Aortic stenosis and insufficiency   . Right iliac artery stenosis (HCC)   . 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 Shortness of breath    exertion, orthopnea  . Sleeping difficulties   . Stroke [facility])    no residual  . Thyroid disease     Past Surgical History:  Procedure Laterality Date  . ABDOMINAL AORTIC ANEURYSM REPAIR  2004   s/p endovascular repiar of AAA in 2004  . AORTIC VALVE REPLACEMENT  2003   #38mm pericardial valve   . APPENDECTOMY    . CARDIAC CATHETERIZATION    . CORONARY ANGIOPLASTY    . CORONARY ARTERY BYPASS GRAFT    . CORONARY STENT PLACEMENT    . INTRAOPERATIVE TRANSESOPHAGEAL ECHOCARDIOGRAM N/A 03/23/2014   Procedure: INTRAOPERATIVE TRANSESOPHAGEAL ECHOCARDIOGRAM;  Surgeon: Sherren Mocha, MD;  Location: [facility] OR;   Service: Open Heart Surgery;  Laterality: N/A;  . JOINT REPLACEMENT    . LEFT AND RIGHT HEART CATHETERIZATION WITH CORONARY ANGIOGRAM N/A 02/08/2014   Procedure: LEFT AND RIGHT HEART CATHETERIZATION WITH CORONARY ANGIOGRAM;  Surgeon: Blane Ohara, MD;  Location: [facility] CATH LAB;  Service: Cardiovascular;  Laterality: N/A;  . TEE WITHOUT CARDIOVERSION N/A 09/02/2013   Procedure: TRANSESOPHAGEAL ECHOCARDIOGRAM (TEE);  Surgeon: Dorothy Spark, MD;  Location: [facility];  Service: Cardiovascular;  Laterality: N/A;  . TONSILLECTOMY    . TRANSCATHETER AORTIC VALVE REPLACEMENT, TRANSFEMORAL N/A 03/23/2014   Procedure: TRANSCATHETER AORTIC VALVE REPLACEMENT, TRANSFEMORAL;  Surgeon: Sherren Mocha, MD;  Location: [facility];  Service: Open Heart Surgery;  Laterality: N/A;  valve in valve TAVR  . WISDOM TOOTH EXTRACTION       Medications: Current Outpatient Prescriptions  Medication Sig Dispense Refill  . aspirin 81 MG tablet Take 81 mg by mouth daily.    Marland Kitchen atorvastatin (LIPITOR) 80 MG tablet Take 1 tablet by mouth  daily 90 tablet 3  . B Complex-C (SUPER B COMPLEX) TABS Take 1 tablet by mouth daily.    . cholecalciferol (VITAMIN D) 1000 units tablet Take 4,000 Units by mouth daily.    . clindamycin (CLEOCIN) 150 MG capsule TK 4 CS PO PRIOR TO DENTAL APPOINTMENT  0  . co-enzyme Q-10 30 MG capsule Take 30 mg by mouth daily.    . finasteride (PROSCAR) 5 MG tablet Take 1 tablet by mouth  daily 90 tablet 3  . furosemide (LASIX) 40 MG tablet Take 1 tablet (40 mg total) by mouth daily. 90 tablet 3  . levothyroxine (SYNTHROID, LEVOTHROID) 50 MCG tablet Take 1 tablet (50 mcg total) by mouth daily. 90 tablet 3  . losartan (COZAAR) 100 MG tablet Take 1 tablet (100 mg total) by mouth daily. 90 tablet 3  . metoprolol tartrate (LOPRESSOR) 25 MG tablet Take 1 tablet (25 mg total) by mouth 2 (two) times daily. 180 tablet 3  . Multiple Vitamins-Minerals (PRESERVISION AREDS 2 PO) Take 1 capsule by mouth 2 (two) times  daily.     Marland Kitchen PARoxetine (PAXIL) 20 MG tablet Take 0.5 tablets (10 mg total) by mouth every morning. 45 tablet 3  . potassium chloride (K-DUR) 10 MEQ tablet Take 1 tablet (10 mEq total) by mouth daily. 30 tablet 3   No current facility-administered medications for this visit.     Allergies: Allergies  Allergen Reactions  . Mucinex [Guaifenesin Er]     Tongue swelling  . Amoxicillin-Pot Clavulanate Itching  . Morphine And  Related Nausea And Vomiting  . Penicillins Hives    Social History: The patient  reports that he quit smoking about 35 years ago. His smoking use included Cigarettes. He has a 15.00 pack-year smoking history. He does not have any smokeless tobacco history on file. He reports that he drinks about 4.2 oz of alcohol per week . He reports that he does not use drugs.   Family History: The patient's family history includes Aneurysm in his father; Cancer in his brother, daughter, and mother; Colon cancer in his mother; Heart attack in his brother; Heart disease in his brother; Prostate cancer in his brother.   Review of Systems: Please see the history of present illness.   Otherwise, the review of systems is positive for none.   All other systems are reviewed and negative.   Physical Exam: VS:  BP (!) 148/100   Pulse (!) 58   Ht 5\' 6"  (1.676 m)   Wt 190 lb 6.4 oz (86.4 kg)   SpO2 94% Comment: at rest  BMI 30.73 kg/m  .  BMI Body mass index is 30.73 kg/m.  Wt Readings from Last 3 Encounters:  06/11/16 190 lb 6.4 oz (86.4 kg)  04/24/16 190 lb 6.4 oz (86.4 kg)  04/17/16 191 lb 12.8 oz (87 kg)   BP by me is 150/80 in the left arm  General: Pleasant. Elderly male who is alert and in no acute distress.  HEENT: Normal.  Neck: Supple, no JVD, carotid bruits, or masses noted.  Cardiac: Regular rate and rhythm. Soft outflow murmur noted. No edema.  Respiratory:  Lungs are clear to auscultation bilaterally with normal work of breathing.  GI: Soft and nontender.  MS: No  deformity or atrophy. Gait and ROM intact.  Skin: Warm and dry. Color is normal.  Neuro:  Strength and sensation are intact and no gross focal deficits noted.  Psych: Alert, appropriate and with normal affect.   LABORATORY DATA:  EKG:  EKG is not ordered today.  Lab Results  Component Value Date   WBC 7.1 04/17/2016   HGB 14.2 04/17/2016   HCT 41.7 04/17/2016   PLT 148 04/17/2016   GLUCOSE 80 05/02/2016   CHOL 161 04/17/2016   TRIG 199 (H) 04/17/2016   HDL 33 (L) 04/17/2016   LDLCALC 88 04/17/2016   ALT 19 04/17/2016   AST 24 04/17/2016   NA 142 05/02/2016   K 3.5 05/02/2016   CL 103 05/02/2016   CREATININE 1.30 (H) 05/02/2016   BUN 21 05/02/2016   CO2 29 05/02/2016   TSH 3.199 06/28/2015   INR 1.25 03/23/2014   HGBA1C 6.3 (H) 03/19/2014    BNP (last 3 results)  Recent Labs  10/12/15 1527 12/16/15 1213 04/17/16 1142  BNP 53.3 175.1* 70.5    ProBNP (last 3 results) No results for input(s): PROBNP in the last 8760 hours.   Other Studies Reviewed Today:  CXR FINDINGS 03/2016: Normal heart size and mediastinal contours. Status post aortic valve replacement. Low volume chest with streaky basilar opacities. There is generalized bronchitic markings. No edema, effusion, or pneumothorax.  IMPRESSION: Bronchitic markings and mild basilar atelectasis.   Electronically Signed By: Monte Fantasia M.D. On: 04/17/2016 14:08   Echo Study Conclusions from 10/2015  - Left ventricle: The cavity size was normal. Systolic function was  normal. The estimated ejection fraction was in the range of 60%  to 65%. Wall motion was normal; there were no regional wall  motion abnormalities. There was an increased  relative  contribution of atrial contraction to ventricular filling.  Doppler parameters are consistent with abnormal left ventricular  relaxation (grade 1 diastolic dysfunction). - Aortic valve: S/P bovine bioprosthesis followed by valve in valve   TAVR which appears to be functioning normally. The mean AV  gradient across the valve is elevated at 13mmHg but no change  from prior echo. Mean gradient (S): 35 mm Hg. Valve area (VTI):  0.94 cm^2. Valve area (Vmax): 1.05 cm^2. Valve area (Vmean): 0.84  cm^2. - Mitral valve: Mildly calcified annulus.    ASSESSMENT AND PLAN: 1. Aortic valve disease s/p valve-in-valve TAVR: Pt with NYHA II/II symptoms of exertional dyspnea. Seems to be holding his own. BMET today. No change with his current regimen.   2. CAD - stable without symptoms of angina - would favor conservative management.   3. HTN - BP not great here today - he says he can check some readings at home. I have opted to leave him on his current regimen for now.   4. Situational stress/grief - does not seem as mad - but probably still a fair amount of depression. His wife has now been gone for 6 months.   Current medicines are reviewed with the patient today.  The patient does not have concerns regarding medicines other than what has been noted above.  The following changes have been made:  See above.  Labs/ tests ordered today include:    Orders Placed This Encounter  Procedures  . Basic metabolic panel     Disposition:   FU with me in 3 months. BMET today.   Patient is agreeable to this plan and will call if any problems develop in the interim.   Signed: Burtis Junes, RN, ANP-C 06/11/2016 12:43 PM  [facility] [address] [place], [place]  [address] Phone: [phone] Fax: [phone]

## 2016-06-11 NOTE — Patient Instructions (Addendum)
We will be checking the following labs today - BMET   Medication Instructions:    Continue with your current medicines.   I have refilled your Metoprolol    Testing/Procedures To Be Arranged:  N/A  Follow-Up:   See me in about 3 months    Other Special Instructions:   Try to check some BPs for me and jot them down for me to look at when you come back    If you need a refill on your cardiac medications before your next appointment, please call your pharmacy.   Call the [place] office at [phone] if you have any questions, problems or concerns.

## 2016-06-20 NOTE — Addendum Note (Signed)
Addended by: Lianne Cure A on: 06/20/2016 02:29 PM   Modules accepted: Orders

## 2016-08-13 ENCOUNTER — Ambulatory Visit: Payer: Medicare Other | Admitting: Nurse Practitioner

## 2016-08-31 ENCOUNTER — Other Ambulatory Visit: Payer: Self-pay | Admitting: Nurse Practitioner

## 2016-09-10 ENCOUNTER — Ambulatory Visit (INDEPENDENT_AMBULATORY_CARE_PROVIDER_SITE_OTHER): Payer: Medicare Other | Admitting: Nurse Practitioner

## 2016-09-10 ENCOUNTER — Encounter: Payer: Self-pay | Admitting: Nurse Practitioner

## 2016-09-10 VITALS — BP 178/120 | HR 60 | Ht 66.0 in | Wt 197.4 lb

## 2016-09-10 DIAGNOSIS — R0989 Other specified symptoms and signs involving the circulatory and respiratory systems: Secondary | ICD-10-CM | POA: Diagnosis not present

## 2016-09-10 DIAGNOSIS — Z952 Presence of prosthetic heart valve: Secondary | ICD-10-CM | POA: Diagnosis not present

## 2016-09-10 DIAGNOSIS — K146 Glossodynia: Secondary | ICD-10-CM

## 2016-09-10 DIAGNOSIS — I10 Essential (primary) hypertension: Secondary | ICD-10-CM | POA: Diagnosis not present

## 2016-09-10 DIAGNOSIS — I38 Endocarditis, valve unspecified: Secondary | ICD-10-CM

## 2016-09-10 DIAGNOSIS — I259 Chronic ischemic heart disease, unspecified: Secondary | ICD-10-CM

## 2016-09-10 NOTE — Progress Notes (Signed)
CARDIOLOGY OFFICE NOTE  Date:  09/10/2016    [name] [name] Date of Birth: 1922/11/06 Medical Record [id number]  PCP:  Anthoney Harada, MD  Cardiologist:  Jerel Shepherd    Chief Complaint  Patient presents with  . Cardiac Valve Problem    3 month check - seen for Dr. Burt Knack    History of Present Illness: [name] [name] is a 83 y.o. male who presents today for a 3 month check. Seen for Dr. Burt Knack.   He has CAD with remote inferior MI, past stent to the LCX and has a totally occluded RCA. Had prior AVR by Dr. Cyndia Bent in 2003, past AAA repair in 2004, PVD, past CVA, HLD, BPH and hypothyroidism. He developed severe bioprosthetic valve aortic insufficiency with worsening heart failure and was ultimately treated with valve and valve TAVR using a 23 mm Edwards Sapian XT valve via a transfemoral approach on 03/23/2014.  I have seen him multiple times over the past few years - wife died. He took this pretty hard. BP has been up and down. He has required more Lasix. Echo updated last April and looked ok. Last seen back in November and he seemed to be ok - not as depressed. Cardiac status seemed ok.   Comes in today. Here alone today. Little more belligerent today. Says he's "down" today - lots of grief and issues with settling Connie's estate. He has had eye surgery and will be having repeat surgery later this month - holding aspirin for 2 days prior to this. Still with lots of mucus - allergic to Mucinex. Heart seems to be doing ok. Says he is taking his medicines - did not bring them which he usually does. No chest pain. Breathing is "bad" - seems worse with bending over.  Weight is up. Says he is taking his medicines - but then tells me he has not had his Lasix "in quite some time". No swelling. Hard to say how much salt he gets. Still talking about "Mucinex being his system". Complains of tongue burning and attributes this to the Mucinex - which he has not had for many months.  Tells me he is ready to die.   Past Medical History:  Diagnosis Date  . AAA (abdominal aortic aneurysm) ([place])    s/p endovascular repair 2004  . Aortic insufficiency    Prosthetic valve dysfunction  . Aortic stenosis    s/p AVR in 2003, bovine  . Arthritis   . BCC (basal cell carcinoma) 09/17/2012  . BPH (benign prostatic hyperplasia)   . CAD (coronary artery disease)    stents in [place] and has a totally occluded RCA  . Chicken pox   . CVA (cerebral infarction) 12/09  . Depression   . Diverticulosis   . Glaucoma    recently told by Dr Janyth Contes he did not have it  . HTN (hypertension)   . Hyperlipemia   . Leaky heart valve   . Measles   . Mumps   . Myocardial infarction   . Personal history of poliomyelitis 09/17/2012   In childhood  . Pneumonia    hx of  . Polio   . Prosthetic valve dysfunction    Aortic stenosis and insufficiency   . Right iliac artery stenosis (HCC)   . 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 Shortness of breath    exertion, orthopnea  .  Sleeping difficulties   . Stroke [facility])    no residual  . Thyroid disease     Past Surgical History:  Procedure Laterality Date  . ABDOMINAL AORTIC ANEURYSM REPAIR  2004   s/p endovascular repiar of AAA in 2004  . AORTIC VALVE REPLACEMENT  2003   #68mm pericardial valve   . APPENDECTOMY    . CARDIAC CATHETERIZATION    . CORONARY ANGIOPLASTY    . CORONARY ARTERY BYPASS GRAFT    . CORONARY STENT PLACEMENT    . INTRAOPERATIVE TRANSESOPHAGEAL ECHOCARDIOGRAM N/A 03/23/2014   Procedure: INTRAOPERATIVE TRANSESOPHAGEAL ECHOCARDIOGRAM;  Surgeon: Sherren Mocha, MD;  Location: [facility] OR;  Service: Open Heart Surgery;  Laterality: N/A;  . JOINT REPLACEMENT    . LEFT AND RIGHT HEART CATHETERIZATION WITH CORONARY ANGIOGRAM N/A 02/08/2014   Procedure: LEFT AND RIGHT HEART CATHETERIZATION WITH CORONARY ANGIOGRAM;   Surgeon: Blane Ohara, MD;  Location: [facility] CATH LAB;  Service: Cardiovascular;  Laterality: N/A;  . TEE WITHOUT CARDIOVERSION N/A 09/02/2013   Procedure: TRANSESOPHAGEAL ECHOCARDIOGRAM (TEE);  Surgeon: Dorothy Spark, MD;  Location: [facility];  Service: Cardiovascular;  Laterality: N/A;  . TONSILLECTOMY    . TRANSCATHETER AORTIC VALVE REPLACEMENT, TRANSFEMORAL N/A 03/23/2014   Procedure: TRANSCATHETER AORTIC VALVE REPLACEMENT, TRANSFEMORAL;  Surgeon: Sherren Mocha, MD;  Location: [facility];  Service: Open Heart Surgery;  Laterality: N/A;  valve in valve TAVR  . WISDOM TOOTH EXTRACTION       Medications: Current Outpatient Prescriptions  Medication Sig Dispense Refill  . aspirin 81 MG tablet Take 81 mg by mouth daily.    Marland Kitchen atorvastatin (LIPITOR) 80 MG tablet TAKE 1 TABLET BY MOUTH  DAILY 90 tablet 3  . B Complex-C (SUPER B COMPLEX) TABS Take 1 tablet by mouth daily.    . cholecalciferol (VITAMIN D) 1000 units tablet Take 4,000 Units by mouth daily.    . clindamycin (CLEOCIN) 150 MG capsule TK 4 CS PO PRIOR TO DENTAL APPOINTMENT  0  . co-enzyme Q-10 30 MG capsule Take 30 mg by mouth daily.    . finasteride (PROSCAR) 5 MG tablet Take 1 tablet by mouth  daily 90 tablet 3  . furosemide (LASIX) 40 MG tablet Take 1 tablet (40 mg total) by mouth daily. 90 tablet 3  . levothyroxine (SYNTHROID, LEVOTHROID) 50 MCG tablet Take 1 tablet (50 mcg total) by mouth daily. 90 tablet 3  . losartan (COZAAR) 100 MG tablet TAKE 1 TABLET BY MOUTH  DAILY 90 tablet 3  . metoprolol tartrate (LOPRESSOR) 25 MG tablet Take 1 tablet (25 mg total) by mouth 2 (two) times daily. 180 tablet 3  . Multiple Vitamins-Minerals (PRESERVISION AREDS 2 PO) Take 1 capsule by mouth 2 (two) times daily.     Marland Kitchen PARoxetine (PAXIL) 20 MG tablet Take 0.5 tablets (10 mg total) by mouth every morning. 45 tablet 3  . potassium chloride (K-DUR) 10 MEQ tablet Take 1 tablet (10 mEq total) by mouth daily. 30 tablet 3  . prednisoLONE acetate  (PRED FORTE) 1 % ophthalmic suspension Place 1 drop into the left eye every 4 (four) hours.      No current facility-administered medications for this visit.     Allergies: Allergies  Allergen Reactions  . Mucinex [Guaifenesin Er]     Tongue swelling  . Amoxicillin-Pot Clavulanate Itching  . Morphine And Related Nausea And Vomiting  . Penicillins Hives    Social History: The patient  reports that he quit smoking about 35 years ago. His smoking  use included Cigarettes. He has a 15.00 pack-year smoking history. He does not have any smokeless tobacco history on file. He reports that he drinks about 4.2 oz of alcohol per week . He reports that he does not use drugs.   Family History: The patient's family history includes Aneurysm in his father; Cancer in his brother, daughter, and mother; Colon cancer in his mother; Heart attack in his brother; Heart disease in his brother; Prostate cancer in his brother.   Review of Systems: Please see the history of present illness.   Otherwise, the review of systems is positive for none.   All other systems are reviewed and negative.   Physical Exam: VS:  BP (!) 178/120   Pulse 60   Ht 5\' 6"  (1.676 m)   Wt 197 lb 6.4 oz (89.5 kg)   BMI 31.86 kg/m  .  BMI Body mass index is 31.86 kg/m.  Wt Readings from Last 3 Encounters:  09/10/16 197 lb 6.4 oz (89.5 kg)  06/11/16 190 lb 6.4 oz (86.4 kg)  04/24/16 190 lb 6.4 oz (86.4 kg)   BP is 150/90 by me.   General: Pretty loud today. Little belligerent. He is alert and in no acute distress.   HEENT: Normal.  Neck: Supple, no JVD, carotid bruits, or masses noted.  Cardiac: Regular rate and rhythm. Outflow murmur noted. No edema.  Respiratory:  Lungs are clear to auscultation bilaterally with normal work of breathing.  GI: Soft and nontender.  MS: No deformity or atrophy. Gait and ROM intact.  Skin: Warm and dry. Color is normal.  Neuro:  Strength and sensation are intact and no gross focal deficits  noted.  Psych: Alert, appropriate and with normal affect.   LABORATORY DATA:  EKG:  EKG is not ordered today.  Lab Results  Component Value Date   WBC 7.1 04/17/2016   HGB 14.2 04/17/2016   HCT 41.7 04/17/2016   PLT 148 04/17/2016   GLUCOSE 85 06/11/2016   CHOL 161 04/17/2016   TRIG 199 (H) 04/17/2016   HDL 33 (L) 04/17/2016   LDLCALC 88 04/17/2016   ALT 19 04/17/2016   AST 24 04/17/2016   NA 143 06/11/2016   K 3.7 06/11/2016   CL 104 06/11/2016   CREATININE 1.19 (H) 06/11/2016   BUN 20 06/11/2016   CO2 29 06/11/2016   TSH 3.199 06/28/2015   INR 1.25 03/23/2014   HGBA1C 6.3 (H) 03/19/2014    BNP (last 3 results)  Recent Labs  10/12/15 1527 12/16/15 1213 04/17/16 1142  BNP 53.3 175.1* 70.5    ProBNP (last 3 results) No results for input(s): PROBNP in the last 8760 hours.   Other Studies Reviewed Today:  CXR FINDINGS 03/2016: Normal heart size and mediastinal contours. Status post aortic valve replacement. Low volume chest with streaky basilar opacities. There is generalized bronchitic markings. No edema, effusion, or pneumothorax.  IMPRESSION: Bronchitic markings and mild basilar atelectasis.   Electronically Signed By: Monte Fantasia M.D. On: 04/17/2016 14:08   Echo Study Conclusions from 10/2015  - Left ventricle: The cavity size was normal. Systolic function was  normal. The estimated ejection fraction was in the range of 60%  to 65%. Wall motion was normal; there were no regional wall  motion abnormalities. There was an increased relative  contribution of atrial contraction to ventricular filling.  Doppler parameters are consistent with abnormal left ventricular  relaxation (grade 1 diastolic dysfunction). - Aortic valve: S/P bovine bioprosthesis followed by valve in valve  TAVR which appears to be functioning normally. The mean AV  gradient across the valve is elevated at 23mmHg but no change  from prior echo. Mean  gradient (S): 35 mm Hg. Valve area (VTI):  0.94 cm^2. Valve area (Vmax): 1.05 cm^2. Valve area (Vmean): 0.84  cm^2. - Mitral valve: Mildly calcified annulus.    ASSESSMENT AND PLAN:  1. Aortic valve disease s/p valve-in-valve TAVR: Pt with NYHA II/II symptoms of exertional dyspnea. Seems to be holding his own - I think most of his issues and shortness of breath are from deconditioning/depression. Lab today. No change with his current regimen. Repeat echo in April.   2. CAD - stable without symptoms of angina - would favor conservative management.   3. HTN - BP little better by me - hard to say if he really is taking his medicines - I worry about compliance.  I have opted to leave him on his current regimen for now.   4. Situational stress/grief - ongoing issue  5. Tongue burning - will check B12 level. He really does not see primary care.   Current medicines are reviewed with the patient today.  The patient does not have concerns regarding medicines other than what has been noted above.  The following changes have been made:  See above.  Labs/ tests ordered today include:    Orders Placed This Encounter  Procedures  . Basic metabolic panel  . CBC  . Vitamin B12  . TSH  . ECHOCARDIOGRAM COMPLETE     Disposition:   FU with me in 3 months. Echo in April. Lab today.    Patient is agreeable to this plan and will call if any problems develop in the interim.   SignedTruitt Merle, NP  09/10/2016 11:51 AM  [facility] [address] [place], [place]  [address] Phone: [phone] Fax: [phone]

## 2016-09-10 NOTE — Addendum Note (Signed)
Addended by: Eulis Foster on: 09/10/2016 12:09 PM   Modules accepted: Orders

## 2016-09-10 NOTE — Addendum Note (Signed)
Addended by: Eulis Foster on: 09/10/2016 12:14 PM   Modules accepted: Orders

## 2016-09-10 NOTE — Patient Instructions (Addendum)
We will be checking the following labs today - BMET, CBC, B12, TSH   Medication Instructions:    Continue with your current medicines.     Testing/Procedures To Be Arranged:  Echo in April  Follow-Up:   See me in 3 months    Other Special Instructions:   N/A    If you need a refill on your cardiac medications before your next appointment, please call your pharmacy.   Call the [place] office at [phone] if you have any questions, problems or concerns.

## 2016-09-10 NOTE — Addendum Note (Signed)
Addended by: Eulis Foster on: 09/10/2016 12:15 PM   Modules accepted: Orders

## 2016-09-11 LAB — BASIC METABOLIC PANEL
BUN/Creatinine Ratio: 13 (ref 10–24)
BUN: 18 mg/dL (ref 10–36)
CO2: 25 mmol/L (ref 18–29)
Calcium: 8.8 mg/dL (ref 8.6–10.2)
Chloride: 100 mmol/L (ref 96–106)
Creatinine, Ser: 1.34 mg/dL — ABNORMAL HIGH (ref 0.76–1.27)
GFR calc Af Amer: 53 mL/min/{1.73_m2} — ABNORMAL LOW (ref >59)
GFR calc non Af Amer: 46 mL/min/{1.73_m2} — ABNORMAL LOW (ref >59)
Glucose: 88 mg/dL (ref 65–99)
Potassium: 4.8 mmol/L (ref 3.5–5.2)
Sodium: 141 mmol/L (ref 134–144)

## 2016-09-11 LAB — CBC
Hematocrit: 42.4 % (ref 37.5–51.0)
Hemoglobin: 14.4 g/dL (ref 13.0–17.7)
MCH: 32.8 pg (ref 26.6–33.0)
MCHC: 34 g/dL (ref 31.5–35.7)
MCV: 97 fL (ref 79–97)
Platelets: 138 10*3/uL — ABNORMAL LOW (ref 150–379)
RBC: 4.39 x10E6/uL (ref 4.14–5.80)
RDW: 13.3 % (ref 12.3–15.4)
WBC: 8.3 10*3/uL (ref 3.4–10.8)

## 2016-09-11 LAB — VITAMIN B12: Vitamin B-12: 721 pg/mL (ref 232–1245)

## 2016-09-11 LAB — TSH: TSH: 4.09 u[IU]/mL (ref 0.450–4.500)

## 2016-09-19 ENCOUNTER — Telehealth: Payer: Self-pay

## 2016-09-19 NOTE — Telephone Encounter (Signed)
Received a medical clarification form from [facility] for Lipitor 80 mg.Form completed with Dr.Jordan's signature,address and NPI #.Faxed back to fax # [phone].

## 2016-11-02 ENCOUNTER — Other Ambulatory Visit: Payer: Self-pay | Admitting: Nurse Practitioner

## 2016-11-07 ENCOUNTER — Other Ambulatory Visit: Payer: Self-pay

## 2016-11-07 ENCOUNTER — Ambulatory Visit (HOSPITAL_COMMUNITY): Payer: Medicare Other | Attending: Cardiovascular Disease

## 2016-11-07 DIAGNOSIS — I350 Nonrheumatic aortic (valve) stenosis: Secondary | ICD-10-CM | POA: Diagnosis not present

## 2016-11-07 DIAGNOSIS — I259 Chronic ischemic heart disease, unspecified: Secondary | ICD-10-CM

## 2016-11-07 DIAGNOSIS — I38 Endocarditis, valve unspecified: Secondary | ICD-10-CM

## 2016-11-07 DIAGNOSIS — I10 Essential (primary) hypertension: Secondary | ICD-10-CM

## 2016-11-07 DIAGNOSIS — I340 Nonrheumatic mitral (valve) insufficiency: Secondary | ICD-10-CM | POA: Diagnosis not present

## 2016-11-07 DIAGNOSIS — R0989 Other specified symptoms and signs involving the circulatory and respiratory systems: Secondary | ICD-10-CM

## 2016-11-07 DIAGNOSIS — Z952 Presence of prosthetic heart valve: Secondary | ICD-10-CM | POA: Insufficient documentation

## 2016-11-07 DIAGNOSIS — I361 Nonrheumatic tricuspid (valve) insufficiency: Secondary | ICD-10-CM | POA: Insufficient documentation

## 2016-11-07 DIAGNOSIS — K146 Glossodynia: Secondary | ICD-10-CM | POA: Diagnosis not present

## 2016-11-07 DIAGNOSIS — I5030 Unspecified diastolic (congestive) heart failure: Secondary | ICD-10-CM | POA: Diagnosis not present

## 2016-11-08 ENCOUNTER — Encounter: Payer: Self-pay | Admitting: Nurse Practitioner

## 2016-11-27 NOTE — Progress Notes (Signed)
CARDIOLOGY OFFICE NOTE  Date:  11/28/2016    [name] [name] Date of Birth: 14-Mar-1923 Medical Record #[id number]  PCP:  Vernie Shanks, MD  Cardiologist:  Jerel Shepherd  Chief Complaint  Patient presents with  . Cardiac Valve Problem    Follow up visit - seen for Dr. Burt Knack    History of Present Illness: [name] [name] is a 82 y.o. male who presents today for a follow up visit. Seen for Dr. Burt Knack.   He has CAD with remote inferior MI, past stent to the LCX and has a totally occluded RCA. Had prior AVR by Dr. Cyndia Bent in 2003, past AAA repair in 2004, PVD, past CVA, HLD, BPH and hypothyroidism. He developed severe bioprosthetic valve aortic insufficiency with worsening heart failure and was ultimately treated with valve and valve TAVR using a 23 mm Edwards Sapian XT valve via a transfemoral approach on 03/23/2014.  I have seen him multiple times over the past few years - wife died. He took this pretty hard. BP has been up and down. He has required more Lasix. Echo updated last April and looked ok. Seen back in November and he seemed to be ok - not as depressed. At last visit back in February he was pretty down. Weight was up and was short of breath. He had his echo updated - noted more AS - reviewed with Dr. Burt Knack - no further intervention warranted - he will be managed medically.  He has told me multiple times that he was ready to die.   Comes in today. Here alone with both kids today. He has had a recent fall - was trying to kill a bug and feel. Hit is head, knee - had lots of trouble getting up. Saw ortho - using Tylenol and Biofreeze. Has had some chronic hip pain - probably coming out of his back. No life line at home. Some shortness of breath. Not taking his Lasix. No chest pain. Echo reviewed with all three.   Past Medical History:  Diagnosis Date  . AAA (abdominal aortic aneurysm) ([place])    s/p endovascular repair 2004  . Aortic insufficiency    Prosthetic valve  dysfunction  . Aortic stenosis    s/p AVR in 2003, bovine  . Arthritis   . BCC (basal cell carcinoma) 09/17/2012  . BPH (benign prostatic hyperplasia)   . CAD (coronary artery disease)    stents in [place] and has a totally occluded RCA  . Chicken pox   . CVA (cerebral infarction) 12/09  . Depression   . Diverticulosis   . Glaucoma    recently told by Dr Janyth Contes he did not have it  . HTN (hypertension)   . Hyperlipemia   . Leaky heart valve   . Measles   . Mumps   . Myocardial infarction ([place])   . Personal history of poliomyelitis 09/17/2012   In childhood  . Pneumonia    hx of  . Polio   . Prosthetic valve dysfunction    Aortic stenosis and insufficiency   . Right iliac artery stenosis (HCC)   . 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 Shortness of breath    exertion, orthopnea  . Sleeping difficulties   . Stroke [facility])    no residual  . Thyroid disease     Past Surgical History:  Procedure Laterality Date  .  ABDOMINAL AORTIC ANEURYSM REPAIR  2004   s/p endovascular repiar of AAA in 2004  . AORTIC VALVE REPLACEMENT  2003   #28mm pericardial valve   . APPENDECTOMY    . CARDIAC CATHETERIZATION    . CORONARY ANGIOPLASTY    . CORONARY ARTERY BYPASS GRAFT    . CORONARY STENT PLACEMENT    . INTRAOPERATIVE TRANSESOPHAGEAL ECHOCARDIOGRAM N/A 03/23/2014   Procedure: INTRAOPERATIVE TRANSESOPHAGEAL ECHOCARDIOGRAM;  Surgeon: Sherren Mocha, MD;  Location: [facility] OR;  Service: Open Heart Surgery;  Laterality: N/A;  . JOINT REPLACEMENT    . LEFT AND RIGHT HEART CATHETERIZATION WITH CORONARY ANGIOGRAM N/A 02/08/2014   Procedure: LEFT AND RIGHT HEART CATHETERIZATION WITH CORONARY ANGIOGRAM;  Surgeon: Blane Ohara, MD;  Location[facility] LAB;  Service: Cardiovascular;  Laterality: N/A;  . TEE WITHOUT CARDIOVERSION N/A 09/02/2013   Procedure: TRANSESOPHAGEAL ECHOCARDIOGRAM  (TEE);  Surgeon: Dorothy Spark, MD;  Location: [place];  Service: Cardiovascular;  Laterality: N/A;  . TONSILLECTOMY    . TRANSCATHETER AORTIC VALVE REPLACEMENT, TRANSFEMORAL N/A 03/23/2014   Procedure: TRANSCATHETER AORTIC VALVE REPLACEMENT, TRANSFEMORAL;  Surgeon: Sherren Mocha, MD;  Location: [place];  Service: Open Heart Surgery;  Laterality: N/A;  valve in valve TAVR  . WISDOM TOOTH EXTRACTION       Medications: Current Outpatient Prescriptions  Medication Sig Dispense Refill  . acetaminophen (TYLENOL) 500 MG tablet Take 1,000 mg by mouth every 4 (four) hours as needed.    Marland Kitchen aspirin 81 MG tablet Take 81 mg by mouth daily.    Marland Kitchen atorvastatin (LIPITOR) 80 MG tablet TAKE 1 TABLET BY MOUTH  DAILY 90 tablet 3  . B Complex-C (SUPER B COMPLEX) TABS Take 1 tablet by mouth daily.    . cholecalciferol (VITAMIN D) 1000 units tablet Take 4,000 Units by mouth daily.    . clindamycin (CLEOCIN) 150 MG capsule TK 4 CS PO PRIOR TO DENTAL APPOINTMENT  0  . co-enzyme Q-10 30 MG capsule Take 30 mg by mouth daily.    . finasteride (PROSCAR) 5 MG tablet TAKE 1 TABLET BY MOUTH  DAILY 90 tablet 2  . furosemide (LASIX) 40 MG tablet Take 0.5 tablets (20 mg total) by mouth daily. 90 tablet 3  . levothyroxine (SYNTHROID, LEVOTHROID) 50 MCG tablet Take 1 tablet (50 mcg total) by mouth daily. 90 tablet 3  . losartan (COZAAR) 100 MG tablet TAKE 1 TABLET BY MOUTH  DAILY 90 tablet 3  . metoprolol tartrate (LOPRESSOR) 25 MG tablet Take 1 tablet (25 mg total) by mouth 2 (two) times daily. 180 tablet 3  . Multiple Vitamins-Minerals (PRESERVISION AREDS 2 PO) Take 1 capsule by mouth 2 (two) times daily.     Marland Kitchen PARoxetine (PAXIL) 20 MG tablet Take 0.5 tablets (10 mg total) by mouth every morning. 45 tablet 3  . potassium chloride (K-DUR) 10 MEQ tablet Take 1 tablet (10 mEq total) by mouth daily. 30 tablet 3  . prednisoLONE acetate (PRED FORTE) 1 % ophthalmic suspension Place 1 drop into the left eye every 4 (four)  hours.      No current facility-administered medications for this visit.     Allergies: Allergies  Allergen Reactions  . Mucinex [Guaifenesin Er]     Tongue swelling  . Amoxicillin-Pot Clavulanate Itching  . Morphine And Related Nausea And Vomiting  . Penicillins Hives    Social History: The patient  reports that he quit smoking about 36 years ago. His smoking use included Cigarettes. He has a 15.00 pack-year smoking  history. He has never used smokeless tobacco. He reports that he drinks about 4.2 oz of alcohol per week . He reports that he does not use drugs.   Family History: The patient's family history includes Aneurysm in his father; Cancer in his brother, daughter, and mother; Colon cancer in his mother; Heart attack in his brother; Heart disease in his brother; Prostate cancer in his brother.   Review of Systems: Please see the history of present illness.   Otherwise, the review of systems is positive for none.   All other systems are reviewed and negative.   Physical Exam: VS:  BP 130/80 (BP Location: Left Arm, Patient Position: Sitting, Cuff Size: Normal)   Pulse 68   Ht 5\' 6"  (1.676 m)   Wt 195 lb 12.8 oz (88.8 kg)   BMI 31.60 kg/m  .  BMI Body mass index is 31.6 kg/m.  Wt Readings from Last 3 Encounters:  11/28/16 195 lb 12.8 oz (88.8 kg)  09/10/16 197 lb 6.4 oz (89.5 kg)  06/11/16 190 lb 6.4 oz (86.4 kg)    General: Pleasant. Elderly male who is alert and in no acute distress.   HEENT: Normal.  Neck: Supple, no JVD, carotid bruits, or masses noted.  Cardiac: Regular rate and rhythm. Harsh outflow murmur. No edema.  Respiratory:  Lungs are clear to auscultation bilaterally with normal work of breathing.  GI: Soft and nontender.  MS: No deformity or atrophy. Gait and ROM intact. No cane today. Skin: Warm and dry. Color is normal.  Neuro:  Strength and sensation are intact and no gross focal deficits noted.  Psych: Alert, appropriate and with normal  affect.   LABORATORY DATA:  EKG:  EKG is not ordered today.  Lab Results  Component Value Date   WBC 8.3 09/10/2016   HGB 14.2 04/17/2016   HCT 42.4 09/10/2016   PLT 138 (L) 09/10/2016   GLUCOSE 88 09/10/2016   CHOL 161 04/17/2016   TRIG 199 (H) 04/17/2016   HDL 33 (L) 04/17/2016   LDLCALC 88 04/17/2016   ALT 19 04/17/2016   AST 24 04/17/2016   NA 141 09/10/2016   K 4.8 09/10/2016   CL 100 09/10/2016   CREATININE 1.34 (H) 09/10/2016   BUN 18 09/10/2016   CO2 25 09/10/2016   TSH 4.090 09/10/2016   INR 1.25 03/23/2014   HGBA1C 6.3 (H) 03/19/2014    BNP (last 3 results)  Recent Labs  12/16/15 1213 04/17/16 1142  BNP 175.1* 70.5    ProBNP (last 3 results) No results for input(s): PROBNP in the last 8760 hours.   Other Studies Reviewed Today:  ECHO Study Conclusions 10/2016  - Left ventricle: The cavity size was normal. Wall thickness was   increased in a pattern of moderate LVH. Systolic function was   normal. The estimated ejection fraction was in the range of 50%   to 55%. Wall motion was normal; there were no regional wall   motion abnormalities. Doppler parameters are consistent with   abnormal left ventricular relaxation (grade 1 diastolic   dysfunction). Doppler parameters are consistent with high   ventricular filling pressure. - Aortic valve: A transcatheter aortic valve bioprosthesis was   present. There was severe stenosis. Peak velocity (S): 413 cm/s.   Mean gradient (S): 41 mm Hg. Valve area (VTI): 0.59 cm^2. Valve   area (Vmax): 0.53 cm^2. Valve area (Vmean): 0.57 cm^2. - Mitral valve: Transvalvular velocity was within the normal range.   There was no  evidence for stenosis. There was mild regurgitation. - Right ventricle: The cavity size was normal. Wall thickness was   normal. Systolic function was normal. - Atrial septum: No defect or patent foramen ovale was identified   by color flow Doppler. - Tricuspid valve: There was trivial  regurgitation. - Pulmonary arteries: Systolic pressure was within the normal   range. PA peak pressure: 15 mm Hg (S).  Impressions:  - There is a TAVR bioprosthesis inside a bioprosthetic aortic   valve. Since the echo 10/27/15, the mean gradient of the TAVR has   increased from 35 mmHg to 41 mmHg, conistent with severe   stenosis.     ASSESSMENT AND PLAN:  1. Aortic valve disease s/p valve-in-valve TAVR: Pt with NYHA II/II symptoms of exertional dyspnea. Recent echo with worsening AS - no further interventions are warranted - he will be managed medically - restart Lasix but at half dose - he has tended to not take due to urinary frequency. Lab today.   2. CAD - stable without symptoms of angina - would favor conservative management.   3. HTN - BP is ok today on his current regimen.    4. Situational stress/grief - ongoing issue - not really discussed today  5. Fall - high risk for another - needs Life line at home - he is agreeable - kids will arrange.   Current medicines are reviewed with the patient today.  The patient does not have concerns regarding medicines other than what has been noted above.  The following changes have been made:  See above.  Labs/ tests ordered today include:    Orders Placed This Encounter  Procedures  . Basic metabolic panel     Disposition:   FU with me in 3 months.   Patient is agreeable to this plan and will call if any problems develop in the interim.   SignedTruitt Merle, NP  11/28/2016 12:34 PM  [facility] [address] [place], [place]  [address] Phone: [phone] Fax: [phone]

## 2016-11-28 ENCOUNTER — Ambulatory Visit (INDEPENDENT_AMBULATORY_CARE_PROVIDER_SITE_OTHER): Payer: Medicare Other | Admitting: Nurse Practitioner

## 2016-11-28 ENCOUNTER — Encounter: Payer: Self-pay | Admitting: Nurse Practitioner

## 2016-11-28 VITALS — BP 130/80 | HR 68 | Ht 66.0 in | Wt 195.8 lb

## 2016-11-28 DIAGNOSIS — I10 Essential (primary) hypertension: Secondary | ICD-10-CM

## 2016-11-28 DIAGNOSIS — I350 Nonrheumatic aortic (valve) stenosis: Secondary | ICD-10-CM | POA: Diagnosis not present

## 2016-11-28 DIAGNOSIS — I38 Endocarditis, valve unspecified: Secondary | ICD-10-CM | POA: Diagnosis not present

## 2016-11-28 DIAGNOSIS — I259 Chronic ischemic heart disease, unspecified: Secondary | ICD-10-CM | POA: Diagnosis not present

## 2016-11-28 DIAGNOSIS — E7800 Pure hypercholesterolemia, unspecified: Secondary | ICD-10-CM | POA: Diagnosis not present

## 2016-11-28 DIAGNOSIS — Z952 Presence of prosthetic heart valve: Secondary | ICD-10-CM | POA: Diagnosis not present

## 2016-11-28 MED ORDER — FUROSEMIDE 40 MG PO TABS: 20.0000 mg | ORAL_TABLET | Freq: Every day | ORAL | 3 refills | Status: DC

## 2016-11-28 NOTE — Patient Instructions (Addendum)
We will be checking the following labs today - BMET   Medication Instructions:    Continue with your current medicines. BUT  I want you to take 1/2 dose of your Lasix     Testing/Procedures To Be Arranged:  N/A  Follow-Up:   See me in 3 months    Other Special Instructions:   Calpella is ok  Try Tylenol arthritis  Try Thermacare patches    If you need a refill on your cardiac medications before your next appointment, please call your pharmacy.   Call the [place] office at [phone] if you have any questions, problems or concerns.

## 2016-11-29 LAB — BASIC METABOLIC PANEL
BUN/Creatinine Ratio: 19 (ref 10–24)
BUN: 22 mg/dL (ref 10–36)
CO2: 26 mmol/L (ref 18–29)
Calcium: 9.4 mg/dL (ref 8.6–10.2)
Chloride: 102 mmol/L (ref 96–106)
Creatinine, Ser: 1.14 mg/dL (ref 0.76–1.27)
GFR calc Af Amer: 65 mL/min/{1.73_m2} (ref >59)
GFR calc non Af Amer: 56 mL/min/{1.73_m2} — ABNORMAL LOW (ref >59)
Glucose: 96 mg/dL (ref 65–99)
Potassium: 4.8 mmol/L (ref 3.5–5.2)
Sodium: 142 mmol/L (ref 134–144)

## 2017-03-06 ENCOUNTER — Encounter: Payer: Self-pay | Admitting: Nurse Practitioner

## 2017-03-06 ENCOUNTER — Ambulatory Visit (INDEPENDENT_AMBULATORY_CARE_PROVIDER_SITE_OTHER): Payer: Medicare Other | Admitting: Nurse Practitioner

## 2017-03-06 VITALS — BP 140/82 | HR 57 | Ht 66.0 in | Wt 194.2 lb

## 2017-03-06 DIAGNOSIS — I5032 Chronic diastolic (congestive) heart failure: Secondary | ICD-10-CM | POA: Diagnosis not present

## 2017-03-06 DIAGNOSIS — R0600 Dyspnea, unspecified: Secondary | ICD-10-CM

## 2017-03-06 DIAGNOSIS — Z952 Presence of prosthetic heart valve: Secondary | ICD-10-CM | POA: Diagnosis not present

## 2017-03-06 DIAGNOSIS — I10 Essential (primary) hypertension: Secondary | ICD-10-CM

## 2017-03-06 DIAGNOSIS — I259 Chronic ischemic heart disease, unspecified: Secondary | ICD-10-CM | POA: Diagnosis not present

## 2017-03-06 DIAGNOSIS — I38 Endocarditis, valve unspecified: Secondary | ICD-10-CM | POA: Diagnosis not present

## 2017-03-06 NOTE — Progress Notes (Signed)
CARDIOLOGY OFFICE NOTE  Date:  03/06/2017    [name] Date of Birth: 1923/05/26 Medical Record #[id number]  PCP:  Vernie Shanks, MD  Cardiologist:  Jerel Shepherd    Chief Complaint  Patient presents with  . Aortic Stenosis  . Coronary Artery Disease  . Congestive Heart Failure    Follow up visit - seen for Dr. Burt Knack    History of Present Illness: [name] [name] is a 83 y.o. male who presents today for a follow up visit. Seen for Dr. Burt Knack. Former patient of Dr. Susa Simmonds.  He has CAD with remote inferior MI, past stent to the LCX and has a totally occluded RCA. Had prior AVR by Dr. Cyndia Bent in 2003, past AAA repair in 2004, PVD, past CVA, HLD, BPH and hypothyroidism. He developed severe bioprosthetic valve aortic insufficiency with worsening heart failure and was ultimately treated with valve and valve TAVR using a 23 mm Edwards Sapian XT valve via a transfemoral approach on 03/23/2014.  I have seen him multiple times over the past few years - wife died. He took this pretty hard. BP has been up and down. He has required more Lasix. Echo updated last April and looked ok. Seen back in November and he seemed to be ok - not as depressed. At last visit back in February he was pretty down. Weight was up and was short of breath. He had his echo updated - noted more AS - reviewed with Dr. Burt Knack - no further intervention warranted - he will be managed medically.  He has told me multiple times that he was ready to die.   I last saw him back in May - both of his kids were with him - he had had a fall. Some shortness of breath. Lasix restarted.   Comes in today. Here with his daughter today. He is quite belligerent today - pretty stubborn. He says he is "fine". She agrees. He has refused to get a Lifeline - she and [name] are pretty much letting him do what he wants.  Slowing down due to the weather. He is not taking his Lasix daily - but more taking prn. Sounds like he is not  taking his potassium. Wants me to refill Paxil. Having issus with drainage/allergies and needs ears washed. Refuses initially to go see PCP. His breathing seems stable. No chest pain. Asking me again about his valve and what we are going to do about it.   Past Medical History:  Diagnosis Date  . AAA (abdominal aortic aneurysm) ([place])    s/p endovascular repair 2004  . Aortic insufficiency    Prosthetic valve dysfunction  . Aortic stenosis    s/p AVR in 2003, bovine  . Arthritis   . BCC (basal cell carcinoma) 09/17/2012  . BPH (benign prostatic hyperplasia)   . CAD (coronary artery disease)    stents in [place] and has a totally occluded RCA  . Chicken pox   . CVA (cerebral infarction) 12/09  . Depression   . Diverticulosis   . Glaucoma    recently told by Dr Janyth Contes he did not have it  . HTN (hypertension)   . Hyperlipemia   . Leaky heart valve   . Measles   . Mumps   . Myocardial infarction ([place])   . Personal history of poliomyelitis 09/17/2012   In childhood  . Pneumonia    hx of  . Polio   . Prosthetic valve dysfunction  Aortic stenosis and insufficiency   . Right iliac artery stenosis (HCC)   . 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 Shortness of breath    exertion, orthopnea  . Sleeping difficulties   . Stroke [facility])    no residual  . Thyroid disease     Past Surgical History:  Procedure Laterality Date  . ABDOMINAL AORTIC ANEURYSM REPAIR  2004   s/p endovascular repiar of AAA in 2004  . AORTIC VALVE REPLACEMENT  2003   #30mm pericardial valve   . APPENDECTOMY    . CARDIAC CATHETERIZATION    . CORONARY ANGIOPLASTY    . CORONARY ARTERY BYPASS GRAFT    . CORONARY STENT PLACEMENT    . INTRAOPERATIVE TRANSESOPHAGEAL ECHOCARDIOGRAM N/A 03/23/2014   Procedure: INTRAOPERATIVE TRANSESOPHAGEAL ECHOCARDIOGRAM;  Surgeon: Sherren Mocha, MD;  Location: [facility] OR;   Service: Open Heart Surgery;  Laterality: N/A;  . JOINT REPLACEMENT    . LEFT AND RIGHT HEART CATHETERIZATION WITH CORONARY ANGIOGRAM N/A 02/08/2014   Procedure: LEFT AND RIGHT HEART CATHETERIZATION WITH CORONARY ANGIOGRAM;  Surgeon: Blane Ohara, MD;  Location: [facility] CATH LAB;  Service: Cardiovascular;  Laterality: N/A;  . TEE WITHOUT CARDIOVERSION N/A 09/02/2013   Procedure: TRANSESOPHAGEAL ECHOCARDIOGRAM (TEE);  Surgeon: Dorothy Spark, MD;  Location: [place];  Service: Cardiovascular;  Laterality: N/A;  . TONSILLECTOMY    . TRANSCATHETER AORTIC VALVE REPLACEMENT, TRANSFEMORAL N/A 03/23/2014   Procedure: TRANSCATHETER AORTIC VALVE REPLACEMENT, TRANSFEMORAL;  Surgeon: Sherren Mocha, MD;  Location: [place];  Service: Open Heart Surgery;  Laterality: N/A;  valve in valve TAVR  . WISDOM TOOTH EXTRACTION       Medications: Current Meds  Medication Sig  . acetaminophen (TYLENOL) 500 MG tablet Take 1,000 mg by mouth every 4 (four) hours as needed.  Marland Kitchen aspirin 81 MG tablet Take 81 mg by mouth daily.  Marland Kitchen atorvastatin (LIPITOR) 80 MG tablet TAKE 1 TABLET BY MOUTH  DAILY  . B Complex-C (SUPER B COMPLEX) TABS Take 1 tablet by mouth daily.  . clindamycin (CLEOCIN) 150 MG capsule TK 4 CS PO PRIOR TO DENTAL APPOINTMENT  . finasteride (PROSCAR) 5 MG tablet TAKE 1 TABLET BY MOUTH  DAILY  . levothyroxine (SYNTHROID, LEVOTHROID) 50 MCG tablet Take 1 tablet (50 mcg total) by mouth daily.  Marland Kitchen losartan (COZAAR) 100 MG tablet TAKE 1 TABLET BY MOUTH  DAILY  . metoprolol tartrate (LOPRESSOR) 25 MG tablet Take 1 tablet (25 mg total) by mouth 2 (two) times daily.  . Multiple Vitamins-Minerals (PRESERVISION AREDS 2 PO) Take 1 capsule by mouth 2 (two) times daily.   Marland Kitchen PARoxetine (PAXIL) 20 MG tablet Take 0.5 tablets (10 mg total) by mouth every morning.     Allergies: Allergies  Allergen Reactions  . Mucinex [Guaifenesin Er]     Tongue swelling  . Amoxicillin-Pot Clavulanate Itching  . Morphine And  Related Nausea And Vomiting  . Penicillins Hives    Social History: The patient  reports that he quit smoking about 36 years ago. His smoking use included Cigarettes. He has a 15.00 pack-year smoking history. He has never used smokeless tobacco. He reports that he drinks about 4.2 oz of alcohol per week . He reports that he does not use drugs.   Family History: The patient's family history includes Aneurysm in his father; Cancer in his brother, daughter, and mother; Colon cancer in his mother; Heart attack in  his brother; Heart disease in his brother; Prostate cancer in his brother.   Review of Systems: Please see the history of present illness.   Otherwise, the review of systems is positive for none.   All other systems are reviewed and negative.   Physical Exam: VS:  BP 140/82 (BP Location: Left Arm, Patient Position: Sitting, Cuff Size: Normal)   Pulse (!) 57   Ht 5\' 6"  (1.676 m)   Wt 194 lb 3.2 oz (88.1 kg)   BMI 31.34 kg/m  .  BMI Body mass index is 31.34 kg/m.  Wt Readings from Last 3 Encounters:  03/06/17 194 lb 3.2 oz (88.1 kg)  11/28/16 195 lb 12.8 oz (88.8 kg)  09/10/16 197 lb 6.4 oz (89.5 kg)    General: Pleasant. Well developed, well nourished and in no acute distress.   HEENT: Normal.  Neck: Supple, no JVD, carotid bruits, or masses noted.  Cardiac: Regular rate and rhythm. No murmurs, rubs, or gallops. No edema.  Respiratory:  Lungs are clear to auscultation bilaterally with normal work of breathing.  GI: Soft and nontender.  MS: No deformity or atrophy. Gait and ROM intact.  Skin: Warm and dry. Color is normal.  Neuro:  Strength and sensation are intact and no gross focal deficits noted.  Psych: Alert, appropriate and with normal affect.   LABORATORY DATA:  EKG:  EKG is ordered today. This demonstrates sinus bradycardia with 1st degree AV block.  Lab Results  Component Value Date   WBC 8.3 09/10/2016   HGB 14.4 09/10/2016   HCT 42.4 09/10/2016   PLT 138  (L) 09/10/2016   GLUCOSE 96 11/28/2016   CHOL 161 04/17/2016   TRIG 199 (H) 04/17/2016   HDL 33 (L) 04/17/2016   LDLCALC 88 04/17/2016   ALT 19 04/17/2016   AST 24 04/17/2016   NA 142 11/28/2016   K 4.8 11/28/2016   CL 102 11/28/2016   CREATININE 1.14 11/28/2016   BUN 22 11/28/2016   CO2 26 11/28/2016   TSH 4.090 09/10/2016   INR 1.25 03/23/2014   HGBA1C 6.3 (H) 03/19/2014     BNP (last 3 results)  Recent Labs  04/17/16 1142  BNP 70.5    ProBNP (last 3 results) No results for input(s): PROBNP in the last 8760 hours.   Other Studies Reviewed Today:  ECHO Study Conclusions 10/2016  - Left ventricle: The cavity size was normal. Wall thickness was increased in a pattern of moderate LVH. Systolic function was normal. The estimated ejection fraction was in the range of 50% to 55%. Wall motion was normal; there were no regional wall motion abnormalities. Doppler parameters are consistent with abnormal left ventricular relaxation (grade 1 diastolic dysfunction). Doppler parameters are consistent with high ventricular filling pressure. - Aortic valve: A transcatheter aortic valve bioprosthesis was present. There was severe stenosis. Peak velocity (S): 413 cm/s. Mean gradient (S): 41 mm Hg. Valve area (VTI): 0.59 cm^2. Valve area (Vmax): 0.53 cm^2. Valve area (Vmean): 0.57 cm^2. - Mitral valve: Transvalvular velocity was within the normal range. There was no evidence for stenosis. There was mild regurgitation. - Right ventricle: The cavity size was normal. Wall thickness was normal. Systolic function was normal. - Atrial septum: No defect or patent foramen ovale was identified by color flow Doppler. - Tricuspid valve: There was trivial regurgitation. - Pulmonary arteries: Systolic pressure was within the normal range. PA peak pressure: 15 mm Hg (S).  Impressions:  - There is a TAVR bioprosthesis inside a  bioprosthetic aortic valve.  Since the echo 10/27/15, the mean gradient of the TAVR has increased from 35 mmHg to 41 mmHg, conistent with severe stenosis.     ASSESSMENT AND PLAN:  1. Aortic valve disease s/p valve-in-valve TAVR: Pt with NYHA II/II symptoms of exertional dyspnea. Recent echo with worsening AS - no further interventions are warranted - he will be managed medically - this is reiterated again today - restarted Lasix at last visit - he is not taking regularly - explained that this will help with his breathing.   2. CAD - stable without symptoms of angina - would favor conservative management.   3. HTN - BP is fair today on his current regimen.    4. Situational stress/grief - ongoing issue - not really discussed today  5. Fall - high risk for another - needs Life line at home - he has refused.  6. Multiple somatic issues - needs to see PCP - I think I got him to agree to see Dr. Jacelyn Grip.   Current medicines are reviewed with the patient today.  The patient does not have concerns regarding medicines other than what has been noted above.  The following changes have been made:  See above.  Labs/ tests ordered today include:   No orders of the defined types were placed in this encounter.    Disposition:   FU with me in 4 months with labs.   Patient is agreeable to this plan and will call if any problems develop in the interim.   SignedTruitt Merle, NP  03/06/2017 12:33 PM  [facility] [address] [place], [place]  [address] Phone: [phone] Fax: [phone]

## 2017-03-06 NOTE — Patient Instructions (Addendum)
We will be checking the following labs today - NONE   Medication Instructions:    Continue with your current medicines.     Testing/Procedures To Be Arranged:  N/A  Follow-Up:   See me in 4 months with labs   Other Special Instructions:   N/A    If you need a refill on your cardiac medications before your next appointment, please call your pharmacy.   Call the [place] office at [phone] if you have any questions, problems or concerns.

## 2017-03-20 ENCOUNTER — Other Ambulatory Visit: Payer: Self-pay | Admitting: Nurse Practitioner

## 2017-04-22 ENCOUNTER — Other Ambulatory Visit (HOSPITAL_COMMUNITY): Payer: Medicare Other

## 2017-04-22 ENCOUNTER — Encounter (HOSPITAL_COMMUNITY): Payer: Medicare Other

## 2017-04-22 ENCOUNTER — Ambulatory Visit: Payer: Medicare Other | Admitting: Family

## 2017-05-22 ENCOUNTER — Encounter: Payer: Self-pay | Admitting: Family

## 2017-05-22 ENCOUNTER — Ambulatory Visit (HOSPITAL_COMMUNITY)
Admission: RE | Admit: 2017-05-22 | Discharge: 2017-05-22 | Disposition: A | Discharge status: Home / Self Care | Payer: Medicare Other | Source: Ambulatory Visit | Attending: Family | Admitting: Family

## 2017-05-22 ENCOUNTER — Ambulatory Visit (INDEPENDENT_AMBULATORY_CARE_PROVIDER_SITE_OTHER): Payer: Medicare Other | Admitting: Family

## 2017-05-22 VITALS — BP 149/76 | HR 56 | Temp 96.6°F | Resp 18 | Ht 66.0 in | Wt 188.0 lb

## 2017-05-22 DIAGNOSIS — Z95828 Presence of other vascular implants and grafts: Secondary | ICD-10-CM

## 2017-05-22 DIAGNOSIS — R0989 Other specified symptoms and signs involving the circulatory and respiratory systems: Secondary | ICD-10-CM

## 2017-05-22 DIAGNOSIS — I259 Chronic ischemic heart disease, unspecified: Secondary | ICD-10-CM | POA: Diagnosis not present

## 2017-05-22 DIAGNOSIS — I714 Abdominal aortic aneurysm, without rupture, unspecified: Secondary | ICD-10-CM

## 2017-05-22 NOTE — Progress Notes (Signed)
[facility] [place]  CC: Follow up s/p Endovascular Repair of Abdominal Aortic Aneurysm    History of Present Illness  [name] [name] is a 83 y.o. (07/24/1922) male  patient of Dr. Trula Slade who is back today for followup. He is status post endovascular repair of abdominal aortic aneurysm by Dr. Amedeo Plenty in 2004 using a AneuRx stent graft. He has no complaints. He denies back pain. He denies abdominal pain.  Dr. Trula Slade last saw pt on 04/06/13. At that time the maximum excluded sac diameter was 3.4 cm which was different from the prior study and more consistent with his previous studies The patient's aneurysm continued to decrease in size. Dr. Trula Slade suspected the study 6 months prior was inaccurate.  He had a repeat aortic valve replacement in 2015. He takes no anticoagulants. He does take a daily 81 mg ASA.  He takes Lipitor and states "Im sure that affects my muscles, I can get cramps in my muscles anywhere."  The patient denies claudication in legs with walking. The patient reports history of stroke about 2006, denies residual speech difficulties, deneis hemiparesis, denies monocular loss of vision.  His wife died in 11/20/15, had Alzheimer's Disease, while in a nursing home.   He reports intermittent dyspnea, states it is not getting worse.   Pt Diabetic: No Pt smoker: former smoker, quit about 1982    Past Medical History:  Diagnosis Date  . AAA (abdominal aortic aneurysm) ([place])    s/p endovascular repair 2004  . Aortic insufficiency    Prosthetic valve dysfunction  . Aortic stenosis    s/p AVR in 2003, bovine  . Arthritis   . BCC (basal cell carcinoma) 09/17/2012  . BPH (benign prostatic hyperplasia)   . CAD (coronary artery disease)    stents in [place] and has a totally occluded RCA  . Chicken pox   . CVA (cerebral infarction) 12/09  . Depression   . Diverticulosis   . Glaucoma    recently told by Dr Janyth Contes he did not have it  . HTN  (hypertension)   . Hyperlipemia   . Leaky heart valve   . Measles   . Mumps   . Myocardial infarction ([place])   . Personal history of poliomyelitis 09/17/2012   In childhood  . Pneumonia    hx of  . Polio   . Prosthetic valve dysfunction    Aortic stenosis and insufficiency   . Right iliac artery stenosis ([facility])   . 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 Shortness of breath    exertion, orthopnea  . Sleeping difficulties   . Stroke [facility])    no residual  . Thyroid disease    Past Surgical History:  Procedure Laterality Date  . ABDOMINAL AORTIC ANEURYSM REPAIR  2004   s/p endovascular repiar of AAA in 2004  . AORTIC VALVE REPLACEMENT  2003   #90mm pericardial valve   . APPENDECTOMY    . CARDIAC CATHETERIZATION    . CORONARY ANGIOPLASTY    . CORONARY ARTERY BYPASS GRAFT    . CORONARY STENT PLACEMENT    . INTRAOPERATIVE TRANSESOPHAGEAL ECHOCARDIOGRAM N/A 03/23/2014   Procedure: INTRAOPERATIVE TRANSESOPHAGEAL ECHOCARDIOGRAM;  Surgeon: Sherren Mocha, MD;  Location: [facility] OR;  Service: Open Heart Surgery;  Laterality: N/A;  . JOINT REPLACEMENT    . LEFT AND RIGHT HEART CATHETERIZATION WITH CORONARY ANGIOGRAM N/A 02/08/2014  Procedure: LEFT AND RIGHT HEART CATHETERIZATION WITH CORONARY ANGIOGRAM;  Surgeon: Blane Ohara, MD;  Location: [facility] CATH LAB;  Service: Cardiovascular;  Laterality: N/A;  . TEE WITHOUT CARDIOVERSION N/A 09/02/2013   Procedure: TRANSESOPHAGEAL ECHOCARDIOGRAM (TEE);  Surgeon: Dorothy Spark, MD;  Location: [facility];  Service: Cardiovascular;  Laterality: N/A;  . TONSILLECTOMY    . TRANSCATHETER AORTIC VALVE REPLACEMENT, TRANSFEMORAL N/A 03/23/2014   Procedure: TRANSCATHETER AORTIC VALVE REPLACEMENT, TRANSFEMORAL;  Surgeon: Sherren Mocha, MD;  Location: [facility];  Service: Open Heart Surgery;  Laterality: N/A;  valve in valve TAVR  . WISDOM TOOTH  EXTRACTION     Social History Social History  Substance Use Topics  . Smoking status: Former Smoker    Packs/day: 0.50    Years: 30.00    Types: Cigarettes    Quit date: 10/04/1980  . Smokeless tobacco: Never Used  . Alcohol use 4.2 oz/week    7 Shots of liquor per week     Comment: cocktails/night   Family History Family History  Problem Relation Age of Onset  . Colon cancer Mother   . Cancer Mother   . Aneurysm Father   . Heart attack Brother        CHF  . Prostate cancer Brother   . Cancer Brother   . Heart disease Brother   . Cancer Daughter    Current Outpatient Prescriptions on File Prior to Visit  Medication Sig Dispense Refill  . acetaminophen (TYLENOL) 500 MG tablet Take 1,000 mg by mouth every 4 (four) hours as needed.    Marland Kitchen aspirin 81 MG tablet Take 81 mg by mouth daily.    Marland Kitchen atorvastatin (LIPITOR) 80 MG tablet TAKE 1 TABLET BY MOUTH  DAILY 90 tablet 3  . B Complex-C (SUPER B COMPLEX) TABS Take 1 tablet by mouth daily.    . finasteride (PROSCAR) 5 MG tablet TAKE 1 TABLET BY MOUTH  DAILY 90 tablet 2  . furosemide (LASIX) 40 MG tablet Take 0.5 tablets (20 mg total) by mouth daily. 90 tablet 3  . levothyroxine (SYNTHROID, LEVOTHROID) 50 MCG tablet Take 1 tablet (50 mcg total) by mouth daily. 90 tablet 3  . losartan (COZAAR) 100 MG tablet TAKE 1 TABLET BY MOUTH  DAILY 90 tablet 3  . metoprolol tartrate (LOPRESSOR) 25 MG tablet Take 1 tablet (25 mg total) by mouth 2 (two) times daily. 180 tablet 3  . Multiple Vitamins-Minerals (PRESERVISION AREDS 2 PO) Take 1 capsule by mouth 2 (two) times daily.     Marland Kitchen PARoxetine (PAXIL) 20 MG tablet Take 0.5 tablets (10 mg total) by mouth every morning. 45 tablet 3  . potassium chloride (K-DUR) 10 MEQ tablet Take 1 tablet (10 mEq total) by mouth daily. Please keep 07/02/17 appt for future authorization 30 tablet 3  . clindamycin (CLEOCIN) 150 MG capsule TK 4 CS PO PRIOR TO DENTAL APPOINTMENT  0   No current facility-administered  medications on file prior to visit.    Allergies  Allergen Reactions  . Mucinex [Guaifenesin Er]     Tongue swelling  . Amoxicillin-Pot Clavulanate Itching  . Morphine And Related Nausea And Vomiting  . Penicillins Hives     ROS: See HPI for pertinent positives and negatives.  Physical Examination  Vitals:   05/22/17 1120 05/22/17 1134  BP: 129/66 (!) 149/76  Pulse: (!) 56 (!) 56  Resp: 18   Temp: (!) 96.6 F (35.9 C)   SpO2: 94%   Weight: 188 lb (85.3 kg)  Height: 5\' 6"  (1.676 m)    Body mass index is 30.34 kg/m.  General:A&O x 3, WD, obese male.  Pulmonary: Sym exp, fair air movt, CTAB, no rales, rhonchi, or wheezing. Respirations are non labored.  Cardiac: RRR, Nl S1, S2, no detected murmur.   Carotid Bruits Right Left   Positive Positive   Abdominal aortic pulse is not palpable Radial pulses are 2+ palpable and =   VASCULAR EXAM:  LE Pulses Right Left  FEMORAL not palpable (obese) not palpable (obese)  POPLITEAL not palpable not palpable  POSTERIOR TIBIAL 2+ palpable 2+ palpable  DORSALIS PEDIS ANTERIOR TIBIAL not palpable not palpable     Gastrointestinal: soft, NTND, -G/R, - HSM, - masses palpated, - CVAT B.  Musculoskeletal: M/S 5/5 throughout, Extremities without ischemic changes.  Neurologic: CN 2-12 intact except is hard of hearing, Pain and light touch intact in extremities are intact, Motor exam as listed above    DATA  Carotid Duplex (05/22/17): <40% bilateral ICA stenosis. Left ECA with >50% stenosis. Bilateral vertebral artery flow is antegrade.  Bilateral subclavian artery waveforms are normal.  No change compared to the exam on 03-19-14 at [facility].   EVAR Duplex (Date: 05/22/17):  AAA sac size: 3.5 cm x 3.7 cm; Right CIA: 1.56 cm; Left CIA: 1.37 cm  no endoleak detected  Previous (04-16-16): 3.5 cm x 3.5 cm    02/18/14 CTA abd/pelvis requested by Dr.  Sherren Mocha:  Extensive atherosclerosis throughout the abdominal and pelvic vasculature, including vascular findings and measurements pertinent to potential TAVR procedure, as detailed below. Post procedural changes of aortobi-iliac endograft are noted, originating immediately beneath the level the renal artery origins extending into the mid common iliac arteries bilaterally. The aortic and iliac portions of the graft are all widely patent, without evidence of in stent restenosis. The excluded aneurysm sac currently measures 3.7 x 4.0 cm. No high attenuation is identified within the excluded aneurysm sac to suggest presence of endoleak on today's arterial phase examination.   Medical Decision Making  [name] [name] is a 83 y.o. male who presents s/p EVAR (Date: 2004).  Pt is asymptomatic with stable sac size.  I discussed with the patient the importance of surveillance of the endograft.  The next endograft duplex will be scheduled for 12 months.  The patient will follow up with [place] in 12 months with these studies. No need to recheck carotid duplex.  I emphasized the importance of maximal medical management including strict control of blood pressure, blood glucose, and lipid levels, antiplatelet agents, obtaining regular exercise, and cessation of smoking.   Thank you for allowing [place] to participate in this patient's care.  Clemon Chambers, RN, MSN, FNP-C [facility] [place] Office: Corn: Chen/Early  05/22/2017, 11:45 AM

## 2017-05-22 NOTE — Patient Instructions (Signed)
Stroke Prevention Some medical conditions and behaviors are associated with an increased chance of having a stroke. You may prevent a stroke by making healthy choices and managing medical conditions. How can I reduce my risk of having a stroke?  Stay physically active. Get at least 30 minutes of activity on most or all days.  Do not smoke. It may also be helpful to avoid exposure to secondhand smoke.  Limit alcohol use. Moderate alcohol use is considered to be:  No more than 2 drinks per day for men.  No more than 1 drink per day for nonpregnant women.  Eat healthy foods. This involves:  Eating 5 or more servings of fruits and vegetables a day.  Making dietary changes that address high blood pressure (hypertension), high cholesterol, diabetes, or obesity.  Manage your cholesterol levels.  Making food choices that are high in fiber and low in saturated fat, trans fat, and cholesterol may control cholesterol levels.  Take any prescribed medicines to control cholesterol as directed by your health care provider.  Manage your diabetes.  Controlling your carbohydrate and sugar intake is recommended to manage diabetes.  Take any prescribed medicines to control diabetes as directed by your health care provider.  Control your hypertension.  Making food choices that are low in salt (sodium), saturated fat, trans fat, and cholesterol is recommended to manage hypertension.  Ask your health care provider if you need treatment to lower your blood pressure. Take any prescribed medicines to control hypertension as directed by your health care provider.  If you are 18-39 years of age, have your blood pressure checked every 3-5 years. If you are 40 years of age or older, have your blood pressure checked every year.  Maintain a healthy weight.  Reducing calorie intake and making food choices that are low in sodium, saturated fat, trans fat, and cholesterol are recommended to manage  weight.  Stop drug abuse.  Avoid taking birth control pills.  Talk to your health care provider about the risks of taking birth control pills if you are over 35 years old, smoke, get migraines, or have ever had a blood clot.  Get evaluated for sleep disorders (sleep apnea).  Talk to your health care provider about getting a sleep evaluation if you snore a lot or have excessive sleepiness.  Take medicines only as directed by your health care provider.  For some people, aspirin or blood thinners (anticoagulants) are helpful in reducing the risk of forming abnormal blood clots that can lead to stroke. If you have the irregular heart rhythm of atrial fibrillation, you should be on a blood thinner unless there is a good reason you cannot take them.  Understand all your medicine instructions.  Make sure that other conditions (such as anemia or atherosclerosis) are addressed. Get help right away if:  You have sudden weakness or numbness of the face, arm, or leg, especially on one side of the body.  Your face or eyelid droops to one side.  You have sudden confusion.  You have trouble speaking (aphasia) or understanding.  You have sudden trouble seeing in one or both eyes.  You have sudden trouble walking.  You have dizziness.  You have a loss of balance or coordination.  You have a sudden, severe headache with no known cause.  You have new chest pain or an irregular heartbeat. Any of these symptoms may represent a serious problem that is an emergency. Do not wait to see if the symptoms will go away.   Get medical help at once. Call your local emergency services (911 in [place] Do not drive yourself to the hospital. This information is not intended to replace advice given to you by your health care provider. Make sure you discuss any questions you have with your health care provider. Document Released: 08/16/2004 Document Revised: 12/15/2015 Document Reviewed: 01/09/2013 Elsevier  Interactive Patient Education  2017 [employer].  

## 2017-06-06 NOTE — Addendum Note (Signed)
Addended by: Lianne Cure A on: 06/06/2017 10:21 AM   Modules accepted: Orders

## 2017-07-02 ENCOUNTER — Ambulatory Visit: Payer: Medicare Other | Admitting: Nurse Practitioner

## 2017-07-05 ENCOUNTER — Telehealth: Payer: Self-pay | Admitting: *Deleted

## 2017-07-05 NOTE — Telephone Encounter (Signed)
S/w pt is aware appt has been changed due to inclement weather, is doing well.

## 2017-07-24 ENCOUNTER — Ambulatory Visit (INDEPENDENT_AMBULATORY_CARE_PROVIDER_SITE_OTHER): Payer: Medicare Other | Admitting: Nurse Practitioner

## 2017-07-24 ENCOUNTER — Encounter: Payer: Self-pay | Admitting: Nurse Practitioner

## 2017-07-24 VITALS — BP 110/70 | HR 98 | Ht 66.0 in | Wt 190.1 lb

## 2017-07-24 DIAGNOSIS — I10 Essential (primary) hypertension: Secondary | ICD-10-CM

## 2017-07-24 DIAGNOSIS — I38 Endocarditis, valve unspecified: Secondary | ICD-10-CM

## 2017-07-24 DIAGNOSIS — Z952 Presence of prosthetic heart valve: Secondary | ICD-10-CM

## 2017-07-24 DIAGNOSIS — I259 Chronic ischemic heart disease, unspecified: Secondary | ICD-10-CM

## 2017-07-24 DIAGNOSIS — I5032 Chronic diastolic (congestive) heart failure: Secondary | ICD-10-CM | POA: Diagnosis not present

## 2017-07-24 LAB — LIPID PANEL
Chol/HDL Ratio: 4.4 ratio (ref 0.0–5.0)
Cholesterol, Total: 166 mg/dL (ref 100–199)
HDL: 38 mg/dL — ABNORMAL LOW (ref >39)
LDL Calculated: 95 mg/dL (ref 0–99)
Triglycerides: 166 mg/dL — ABNORMAL HIGH (ref 0–149)
VLDL Cholesterol Cal: 33 mg/dL (ref 5–40)

## 2017-07-24 LAB — CBC
Hematocrit: 41.6 % (ref 37.5–51.0)
Hemoglobin: 14.4 g/dL (ref 13.0–17.7)
MCH: 33.1 pg — ABNORMAL HIGH (ref 26.6–33.0)
MCHC: 34.6 g/dL (ref 31.5–35.7)
MCV: 96 fL (ref 79–97)
Platelets: 149 10*3/uL — ABNORMAL LOW (ref 150–379)
RBC: 4.35 x10E6/uL (ref 4.14–5.80)
RDW: 12.8 % (ref 12.3–15.4)
WBC: 7.6 10*3/uL (ref 3.4–10.8)

## 2017-07-24 LAB — HEPATIC FUNCTION PANEL
ALT: 22 IU/L (ref 0–44)
AST: 31 IU/L (ref 0–40)
Albumin: 3.8 g/dL (ref 3.2–4.6)
Alkaline Phosphatase: 115 IU/L (ref 39–117)
Bilirubin Total: 1.4 mg/dL — ABNORMAL HIGH (ref 0.0–1.2)
Bilirubin, Direct: 0.31 mg/dL (ref 0.00–0.40)
Total Protein: 6.4 g/dL (ref 6.0–8.5)

## 2017-07-24 LAB — BASIC METABOLIC PANEL
BUN/Creatinine Ratio: 14 (ref 10–24)
BUN: 22 mg/dL (ref 10–36)
CO2: 26 mmol/L (ref 20–29)
Calcium: 10.1 mg/dL (ref 8.6–10.2)
Chloride: 102 mmol/L (ref 96–106)
Creatinine, Ser: 1.53 mg/dL — ABNORMAL HIGH (ref 0.76–1.27)
GFR calc Af Amer: 45 mL/min/{1.73_m2} — ABNORMAL LOW (ref >59)
GFR calc non Af Amer: 39 mL/min/{1.73_m2} — ABNORMAL LOW (ref >59)
Glucose: 148 mg/dL — ABNORMAL HIGH (ref 65–99)
Potassium: 4.5 mmol/L (ref 3.5–5.2)
Sodium: 142 mmol/L (ref 134–144)

## 2017-07-24 LAB — PRO B NATRIURETIC PEPTIDE: NT-Pro BNP: 516 pg/mL — ABNORMAL HIGH (ref 0–486)

## 2017-07-24 MED ORDER — POTASSIUM CHLORIDE ER 10 MEQ PO TBCR: 10.0000 meq | EXTENDED_RELEASE_TABLET | Freq: Every day | ORAL | 3 refills | Status: DC

## 2017-07-24 MED ORDER — METOPROLOL TARTRATE 25 MG PO TABS: 25.0000 mg | ORAL_TABLET | Freq: Two times a day (BID) | ORAL | 3 refills | Status: DC

## 2017-07-24 NOTE — Patient Instructions (Addendum)
We will be checking the following labs today - BMET, CBC, HPF, Lipids, BNP   Medication Instructions:    Continue with your current medicines.   I sent in your refills today for the Metoprolol - get back on this today.     Testing/Procedures To Be Arranged:  N/A  Follow-Up:   See me in about 4 months    Other Special Instructions:   N/A    If you need a refill on your cardiac medications before your next appointment, please call your pharmacy.   Call the [place] office at [phone] if you have any questions, problems or concerns.

## 2017-07-24 NOTE — Progress Notes (Signed)
CARDIOLOGY OFFICE NOTE  Date:  07/24/2017    [name] [name] Date of Birth: [birth year] Medical Record #[id number]  PCP:  Vernie Shanks, MD  Cardiologist:  Jerel Shepherd    Chief Complaint  Patient presents with  . Coronary Artery Disease  . Congestive Heart Failure  . Cardiac Valve Problem    Follow up visit - seen for Dr. Burt Knack    History of Present Illness: [name] [name] is a 84 y.o. male who presents today for a follow up visit. Seen for Dr. Burt Knack. Former patient of Dr. Susa Simmonds.  He has CAD with remote inferior MI, past stent to the LCX and has a totally occluded RCA. Had prior AVR by Dr. Cyndia Bent in 2003, past AAA repair in 2004, PVD, past CVA, HLD, BPH and hypothyroidism. He developed severe bioprosthetic valve aortic insufficiency with worsening heart failure and was ultimately treated with valve and valve TAVR using a 23 mm Edwards Sapian XT valve via a transfemoral approach on 03/23/2014.  I have seen him multiple times over the past few years - wife died. He took this pretty hard. He has had some bouts with volume overload. Last echo with more AS - reviewed with Dr. Burt Knack - managed conservatively now. General decline overall. I last saw him in August. He had refused to get a Lifeline. Kids were pretty frustrated with him. Lots of medical issues - tried to get him back to his PCP.   Comes in today. Here with his daughter today. His oxygen sat is low today - unclear why. Not taking his potassium every day - actually hard to say what he is taking. He has been out of his Metoprolol - first he said for a day or so - then probably has been out since before Christmas. He is short of breath. Says he is short of breath "all the time". Still driving to the store and out to lunch. Not very active otherwise. The weather has really limited his going out. More unsteady on his feet. Tends to not take his diuretic due to incontinence. No chest pain. Still wanting to know what "are  we going to do about my valve".   Past Medical History:  Diagnosis Date  . AAA (abdominal aortic aneurysm) ([place])    s/p endovascular repair 2004  . Aortic insufficiency    Prosthetic valve dysfunction  . Aortic stenosis    s/p AVR in 2003, bovine  . Arthritis   . BCC (basal cell carcinoma) 09/17/2012  . BPH (benign prostatic hyperplasia)   . CAD (coronary artery disease)    stents in [place] and has a totally occluded RCA  . Chicken pox   . CVA (cerebral infarction) 12/09  . Depression   . Diverticulosis   . Glaucoma    recently told by Dr Janyth Contes he did not have it  . HTN (hypertension)   . Hyperlipemia   . Leaky heart valve   . Measles   . Mumps   . Myocardial infarction ([place])   . Personal history of poliomyelitis 09/17/2012   In childhood  . Pneumonia    hx of  . Polio   . Prosthetic valve dysfunction    Aortic stenosis and insufficiency   . Right iliac artery stenosis (HCC)   . 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 Shortness of breath    exertion, orthopnea  .  Sleeping difficulties   . Stroke [facility])    no residual  . Thyroid disease     Past Surgical History:  Procedure Laterality Date  . ABDOMINAL AORTIC ANEURYSM REPAIR  2004   s/p endovascular repiar of AAA in 2004  . AORTIC VALVE REPLACEMENT  2003   #70mm pericardial valve   . APPENDECTOMY    . CARDIAC CATHETERIZATION    . CORONARY ANGIOPLASTY    . CORONARY ARTERY BYPASS GRAFT    . CORONARY STENT PLACEMENT    . INTRAOPERATIVE TRANSESOPHAGEAL ECHOCARDIOGRAM N/A 03/23/2014   Procedure: INTRAOPERATIVE TRANSESOPHAGEAL ECHOCARDIOGRAM;  Surgeon: Sherren Mocha, MD;  [facility];  Service: Open Heart Surgery;  Laterality: N/A;  . JOINT REPLACEMENT    . LEFT AND RIGHT HEART CATHETERIZATION WITH CORONARY ANGIOGRAM N/A 02/08/2014   Procedure: LEFT AND RIGHT HEART CATHETERIZATION WITH CORONARY  ANGIOGRAM;  Surgeon: Blane Ohara, MD;  Location: [facility] CATH LAB;  Service: Cardiovascular;  Laterality: N/A;  . TEE WITHOUT CARDIOVERSION N/A 09/02/2013   Procedure: TRANSESOPHAGEAL ECHOCARDIOGRAM (TEE);  Surgeon: Dorothy Spark, MD;  Location: [place];  Service: Cardiovascular;  Laterality: N/A;  . TONSILLECTOMY    . TRANSCATHETER AORTIC VALVE REPLACEMENT, TRANSFEMORAL N/A 03/23/2014   Procedure: TRANSCATHETER AORTIC VALVE REPLACEMENT, TRANSFEMORAL;  Surgeon: Sherren Mocha, MD;  Location: [place];  Service: Open Heart Surgery;  Laterality: N/A;  valve in valve TAVR  . WISDOM TOOTH EXTRACTION       Medications: Current Meds  Medication Sig  . acetaminophen (TYLENOL) 500 MG tablet Take 1,000 mg by mouth every 4 (four) hours as needed.  Marland Kitchen aspirin 81 MG tablet Take 81 mg by mouth daily.  Marland Kitchen atorvastatin (LIPITOR) 80 MG tablet TAKE 1 TABLET BY MOUTH  DAILY  . B Complex-C (SUPER B COMPLEX) TABS Take 1 tablet by mouth daily.  . clindamycin (CLEOCIN) 150 MG capsule TK 4 CS PO PRIOR TO DENTAL APPOINTMENT  . finasteride (PROSCAR) 5 MG tablet TAKE 1 TABLET BY MOUTH  DAILY  . furosemide (LASIX) 40 MG tablet Take 0.5 tablets (20 mg total) by mouth daily.  Marland Kitchen levothyroxine (SYNTHROID, LEVOTHROID) 50 MCG tablet Take 1 tablet (50 mcg total) by mouth daily.  Marland Kitchen losartan (COZAAR) 100 MG tablet TAKE 1 TABLET BY MOUTH  DAILY  . metoprolol tartrate (LOPRESSOR) 25 MG tablet Take 1 tablet (25 mg total) by mouth 2 (two) times daily.  . Multiple Vitamins-Minerals (PRESERVISION AREDS 2 PO) Take 1 capsule by mouth 2 (two) times daily.   Marland Kitchen PARoxetine (PAXIL) 20 MG tablet Take 0.5 tablets (10 mg total) by mouth every morning.  . potassium chloride (K-DUR) 10 MEQ tablet Take 1 tablet (10 mEq total) by mouth daily.  . [DISCONTINUED] metoprolol tartrate (LOPRESSOR) 25 MG tablet Take 1 tablet (25 mg total) by mouth 2 (two) times daily.  . [DISCONTINUED] potassium chloride (K-DUR) 10 MEQ tablet Take 1 tablet (10 mEq  total) by mouth daily. Please keep 07/02/17 appt for future authorization  . [DISCONTINUED] potassium chloride (K-DUR,KLOR-CON) 10 MEQ tablet TK 1 T PO D . PLEASE KEEP 07/02/17 APPOINTMENT FOR FUTURE AUTHORIZATION     Allergies: Allergies  Allergen Reactions  . Mucinex [Guaifenesin Er]     Tongue swelling  . Amoxicillin-Pot Clavulanate Itching  . Morphine And Related Nausea And Vomiting  . Penicillins Hives    Social History: The patient  reports that he quit smoking about 36 years ago. His smoking use included cigarettes. He has a 15.00 pack-year smoking history. he has never  used smokeless tobacco. He reports that he drinks about 4.2 oz of alcohol per week. He reports that he does not use drugs.   Family History: The patient's family history includes Aneurysm in his father; Cancer in his brother, daughter, and mother; Colon cancer in his mother; Heart attack in his brother; Heart disease in his brother; Prostate cancer in his brother.   Review of Systems: Please see the history of present illness.   Otherwise, the review of systems is positive for none.   All other systems are reviewed and negative.   Physical Exam: VS:  BP 110/70 (BP Location: Left Arm, Patient Position: Sitting, Cuff Size: Normal)   Pulse 98   Ht 5\' 6"  (1.676 m)   Wt 190 lb 1.9 oz (86.2 kg)   BMI 30.69 kg/m  .  BMI Body mass index is 30.69 kg/m.  Wt Readings from Last 3 Encounters:  07/24/17 190 lb 1.9 oz (86.2 kg)  05/22/17 188 lb (85.3 kg)  03/06/17 194 lb 3.2 oz (88.1 kg)    General: Pleasant. Elderly. Alert and in no acute distress.Not as belligerent today.  His sat is about 90%. HR is 100 by me.   HEENT: Normal.  Neck: Supple, no JVD, carotid bruits, or masses noted.  Cardiac: Regular rate and rhythm. Harsh outflow murmur. No edema.  Respiratory:  Lungs are clear to auscultation bilaterally with normal work of breathing.  GI: Soft and nontender.  MS: No deformity or atrophy. Gait and ROM intact.    Skin: Warm and dry. Color is normal.  Neuro:  Strength and sensation are intact and no gross focal deficits noted.  Psych: Alert, appropriate and with normal affect.   LABORATORY DATA:  EKG:  EKG is not ordered today.   Lab Results  Component Value Date   WBC 8.3 09/10/2016   HGB 14.4 09/10/2016   HCT 42.4 09/10/2016   PLT 138 (L) 09/10/2016   GLUCOSE 96 11/28/2016   CHOL 161 04/17/2016   TRIG 199 (H) 04/17/2016   HDL 33 (L) 04/17/2016   LDLCALC 88 04/17/2016   ALT 19 04/17/2016   AST 24 04/17/2016   NA 142 11/28/2016   K 4.8 11/28/2016   CL 102 11/28/2016   CREATININE 1.14 11/28/2016   BUN 22 11/28/2016   CO2 26 11/28/2016   TSH 4.090 09/10/2016   INR 1.25 03/23/2014   HGBA1C 6.3 (H) 03/19/2014     BNP (last 3 results) No results for input(s): BNP in the last 8760 hours.  ProBNP (last 3 results) No results for input(s): PROBNP in the last 8760 hours.   Other Studies Reviewed Today:  ECHO Study Conclusions 10/2016  - Left ventricle: The cavity size was normal. Wall thickness was increased in a pattern of moderate LVH. Systolic function was normal. The estimated ejection fraction was in the range of 50% to 55%. Wall motion was normal; there were no regional wall motion abnormalities. Doppler parameters are consistent with abnormal left ventricular relaxation (grade 1 diastolic dysfunction). Doppler parameters are consistent with high ventricular filling pressure. - Aortic valve: A transcatheter aortic valve bioprosthesis was present. There was severe stenosis. Peak velocity (S): 413 cm/s. Mean gradient (S): 41 mm Hg. Valve area (VTI): 0.59 cm^2. Valve area (Vmax): 0.53 cm^2. Valve area (Vmean): 0.57 cm^2. - Mitral valve: Transvalvular velocity was within the normal range. There was no evidence for stenosis. There was mild regurgitation. - Right ventricle: The cavity size was normal. Wall thickness was normal. Systolic function was  normal. - Atrial septum: No defect or patent foramen ovale was identified by color flow Doppler. - Tricuspid valve: There was trivial regurgitation. - Pulmonary arteries: Systolic pressure was within the normal range. PA peak pressure: 15 mm Hg (S).  Impressions:  - There is a TAVR bioprosthesis inside a bioprosthetic aortic valve. Since the echo 10/27/15, the mean gradient of the TAVR has increased from 35 mmHg to 41 mmHg, conistent with severe stenosis.     ASSESSMENT AND PLAN:  1. Aortic valve disease s/p valve-in-valve TAVR: Pt with NYHA II/II symptoms of exertional dyspnea. His echo from April of 2018 with worsening AS - no further interventions are warranted - he will be managed medically - this has been reiterated to him several times and again today. Needs to take his medicines consistently. Lab today.   2. CAD - no active chest pain - restarting beta blocker today.   3. HTN - BP is ok on his current regimen.   4. Situational stress/grief - ongoing issue - not really discussed today  5. Fall - high risk for another - needs Life line at home - he has refused.  6. Advancing age - explained how safety needs to be primary goal going forward. Have asked family to periodically oversee his medicines, ride with him in the car, etc.   Current medicines are reviewed with the patient today.  The patient does not have concerns regarding medicines other than what has been noted above.  The following changes have been made:  See above.  Labs/ tests ordered today include:    Orders Placed This Encounter  Procedures  . Basic metabolic panel  . CBC  . Hepatic function panel  . Lipid panel  . Pro b natriuretic peptide (BNP)     Disposition:   FU with me in 4 months.   Patient is agreeable to this plan and will call if any problems develop in the interim.   SignedTruitt Merle, NP  07/24/2017 11:52 AM  [facility] [address] [place], [place]  [address] Phone: [phone] Fax: [phone]

## 2017-09-16 ENCOUNTER — Other Ambulatory Visit: Payer: Self-pay | Admitting: Nurse Practitioner

## 2017-11-13 ENCOUNTER — Encounter: Payer: Self-pay | Admitting: Nurse Practitioner

## 2017-11-13 ENCOUNTER — Ambulatory Visit (INDEPENDENT_AMBULATORY_CARE_PROVIDER_SITE_OTHER): Payer: Medicare Other | Admitting: Nurse Practitioner

## 2017-11-13 VITALS — BP 138/70 | HR 62 | Ht 66.0 in | Wt 191.8 lb

## 2017-11-13 DIAGNOSIS — I259 Chronic ischemic heart disease, unspecified: Secondary | ICD-10-CM | POA: Diagnosis not present

## 2017-11-13 DIAGNOSIS — I10 Essential (primary) hypertension: Secondary | ICD-10-CM | POA: Diagnosis not present

## 2017-11-13 DIAGNOSIS — Z952 Presence of prosthetic heart valve: Secondary | ICD-10-CM | POA: Diagnosis not present

## 2017-11-13 NOTE — Patient Instructions (Signed)
We will be checking the following labs today - BMET   Medication Instructions:    Continue with your current medicines.     Testing/Procedures To Be Arranged:  N/A  Follow-Up:   See me in 4 months    Other Special Instructions:   N/A    If you need a refill on your cardiac medications before your next appointment, please call your pharmacy.   Call the [place] office at [phone] if you have any questions, problems or concerns.

## 2017-11-13 NOTE — Progress Notes (Addendum)
CARDIOLOGY OFFICE NOTE  Date:  11/13/2017     [name] Date of Birth: [birth year] Medical Record #[id number]  PCP:  Vernie Shanks, MD  Cardiologist:  Jerel Shepherd    Chief Complaint  Patient presents with  . Coronary Artery Disease  . Cardiac Valve Problem  . Congestive Heart Failure    3 month check - seen for Dr. Burt Knack    History of Present Illness: [name] [name] is a 84 y.o. male who presents today for a 3 month check. Seen for Dr. Burt Knack.Former patient of Dr. Susa Simmonds.  He has CAD with remote inferior MI, past stent to the LCX and has a totally occluded RCA. Had prior AVR by Dr. Cyndia Bent in 2003, past AAA repair in 2004, PVD, past CVA, HLD, BPH and hypothyroidism. He developed severe bioprosthetic valve aortic insufficiency with worsening heart failure and was ultimately treated with valve and valve TAVR using a 23 mm Edwards Sapian XT valve via a transfemoral approach on 03/23/2014.  I have seen him multiple times over the past few years - wife died. He took this pretty hard. He has had some bouts with volume overload. Last echo with more AS - reviewed with Dr. Burt Knack - managed conservatively now. General decline overall. I last saw him in January of 2019. He has refused to get a Lifeline. Kids have been pretty frustrated with him. Lots of medical issues - tried to get him back to his PCP. At last visit he was out of medicines, not taking correctly and short of breath. Not very active.   Comes in today. [place] today. Seems to be doing ok. Medicines unclear. Says he saw Dr. Jacelyn Grip last week - no labs. Says he is doing ok overall. Says "age is catching up with me". No chest pain. Breathing a "little flat". Seems more concerned about the over production of ear wax.   Past Medical History:  Diagnosis Date  . AAA (abdominal aortic aneurysm) ([place])    s/p endovascular repair 2004  . Aortic insufficiency    Prosthetic valve dysfunction  . Aortic stenosis    s/p  AVR in 2003, bovine  . Arthritis   . BCC (basal cell carcinoma) 09/17/2012  . BPH (benign prostatic hyperplasia)   . CAD (coronary artery disease)    stents in [place] and has a totally occluded RCA  . Chicken pox   . CVA (cerebral infarction) 12/09  . Depression   . Diverticulosis   . Glaucoma    recently told by Dr Janyth Contes he did not have it  . HTN (hypertension)   . Hyperlipemia   . Leaky heart valve   . Measles   . Mumps   . Myocardial infarction ([place])   . Personal history of poliomyelitis 09/17/2012   In childhood  . Pneumonia    hx of  . Polio   . Prosthetic valve dysfunction    Aortic stenosis and insufficiency   . Right iliac artery stenosis (HCC)   . 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 Shortness of breath    exertion, orthopnea  . Sleeping difficulties   . Stroke [facility])    no residual  . Thyroid disease     Past Surgical History:  Procedure Laterality Date  . ABDOMINAL AORTIC ANEURYSM REPAIR  2004   s/p endovascular repiar of AAA in 2004  . AORTIC  VALVE REPLACEMENT  2003   #103mm pericardial valve   . APPENDECTOMY    . CARDIAC CATHETERIZATION    . CORONARY ANGIOPLASTY    . CORONARY ARTERY BYPASS GRAFT    . CORONARY STENT PLACEMENT    . INTRAOPERATIVE TRANSESOPHAGEAL ECHOCARDIOGRAM N/A 03/23/2014   Procedure: INTRAOPERATIVE TRANSESOPHAGEAL ECHOCARDIOGRAM;  Surgeon: Sherren Mocha, MD;  Location: [facility] OR;  Service: Open Heart Surgery;  Laterality: N/A;  . JOINT REPLACEMENT    . LEFT AND RIGHT HEART CATHETERIZATION WITH CORONARY ANGIOGRAM N/A 02/08/2014   Procedure: LEFT AND RIGHT HEART CATHETERIZATION WITH CORONARY ANGIOGRAM;  Surgeon: Blane Ohara, MD;  Location: [facility] CATH LAB;  Service: Cardiovascular;  Laterality: N/A;  . TEE WITHOUT CARDIOVERSION N/A 09/02/2013   Procedure: TRANSESOPHAGEAL ECHOCARDIOGRAM (TEE);  Surgeon: Dorothy Spark, MD;   Location: [place];  Service: Cardiovascular;  Laterality: N/A;  . TONSILLECTOMY    . TRANSCATHETER AORTIC VALVE REPLACEMENT, TRANSFEMORAL N/A 03/23/2014   Procedure: TRANSCATHETER AORTIC VALVE REPLACEMENT, TRANSFEMORAL;  Surgeon: Sherren Mocha, MD;  Location: [place];  Service: Open Heart Surgery;  Laterality: N/A;  valve in valve TAVR  . WISDOM TOOTH EXTRACTION       Medications: Current Meds  Medication Sig  . acetaminophen (TYLENOL) 500 MG tablet Take 1,000 mg by mouth every 4 (four) hours as needed.  Marland Kitchen aspirin 81 MG tablet Take 81 mg by mouth daily.  Marland Kitchen atorvastatin (LIPITOR) 80 MG tablet TAKE 1 TABLET BY MOUTH  DAILY  . B Complex-C (SUPER B COMPLEX) TABS Take 1 tablet by mouth daily.  . clindamycin (CLEOCIN) 150 MG capsule TK 4 CS PO PRIOR TO DENTAL APPOINTMENT  . finasteride (PROSCAR) 5 MG tablet TAKE 1 TABLET BY MOUTH  DAILY  . furosemide (LASIX) 40 MG tablet Take 0.5 tablets (20 mg total) by mouth daily.  Marland Kitchen levothyroxine (SYNTHROID, LEVOTHROID) 50 MCG tablet Take 1 tablet (50 mcg total) by mouth daily.  Marland Kitchen losartan (COZAAR) 100 MG tablet TAKE 1 TABLET BY MOUTH  DAILY  . metoprolol tartrate (LOPRESSOR) 25 MG tablet Take 1 tablet (25 mg total) by mouth 2 (two) times daily.  . Multiple Vitamins-Minerals (PRESERVISION AREDS 2 PO) Take 1 capsule by mouth 2 (two) times daily.   Marland Kitchen PARoxetine (PAXIL) 20 MG tablet Take 0.5 tablets (10 mg total) by mouth every morning.  . potassium chloride (K-DUR) 10 MEQ tablet Take 1 tablet (10 mEq total) by mouth daily.     Allergies: Allergies  Allergen Reactions  . Mucinex [Guaifenesin Er]     Tongue swelling  . Amoxicillin-Pot Clavulanate Itching  . Morphine And Related Nausea And Vomiting  . Penicillins Hives    Social History: The patient  reports that he quit smoking about 37 years ago. His smoking use included cigarettes. He has a 15.00 pack-year smoking history. He has never used smokeless tobacco. He reports that he drinks about 4.2 oz  of alcohol per week. He reports that he does not use drugs.   Family History: The patient's family history includes Aneurysm in his father; Cancer in his brother, daughter, and mother; Colon cancer in his mother; Heart attack in his brother; Heart disease in his brother; Prostate cancer in his brother.   Review of Systems: Please see the history of present illness.   Otherwise, the review of systems is positive for none.   All other systems are reviewed and negative.   Physical Exam: VS:  BP 138/70 (BP Location: Left Arm, Patient Position: Sitting, Cuff Size: Normal)  Pulse 62   Ht 5\' 6"  (1.676 m)   Wt 191 lb 12.8 oz (87 kg)   SpO2 92% Comment: at rest  BMI 30.96 kg/m  .  BMI Body mass index is 30.96 kg/m.  Wt Readings from Last 3 Encounters:  11/13/17 191 lb 12.8 oz (87 kg)  07/24/17 190 lb 1.9 oz (86.2 kg)  05/22/17 188 lb (85.3 kg)    General: Pleasant. Well developed, well nourished and in no acute distress.   HEENT: Normal.  Neck: Supple, no JVD, carotid bruits, or masses noted.  Cardiac: Regular rate and rhythm. Not too much of an outflow murmur noted today.  No edema.  Respiratory:  Lungs are clear to auscultation bilaterally with normal work of breathing.  GI: Soft and nontender.  MS: No deformity or atrophy. Gait and ROM intact.  Skin: Warm and dry. Color is normal.  Neuro:  Strength and sensation are intact and no gross focal deficits noted.  Psych: Alert, appropriate and with normal affect.   LABORATORY DATA:  EKG:  EKG is not ordered today.  Lab Results  Component Value Date   WBC 7.6 07/24/2017   HGB 14.4 07/24/2017   HCT 41.6 07/24/2017   PLT 149 (L) 07/24/2017   GLUCOSE 148 (H) 07/24/2017   CHOL 166 07/24/2017   TRIG 166 (H) 07/24/2017   HDL 38 (L) 07/24/2017   LDLCALC 95 07/24/2017   ALT 22 07/24/2017   AST 31 07/24/2017   NA 142 07/24/2017   K 4.5 07/24/2017   CL 102 07/24/2017   CREATININE 1.53 (H) 07/24/2017   BUN 22 07/24/2017   CO2 26  07/24/2017   TSH 4.090 09/10/2016   INR 1.25 03/23/2014   HGBA1C 6.3 (H) 03/19/2014     BNP (last 3 results) No results for input(s): BNP in the last 8760 hours.  ProBNP (last 3 results) Recent Labs    07/24/17 1201  PROBNP 516*     Other Studies Reviewed Today:   ECHO Study Conclusions 10/2016  - Left ventricle: The cavity size was normal. Wall thickness was increased in a pattern of moderate LVH. Systolic function was normal. The estimated ejection fraction was in the range of 50% to 55%. Wall motion was normal; there were no regional wall motion abnormalities. Doppler parameters are consistent with abnormal left ventricular relaxation (grade 1 diastolic dysfunction). Doppler parameters are consistent with high ventricular filling pressure. - Aortic valve: A transcatheter aortic valve bioprosthesis was present. There was severe stenosis. Peak velocity (S): 413 cm/s. Mean gradient (S): 41 mm Hg. Valve area (VTI): 0.59 cm^2. Valve area (Vmax): 0.53 cm^2. Valve area (Vmean): 0.57 cm^2. - Mitral valve: Transvalvular velocity was within the normal range. There was no evidence for stenosis. There was mild regurgitation. - Right ventricle: The cavity size was normal. Wall thickness was normal. Systolic function was normal. - Atrial septum: No defect or patent foramen ovale was identified by color flow Doppler. - Tricuspid valve: There was trivial regurgitation. - Pulmonary arteries: Systolic pressure was within the normal range. PA peak pressure: 15 mm Hg (S).  Impressions:  - There is a TAVR bioprosthesis inside a bioprosthetic aortic valve. Since the echo 10/27/15, the mean gradient of the TAVR has increased from 35 mmHg to 41 mmHg, conistent with severe stenosis.    ASSESSMENT AND PLAN:  1. Aortic valve disease s/p valve-in-valve TAVR: Pt with NYHA II/II symptoms of exertional dyspnea. His echo from April of 2018 with  worsening AS - no further  interventions are warranted - he will be managed medically - this has been reiterated to him several times and again today. Needs to take his medicines consistently. Lab today. He actually looks pretty good today - less shortness of breath - I do not think he needs follow up echo at this time.   2. CAD - no active chest pain - would favor conservative management.   3. HTN - BP isok on his current regimen. BMET today  4. Situational stress/grief - ongoing issue - not really discussed today but he seems better - sounds like his daughter's cancer treatment has gone well.   5. Fall - high risk for another - needs Life line at home - hehas refused. This has not recurred  6. Advancing age   Current medicines are reviewed with the patient today.  The patient does not have concerns regarding medicines other than what has been noted above.  The following changes have been made:  See above.  Labs/ tests ordered today include:    Orders Placed This Encounter  Procedures  . Basic metabolic panel     Disposition:   FU with me in 4 months.   Patient is agreeable to this plan and will call if any problems develop in the interim.   SignedTruitt Merle, NP  11/13/2017 1:47 PM  [place] [address] [place], [place]  [address] Phone: [phone] Fax: [phone]

## 2017-11-14 LAB — BASIC METABOLIC PANEL
BUN/Creatinine Ratio: 19 (ref 10–24)
BUN: 25 mg/dL (ref 10–36)
CO2: 24 mmol/L (ref 20–29)
Calcium: 9 mg/dL (ref 8.6–10.2)
Chloride: 106 mmol/L (ref 96–106)
Creatinine, Ser: 1.35 mg/dL — ABNORMAL HIGH (ref 0.76–1.27)
GFR calc Af Amer: 52 mL/min/{1.73_m2} — ABNORMAL LOW (ref >59)
GFR calc non Af Amer: 45 mL/min/{1.73_m2} — ABNORMAL LOW (ref >59)
Glucose: 85 mg/dL (ref 65–99)
Potassium: 4.5 mmol/L (ref 3.5–5.2)
Sodium: 146 mmol/L — ABNORMAL HIGH (ref 134–144)

## 2017-11-16 ENCOUNTER — Other Ambulatory Visit: Payer: Self-pay | Admitting: Nurse Practitioner

## 2018-02-03 ENCOUNTER — Other Ambulatory Visit: Payer: Self-pay | Admitting: Cardiovascular Disease

## 2018-02-03 MED ORDER — METOPROLOL TARTRATE 25 MG PO TABS: 25.0000 mg | ORAL_TABLET | Freq: Two times a day (BID) | ORAL | 2 refills | Status: DC

## 2018-02-03 MED ORDER — POTASSIUM CHLORIDE ER 10 MEQ PO TBCR: 10.0000 meq | EXTENDED_RELEASE_TABLET | Freq: Every day | ORAL | 2 refills | Status: DC

## 2018-02-06 ENCOUNTER — Other Ambulatory Visit: Payer: Self-pay | Admitting: Cardiovascular Disease

## 2018-02-06 MED ORDER — FUROSEMIDE 40 MG PO TABS: 20.0000 mg | ORAL_TABLET | Freq: Every day | ORAL | 2 refills | Status: DC

## 2018-02-24 ENCOUNTER — Encounter: Payer: Self-pay | Admitting: Nurse Practitioner

## 2018-02-24 ENCOUNTER — Ambulatory Visit (INDEPENDENT_AMBULATORY_CARE_PROVIDER_SITE_OTHER): Payer: Medicare Other | Admitting: Nurse Practitioner

## 2018-02-24 ENCOUNTER — Encounter (INDEPENDENT_AMBULATORY_CARE_PROVIDER_SITE_OTHER): Payer: Self-pay

## 2018-02-24 VITALS — BP 126/80 | HR 63 | Ht 66.0 in | Wt 190.8 lb

## 2018-02-24 DIAGNOSIS — E7800 Pure hypercholesterolemia, unspecified: Secondary | ICD-10-CM

## 2018-02-24 DIAGNOSIS — I259 Chronic ischemic heart disease, unspecified: Secondary | ICD-10-CM

## 2018-02-24 DIAGNOSIS — I10 Essential (primary) hypertension: Secondary | ICD-10-CM | POA: Diagnosis not present

## 2018-02-24 DIAGNOSIS — I38 Endocarditis, valve unspecified: Secondary | ICD-10-CM

## 2018-02-24 DIAGNOSIS — Z952 Presence of prosthetic heart valve: Secondary | ICD-10-CM

## 2018-02-24 DIAGNOSIS — I5032 Chronic diastolic (congestive) heart failure: Secondary | ICD-10-CM

## 2018-02-24 NOTE — Patient Instructions (Addendum)
We will be checking the following labs today - BMET, HPF and lipids   Medication Instructions:    Continue with your current medicines.     Testing/Procedures To Be Arranged:  N/A  Follow-Up:   See me in 4 months    Other Special Instructions:   Use your walker  LifeLine    If you need a refill on your cardiac medications before your next appointment, please call your pharmacy.   Call the [place] office at [phone] if you have any questions, problems or concerns.

## 2018-02-24 NOTE — Progress Notes (Signed)
CARDIOLOGY OFFICE NOTE  Date:  02/24/2018    [name] [name] Date of Birth: 1923/06/08 Medical Record #[id number]  PCP:  Vernie Shanks, MD  Cardiologist:  Jerel Shepherd    Chief Complaint  Patient presents with  . Congestive Heart Failure  . Cardiac Valve Problem    Follow up visit - seen for Dr. Burt Knack    History of Present Illness: [name] [name] is a 84 y.o. male who presents today for a follow up visit. Seen for Dr. Burt Knack.Former patient of Dr. Susa Simmonds.  He has CAD with remote inferior MI, past stent to the LCX and has a totally occluded RCA. Had prior AVR by Dr. Cyndia Bent in 2003, past AAA repair in 2004, PVD, past CVA, HLD, BPH and hypothyroidism. He developed severe bioprosthetic valve aortic insufficiency with worsening heart failure and was ultimately treated with valve and valve TAVR using a 23 mm Edwards Sapian XT valve via a transfemoral approach on 03/23/2014.  I have seen him multiple times over the past few years - his wife has died. He took this pretty hard.He has had some bouts with volume overload. Last echo with more AS - reviewed with Dr. Burt Knack - managed conservatively now. General decline overall.  He has refused to get a Lifeline. Kids have been pretty frustrated with him. Lots of medical issues - tried to get him back to his PCP.His medicines are never correct and unclear.   Last seen in April by me.   Comes in today. Herewith his daughter today. Doing about the same. Pretty inactive for the most part - worse with the heat. She notes that he remains pretty stubborn - this is chronic. He refused to get a Lifeline. He has had another fall - was out in the yard out in the heat as well - near a bunch of yellow jackets which he disturbed - she could not get him up - fortunately he was able to get to his brick patio and could then turn over and get back up. No injury fortunately. He does not wish to use a cane/walker but notes he is shuffling more. [place]  about not going to a nursing home if ever needed. No chest pain. Some occasional dyspnea. Needs labs today.  Overall about the same.   Past Medical History:  Diagnosis Date  . AAA (abdominal aortic aneurysm) ([place])    s/p endovascular repair 2004  . Aortic insufficiency    Prosthetic valve dysfunction  . Aortic stenosis    s/p AVR in 2003, bovine  . Arthritis   . BCC (basal cell carcinoma) 09/17/2012  . BPH (benign prostatic hyperplasia)   . CAD (coronary artery disease)    stents in [place] and has a totally occluded RCA  . Chicken pox   . CVA (cerebral infarction) 12/09  . Depression   . Diverticulosis   . Glaucoma    recently told by Dr Janyth Contes he did not have it  . HTN (hypertension)   . Hyperlipemia   . Leaky heart valve   . Measles   . Mumps   . Myocardial infarction ([place])   . Personal history of poliomyelitis 09/17/2012   In childhood  . Pneumonia    hx of  . Polio   . Prosthetic valve dysfunction    Aortic stenosis and insufficiency   . Right iliac artery stenosis (HCC)   . 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 Shortness of breath    exertion, orthopnea  . Sleeping difficulties   . Stroke [facility])    no residual  . Thyroid disease     Past Surgical History:  Procedure Laterality Date  . ABDOMINAL AORTIC ANEURYSM REPAIR  2004   s/p endovascular repiar of AAA in 2004  . AORTIC VALVE REPLACEMENT  2003   #59mm pericardial valve   . APPENDECTOMY    . CARDIAC CATHETERIZATION    . CORONARY ANGIOPLASTY    . CORONARY ARTERY BYPASS GRAFT    . CORONARY STENT PLACEMENT    . INTRAOPERATIVE TRANSESOPHAGEAL ECHOCARDIOGRAM N/A 03/23/2014   Procedure: INTRAOPERATIVE TRANSESOPHAGEAL ECHOCARDIOGRAM;  Surgeon: Sherren Mocha, MD;  Location[facility];  Service: Open Heart Surgery;  Laterality: N/A;  . JOINT REPLACEMENT    . LEFT AND RIGHT HEART CATHETERIZATION WITH  CORONARY ANGIOGRAM N/A 02/08/2014   Procedure: LEFT AND RIGHT HEART CATHETERIZATION WITH CORONARY ANGIOGRAM;  Surgeon: Blane Ohara, MD;  Location: [facility] CATH LAB;  Service: Cardiovascular;  Laterality: N/A;  . TEE WITHOUT CARDIOVERSION N/A 09/02/2013   Procedure: TRANSESOPHAGEAL ECHOCARDIOGRAM (TEE);  Surgeon: Dorothy Spark, MD;  Location: [place];  Service: Cardiovascular;  Laterality: N/A;  . TONSILLECTOMY    . TRANSCATHETER AORTIC VALVE REPLACEMENT, TRANSFEMORAL N/A 03/23/2014   Procedure: TRANSCATHETER AORTIC VALVE REPLACEMENT, TRANSFEMORAL;  Surgeon: Sherren Mocha, MD;  Location: [place];  Service: Open Heart Surgery;  Laterality: N/A;  valve in valve TAVR  . WISDOM TOOTH EXTRACTION       Medications: Current Meds  Medication Sig  . acetaminophen (TYLENOL) 500 MG tablet Take 1,000 mg by mouth every 4 (four) hours as needed.  Marland Kitchen aspirin 81 MG tablet Take 81 mg by mouth daily.  Marland Kitchen atorvastatin (LIPITOR) 80 MG tablet TAKE 1 TABLET BY MOUTH  DAILY  . B Complex-C (SUPER B COMPLEX) TABS Take 1 tablet by mouth daily.  . clindamycin (CLEOCIN) 150 MG capsule TK 4 CS PO PRIOR TO DENTAL APPOINTMENT  . finasteride (PROSCAR) 5 MG tablet TAKE 1 TABLET BY MOUTH  DAILY  . furosemide (LASIX) 40 MG tablet Take 0.5 tablets (20 mg total) by mouth daily.  Marland Kitchen levothyroxine (SYNTHROID, LEVOTHROID) 50 MCG tablet Take 1 tablet (50 mcg total) by mouth daily.  Marland Kitchen losartan (COZAAR) 100 MG tablet TAKE 1 TABLET BY MOUTH  DAILY  . metoprolol tartrate (LOPRESSOR) 25 MG tablet Take 1 tablet (25 mg total) by mouth 2 (two) times daily.  . Multiple Vitamins-Minerals (PRESERVISION AREDS 2 PO) Take 1 capsule by mouth 2 (two) times daily.   Marland Kitchen PARoxetine (PAXIL) 20 MG tablet Take 0.5 tablets (10 mg total) by mouth every morning.  . potassium chloride (K-DUR) 10 MEQ tablet Take 1 tablet (10 mEq total) by mouth daily.     Allergies: Allergies  Allergen Reactions  . Mucinex [Guaifenesin Er]     Tongue swelling  .  Amoxicillin-Pot Clavulanate Itching  . Morphine And Related Nausea And Vomiting  . Penicillins Hives    Social History: The patient  reports that he quit smoking about 37 years ago. His smoking use included cigarettes. He has a 15.00 pack-year smoking history. He has never used smokeless tobacco. He reports that he drinks about 4.2 oz of alcohol per week. He reports that he does not use drugs.   Family History: The patient's family history includes Aneurysm in his father; Cancer in his brother, daughter, and mother; Colon cancer in his mother;  Heart attack in his brother; Heart disease in his brother; Prostate cancer in his brother.   Review of Systems: Please see the history of present illness.   Otherwise, the review of systems is positive for none.   All other systems are reviewed and negative.   Physical Exam: VS:  BP 126/80 (BP Location: Left Arm, Patient Position: Sitting, Cuff Size: Normal)   Pulse 63   Ht 5\' 6"  (1.676 m)   Wt 190 lb 12.8 oz (86.5 kg)   BMI 30.80 kg/m  .  BMI Body mass index is 30.8 kg/m.  Wt Readings from Last 3 Encounters:  02/24/18 190 lb 12.8 oz (86.5 kg)  11/13/17 191 lb 12.8 oz (87 kg)  07/24/17 190 lb 1.9 oz (86.2 kg)    General: Pleasant. Elderly. Alert and in no acute distress.    HEENT: Normal.  Neck: Supple, no JVD, carotid bruits, or masses noted.  Cardiac: Regular rate and rhythm. Harsh outflow murmur. No edema.  Respiratory:  Lungs are clear to auscultation bilaterally with normal work of breathing.  GI: Soft and nontender.  MS: No deformity or atrophy. Gait and ROM intact. He is unsteady - almost fell off the exam table.  Skin: Warm and dry. Color is normal.  Neuro:  Strength and sensation are intact and no gross focal deficits noted.  Psych: Alert, appropriate and with normal affect.   LABORATORY DATA:  EKG:  EKG is ordered today. This demonstrates NSR with 1st degree AV block, inferior Q's.  Lab Results  Component Value Date    WBC 7.6 07/24/2017   HGB 14.4 07/24/2017   HCT 41.6 07/24/2017   PLT 149 (L) 07/24/2017   GLUCOSE 85 11/13/2017   CHOL 166 07/24/2017   TRIG 166 (H) 07/24/2017   HDL 38 (L) 07/24/2017   LDLCALC 95 07/24/2017   ALT 22 07/24/2017   AST 31 07/24/2017   NA 146 (H) 11/13/2017   K 4.5 11/13/2017   CL 106 11/13/2017   CREATININE 1.35 (H) 11/13/2017   BUN 25 11/13/2017   CO2 24 11/13/2017   TSH 4.090 09/10/2016   INR 1.25 03/23/2014   HGBA1C 6.3 (H) 03/19/2014     BNP (last 3 results) No results for input(s): BNP in the last 8760 hours.  ProBNP (last 3 results) Recent Labs    07/24/17 1201  PROBNP 516*     Other Studies Reviewed Today:  ECHO Study Conclusions 10/2016  - Left ventricle: The cavity size was normal. Wall thickness was increased in a pattern of moderate LVH. Systolic function was normal. The estimated ejection fraction was in the range of 50% to 55%. Wall motion was normal; there were no regional wall motion abnormalities. Doppler parameters are consistent with abnormal left ventricular relaxation (grade 1 diastolic dysfunction). Doppler parameters are consistent with high ventricular filling pressure. - Aortic valve: A transcatheter aortic valve bioprosthesis was present. There was severe stenosis. Peak velocity (S): 413 cm/s. Mean gradient (S): 41 mm Hg. Valve area (VTI): 0.59 cm^2. Valve area (Vmax): 0.53 cm^2. Valve area (Vmean): 0.57 cm^2. - Mitral valve: Transvalvular velocity was within the normal range. There was no evidence for stenosis. There was mild regurgitation. - Right ventricle: The cavity size was normal. Wall thickness was normal. Systolic function was normal. - Atrial septum: No defect or patent foramen ovale was identified by color flow Doppler. - Tricuspid valve: There was trivial regurgitation. - Pulmonary arteries: Systolic pressure was within the normal range. PA peak pressure: 15 mm  Hg  (S).  Impressions:  - There is a TAVR bioprosthesis inside a bioprosthetic aortic valve. Since the echo 10/27/15, the mean gradient of the TAVR has increased from 35 mmHg to 41 mmHg, conistent with severe stenosis.    ASSESSMENT AND PLAN:  1. Aortic valve disease s/p valve-in-valve TAVR: Pt with NYHA II/II symptoms of exertional dyspnea. Hisechofrom April of 2018with worsening AS - no further interventions are warranted - he is managed medically. We have discussed this again today. Medicines are always a little iffy - but he seems to be holding his own.   2. CAD - no active chest pain - would favor conservative management.   3. HTN - BP ok today - no changes made today.   4. Situational stress/grief - ongoing issue - seems ok at this time.    5. Fall - high risk for another - needs Life line at home - he tells me he will get this - hopefully he will follow thru.   6.Advancing age   Current medicines are reviewed with the patient today.  The patient does not have concerns regarding medicines other than what has been noted above.  The following changes have been made:  See above.  Labs/ tests ordered today include:    Orders Placed This Encounter  Procedures  . Basic metabolic panel  . Hepatic function panel  . Lipid panel  . EKG 12-Lead     Disposition:   FU with me in 4 months. Lab today.    Patient is agreeable to this plan and will call if any problems develop in the interim.   SignedTruitt Merle, NP  02/24/2018 2:37 PM  [facility] [address] [place], [place]  [address] Phone: [phone] Fax: [phone]

## 2018-02-25 LAB — LIPID PANEL
Chol/HDL Ratio: 4.9 ratio (ref 0.0–5.0)
Cholesterol, Total: 187 mg/dL (ref 100–199)
HDL: 38 mg/dL — ABNORMAL LOW (ref >39)
LDL Calculated: 112 mg/dL — ABNORMAL HIGH (ref 0–99)
Triglycerides: 187 mg/dL — ABNORMAL HIGH (ref 0–149)
VLDL Cholesterol Cal: 37 mg/dL (ref 5–40)

## 2018-02-25 LAB — HEPATIC FUNCTION PANEL
ALT: 29 IU/L (ref 0–44)
AST: 36 IU/L (ref 0–40)
Albumin: 4.1 g/dL (ref 3.2–4.6)
Alkaline Phosphatase: 115 IU/L (ref 39–117)
Bilirubin Total: 1.1 mg/dL (ref 0.0–1.2)
Bilirubin, Direct: 0.24 mg/dL (ref 0.00–0.40)
Total Protein: 6.9 g/dL (ref 6.0–8.5)

## 2018-02-25 LAB — BASIC METABOLIC PANEL
BUN/Creatinine Ratio: 20 (ref 10–24)
BUN: 24 mg/dL (ref 10–36)
CO2: 25 mmol/L (ref 20–29)
Calcium: 9.3 mg/dL (ref 8.6–10.2)
Chloride: 105 mmol/L (ref 96–106)
Creatinine, Ser: 1.19 mg/dL (ref 0.76–1.27)
GFR calc Af Amer: 61 mL/min/{1.73_m2} (ref >59)
GFR calc non Af Amer: 53 mL/min/{1.73_m2} — ABNORMAL LOW (ref >59)
Glucose: 75 mg/dL (ref 65–99)
Potassium: 4.7 mmol/L (ref 3.5–5.2)
Sodium: 146 mmol/L — ABNORMAL HIGH (ref 134–144)

## 2018-04-28 ENCOUNTER — Other Ambulatory Visit: Payer: Self-pay | Admitting: Nurse Practitioner

## 2018-06-23 ENCOUNTER — Encounter: Payer: Self-pay | Admitting: Nurse Practitioner

## 2018-06-23 ENCOUNTER — Ambulatory Visit (INDEPENDENT_AMBULATORY_CARE_PROVIDER_SITE_OTHER): Payer: Medicare Other | Admitting: Nurse Practitioner

## 2018-06-23 ENCOUNTER — Encounter (INDEPENDENT_AMBULATORY_CARE_PROVIDER_SITE_OTHER): Payer: Self-pay

## 2018-06-23 VITALS — BP 150/92 | HR 63 | Ht 66.0 in | Wt 191.8 lb

## 2018-06-23 DIAGNOSIS — I259 Chronic ischemic heart disease, unspecified: Secondary | ICD-10-CM

## 2018-06-23 DIAGNOSIS — Z952 Presence of prosthetic heart valve: Secondary | ICD-10-CM | POA: Diagnosis not present

## 2018-06-23 DIAGNOSIS — I38 Endocarditis, valve unspecified: Secondary | ICD-10-CM

## 2018-06-23 NOTE — Progress Notes (Signed)
CARDIOLOGY OFFICE NOTE  Date:  06/23/2018    [name] [name] Date of Birth: Jul 13, 1923 Medical Record #[id number]  PCP:  Vernie Shanks, MD  Cardiologist:  Jerel Shepherd    Chief Complaint  Patient presents with  . Coronary Artery Disease  . Cardiac Valve Problem    Follow up visit. Seen for Dr. Burt Knack    History of Present Illness: [name] [name] is a 84 y.o. male who presents today for a follow up visit. Seen for Dr. Burt Knack.Former patient of Dr. Susa Simmonds.  He has CAD with remote inferior MI, past stent to the LCX and has a totally occluded RCA. Had prior AVR by Dr. Cyndia Bent in 2003, past AAA repair in 2004, PVD, past CVA, HLD, BPH and hypothyroidism. He developed severe bioprosthetic valve aortic insufficiency with worsening heart failure and was ultimately treated with valve and valve TAVR using a 23 mm Edwards Sapian XT valve via a transfemoral approach on 03/23/2014.  I have seen him multiple times over the past few years - his wife has died. He took this pretty hard.He has had some bouts with volume overload. Last echo with more AS - reviewed with Dr. Burt Knack - managed conservatively now. General decline overall.  He hasrefused to get a Lifeline. Kids have beenpretty frustrated with him. Lots of medical issues - tried to get him back to his PCP.His medicines are never correct and unclear.   Last seen in August - was doing ok - pretty inactive. Falling. Refuses to use a walker/cane/get lifeline, etc.  Comes in today. Herewith his daughter today. Seems to be doing ok. Refuses to use a walker/cane/get lifeline as we have discussed numerous times. He has no inclination to do those type of things.  BP is up today. He did not participate in Thanksgiving with his kids - did not want to be around family - did not want to be in "small talk". But has had some of the left overs - had salt (ham). Ate this several times since the holiday. No chest pain. Breathing is ok. Some  sinus drainage at night. No falls reported.   Past Medical History:  Diagnosis Date  . AAA (abdominal aortic aneurysm) ([place])    s/p endovascular repair 2004  . Aortic insufficiency    Prosthetic valve dysfunction  . Aortic stenosis    s/p AVR in 2003, bovine  . Arthritis   . BCC (basal cell carcinoma) 09/17/2012  . BPH (benign prostatic hyperplasia)   . CAD (coronary artery disease)    stents in [place] and has a totally occluded RCA  . Chicken pox   . CVA (cerebral infarction) 12/09  . Depression   . Diverticulosis   . Glaucoma    recently told by Dr Janyth Contes he did not have it  . HTN (hypertension)   . Hyperlipemia   . Leaky heart valve   . Measles   . Mumps   . Myocardial infarction ([place])   . Personal history of poliomyelitis 09/17/2012   In childhood  . Pneumonia    hx of  . Polio   . Prosthetic valve dysfunction    Aortic stenosis and insufficiency   . Right iliac artery stenosis (HCC)   . 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 Shortness of breath    exertion, orthopnea  . Sleeping difficulties   . Stroke [facility])  no residual  . Thyroid disease     Past Surgical History:  Procedure Laterality Date  . ABDOMINAL AORTIC ANEURYSM REPAIR  2004   s/p endovascular repiar of AAA in 2004  . AORTIC VALVE REPLACEMENT  2003   #57mm pericardial valve   . APPENDECTOMY    . CARDIAC CATHETERIZATION    . CORONARY ANGIOPLASTY    . CORONARY ARTERY BYPASS GRAFT    . CORONARY STENT PLACEMENT    . INTRAOPERATIVE TRANSESOPHAGEAL ECHOCARDIOGRAM N/A 03/23/2014   Procedure: INTRAOPERATIVE TRANSESOPHAGEAL ECHOCARDIOGRAM;  Surgeon: Sherren Mocha, MD;  Location: [facility] OR;  Service: Open Heart Surgery;  Laterality: N/A;  . JOINT REPLACEMENT    . LEFT AND RIGHT HEART CATHETERIZATION WITH CORONARY ANGIOGRAM N/A 02/08/2014   Procedure: LEFT AND RIGHT HEART CATHETERIZATION WITH  CORONARY ANGIOGRAM;  Surgeon: Blane Ohara, MD;  Location: [facility] CATH LAB;  Service: Cardiovascular;  Laterality: N/A;  . TEE WITHOUT CARDIOVERSION N/A 09/02/2013   Procedure: TRANSESOPHAGEAL ECHOCARDIOGRAM (TEE);  Surgeon: Dorothy Spark, MD;  Location: [place];  Service: Cardiovascular;  Laterality: N/A;  . TONSILLECTOMY    . TRANSCATHETER AORTIC VALVE REPLACEMENT, TRANSFEMORAL N/A 03/23/2014   Procedure: TRANSCATHETER AORTIC VALVE REPLACEMENT, TRANSFEMORAL;  Surgeon: Sherren Mocha, MD;  Location: [place];  Service: Open Heart Surgery;  Laterality: N/A;  valve in valve TAVR  . WISDOM TOOTH EXTRACTION       Medications: Current Meds  Medication Sig  . acetaminophen (TYLENOL) 500 MG tablet Take 1,000 mg by mouth every 4 (four) hours as needed.  Marland Kitchen aspirin 81 MG tablet Take 81 mg by mouth daily.  Marland Kitchen atorvastatin (LIPITOR) 80 MG tablet TAKE 1 TABLET BY MOUTH  DAILY  . B Complex-C (SUPER B COMPLEX) TABS Take 1 tablet by mouth daily.  . clindamycin (CLEOCIN) 150 MG capsule TK 4 CS PO PRIOR TO DENTAL APPOINTMENT  . finasteride (PROSCAR) 5 MG tablet TAKE 1 TABLET BY MOUTH  DAILY  . furosemide (LASIX) 40 MG tablet Take 0.5 tablets (20 mg total) by mouth daily.  Marland Kitchen levothyroxine (SYNTHROID, LEVOTHROID) 50 MCG tablet Take 1 tablet (50 mcg total) by mouth daily.  Marland Kitchen losartan (COZAAR) 100 MG tablet TAKE 1 TABLET BY MOUTH  DAILY  . metoprolol tartrate (LOPRESSOR) 25 MG tablet Take 1 tablet (25 mg total) by mouth 2 (two) times daily.  . Multiple Vitamins-Minerals (PRESERVISION AREDS 2 PO) Take 1 capsule by mouth 2 (two) times daily.   Marland Kitchen PARoxetine (PAXIL) 20 MG tablet Take 0.5 tablets (10 mg total) by mouth every morning.  . potassium chloride (K-DUR) 10 MEQ tablet Take 1 tablet (10 mEq total) by mouth daily.     Allergies: Allergies  Allergen Reactions  . Mucinex [Guaifenesin Er]     Tongue swelling  . Amoxicillin-Pot Clavulanate Itching  . Morphine And Related Nausea And Vomiting  .  Penicillins Hives    Social History: The patient  reports that he quit smoking about 37 years ago. His smoking use included cigarettes. He has a 15.00 pack-year smoking history. He has never used smokeless tobacco. He reports that he drinks about 7.0 standard drinks of alcohol per week. He reports that he does not use drugs.   Family History: The patient's family history includes Aneurysm in his father; Cancer in his brother, daughter, and mother; Colon cancer in his mother; Heart attack in his brother; Heart disease in his brother; Prostate cancer in his brother.   Review of Systems: Please see the history of present illness.  Otherwise, the review of systems is positive for none.   All other systems are reviewed and negative.   Physical Exam: VS:  BP (!) 150/92 (BP Location: Left Arm, Patient Position: Sitting, Cuff Size: Normal)   Pulse 63   Ht 5\' 6"  (1.676 m)   Wt 191 lb 12.8 oz (87 kg)   SpO2 94% Comment: at rest  BMI 30.96 kg/m  .  BMI Body mass index is 30.96 kg/m.  Wt Readings from Last 3 Encounters:  06/23/18 191 lb 12.8 oz (87 kg)  02/24/18 190 lb 12.8 oz (86.5 kg)  11/13/17 191 lb 12.8 oz (87 kg)   BP recheck by me is 140/82  General: Elderly. Alert and in no acute distress.   HEENT: Normal.  Neck: Supple, no JVD, carotid bruits, or masses noted.  Cardiac: Regular rate and rhythm. Outflow murmur - does not sound as harsh today as in the past. No edema.  Respiratory:  Lungs are clear to auscultation bilaterally with normal work of breathing.  GI: Soft and nontender.  MS: No deformity or atrophy. Gait and ROM intact.  Skin: Warm and dry. Color is normal.  Neuro:  Strength and sensation are intact and no gross focal deficits noted.  Psych: Alert, appropriate and with normal affect.   LABORATORY DATA:  EKG:  EKG is not ordered today.  Lab Results  Component Value Date   WBC 7.6 07/24/2017   HGB 14.4 07/24/2017   HCT 41.6 07/24/2017   PLT 149 (L) 07/24/2017    GLUCOSE 75 02/24/2018   CHOL 187 02/24/2018   TRIG 187 (H) 02/24/2018   HDL 38 (L) 02/24/2018   LDLCALC 112 (H) 02/24/2018   ALT 29 02/24/2018   AST 36 02/24/2018   NA 146 (H) 02/24/2018   K 4.7 02/24/2018   CL 105 02/24/2018   CREATININE 1.19 02/24/2018   BUN 24 02/24/2018   CO2 25 02/24/2018   TSH 4.090 09/10/2016   INR 1.25 03/23/2014   HGBA1C 6.3 (H) 03/19/2014     BNP (last 3 results) No results for input(s): BNP in the last 8760 hours.  ProBNP (last 3 results) Recent Labs    07/24/17 1201  PROBNP 516*     Other Studies Reviewed Today:  ECHO Study Conclusions 10/2016  - Left ventricle: The cavity size was normal. Wall thickness was increased in a pattern of moderate LVH. Systolic function was normal. The estimated ejection fraction was in the range of 50% to 55%. Wall motion was normal; there were no regional wall motion abnormalities. Doppler parameters are consistent with abnormal left ventricular relaxation (grade 1 diastolic dysfunction). Doppler parameters are consistent with high ventricular filling pressure. - Aortic valve: A transcatheter aortic valve bioprosthesis was present. There was severe stenosis. Peak velocity (S): 413 cm/s. Mean gradient (S): 41 mm Hg. Valve area (VTI): 0.59 cm^2. Valve area (Vmax): 0.53 cm^2. Valve area (Vmean): 0.57 cm^2. - Mitral valve: Transvalvular velocity was within the normal range. There was no evidence for stenosis. There was mild regurgitation. - Right ventricle: The cavity size was normal. Wall thickness was normal. Systolic function was normal. - Atrial septum: No defect or patent foramen ovale was identified by color flow Doppler. - Tricuspid valve: There was trivial regurgitation. - Pulmonary arteries: Systolic pressure was within the normal range. PA peak pressure: 15 mm Hg (S).  Impressions:  - There is a TAVR bioprosthesis inside a bioprosthetic aortic valve. Since the  echo 10/27/15, the mean gradient of the TAVR  has increased from 35 mmHg to 41 mmHg, conistent with severe stenosis.  Assessment/Plan:  1. Aortic valve disease s/p valve-in-valve TAVR: Pt with NYHA II/II symptoms of exertional dyspnea. Hisechofrom April of 2018with worsening AS - no further interventions are warranted - he has been managed medically. Seems to be holding his own. No changes made today.   2. HTN - BP is up - this is unusual for him - I suspect it is due to recent excess salt. Would monitor  3. CAD - no active chest pain - continue with conservative management.   4. Advancing age - holding his own.   Current medicines are reviewed with the patient today.  The patient does not have concerns regarding medicines other than what has been noted above.  The following changes have been made:  See above.  Labs/ tests ordered today include:   No orders of the defined types were placed in this encounter.    Disposition:   FU with me in 4 months.   Patient is agreeable to this plan and will call if any problems develop in the interim.   SignedTruitt Merle, NP  06/23/2018 2:08 PM  [facility] [address] [place], [place]  [address] Phone: [phone] Fax: [phone]

## 2018-06-23 NOTE — Patient Instructions (Addendum)
We will be checking the following labs today - NONE  Medication Instructions:    Continue with your current medicines.    If you need a refill on your cardiac medications before your next appointment, please call your pharmacy.     Testing/Procedures To Be Arranged:  N/A  Follow-Up:   See me in 4 months.    At [facility], you and your health needs are our priority.  As part of our continuing mission to provide you with exceptional heart care, we have created designated Provider Care Teams.  These Care Teams include your primary Cardiologist (physician) and Advanced Practice Providers (APPs -  Physician Assistants and Nurse Practitioners) who all work together to provide you with the care you need, when you need it.  Special Instructions:  . None  Call the [facility] office at [phone] if you have any questions, problems or concerns.       

## 2018-07-17 ENCOUNTER — Other Ambulatory Visit: Payer: Self-pay | Admitting: Nurse Practitioner

## 2018-09-23 ENCOUNTER — Other Ambulatory Visit: Payer: Self-pay

## 2018-09-23 DIAGNOSIS — Z95828 Presence of other vascular implants and grafts: Secondary | ICD-10-CM

## 2018-09-23 DIAGNOSIS — I714 Abdominal aortic aneurysm, without rupture, unspecified: Secondary | ICD-10-CM

## 2018-09-30 ENCOUNTER — Encounter: Payer: Self-pay | Admitting: Family

## 2018-09-30 ENCOUNTER — Other Ambulatory Visit: Payer: Self-pay

## 2018-09-30 ENCOUNTER — Ambulatory Visit (HOSPITAL_COMMUNITY)
Admission: RE | Admit: 2018-09-30 | Discharge: 2018-09-30 | Disposition: A | Discharge status: Home / Self Care | Payer: Medicare Other | Source: Ambulatory Visit | Attending: Vascular Surgery | Admitting: Vascular Surgery

## 2018-09-30 ENCOUNTER — Ambulatory Visit (INDEPENDENT_AMBULATORY_CARE_PROVIDER_SITE_OTHER): Payer: Medicare Other | Admitting: Family

## 2018-09-30 VITALS — BP 133/70 | HR 64 | Temp 97.1°F | Resp 18 | Ht 66.0 in | Wt 190.0 lb

## 2018-09-30 DIAGNOSIS — I714 Abdominal aortic aneurysm, without rupture, unspecified: Secondary | ICD-10-CM

## 2018-09-30 DIAGNOSIS — Z95828 Presence of other vascular implants and grafts: Secondary | ICD-10-CM

## 2018-09-30 NOTE — Progress Notes (Signed)
[facility] [place]  CC: Follow up s/p Endovascular Repair of Abdominal Aortic Aneurysm    History of Present Illness  [name] [name] is a 85 y.o. (1923/03/27) male who is status post endovascular repair of abdominal aortic aneurysm by Dr. Amedeo Plenty in 2004 using a AneuRx stent graft. He denies abdominal pain.  Dr. Trula Slade last saw pt on 04/06/13. At that time the maximum excluded sac diameter was 3.4 cm which was different from the prior study and more consistent with his previous studies The patient's aneurysm continued to decrease in size. Dr. Trula Slade suspected the study 6 months prior was inaccurate.  He had a repeat aortic valve replacement in 2015. He takes no anticoagulants. He does take a daily 81 mg ASA.  He takes Lipitor and states "Im sure that affects my muscles, I can get cramps in my muscles anywhere."  The patient denies claudication in legs with walking. The patient reports history of stroke about 2006, denies residual speech difficulties, deneis hemiparesis, denies monocular loss of vision.  His wife died in 14-Dec-2015, had Alzheimer's Disease, while in a nursing home.   He reports intermittent dyspnea, states it is not getting worse.   He reports chronic low back pain, states he has discussed this with his PCP, is considering seeing a spine surgeon. States he was told that in the past he fractured his coccyx.    Diabetic: No Tobaccos use: former smoker, quit about 1982   Past Medical History:  Diagnosis Date  . AAA (abdominal aortic aneurysm) ([place])    s/p endovascular repair 2004  . Aortic insufficiency    Prosthetic valve dysfunction  . Aortic stenosis    s/p AVR in 2003, bovine  . Arthritis   . BCC (basal cell carcinoma) 09/17/2012  . BPH (benign prostatic hyperplasia)   . CAD (coronary artery disease)    stents in [place] and has a totally occluded RCA  . Chicken pox   . CVA (cerebral infarction) 12/09  . Depression   . Diverticulosis    . Glaucoma    recently told by Dr Janyth Contes he did not have it  . HTN (hypertension)   . Hyperlipemia   . Leaky heart valve   . Measles   . Mumps   . Myocardial infarction ([place])   . Personal history of poliomyelitis 09/17/2012   In childhood  . Pneumonia    hx of  . Polio   . Prosthetic valve dysfunction    Aortic stenosis and insufficiency   . Right iliac artery stenosis (HCC)   . 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 Shortness of breath    exertion, orthopnea  . Sleeping difficulties   . Stroke [facility])    no residual  . Thyroid disease    Past Surgical History:  Procedure Laterality Date  . ABDOMINAL AORTIC ANEURYSM REPAIR  2004   s/p endovascular repiar of AAA in 2004  . AORTIC VALVE REPLACEMENT  2003   #16mm pericardial valve   . APPENDECTOMY    . CARDIAC CATHETERIZATION    . CORONARY ANGIOPLASTY    . CORONARY ARTERY BYPASS GRAFT    . CORONARY STENT PLACEMENT    . INTRAOPERATIVE TRANSESOPHAGEAL ECHOCARDIOGRAM N/A 03/23/2014   Procedure: INTRAOPERATIVE TRANSESOPHAGEAL ECHOCARDIOGRAM;  Surgeon: Sherren Mocha, MD;  Location: [facility] OR;  Service: Open Heart Surgery;  Laterality: N/A;  . JOINT REPLACEMENT    .  LEFT AND RIGHT HEART CATHETERIZATION WITH CORONARY ANGIOGRAM N/A 02/08/2014   Procedure: LEFT AND RIGHT HEART CATHETERIZATION WITH CORONARY ANGIOGRAM;  Surgeon: Blane Ohara, MD;  Location: [facility] CATH LAB;  Service: Cardiovascular;  Laterality: N/A;  . TEE WITHOUT CARDIOVERSION N/A 09/02/2013   Procedure: TRANSESOPHAGEAL ECHOCARDIOGRAM (TEE);  Surgeon: Dorothy Spark, MD;  Location: [place];  Service: Cardiovascular;  Laterality: N/A;  . TONSILLECTOMY    . TRANSCATHETER AORTIC VALVE REPLACEMENT, TRANSFEMORAL N/A 03/23/2014   Procedure: TRANSCATHETER AORTIC VALVE REPLACEMENT, TRANSFEMORAL;  Surgeon: Sherren Mocha, MD;  Location: [place];  Service: Open  Heart Surgery;  Laterality: N/A;  valve in valve TAVR  . WISDOM TOOTH EXTRACTION     Social History Social History   Tobacco Use  . Smoking status: Former Smoker    Packs/day: 0.50    Years: 30.00    Pack years: 15.00    Types: Cigarettes    Last attempt to quit: 10/04/1980    Years since quitting: 38.0  . Smokeless tobacco: Never Used  Substance Use Topics  . Alcohol use: Yes    Alcohol/week: 7.0 standard drinks    Types: 7 Shots of liquor per week    Comment: cocktails/night  . Drug use: No   Family History Family History  Problem Relation Age of Onset  . Colon cancer Mother   . Cancer Mother   . Aneurysm Father   . Heart attack Brother        CHF  . Prostate cancer Brother   . Cancer Brother   . Heart disease Brother   . Cancer Daughter    Current Outpatient Medications on File Prior to Visit  Medication Sig Dispense Refill  . acetaminophen (TYLENOL) 500 MG tablet Take 1,000 mg by mouth every 4 (four) hours as needed.    Marland Kitchen aspirin 81 MG tablet Take 81 mg by mouth daily.    Marland Kitchen atorvastatin (LIPITOR) 80 MG tablet TAKE 1 TABLET BY MOUTH  DAILY 90 tablet 3  . B Complex-C (SUPER B COMPLEX) TABS Take 1 tablet by mouth daily.    . clindamycin (CLEOCIN) 150 MG capsule TK 4 CS PO PRIOR TO DENTAL APPOINTMENT  0  . finasteride (PROSCAR) 5 MG tablet TAKE 1 TABLET BY MOUTH  DAILY 90 tablet 3  . furosemide (LASIX) 40 MG tablet Take 0.5 tablets (20 mg total) by mouth daily. 45 tablet 2  . levothyroxine (SYNTHROID, LEVOTHROID) 50 MCG tablet Take 1 tablet (50 mcg total) by mouth daily. 90 tablet 3  . losartan (COZAAR) 100 MG tablet TAKE 1 TABLET BY MOUTH  DAILY 90 tablet 3  . metoprolol tartrate (LOPRESSOR) 25 MG tablet Take 1 tablet (25 mg total) by mouth 2 (two) times daily. 180 tablet 2  . Multiple Vitamins-Minerals (PRESERVISION AREDS 2 PO) Take 1 capsule by mouth 2 (two) times daily.     Marland Kitchen PARoxetine (PAXIL) 20 MG tablet Take 0.5 tablets (10 mg total) by mouth every morning. 45  tablet 3  . potassium chloride (K-DUR) 10 MEQ tablet Take 1 tablet (10 mEq total) by mouth daily. 90 tablet 2   No current facility-administered medications on file prior to visit.    Allergies  Allergen Reactions  . Mucinex [Guaifenesin Er]     Tongue swelling  . Amoxicillin-Pot Clavulanate Itching  . Morphine And Related Nausea And Vomiting  . Penicillins Hives     ROS: See HPI for pertinent positives and negatives.  Physical Examination  Vitals:   09/30/18 1113  BP: 133/70  Pulse: 64  Resp: 18  Temp: (!) 97.1 F (36.2 C)  SpO2: 93%  Weight: 190 lb 0.6 oz (86.2 kg)  Height: 5\' 6"  (1.676 m)   Body mass index is 30.67 kg/m.  General: A&O x 3, WD, obese elderly male, appears younger than stated age 44: No gross abnormalities  Pulmonary: Sym exp, respirations are non labored, good air movement in all fields CTAB, no rales, rhonchi, or wheezes. Cardiac: Regular rhythm and rate with occasional premature contractions, + murmur.  Vascular: Vessel Right Left  Radial Palpable Palpable  Brachial Palpable Palpable  Carotid  with bruit vs transmitted cardiac murmur  with bruit  Aorta Not palpable N/A  Femoral Palpable Palpable  Popliteal Not palpable Not palpable  PT Faintly Palpable Faintly Palpable  DP Faintly Palpable Faintly Palpable   Gastrointestinal: soft, NTND, -G/R, - HSM, - palpable masses, - CVAT B. Musculoskeletal: M/S 5/5 in upper extremities, 4/4 in LE's, extremities without ischemic changes. Skin: No rashes, no ulcers, no cellulitis.   Neurologic: Pain and light touch intact in extremities, Motor exam as listed above. CN 2-12 grossly intact except has some hearing loss. Psychiatric: Normal thought content, mood appropriate for clinical situation.    DATA  EVAR Duplex   Current (Date: 09-30-18) Endovascular Aortic Repair (EVAR): +----------+----------------+-------------------+-------------------+           Diameter AP (cm)Diameter Trans  (cm)Velocities (cm/sec) +----------+----------------+-------------------+-------------------+ Aorta     3.26            3.41               54                  +----------+----------------+-------------------+-------------------+ Right Limb1.52                               102                 +----------+----------------+-------------------+-------------------+ Left Limb 1.52            1.61               75                  +----------+----------------+-------------------+-------------------+  Summary: Abdominal Aorta: Patent endovascular aneurysm repair with no evidence of endoleak. Technically difficult exam due to large amounts of overlying bowel gas.  Stable size compared to the exam on 05-22-17, based on limited visualization.   Previous EVAR Duplex (Date: 05/22/17):  AAA sac size: 3.5 cm x 3.7 cm; Right CIA: 1.56 cm; Left CIA: 1.37 cm  no endoleak detected  Previous (04-16-16): 3.5 cm x 3.5 cm   02/18/14 CTA abd/pelvis requested by Dr. Sherren Mocha:  Extensive atherosclerosis throughout the abdominal and pelvic vasculature, including vascular findings and measurements pertinent to potential TAVR procedure, as detailed below. Post procedural changes of aortobi-iliac endograft are noted, originating immediately beneath the level the renal artery origins extending into the mid common iliac arteries bilaterally. The aortic and iliac portions of the graft are all widely patent, without evidence of in stent restenosis. The excluded aneurysm sac currently measures 3.7 x 4.0 cm. No high attenuation is identified within the excluded aneurysm sac to suggest presence of endoleak on today's arterial phase examination.   Carotid Duplex (05/22/17): <40% bilateral ICA stenosis. Left ECA with >50% stenosis. Bilateral vertebral artery flow is antegrade.  Bilateral subclavian artery waveforms are normal.  No change  compared to the exam on 03-19-14 at [facility].     Medical Decision Making  [name] [name] is a 85 y.o. male who presents s/p EVAR (Date: 2004).  Pt is asymptomatic with stable sac size at 3.41 cm, based on limited visualization.   I discussed with the patient the importance of surveillance of the endograft.  The next endograft duplex will be scheduled for 12 months.  The patient will follow up with [place] in 12 months with these studies.  No need to recheck carotid duplex.   I emphasized the importance of maximal medical management including strict control of blood pressure, blood glucose, and lipid levels, antiplatelet agents, obtaining regular exercise, and cessation of smoking.   Thank you for allowing [place] to participate in this patient's care.  Clemon Chambers, RN, MSN, FNP-C [facility] [place] Office: [phone]  Clinic Physician: Bishop Dublin  09/30/2018, 11:29 AM

## 2018-09-30 NOTE — Patient Instructions (Signed)
Before your next abdominal ultrasound:  Avoid gas forming foods and beverages the day before the test.   Take two Extra-Strength Gas-X capsules at bedtime the night before the test. Take another two Extra-Strength Gas-X capsules in the middle of the night if you get up to the restroom, if not, first thing in the morning with water.  Do not chew gum.     

## 2018-10-22 ENCOUNTER — Telehealth: Payer: Self-pay | Admitting: *Deleted

## 2018-10-22 NOTE — Telephone Encounter (Signed)
S/w pt for upcoming phone visit. Pt is doing well with no concerns.  Pt has cancelled all upcoming doctor's appts. Pt's weight today is 190 lb.  Pt did take bp while on phone 117/68  HR 63.  Went over pt's medications, no refills needed at this time. Will send to [facility].

## 2018-10-26 NOTE — Progress Notes (Signed)
Telehealth Visit     Virtual Visit via Telephone Note   This visit type was conducted due to national recommendations for restrictions regarding the COVID-19 Pandemic (e.g. social distancing) in an effort to limit this patient's exposure and mitigate transmission in our community.  Due to his co-morbid illnesses, this patient is at least at moderate risk for complications without adequate follow up.  This format is felt to be most appropriate for this patient at this time.  The patient did not have access to video technology/had technical difficulties with video requiring transitioning to audio format only (telephone).  All issues noted in this document were discussed and addressed.  No physical exam could be performed with this format.  Please refer to the patient's chart for his  consent to telehealth for [facility].   Evaluation Performed:  Follow-up visit  This visit type was conducted due to national recommendations for restrictions regarding the COVID-19 Pandemic (e.g. social distancing).  This format is felt to be most appropriate for this patient at this time.  All issues noted in this document were discussed and addressed.  No physical exam was performed (except for noted visual exam findings with Video Visits).  Please refer to the patient's chart (MyChart message for video visits and phone note for telephone visits) for the patient's consent to telehealth for [facility].  Date:  10/27/2018   ID:  [name] [name], DOB [birth year], MRN [id number]  Patient Location:  Home  Provider location:   Home  PCP:  Vernie Shanks, MD  Cardiologist:  Servando Snare & No primary care provider on file.  Electrophysiologist:  None   Chief Complaint:  Follow up visit.   History of Present Illness:    [name] [name] is a 85 y.o. male who presents via audio/video conferencing for a telehealth visit today.  Seen for Dr. Burt Knack.Former patient of Dr. Susa Simmonds.   He has CAD with remote inferior  MI, past stent to the LCX and has a totally occluded RCA. Had prior AVR by Dr. Cyndia Bent in 2003, past AAA repair in 2004, PVD, past CVA, HLD, BPH and hypothyroidism. He developed severe bioprosthetic valve aortic insufficiency with worsening heart failure and was ultimately treated with valve and valve TAVR using a 23 mm Edwards Sapian XT valve via a transfemoral approach on 03/23/2014.  I have seen him multiple times over the past few years -hiswifehasdied. He took this pretty hard.He has had some bouts with volume overload. Last echo with more AS - reviewed with Dr. Burt Knack - managed conservatively now. General decline overall. He hasrefused to get a Lifeline. Kids have beenpretty frustrated with him. Lots of medical issues - tried to get him back to his PCP.His medicines are never correct and unclear. He has had some falls - refuses to get a cane/walker or lifeline.   Last seen in December - he was doing ok and holding his own.   The patient does not have symptoms concerning for COVID-19 infection (fever, chills, cough, or new shortness of breath).   Seen today via phone conversation. He has consented. He has done ok for the most part - has had diarrhea today but otherwise is ok. He is staying in. Breathing has been ok for the most part. Does get stopped up at night but this is chronic. He was wanting to see Dr. Jacelyn Grip - but has been put on hold til June. No chest pain. Feels "pretty durn good".  No chest pain. More limited by his  back. No swelling.   Past Medical History:  Diagnosis Date  . AAA (abdominal aortic aneurysm) ([place])    s/p endovascular repair 2004  . Aortic insufficiency    Prosthetic valve dysfunction  . Aortic stenosis    s/p AVR in 2003, bovine  . Arthritis   . BCC (basal cell carcinoma) 09/17/2012  . BPH (benign prostatic hyperplasia)   . CAD (coronary artery disease)    stents in [place] and has a totally occluded RCA  . Chicken pox   . CVA (cerebral infarction) 12/09  .  Depression   . Diverticulosis   . Glaucoma    recently told by Dr Janyth Contes he did not have it  . HTN (hypertension)   . Hyperlipemia   . Leaky heart valve   . Measles   . Mumps   . Myocardial infarction ([place])   . Personal history of poliomyelitis 09/17/2012   In childhood  . Pneumonia    hx of  . Polio   . Prosthetic valve dysfunction    Aortic stenosis and insufficiency   . Right iliac artery stenosis (HCC)   . 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 Shortness of breath    exertion, orthopnea  . Sleeping difficulties   . Stroke [facility])    no residual  . Thyroid disease    Past Surgical History:  Procedure Laterality Date  . ABDOMINAL AORTIC ANEURYSM REPAIR  2004   s/p endovascular repiar of AAA in 2004  . AORTIC VALVE REPLACEMENT  2003   #25mm pericardial valve   . APPENDECTOMY    . CARDIAC CATHETERIZATION    . CORONARY ANGIOPLASTY    . CORONARY ARTERY BYPASS GRAFT    . CORONARY STENT PLACEMENT    . INTRAOPERATIVE TRANSESOPHAGEAL ECHOCARDIOGRAM N/A 03/23/2014   Procedure: INTRAOPERATIVE TRANSESOPHAGEAL ECHOCARDIOGRAM;  Surgeon: Sherren Mocha, MD;  Location: [facility] OR;  Service: Open Heart Surgery;  Laterality: N/A;  . JOINT REPLACEMENT    . LEFT AND RIGHT HEART CATHETERIZATION WITH CORONARY ANGIOGRAM N/A 02/08/2014   Procedure: LEFT AND RIGHT HEART CATHETERIZATION WITH CORONARY ANGIOGRAM;  Surgeon: Blane Ohara, MD;  Location: [facility] CATH LAB;  Service: Cardiovascular;  Laterality: N/A;  . TEE WITHOUT CARDIOVERSION N/A 09/02/2013   Procedure: TRANSESOPHAGEAL ECHOCARDIOGRAM (TEE);  Surgeon: Dorothy Spark, MD;  Location: [place];  Service: Cardiovascular;  Laterality: N/A;  . TONSILLECTOMY    . TRANSCATHETER AORTIC VALVE REPLACEMENT, TRANSFEMORAL N/A 03/23/2014   Procedure: TRANSCATHETER AORTIC VALVE REPLACEMENT, TRANSFEMORAL;  Surgeon: Sherren Mocha, MD;   Location: [place];  Service: Open Heart Surgery;  Laterality: N/A;  valve in valve TAVR  . WISDOM TOOTH EXTRACTION       Current Meds  Medication Sig  . acetaminophen (TYLENOL) 500 MG tablet Take 1,000 mg by mouth every 4 (four) hours as needed.  Marland Kitchen aspirin 81 MG tablet Take 81 mg by mouth daily.  Marland Kitchen atorvastatin (LIPITOR) 80 MG tablet TAKE 1 TABLET BY MOUTH  DAILY  . B Complex-C (SUPER B COMPLEX) TABS Take 1 tablet by mouth daily.  . clindamycin (CLEOCIN) 150 MG capsule TK 4 CS PO PRIOR TO DENTAL APPOINTMENT  . finasteride (PROSCAR) 5 MG tablet TAKE 1 TABLET BY MOUTH  DAILY  . furosemide (LASIX) 40 MG tablet Take 0.5 tablets (20 mg total) by mouth daily.  Marland Kitchen levothyroxine (SYNTHROID, LEVOTHROID) 50 MCG tablet Take 1 tablet (50 mcg total) by  mouth daily.  Marland Kitchen losartan (COZAAR) 100 MG tablet TAKE 1 TABLET BY MOUTH  DAILY  . metoprolol tartrate (LOPRESSOR) 25 MG tablet Take 1 tablet (25 mg total) by mouth 2 (two) times daily.  . Multiple Vitamins-Minerals (PRESERVISION AREDS 2 PO) Take 1 capsule by mouth 2 (two) times daily.   Marland Kitchen PARoxetine (PAXIL) 20 MG tablet Take 0.5 tablets (10 mg total) by mouth every morning.  . potassium chloride (K-DUR) 10 MEQ tablet Take 1 tablet (10 mEq total) by mouth daily.     Allergies:   Mucinex [guaifenesin er]; Amoxicillin-pot clavulanate; Morphine and related; and Penicillins   Social History   Tobacco Use  . Smoking status: Former Smoker    Packs/day: 0.50    Years: 30.00    Pack years: 15.00    Types: Cigarettes    Last attempt to quit: 10/04/1980    Years since quitting: 38.0  . Smokeless tobacco: Never Used  Substance Use Topics  . Alcohol use: Yes    Alcohol/week: 7.0 standard drinks    Types: 7 Shots of liquor per week    Comment: cocktails/night  . Drug use: No     Family Hx: The patient's family history includes Aneurysm in his father; Cancer in his brother, daughter, and mother; Colon cancer in his mother; Heart attack in his brother; Heart  disease in his brother; Prostate cancer in his brother.  ROS:   Please see the history of present illness.   All other systems reviewed are negative except for none.    Objective:    Vital Signs:  BP 117/68   Pulse 63   Wt 190 lb (86.2 kg)   BMI 30.67 kg/m    Wt Readings from Last 3 Encounters:  10/27/18 190 lb (86.2 kg)  09/30/18 190 lb 0.6 oz (86.2 kg)  06/23/18 191 lb 12.8 oz (87 kg)    Alert. In no acute distress. Not short of breath with talking.    Labs/Other Tests and Data Reviewed:    Lab Results  Component Value Date   WBC 7.6 07/24/2017   HGB 14.4 07/24/2017   HCT 41.6 07/24/2017   PLT 149 (L) 07/24/2017   GLUCOSE 75 02/24/2018   CHOL 187 02/24/2018   TRIG 187 (H) 02/24/2018   HDL 38 (L) 02/24/2018   LDLCALC 112 (H) 02/24/2018   ALT 29 02/24/2018   AST 36 02/24/2018   NA 146 (H) 02/24/2018   K 4.7 02/24/2018   CL 105 02/24/2018   CREATININE 1.19 02/24/2018   BUN 24 02/24/2018   CO2 25 02/24/2018   TSH 4.090 09/10/2016   INR 1.25 03/23/2014   HGBA1C 6.3 (H) 03/19/2014     BNP (last 3 results) No results for input(s): BNP in the last 8760 hours.  ProBNP (last 3 results) No results for input(s): PROBNP in the last 8760 hours.    Prior CV studies:    The following studies were reviewed today:  ECHO Study Conclusions 10/2016  - Left ventricle: The cavity size was normal. Wall thickness was increased in a pattern of moderate LVH. Systolic function was normal. The estimated ejection fraction was in the range of 50% to 55%. Wall motion was normal; there were no regional wall motion abnormalities. Doppler parameters are consistent with abnormal left ventricular relaxation (grade 1 diastolic dysfunction). Doppler parameters are consistent with high ventricular filling pressure. - Aortic valve: A transcatheter aortic valve bioprosthesis was present. There was severe stenosis. Peak velocity (S): 413 cm/s. Mean gradient (  S): 41  mm Hg. Valve area (VTI): 0.59 cm^2. Valve area (Vmax): 0.53 cm^2. Valve area (Vmean): 0.57 cm^2. - Mitral valve: Transvalvular velocity was within the normal range. There was no evidence for stenosis. There was mild regurgitation. - Right ventricle: The cavity size was normal. Wall thickness was normal. Systolic function was normal. - Atrial septum: No defect or patent foramen ovale was identified by color flow Doppler. - Tricuspid valve: There was trivial regurgitation. - Pulmonary arteries: Systolic pressure was within the normal range. PA peak pressure: 15 mm Hg (S).  Impressions:  - There is a TAVR bioprosthesis inside a bioprosthetic aortic valve. Since the echo 10/27/15, the mean gradient of the TAVR has increased from 35 mmHg to 41 mmHg, conistent with severe stenosis.    ASSESSMENT & PLAN:    1. Aortic valve disease s/p valve-in-valve TAVR: Pt with NYHA II/II symptoms of exertional dyspnea. [place] April of 2018with worsening AS - no further interventions have been warranted - he has been managed medically. Seems to be holding his own from our conversation today. No changes made today.   2. HTN - BP ok - no changes made today.   3. CAD - no active chest pain - continue with conservative management.   4. Advancing age - holding his own. Seems more limited by his back pain at this time.   5. COVID-19 Education: The signs and symptoms of COVID-19 were discussed with the patient and how to seek care for testing (follow up with PCP or arrange E-visit).  The importance of social distancing, staying at home and hand hygiene were discussed today.  Patient Risk:   After full review of this patient's clinical status, I feel that they are at least moderate risk at this time.  Time:   Today, I have spent 10 minutes with the patient with telehealth technology discussing the above issues.     Medication Adjustments/Labs and Tests Ordered: Current  medicines are reviewed at length with the patient today.  Concerns regarding medicines are outlined above.   Tests Ordered: No orders of the defined types were placed in this encounter.   Medication Changes: No orders of the defined types were placed in this encounter.   Disposition:  FU with me in 4 months.   Patient is agreeable to this plan and will call if any problems develop in the interim.   Amie Critchley, NP  10/27/2018 1:29 PM    [facility]

## 2018-10-27 ENCOUNTER — Other Ambulatory Visit: Payer: Self-pay

## 2018-10-27 ENCOUNTER — Ambulatory Visit: Payer: Medicare Other | Admitting: Nurse Practitioner

## 2018-10-27 ENCOUNTER — Telehealth: Payer: Self-pay | Admitting: Nurse Practitioner

## 2018-10-27 ENCOUNTER — Telehealth (INDEPENDENT_AMBULATORY_CARE_PROVIDER_SITE_OTHER): Payer: Medicare Other | Admitting: Nurse Practitioner

## 2018-10-27 VITALS — BP 117/68 | HR 63 | Wt 190.0 lb

## 2018-10-27 DIAGNOSIS — Z952 Presence of prosthetic heart valve: Secondary | ICD-10-CM | POA: Diagnosis not present

## 2018-10-27 DIAGNOSIS — I5032 Chronic diastolic (congestive) heart failure: Secondary | ICD-10-CM

## 2018-10-27 DIAGNOSIS — I10 Essential (primary) hypertension: Secondary | ICD-10-CM

## 2018-10-27 DIAGNOSIS — I259 Chronic ischemic heart disease, unspecified: Secondary | ICD-10-CM

## 2018-10-27 DIAGNOSIS — E7800 Pure hypercholesterolemia, unspecified: Secondary | ICD-10-CM

## 2018-10-27 NOTE — Telephone Encounter (Signed)
Pt called back returning Lori's call. He said he could not get to the phone in time. He is available now

## 2018-10-27 NOTE — Patient Instructions (Signed)
After Visit Summary:  We will be checking the following labs today - NONE  Medication Instructions:    Continue with your current medicines.    If you need a refill on your cardiac medications before your next appointment, please call your pharmacy.     Testing/Procedures To Be Arranged:  N/A  Follow-Up:   See me in about 4 months.     At [facility], you and your health needs are our priority.  As part of our continuing mission to provide you with exceptional heart care, we have created designated Provider Care Teams.  These Care Teams include your primary Cardiologist (physician) and Advanced Practice Providers (APPs -  Physician Assistants and Nurse Practitioners) who all work together to provide you with the care you need, when you need it.  Special Instructions:  . Stay safe, stay home and wash your hands for at least 20 seconds!  Call the [place] office at [phone] if you have any questions, problems or concerns.

## 2018-12-04 ENCOUNTER — Other Ambulatory Visit: Payer: Self-pay | Admitting: Cardiovascular Disease

## 2019-01-13 ENCOUNTER — Telehealth: Payer: Self-pay | Admitting: Nurse Practitioner

## 2019-01-13 NOTE — Telephone Encounter (Signed)
S/w daughter, [name] per (DPR).  Pt is having Left ankle swelling to thigh, right foot swelling.  Pt stumbled on Sunday but was held up and let down easy.  Pt does not want to eat.  Pt is SOB.  Pt is ill due to COVID and suffocates with mask. Pt cannot hear.  Pt will come in for office visit tomorrow escorted by daughter with medication list. Was discussed with Aura Dials is aware.        COVID-19 Pre-Screening Questions:  . In the past 7 to 10 days have you had a cough,  shortness of breath, headache, congestion, fever (100 or greater) body aches, chills, sore throat, or sudden loss of taste or sense of smell?  NO and family member . Have you been around anyone with known Covid 19. NO and family member . Have you been around anyone who is awaiting Covid 19 test results in the past 7 to 10 days? NO and family member Have you been around anyone who has been exposed to Covid 19, or has mentioned symptoms of Covid 19 within the past 7 to 10 days? NO and family member.  If you have any concerns/questions about symptoms patients report during screening (either on the phone or at threshold). Contact the provider seeing the patient or DOD for further guidance.  If neither are available contact a member of the leadership team.

## 2019-01-13 NOTE — Telephone Encounter (Signed)
New Message             Patient's daughter is calling and would like a call back concerning her father. Patient has some swelling, sob, not sleeping well at night. Patient collapsed on Sunday at the daughters house. Daughter is concerned and would like to talk to someone concerning her father. B/P 160/82 156/76 168/75 Pulse was running 64/68. Pls call and advise.

## 2019-01-13 NOTE — Progress Notes (Signed)
CARDIOLOGY OFFICE NOTE  Date:  01/14/2019    [name] [name] Date of Birth: 06-27-1923 Medical Record #[id number]  PCP:  Vernie Shanks, MD  Cardiologist:  Jerel Shepherd    Chief Complaint  Patient presents with   Shortness of Breath    Work in visit - seen for Dr. Burt Knack    History of Present Illness: [name] [name] is a 85 y.o. male who presents today for a work in visit. Seen for Dr. Burt Knack.Former patient of Dr. Susa Simmonds. Primarily follows with me.   He has CAD with remote inferior MI, past stent to the LCX and has a totally occluded RCA. Had prior AVR by Dr. Cyndia Bent in 2003, past AAA repair in 2004, PVD, past CVA, HLD, BPH and hypothyroidism. He developed severe bioprosthetic valve aortic insufficiency with worsening heart failure and was ultimately treated with valve and valve TAVR using a 23 mm Edwards Sapian XT valve via a transfemoral approach on 03/23/2014.  I have seen him multiple times over the past few years -hiswifehasdied. He took this pretty hard.He has had some bouts with volume overload. Last echo with more AS - reviewed with Dr. Burt Knack - managed conservatively now. General decline overall. He hasrefused to get a Lifeline. Kids have beenpretty frustrated with him. Lots of medical issues - tried to get him back to his PCP.His medicines are never correct and unclear. He has had some falls - refuses to get a cane/walker or lifeline.   I saw him for a virtual visit in April - seemed to be doing ok for the most part.   Phone call Tuesday - "S/w daughter, [name] [name] per (DPR).  Pt is having Left ankle swelling to thigh, right foot swelling.  Pt stumbled on Sunday but was held up and let down easy.  Pt does not want to eat.  Pt is SOB.  Pt is ill due to COVID and suffocates with mask. Pt cannot hear.  Pt will come in for office visit tomorrow escorted by daughter with medication list. Was discussed with [name] [name] is aware". Thus added to my schedule for  today.   The patient does not have symptoms concerning for COVID-19 infection (fever, chills, cough, or new shortness of breath).   Comes in today. Here with his daughter [name] [name] today. There are lots of concerns. He has had more shortness of breath. He is having trouble lying down at night because of his breathing. He has had more swelling - this is better today. He is having more balance issues - had an "almost" fall this weekend. He is having lot of difficulty wearing a mask - this makes him feel like he is smothering - he does not feel he has to do this. Hearing is worse. He does not wish to have any resuscitative efforts - sounds like he has a DNR signed somewhere in his legal papers. Family is frustrated and not sure what to do. He is not going to stay at home - he feels like he has "been caged" due to the [place]. He is basically not taking his Lasix or potassium - too much incontinence if he is out and not at home.   Past Medical History:  Diagnosis Date   AAA (abdominal aortic aneurysm) ([place])    s/p endovascular repair 2004   Aortic insufficiency    Prosthetic valve dysfunction   Aortic stenosis    s/p AVR in 2003, bovine   Arthritis  BCC (basal cell carcinoma) 09/17/2012   BPH (benign prostatic hyperplasia)    CAD (coronary artery disease)    stents in LXC and has a totally occluded RCA   Chicken pox    CVA (cerebral infarction) 12/09   Depression    Diverticulosis    Glaucoma    recently told by Dr Janyth Contes he did not have it   HTN (hypertension)    Hyperlipemia    Leaky heart valve    Measles    Mumps    Myocardial infarction [facility])    Personal history of poliomyelitis 09/17/2012   In childhood   Pneumonia    hx of   Polio    Prosthetic valve dysfunction    Aortic stenosis and insufficiency    Right iliac artery stenosis (HCC)    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   23 mm Edwards Sapien transcatheter heart valve placed via  open left transfemoral approach   Shortness of breath    exertion, orthopnea   Sleeping difficulties    Stroke [facility])    no residual   Thyroid disease     Past Surgical History:  Procedure Laterality Date   ABDOMINAL AORTIC ANEURYSM REPAIR  2004   s/p endovascular repiar of AAA in 2004   AORTIC VALVE REPLACEMENT  2003   #66mm pericardial valve    APPENDECTOMY     CARDIAC CATHETERIZATION     CORONARY ANGIOPLASTY     CORONARY ARTERY BYPASS GRAFT     CORONARY STENT PLACEMENT     INTRAOPERATIVE TRANSESOPHAGEAL ECHOCARDIOGRAM N/A 03/23/2014   Procedure: INTRAOPERATIVE TRANSESOPHAGEAL ECHOCARDIOGRAM;  Surgeon: Sherren Mocha, MD;  Location: [place];  Service: Open Heart Surgery;  Laterality: N/A;   JOINT REPLACEMENT     LEFT AND RIGHT HEART CATHETERIZATION WITH CORONARY ANGIOGRAM N/A 02/08/2014   Procedure: LEFT AND RIGHT HEART CATHETERIZATION WITH CORONARY ANGIOGRAM;  Surgeon: Blane Ohara, MD;  Location: [facility] CATH LAB;  Service: Cardiovascular;  Laterality: N/A;   TEE WITHOUT CARDIOVERSION N/A 09/02/2013   Procedure: TRANSESOPHAGEAL ECHOCARDIOGRAM (TEE);  Surgeon: Dorothy Spark, MD;  Location: [place];  Service: Cardiovascular;  Laterality: N/A;   TONSILLECTOMY     TRANSCATHETER AORTIC VALVE REPLACEMENT, TRANSFEMORAL N/A 03/23/2014   Procedure: TRANSCATHETER AORTIC VALVE REPLACEMENT, TRANSFEMORAL;  Surgeon: Sherren Mocha, MD;  Location: [place];  Service: Open Heart Surgery;  Laterality: N/A;  valve in valve TAVR   WISDOM TOOTH EXTRACTION       Medications: Current Meds  Medication Sig   acetaminophen (TYLENOL) 500 MG tablet Take 1,000 mg by mouth every 4 (four) hours as needed.   aspirin 81 MG tablet Take 81 mg by mouth daily.   atorvastatin (LIPITOR) 80 MG tablet TAKE 1 TABLET BY MOUTH  DAILY   B Complex-C (SUPER B COMPLEX) TABS Take 1 tablet by mouth daily.   clindamycin (CLEOCIN) 150 MG capsule TK 4 CS PO PRIOR TO DENTAL APPOINTMENT   finasteride  (PROSCAR) 5 MG tablet TAKE 1 TABLET BY MOUTH  DAILY   furosemide (LASIX) 40 MG tablet TAKE ONE-HALF TABLET BY  MOUTH DAILY   levothyroxine (SYNTHROID, LEVOTHROID) 50 MCG tablet Take 1 tablet (50 mcg total) by mouth daily.   losartan (COZAAR) 100 MG tablet TAKE 1 TABLET BY MOUTH  DAILY   metoprolol tartrate (LOPRESSOR) 25 MG tablet Take 1 tablet (25 mg total) by mouth 2 (two) times daily.   Multiple Vitamins-Minerals (PRESERVISION AREDS 2 PO) Take 1 capsule by mouth 2 (two) times daily.  PARoxetine (PAXIL) 20 MG tablet Take 0.5 tablets (10 mg total) by mouth every morning.   potassium chloride (K-DUR) 10 MEQ tablet Take 1 tablet (10 mEq total) by mouth daily.     Allergies: Allergies  Allergen Reactions   Mucinex [Guaifenesin Er]     Tongue swelling   Amoxicillin-Pot Clavulanate Itching   Morphine And Related Nausea And Vomiting   Penicillins Hives    Social History: The patient  reports that he quit smoking about 38 years ago. His smoking use included cigarettes. He has a 15.00 pack-year smoking history. He has never used smokeless tobacco. He reports current alcohol use of about 7.0 standard drinks of alcohol per week. He reports that he does not use drugs.   Family History: The patient's family history includes Aneurysm in his father; Cancer in his brother, daughter, and mother; Colon cancer in his mother; Heart attack in his brother; Heart disease in his brother; Prostate cancer in his brother.   Review of Systems: Please see the history of present illness.   All other systems are reviewed and negative.   Physical Exam: VS:  BP 130/82    Pulse 80    Ht 5\' 6"  (1.676 m)    Wt 193 lb 12.8 oz (87.9 kg)    SpO2 93%    BMI 31.28 kg/m  .  BMI Body mass index is 31.28 kg/m.  Wt Readings from Last 3 Encounters:  01/14/19 193 lb 12.8 oz (87.9 kg)  10/27/18 190 lb (86.2 kg)  09/30/18 190 lb 0.6 oz (86.2 kg)    General: Elderly. Alert. He is very hard of hearing. Weight  up from 191 when I saw him in December.   HEENT: Normal.  Neck: Supple, no JVD, carotid bruits, or masses noted.  Cardiac: Regular rate and rhythm. Harsh outflow murmur - may be worse.  No edema noted - legs are brawny.  Respiratory:  Lungs are clear to auscultation bilaterally with normal work of breathing.  GI: Soft and nontender.  MS: No deformity or atrophy. Gait and ROM intact.  Skin: Warm and dry. Color is normal.  Neuro:  Strength and sensation are intact and no gross focal deficits noted.  Psych: Alert, appropriate and with normal affect.   LABORATORY DATA:  EKG:  EKG is not ordered today.  Lab Results  Component Value Date   WBC 7.6 07/24/2017   HGB 14.4 07/24/2017   HCT 41.6 07/24/2017   PLT 149 (L) 07/24/2017   GLUCOSE 75 02/24/2018   CHOL 187 02/24/2018   TRIG 187 (H) 02/24/2018   HDL 38 (L) 02/24/2018   LDLCALC 112 (H) 02/24/2018   ALT 29 02/24/2018   AST 36 02/24/2018   NA 146 (H) 02/24/2018   K 4.7 02/24/2018   CL 105 02/24/2018   CREATININE 1.19 02/24/2018   BUN 24 02/24/2018   CO2 25 02/24/2018   TSH 4.090 09/10/2016   INR 1.25 03/23/2014   HGBA1C 6.3 (H) 03/19/2014     BNP (last 3 results) No results for input(s): BNP in the last 8760 hours.  ProBNP (last 3 results) No results for input(s): PROBNP in the last 8760 hours.   Other Studies Reviewed Today:  ECHO Study Conclusions 10/2016  - Left ventricle: The cavity size was normal. Wall thickness was increased in a pattern of moderate LVH. Systolic function was normal. The estimated ejection fraction was in the range of 50% to 55%. Wall motion was normal; there were no regional wall motion abnormalities.  Doppler parameters are consistent with abnormal left ventricular relaxation (grade 1 diastolic dysfunction). Doppler parameters are consistent with high ventricular filling pressure. - Aortic valve: A transcatheter aortic valve bioprosthesis was present. There was severe  stenosis. Peak velocity (S): 413 cm/s. Mean gradient (S): 41 mm Hg. Valve area (VTI): 0.59 cm^2. Valve area (Vmax): 0.53 cm^2. Valve area (Vmean): 0.57 cm^2. - Mitral valve: Transvalvular velocity was within the normal range. There was no evidence for stenosis. There was mild regurgitation. - Right ventricle: The cavity size was normal. Wall thickness was normal. Systolic function was normal. - Atrial septum: No defect or patent foramen ovale was identified by color flow Doppler. - Tricuspid valve: There was trivial regurgitation. - Pulmonary arteries: Systolic pressure was within the normal range. PA peak pressure: 15 mm Hg (S).  Impressions:  - There is a TAVR bioprosthesis inside a bioprosthetic aortic valve. Since the echo 10/27/15, the mean gradient of the TAVR has increased from 35 mmHg to 41 mmHg, conistent with severe stenosis.    ASSESSMENT & PLAN:    1. Aortic valve disease s/p valve-in-valve TAVR: Pt with NYHA III symptoms now - prior echo showed worsening AS - no further interventions are warranted - trying to continue with medical management. I think our goal needs to be safety going forward. Family agrees.  I suspect he has worsening AS and is becoming more symptomatic. Family is going to have him stop driving - I concur with this decision. Putting him on Lasix 40 a day from now til Sunday - then down to 20 mg along with the potassium daily. Checking lab today as well. Tentatively see back as planned. 45 minute visit today with him and his daughter.   2. HTN- BP ok - no changes made today.   3. CAD- no active chest pain noted.    4.Advancing age - seems to be declining. Apparently has DNR papers at home - daughter is going to look. Does not need Hospice services at this time but may going forward and I am happy to facilitate if and when needed.   5. COVID-19 Education: The signs and symptoms of COVID-19 were discussed with the patient  and how to seek care for testing (follow up with PCP or arrange E-visit).  The importance of social distancing, staying at home, hand hygiene and wearing a mask when out in public were discussed today. He is having extreme difficulty with the mask. He understands that he is high risk    Current medicines are reviewed with the patient today.  The patient does not have concerns regarding medicines other than what has been noted above.  The following changes have been made:  See above.  Labs/ tests ordered today include:    Orders Placed This Encounter  Procedures   Basic metabolic panel   CBC   TSH   Pro b natriuretic peptide (BNP)   Lipid panel   Hepatic function panel     Disposition:   FU with me in August as planned unless needed sooner.    Patient is agreeable to this plan and will call if any problems develop in the interim.   SignedTruitt Merle, NP  01/14/2019 11:40 AM  [place] [address] [place], [place]  [address] Phone: [phone] Fax: [phone]

## 2019-01-14 ENCOUNTER — Ambulatory Visit (INDEPENDENT_AMBULATORY_CARE_PROVIDER_SITE_OTHER): Payer: Medicare Other | Admitting: Nurse Practitioner

## 2019-01-14 ENCOUNTER — Encounter: Payer: Self-pay | Admitting: Nurse Practitioner

## 2019-01-14 ENCOUNTER — Other Ambulatory Visit: Payer: Self-pay

## 2019-01-14 VITALS — BP 130/82 | HR 80 | Ht 66.0 in | Wt 193.8 lb

## 2019-01-14 DIAGNOSIS — E7800 Pure hypercholesterolemia, unspecified: Secondary | ICD-10-CM

## 2019-01-14 DIAGNOSIS — R0600 Dyspnea, unspecified: Secondary | ICD-10-CM

## 2019-01-14 DIAGNOSIS — Z952 Presence of prosthetic heart valve: Secondary | ICD-10-CM | POA: Diagnosis not present

## 2019-01-14 DIAGNOSIS — I5032 Chronic diastolic (congestive) heart failure: Secondary | ICD-10-CM

## 2019-01-14 DIAGNOSIS — I259 Chronic ischemic heart disease, unspecified: Secondary | ICD-10-CM

## 2019-01-14 DIAGNOSIS — I10 Essential (primary) hypertension: Secondary | ICD-10-CM

## 2019-01-14 NOTE — Patient Instructions (Addendum)
After Visit Summary:  We will be checking the following labs today - BMET, CBC, TSH and BNP   Medication Instructions:    Continue with your current medicines. BUT  You are to take a whole fluid pill every day from now til Sunday - then just 1/2 tablet every day  Take the potassium tablet every day   If you need a refill on your cardiac medications before your next appointment, please call your pharmacy.     Testing/Procedures To Be Arranged:  N/A  Follow-Up:   See me as planned in August    At [facility], you and your health needs are our priority.  As part of our continuing mission to provide you with exceptional heart care, we have created designated Provider Care Teams.  These Care Teams include your primary Cardiologist (physician) and Advanced Practice Providers (APPs -  Physician Assistants and Nurse Practitioners) who all work together to provide you with the care you need, when you need it.  Special Instructions:  . Stay safe, stay home, wash your hands for at least 20 seconds and wear a mask when out in public.  . It was good to talk with you today.    Call the [place] office at [phone] if you have any questions, problems or concerns.

## 2019-01-15 LAB — BASIC METABOLIC PANEL
BUN/Creatinine Ratio: 18 (ref 10–24)
BUN: 23 mg/dL (ref 10–36)
CO2: 22 mmol/L (ref 20–29)
Calcium: 9.3 mg/dL (ref 8.6–10.2)
Chloride: 106 mmol/L (ref 96–106)
Creatinine, Ser: 1.28 mg/dL — ABNORMAL HIGH (ref 0.76–1.27)
GFR calc Af Amer: 55 mL/min/{1.73_m2} — ABNORMAL LOW (ref >59)
GFR calc non Af Amer: 48 mL/min/{1.73_m2} — ABNORMAL LOW (ref >59)
Glucose: 66 mg/dL (ref 65–99)
Potassium: 4.7 mmol/L (ref 3.5–5.2)
Sodium: 146 mmol/L — ABNORMAL HIGH (ref 134–144)

## 2019-01-15 LAB — CBC
Hematocrit: 43.5 % (ref 37.5–51.0)
Hemoglobin: 15 g/dL (ref 13.0–17.7)
MCH: 31.9 pg (ref 26.6–33.0)
MCHC: 34.5 g/dL (ref 31.5–35.7)
MCV: 93 fL (ref 79–97)
Platelets: 139 10*3/uL — ABNORMAL LOW (ref 150–450)
RBC: 4.7 x10E6/uL (ref 4.14–5.80)
RDW: 12.4 % (ref 11.6–15.4)
WBC: 8.9 10*3/uL (ref 3.4–10.8)

## 2019-01-15 LAB — LIPID PANEL
Chol/HDL Ratio: 4.5 ratio (ref 0.0–5.0)
Cholesterol, Total: 177 mg/dL (ref 100–199)
HDL: 39 mg/dL — ABNORMAL LOW (ref >39)
LDL Calculated: 109 mg/dL — ABNORMAL HIGH (ref 0–99)
Triglycerides: 145 mg/dL (ref 0–149)
VLDL Cholesterol Cal: 29 mg/dL (ref 5–40)

## 2019-01-15 LAB — PRO B NATRIURETIC PEPTIDE: NT-Pro BNP: 2854 pg/mL — ABNORMAL HIGH (ref 0–486)

## 2019-01-15 LAB — HEPATIC FUNCTION PANEL
ALT: 22 IU/L (ref 0–44)
AST: 27 IU/L (ref 0–40)
Albumin: 3.9 g/dL (ref 3.5–4.6)
Alkaline Phosphatase: 110 IU/L (ref 39–117)
Bilirubin Total: 1.3 mg/dL — ABNORMAL HIGH (ref 0.0–1.2)
Bilirubin, Direct: 0.28 mg/dL (ref 0.00–0.40)
Total Protein: 6.7 g/dL (ref 6.0–8.5)

## 2019-01-15 LAB — TSH: TSH: 4.42 u[IU]/mL (ref 0.450–4.500)

## 2019-01-28 ENCOUNTER — Telehealth: Payer: Self-pay | Admitting: Nurse Practitioner

## 2019-01-28 MED ORDER — POTASSIUM CHLORIDE ER 10 MEQ PO CPCR: 10.0000 meq | ORAL_CAPSULE | Freq: Every day | ORAL | 3 refills | Status: DC

## 2019-01-28 NOTE — Telephone Encounter (Signed)
Pts daughter called to report that the pt has been having a hard time swallowing his K tablet due to its size... she is asking for a capsule instead that would be easier for him to swallow... his mouth is usually dry and the tablet has been a problem for him... spoke with the RX at [facility] and they recommend the K liquid and a capsule.. she requests to try to the capsule and will let us know if he is still having problems and needs to talk with Truitt Merle NP about switching to the liquid.

## 2019-01-28 NOTE — Telephone Encounter (Signed)
New message:    Patient daughter calling concering some medication issues. Please call patient daughter.

## 2019-02-09 ENCOUNTER — Telehealth: Payer: Self-pay | Admitting: *Deleted

## 2019-02-09 ENCOUNTER — Other Ambulatory Visit: Payer: Self-pay | Admitting: Cardiology

## 2019-02-09 MED ORDER — CLINDAMYCIN HCL 150 MG PO CAPS: ORAL_CAPSULE | ORAL | 0 refills | Status: DC

## 2019-02-09 NOTE — Telephone Encounter (Signed)
   [facility] Pre-operative Risk Assessment    Request for surgical clearance:  1. What type of surgery is being performed? EXTRACTION OF TOOTH # 22  2. When is this surgery scheduled? TBD  3. What type of clearance is required (medical clearance vs. Pharmacy clearance to hold med vs. Both)? MEDICAL  4. Are there any medications that need to be held prior to surgery and how long? ASA   5. Practice name and name of physician performing surgery? Fawn Kirk, DDS.,M.S. PRACTICE LIMITED to [place]   6. What is your office phone number [phone]   7.   What is your office fax number [phone]  8.   Anesthesia type (None, local, MAC, general) ? NONE LISTED    [name] [name] 02/09/2019, 1:17 PM  _________________________________________________________________   (provider comments below)

## 2019-02-09 NOTE — Telephone Encounter (Signed)
   Primary Cardiologist: No primary care provider on file.  Chart reviewed as part of pre-operative protocol coverage. Patient was contacted 02/09/2019 in reference to pre-operative risk assessment for pending surgery as outlined below.  [name] [name] was last seen on 01/14/2019 by Truitt Merle, NP.   Since that day, [name] [name] has done better. I attempted to speak to to the patient, however he was unable to hear me over the phone. I spoke with his daughter who is very involved with his care. She states he is doing much better from a cardiac perspective since his last appointment. He has been taking his medications and is not having issues with SOB or fluid volume overload.   He will need pre-procedure antibiotics for his upcoming tooth extraction. Clindamycin refill was sent to primary pharmacy.   ASA is typically not held in the setting of a single tooth extraction.   Therefore, based on ACC/AHA guidelines, the patient would be at acceptable risk for the planned procedure without further cardiovascular testing.   I will route this recommendation to the requesting party via Epic fax function and remove from pre-op pool.  Please call with questions.  Kathyrn Drown, NP 02/09/2019, 2:41 PM

## 2019-02-12 ENCOUNTER — Telehealth: Payer: Self-pay | Admitting: Nurse Practitioner

## 2019-02-12 NOTE — Telephone Encounter (Signed)
Pt's daughter aware pt needs antibiotic prior to dental procedure ./cy

## 2019-02-12 NOTE — Telephone Encounter (Signed)
Daughter called the office returning a call from [place] in [place]. Ann needed a specific medication, and the daughter was calling back to give [place] that information  The patient takes two tablets of clindamycin (CLEOCIN) 150 MG capsule prior to any dental procedure.  The dentist was calling to make sure that it is OK with his heart doctors that the patient takes this medication.

## 2019-02-12 NOTE — Telephone Encounter (Signed)
Follow Up:    Lanelle Bal from Dr Laur[facility] Record office called. She wanted to check on the status of clearance that sent over on 02-09-19. Please fax this to [phone],

## 2019-02-28 ENCOUNTER — Other Ambulatory Visit: Payer: Self-pay | Admitting: Cardiovascular Disease

## 2019-03-06 NOTE — Progress Notes (Signed)
CARDIOLOGY OFFICE NOTE  Date:  03/10/2019    [name] [name] Date of Birth: 1923/07/07 Medical Record #[id number]  PCP:  Vernie Shanks, MD  Cardiologist:  Jerel Shepherd    Chief Complaint  Patient presents with   Follow-up    Seen for Dr. Burt Knack    History of Present Illness: [name] [name] is a 85 y.o. male who presents today for a 2 month check. Seen for Dr. Burt Knack.Former patient of Dr. Susa Simmonds. Primarily follows with me.   He has CAD with remote inferior MI, past stent to the LCX and has a totally occluded RCA. Had prior AVR by Dr. Cyndia Bent in 2003, past AAA repair in 2004, PVD, past CVA, HLD, BPH and hypothyroidism. He developed severe bioprosthetic valve aortic insufficiency with worsening heart failure and was ultimately treated with valve and valve TAVR using a 23 mm Edwards Sapian XT valve via a transfemoral approach on 03/23/2014.  I have seen him multiple times over the past few years -hiswifehasdied. He took this pretty hard.He has had some bouts with volume overload. Last echo with more AS - reviewed with Dr. Burt Knack - managed conservatively now. General decline overall. He hasrefused to get a Lifeline. Kids have beenpretty frustrated with him. Lots of medical issues - tried to get him back to his PCP.His medicines are never correct and unclear.He has had some falls - refuses to get a cane/walker or lifeline.   I saw him for a virtual visit in April - seemed to be doing ok for the most part. Then seen as a work in back in June - more swelling - falling - SOB. There were lots of concerns. He was not wearing a mask - not staying at home. Not taking his Lasix due to going out and having incontinence.   The patient does not have symptoms concerning for COVID-19 infection (fever, chills, cough, or new shortness of breath).   Comes in today. Here with his daughter. He is doing better. He has been taking his diuretic. He is also on a nose spray BID. He is  sleeping better. He is staying at home more now. Has stopped driving. But drove once last week. Cautioned and advised against this. Family is happy with how he is doing.   Past Medical History:  Diagnosis Date   AAA (abdominal aortic aneurysm) ([place])    s/p endovascular repair 2004   Aortic insufficiency    Prosthetic valve dysfunction   Aortic stenosis    s/p AVR in 2003, bovine   Arthritis    BCC (basal cell carcinoma) 09/17/2012   BPH (benign prostatic hyperplasia)    CAD (coronary artery disease)    stents in [facility] and has a totally occluded RCA   Chicken pox    CVA (cerebral infarction) 12/09   Depression    Diverticulosis    Glaucoma    recently told by Dr Janyth Contes he did not have it   HTN (hypertension)    Hyperlipemia    Leaky heart valve    Measles    Mumps    Myocardial infarction [facility])    Personal history of poliomyelitis 09/17/2012   In childhood   Pneumonia    hx of   Polio    Prosthetic valve dysfunction    Aortic stenosis and insufficiency    Right iliac artery stenosis (HCC)    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   23 mm Edwards Sapien transcatheter heart valve placed  via open left transfemoral approach   Shortness of breath    exertion, orthopnea   Sleeping difficulties    Stroke [facility])    no residual   Thyroid disease     Past Surgical History:  Procedure Laterality Date   ABDOMINAL AORTIC ANEURYSM REPAIR  2004   s/p endovascular repiar of AAA in 2004   AORTIC VALVE REPLACEMENT  2003   #18mm pericardial valve    APPENDECTOMY     CARDIAC CATHETERIZATION     CORONARY ANGIOPLASTY     CORONARY ARTERY BYPASS GRAFT     CORONARY STENT PLACEMENT     INTRAOPERATIVE TRANSESOPHAGEAL ECHOCARDIOGRAM N/A 03/23/2014   Procedure: INTRAOPERATIVE TRANSESOPHAGEAL ECHOCARDIOGRAM;  Surgeon: Sherren Mocha, MD;  Location: [facility];  Service: Open Heart Surgery;  Laterality: N/A;   JOINT REPLACEMENT     LEFT AND  RIGHT HEART CATHETERIZATION WITH CORONARY ANGIOGRAM N/A 02/08/2014   Procedure: LEFT AND RIGHT HEART CATHETERIZATION WITH CORONARY ANGIOGRAM;  Surgeon: Blane Ohara, MD;  Location: [facility] CATH LAB;  Service: Cardiovascular;  Laterality: N/A;   TEE WITHOUT CARDIOVERSION N/A 09/02/2013   Procedure: TRANSESOPHAGEAL ECHOCARDIOGRAM (TEE);  Surgeon: Dorothy Spark, MD;  Location: [facility];  Service: Cardiovascular;  Laterality: N/A;   TONSILLECTOMY     TRANSCATHETER AORTIC VALVE REPLACEMENT, TRANSFEMORAL N/A 03/23/2014   Procedure: TRANSCATHETER AORTIC VALVE REPLACEMENT, TRANSFEMORAL;  Surgeon: Sherren Mocha, MD;  Location: [facility];  Service: Open Heart Surgery;  Laterality: N/A;  valve in valve TAVR   WISDOM TOOTH EXTRACTION       Medications: Current Meds  Medication Sig   acetaminophen (TYLENOL) 500 MG tablet Take 1,000 mg by mouth every 4 (four) hours as needed.   aspirin 81 MG tablet Take 81 mg by mouth daily.   atorvastatin (LIPITOR) 80 MG tablet TAKE 1 TABLET BY MOUTH  DAILY   B Complex-C (SUPER B COMPLEX) TABS Take 1 tablet by mouth daily.   clindamycin (CLEOCIN) 150 MG capsule TK 4 CS PO PRIOR TO DENTAL APPOINTMENT   finasteride (PROSCAR) 5 MG tablet TAKE 1 TABLET BY MOUTH  DAILY   furosemide (LASIX) 40 MG tablet TAKE ONE-HALF TABLET BY  MOUTH DAILY   levothyroxine (SYNTHROID, LEVOTHROID) 50 MCG tablet Take 1 tablet (50 mcg total) by mouth daily.   losartan (COZAAR) 100 MG tablet TAKE 1 TABLET BY MOUTH  DAILY   metoprolol tartrate (LOPRESSOR) 25 MG tablet TAKE 1 TABLET BY MOUTH TWO  TIMES DAILY   Multiple Vitamins-Minerals (PRESERVISION AREDS 2 PO) Take 1 capsule by mouth 2 (two) times daily.    PARoxetine (PAXIL) 20 MG tablet Take 0.5 tablets (10 mg total) by mouth every morning.   potassium chloride (MICRO-K) 10 MEQ CR capsule Take 1 capsule (10 mEq total) by mouth daily.     Allergies: Allergies  Allergen Reactions   Mucinex [Guaifenesin Er]     Tongue  swelling   Amoxicillin-Pot Clavulanate Itching   Morphine And Related Nausea And Vomiting   Penicillins Hives    Social History: The patient  reports that he quit smoking about 38 years ago. His smoking use included cigarettes. He has a 15.00 pack-year smoking history. He has never used smokeless tobacco. He reports current alcohol use of about 7.0 standard drinks of alcohol per week. He reports that he does not use drugs.   Family History: The patient's family history includes Aneurysm in his father; Cancer in his brother, daughter, and mother; Colon cancer in his mother; Heart attack in his brother;  Heart disease in his brother; Prostate cancer in his brother.   Review of Systems: Please see the history of present illness.   All other systems are reviewed and negative.   Physical Exam: VS:  BP 110/82 (BP Location: Left Arm, Patient Position: Sitting, Cuff Size: Normal)    Pulse 64    Ht 5\' 6"  (1.676 m)    Wt 193 lb (87.5 kg)    SpO2 95%    BMI 31.15 kg/m  .  BMI Body mass index is 31.15 kg/m.  Wt Readings from Last 3 Encounters:  03/10/19 193 lb (87.5 kg)  01/14/19 193 lb 12.8 oz (87.9 kg)  10/27/18 190 lb (86.2 kg)    General:  Elderly. Alert - still quite spry - he is in no acute distress.   HEENT: Normal.  Neck: Supple, no JVD, carotid bruits, or masses noted.  Cardiac: Regular rate and rhythm. Outflow murmur noted.  No edema.  Respiratory:  Lungs are clear to auscultation bilaterally with normal work of breathing.  GI: Soft and nontender.  MS: No deformity or atrophy. Gait and ROM intact.  Skin: Warm and dry. Color is normal.  Neuro:  Strength and sensation are intact and no gross focal deficits noted.  Psych: Alert, appropriate and with normal affect.   LABORATORY DATA:  EKG:  EKG is not ordered today.  Lab Results  Component Value Date   WBC 8.9 01/14/2019   HGB 15.0 01/14/2019   HCT 43.5 01/14/2019   PLT 139 (L) 01/14/2019   GLUCOSE 66 01/14/2019   CHOL  177 01/14/2019   TRIG 145 01/14/2019   HDL 39 (L) 01/14/2019   LDLCALC 109 (H) 01/14/2019   ALT 22 01/14/2019   AST 27 01/14/2019   NA 146 (H) 01/14/2019   K 4.7 01/14/2019   CL 106 01/14/2019   CREATININE 1.28 (H) 01/14/2019   BUN 23 01/14/2019   CO2 22 01/14/2019   TSH 4.420 01/14/2019   INR 1.25 03/23/2014   HGBA1C 6.3 (H) 03/19/2014     BNP (last 3 results) No results for input(s): BNP in the last 8760 hours.  ProBNP (last 3 results) Recent Labs    01/14/19 1146  PROBNP 2,854*     Other Studies Reviewed Today:  ECHO Study Conclusions 10/2016  - Left ventricle: The cavity size was normal. Wall thickness was increased in a pattern of moderate LVH. Systolic function was normal. The estimated ejection fraction was in the range of 50% to 55%. Wall motion was normal; there were no regional wall motion abnormalities. Doppler parameters are consistent with abnormal left ventricular relaxation (grade 1 diastolic dysfunction). Doppler parameters are consistent with high ventricular filling pressure. - Aortic valve: A transcatheter aortic valve bioprosthesis was present. There was severe stenosis. Peak velocity (S): 413 cm/s. Mean gradient (S): 41 mm Hg. Valve area (VTI): 0.59 cm^2. Valve area (Vmax): 0.53 cm^2. Valve area (Vmean): 0.57 cm^2. - Mitral valve: Transvalvular velocity was within the normal range. There was no evidence for stenosis. There was mild regurgitation. - Right ventricle: The cavity size was normal. Wall thickness was normal. Systolic function was normal. - Atrial septum: No defect or patent foramen ovale was identified by color flow Doppler. - Tricuspid valve: There was trivial regurgitation. - Pulmonary arteries: Systolic pressure was within the normal range. PA peak pressure: 15 mm Hg (S).  Impressions:  - There is a TAVR bioprosthesis inside a bioprosthetic aortic valve. Since the echo 10/27/15, the mean  gradient of the  TAVR has increased from 35 mmHg to 41 mmHg, conistent with severe stenosis.    ASSESSMENT & PLAN:   1. Aortic valve disease s/p valve-in-valve TAVR: Pt with NYHA III symptoms now - prior echo showed worsening AS - no further interventions are warranted - trying to continue with medical management. We have shifted our goals towards safety. We have him back on lasix that he is now taking much more regularly. This has helped his overall situation. No changes made today.   2. HTN- BP looks ok - no changes made today.   3. CAD- he has no active chest pain - would favor conservative management.   4.Advancing age - he looks better today - still not happy with all the mask mandate/pandemic. But he looks better. Advised to stop driving.  Safety is the goal going forward.   5. COVID-19 Education: The signs and symptoms of COVID-19 were discussed with the patient and how to seek care for testing (follow up with PCP or arrange E-visit).  The importance of social distancing, staying at home, hand hygiene and wearing a mask when out in public were discussed today.  Current medicines are reviewed with the patient today.  The patient does not have concerns regarding medicines other than what has been noted above.  The following changes have been made:  See above.  Labs/ tests ordered today include:   No orders of the defined types were placed in this encounter.    Disposition:   FU with me in 3 to 4 months.   Patient is agreeable to this plan and will call if any problems develop in the interim.   SignedTruitt Merle, NP  03/10/2019 2:10 PM  [facility] [address] [place], [place]  [address] Phone: [phone] Fax: [phone]

## 2019-03-10 ENCOUNTER — Ambulatory Visit (INDEPENDENT_AMBULATORY_CARE_PROVIDER_SITE_OTHER): Payer: Medicare Other | Admitting: Nurse Practitioner

## 2019-03-10 ENCOUNTER — Encounter: Payer: Self-pay | Admitting: Nurse Practitioner

## 2019-03-10 ENCOUNTER — Other Ambulatory Visit: Payer: Self-pay

## 2019-03-10 VITALS — BP 110/82 | HR 64 | Ht 66.0 in | Wt 193.0 lb

## 2019-03-10 DIAGNOSIS — I5032 Chronic diastolic (congestive) heart failure: Secondary | ICD-10-CM | POA: Diagnosis not present

## 2019-03-10 DIAGNOSIS — I259 Chronic ischemic heart disease, unspecified: Secondary | ICD-10-CM

## 2019-03-10 DIAGNOSIS — I110 Hypertensive heart disease with heart failure: Secondary | ICD-10-CM

## 2019-03-10 DIAGNOSIS — Z7189 Other specified counseling: Secondary | ICD-10-CM

## 2019-03-10 DIAGNOSIS — Z952 Presence of prosthetic heart valve: Secondary | ICD-10-CM

## 2019-03-10 DIAGNOSIS — I10 Essential (primary) hypertension: Secondary | ICD-10-CM

## 2019-03-10 DIAGNOSIS — E7800 Pure hypercholesterolemia, unspecified: Secondary | ICD-10-CM

## 2019-03-10 DIAGNOSIS — R0600 Dyspnea, unspecified: Secondary | ICD-10-CM

## 2019-03-10 DIAGNOSIS — I38 Endocarditis, valve unspecified: Secondary | ICD-10-CM

## 2019-03-10 NOTE — Patient Instructions (Addendum)
After Visit Summary:  We will be checking the following labs today - NONE   Medication Instructions:    Continue with your current medicines.    If you need a refill on your cardiac medications before your next appointment, please call your pharmacy.     Testing/Procedures To Be Arranged:  N/A  Follow-Up:   See me in 3 to 4 months.     At [facility], you and your health needs are our priority.  As part of our continuing mission to provide you with exceptional heart care, we have created designated Provider Care Teams.  These Care Teams include your primary Cardiologist (physician) and Advanced Practice Providers (APPs -  Physician Assistants and Nurse Practitioners) who all work together to provide you with the care you need, when you need it.  Special Instructions:  . Stay safe, stay home, wash your hands for at least 20 seconds and wear a mask when out in public.  . It was good to talk with you today.  . Remember - we talked about NOT driving anymore   Call the [place] office at [phone] if you have any questions, problems or concerns.

## 2019-05-05 ENCOUNTER — Telehealth: Payer: Self-pay | Admitting: Nurse Practitioner

## 2019-05-05 DIAGNOSIS — I5032 Chronic diastolic (congestive) heart failure: Secondary | ICD-10-CM

## 2019-05-05 NOTE — Telephone Encounter (Signed)
° ° °  Daughter calling to report tongue swelling last week . Patient has stopped taking Lasix about 5 days ago. No swelling No chest Pain  No SOB  Call Daughter  [name] [phone]

## 2019-05-05 NOTE — Telephone Encounter (Signed)
[name] per [facility]) pt stated lasix makes pt's tongue swell and hard to swallow.  Has been off medication for a week with no swelling, sob, or chest pain.  Pt is going to stay off lasix for one more week and call if any swelling.  Advised with Cecille Rubin. Pt has appt in December.

## 2019-05-13 MED ORDER — TORSEMIDE 20 MG PO TABS: 20.0000 mg | ORAL_TABLET | Freq: Every day | ORAL | 6 refills | Status: DC

## 2019-05-13 NOTE — Telephone Encounter (Signed)
I spoke with pt's daughter and gave her information from Truitt Merle, NP. Pt will stop furosemide (has not been taking) and start torsemide.  Will send prescription to [facility] on [address] in [place]. Pt will come to office for BMP on October 30,2020 at 1:30

## 2019-05-13 NOTE — Addendum Note (Signed)
Addended by: Thompson Grayer on: 05/13/2019 05:27 PM   Modules accepted: Orders

## 2019-05-13 NOTE — Telephone Encounter (Signed)
Follow Up  Patients daughters would like a new medication instead of lasix. Patient is experiencing swelling and is ready to be on a different medication other than lasix.

## 2019-05-13 NOTE — Telephone Encounter (Signed)
Please call back his daughter. Can do Torsemide 20 mg a day. BMET in one week please.   Burtis Junes, RN, [facility] [address] [place], [place]  [address] [phone]

## 2019-05-13 NOTE — Telephone Encounter (Signed)
Pt's daughter calling in today stated pt gets tongue swelling from taking Lasixs.  Pt hasn't taken lasix in two weeks and notice slight edema in both ankles.  Pt would like a different diuretic that does not make tongue swell.  Will send to Cecille Rubin to advise.

## 2019-05-22 ENCOUNTER — Other Ambulatory Visit: Payer: Self-pay

## 2019-05-22 ENCOUNTER — Other Ambulatory Visit: Payer: Medicare Other | Admitting: *Deleted

## 2019-05-22 DIAGNOSIS — I5032 Chronic diastolic (congestive) heart failure: Secondary | ICD-10-CM

## 2019-05-23 LAB — BASIC METABOLIC PANEL
BUN/Creatinine Ratio: 20 (ref 10–24)
BUN: 35 mg/dL (ref 10–36)
CO2: 26 mmol/L (ref 20–29)
Calcium: 9.3 mg/dL (ref 8.6–10.2)
Chloride: 103 mmol/L (ref 96–106)
Creatinine, Ser: 1.73 mg/dL — ABNORMAL HIGH (ref 0.76–1.27)
GFR calc Af Amer: 38 mL/min/{1.73_m2} — ABNORMAL LOW (ref >59)
GFR calc non Af Amer: 33 mL/min/{1.73_m2} — ABNORMAL LOW (ref >59)
Glucose: 84 mg/dL (ref 65–99)
Potassium: 4.6 mmol/L (ref 3.5–5.2)
Sodium: 143 mmol/L (ref 134–144)

## 2019-05-25 ENCOUNTER — Other Ambulatory Visit: Payer: Self-pay | Admitting: *Deleted

## 2019-05-25 ENCOUNTER — Other Ambulatory Visit: Payer: Self-pay | Admitting: Nurse Practitioner

## 2019-05-25 DIAGNOSIS — R799 Abnormal finding of blood chemistry, unspecified: Secondary | ICD-10-CM

## 2019-05-25 MED ORDER — TORSEMIDE 20 MG PO TABS: 10.0000 mg | ORAL_TABLET | Freq: Every day | ORAL | 6 refills | Status: DC

## 2019-05-28 ENCOUNTER — Telehealth: Payer: Self-pay | Admitting: Nurse Practitioner

## 2019-05-28 NOTE — Telephone Encounter (Signed)
Left message to call back  

## 2019-05-28 NOTE — Telephone Encounter (Signed)
Pt c/o BP issue: STAT if pt c/o blurred vision, one-sided weakness or slurred speech  1. What are your last 5 BP readings? 92/56 HR 70       2. Are you having any other symptoms (ex. Dizziness, headache, blurred vision, passed out)? Feeling very tired  3. What is your BP issue? Patient's daughter stated a  week ago he came in for visit, and his water pill was changed.

## 2019-05-28 NOTE — Telephone Encounter (Signed)
Torsemide was recently decreased due to kidney function.  I spoke with pt's daughter who reports pt has not taken torsemide for the last 2 days.   She reports he is feeling tired. This is worse when he has slept poorly the night before.  Was very tired after going to grocery store on Tuesday.  Had telemedicine visit with primary care today.  BP was 92-101/56.  Dr Jacelyn Grip did not make any medicine changes but asked pt's daughter to make cardiology aware.  Pt does not check BP on a regular basis and has no other readings available. Did check while on the phone with me and it was 110/56 and 110/48.  Daughter reports pt is not having any dizziness, chest pain or increased shortness of breath. She reports he is probably dehydrated as he does not drink much water. Daughter reports Dr Jacelyn Grip asked if cholesterol could be checked when next labs are done in our office.  I asked daughter to check BP daily and keep record or readings.  I advised her to encourage pt to stay hydrated. Instructed her to let us know if pt started feeling dizzy. I told daughter pt did have recent lab work in June in our office that checked cholesterol.  Will forward to Truitt Merle, NP for review/recommendations.

## 2019-05-28 NOTE — Telephone Encounter (Signed)
Let's go ahead and cut the Losartan in half to 50 mg.   I would also ask daughter if she or her brother could make sure his medicines are correct in his pill box/container.   If they could continue to monitor the BP that would be helpful.  Cecille Rubin

## 2019-06-01 MED ORDER — LOSARTAN POTASSIUM 100 MG PO TABS: 50.0000 mg | ORAL_TABLET | Freq: Every day | ORAL | 3 refills | Status: DC

## 2019-06-01 NOTE — Telephone Encounter (Signed)
I spoke with pt's daughter and gave her instructions from Truitt Merle, NP.

## 2019-06-11 ENCOUNTER — Other Ambulatory Visit: Payer: Medicare Other

## 2019-06-16 ENCOUNTER — Other Ambulatory Visit: Payer: Medicare Other

## 2019-06-16 NOTE — Progress Notes (Signed)
CARDIOLOGY OFFICE NOTE  Date:  06/24/2019    [name] [name] Date of Birth: August 25, 1922 Medical Record [id number]  PCP:  Vernie Shanks, MD  Cardiologist:  Jerel Shepherd   Chief Complaint  Patient presents with  . Follow-up    Seen for Dr. Burt Knack    History of Present Illness: [name] [name] is a 85 y.o. male who presents today for a 4 month check.  Seen for Dr. Burt Knack.Former patient of Dr. Susa Simmonds.Primarily follows with me.  He has CAD with remote inferior MI, past stent to the LCX and has a totally occluded RCA. Had prior AVR by Dr. Cyndia Bent in 2003, past AAA repair in 2004, PVD, past CVA, HLD, BPH and hypothyroidism. He developed severe bioprosthetic valve aortic insufficiency with worsening heart failure and was ultimately treated with valve and valve TAVR using a 23 mm Edwards Sapian XT valve via a transfemoral approach on 03/23/2014.  I have seen him multiple times over the past few years -hiswifehasdied. He took this pretty hard.He has had some bouts with volume overload. Last echo with more AS - reviewed with Dr. Burt Knack - managed conservatively now. General decline overall. He hasrefused to get a Lifeline. Kids have beenpretty frustrated with him. Lots of medical issues - tried to get him back to his PCP.His medicines are never correct and unclear.He has had some falls - refuses to get a cane/walker or lifeline.   I saw him for a virtual visit in April - seemed to be doing ok for the most part.Then seen as a work in back in June - more swelling - falling - SOB. There were lots of concerns. He was not wearing a mask - not staying at home. Not taking his Lasix due to going out and having incontinence. Last seen in August - has stopped driving - he seemed to be staying home better. Have changed him to Torsemide due to concern for tongue swelling with his Lasix that he said he had chronically (I was just recently made aware of this).   The patient and son do  not have symptoms concerning for COVID-19 infection (fever, chills, cough, or new shortness of breath).   Comes in today. Here with his son [name] [name] today. Doing ok. Getting more wobbly. No falls. BP can go up at times - nothing really consistent. Breathing is fair. He is trying to stay in. He did sell his car. Tires more easily. Seems more limited by his back - Tom feels it is due to his mattress. No more tongue swelling with the Demedex.   Past Medical History:  Diagnosis Date  . AAA (abdominal aortic aneurysm) ([place])    s/p endovascular repair 2004  . Aortic insufficiency    Prosthetic valve dysfunction  . Aortic stenosis    s/p AVR in 2003, bovine  . Arthritis   . BCC (basal cell carcinoma) 09/17/2012  . BPH (benign prostatic hyperplasia)   . CAD (coronary artery disease)    stents in [place] and has a totally occluded RCA  . Chicken pox   . CVA (cerebral infarction) 12/09  . Depression   . Diverticulosis   . Glaucoma    recently told by Dr Janyth Contes he did not have it  . HTN (hypertension)   . Hyperlipemia   . Leaky heart valve   . Measles   . Mumps   . Myocardial infarction ([place])   . Personal history of poliomyelitis 09/17/2012   In childhood  .  Pneumonia    hx of  . Polio   . Prosthetic valve dysfunction    Aortic stenosis and insufficiency   . Right iliac artery stenosis ([facility])   . 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 Shortness of breath    exertion, orthopnea  . Sleeping difficulties   . Stroke [facility])    no residual  . Thyroid disease     Past Surgical History:  Procedure Laterality Date  . ABDOMINAL AORTIC ANEURYSM REPAIR  2004   s/p endovascular repiar of AAA in 2004  . AORTIC VALVE REPLACEMENT  2003   #73mm pericardial valve   . APPENDECTOMY    . CARDIAC CATHETERIZATION    . CORONARY ANGIOPLASTY    . CORONARY ARTERY BYPASS GRAFT    . CORONARY STENT  PLACEMENT    . INTRAOPERATIVE TRANSESOPHAGEAL ECHOCARDIOGRAM N/A 03/23/2014   Procedure: INTRAOPERATIVE TRANSESOPHAGEAL ECHOCARDIOGRAM;  Surgeon: Sherren Mocha, MD;  Location: [facility] OR;  Service: Open Heart Surgery;  Laterality: N/A;  . JOINT REPLACEMENT    . LEFT AND RIGHT HEART CATHETERIZATION WITH CORONARY ANGIOGRAM N/A 02/08/2014   Procedure: LEFT AND RIGHT HEART CATHETERIZATION WITH CORONARY ANGIOGRAM;  Surgeon: Blane Ohara, MD;  Location: [facility] CATH LAB;  Service: Cardiovascular;  Laterality: N/A;  . TEE WITHOUT CARDIOVERSION N/A 09/02/2013   Procedure: TRANSESOPHAGEAL ECHOCARDIOGRAM (TEE);  Surgeon: Dorothy Spark, MD;  Location: [place];  Service: Cardiovascular;  Laterality: N/A;  . TONSILLECTOMY    . TRANSCATHETER AORTIC VALVE REPLACEMENT, TRANSFEMORAL N/A 03/23/2014   Procedure: TRANSCATHETER AORTIC VALVE REPLACEMENT, TRANSFEMORAL;  Surgeon: Sherren Mocha, MD;  Location: [place];  Service: Open Heart Surgery;  Laterality: N/A;  valve in valve TAVR  . WISDOM TOOTH EXTRACTION       Medications: Current Meds  Medication Sig  . acetaminophen (TYLENOL) 500 MG tablet Take 1,000 mg by mouth every 4 (four) hours as needed.  Marland Kitchen aspirin 81 MG tablet Take 81 mg by mouth daily.  . B Complex-C (SUPER B COMPLEX) TABS Take 1 tablet by mouth daily.  . clindamycin (CLEOCIN) 150 MG capsule TK 4 CS PO PRIOR TO DENTAL APPOINTMENT  . finasteride (PROSCAR) 5 MG tablet TAKE 1 TABLET BY MOUTH  DAILY  . ipratropium (ATROVENT) 0.03 % nasal spray Place 2 sprays into both nostrils as needed.  Marland Kitchen levothyroxine (SYNTHROID, LEVOTHROID) 50 MCG tablet Take 1 tablet (50 mcg total) by mouth daily.  Marland Kitchen losartan (COZAAR) 100 MG tablet Take 0.5 tablets (50 mg total) by mouth daily.  . metoprolol tartrate (LOPRESSOR) 25 MG tablet TAKE 1 TABLET BY MOUTH TWO  TIMES DAILY  . Multiple Vitamins-Minerals (PRESERVISION AREDS 2 PO) Take 1 capsule by mouth 2 (two) times daily.   Marland Kitchen PARoxetine (PAXIL) 20 MG tablet Take 0.5  tablets (10 mg total) by mouth every morning.  . potassium chloride (KLOR-CON) 10 MEQ tablet Take 5 mEq by mouth daily.  . potassium chloride (MICRO-K) 10 MEQ CR capsule Take 1 capsule (10 mEq total) by mouth daily.  Marland Kitchen torsemide (DEMADEX) 20 MG tablet Take 0.5 tablets (10 mg total) by mouth daily.     Allergies: Allergies  Allergen Reactions  . Lasix [Furosemide] Swelling    Tongue swelling  . Mucinex [Guaifenesin Er]     Tongue swelling  . Amoxicillin-Pot Clavulanate Itching  . Morphine And Related Nausea And Vomiting  . Penicillins Hives    Social History: The patient  reports that he quit smoking about 38 years ago. His smoking use included cigarettes. He has a 15.00 pack-year smoking history. He has never used smokeless tobacco. He reports current alcohol use of about 7.0 standard drinks of alcohol per week. He reports that he does not use drugs.   Family History: The patient's family history includes Aneurysm in his father; Cancer in his brother, daughter, and mother; Colon cancer in his mother; Heart attack in his brother; Heart disease in his brother; Prostate cancer in his brother.   Review of Systems: Please see the history of present illness.   All other systems are reviewed and negative.   Physical Exam: VS:  BP 138/72   Pulse 63   Ht 5\' 6"  (1.676 m)   Wt 196 lb 1.9 oz (89 kg)   SpO2 92%   BMI 31.65 kg/m  .  BMI Body mass index is 31.65 kg/m.  Wt Readings from Last 3 Encounters:  06/24/19 196 lb 1.9 oz (89 kg)  03/10/19 193 lb (87.5 kg)  01/14/19 193 lb 12.8 oz (87.9 kg)    General: Pleasant. Elderly. Alert and in no acute distress.  He seems frail.  HEENT: Normal.  Neck: Supple, no JVD, carotid bruits, or masses noted.  Cardiac: Regular rate and rhythm. Harsh murmur noted. No edema.  Respiratory:  Lungs are clear to auscultation bilaterally with normal work of breathing.  GI: Soft and nontender.  MS: No deformity or atrophy. Gait and ROM intact. He is a  little unsteady.  Skin: Warm and dry. Color is normal.  Neuro:  Strength and sensation are intact and no gross focal deficits noted.  Psych: Alert, appropriate and with normal affect.   LABORATORY DATA:  EKG:  EKG is not ordered today.  Lab Results  Component Value Date   WBC 8.9 01/14/2019   HGB 15.0 01/14/2019   HCT 43.5 01/14/2019   PLT 139 (L) 01/14/2019   GLUCOSE 84 05/22/2019   CHOL 177 01/14/2019   TRIG 145 01/14/2019   HDL 39 (L) 01/14/2019   LDLCALC 109 (H) 01/14/2019   ALT 22 01/14/2019   AST 27 01/14/2019   NA 143 05/22/2019   K 4.6 05/22/2019   CL 103 05/22/2019   CREATININE 1.73 (H) 05/22/2019   BUN 35 05/22/2019   CO2 26 05/22/2019   TSH 4.420 01/14/2019   INR 1.25 03/23/2014   HGBA1C 6.3 (H) 03/19/2014     BNP (last 3 results) No results for input(s): BNP in the last 8760 hours.  ProBNP (last 3 results) Recent Labs    01/14/19 1146  PROBNP 2,854*     Other Studies Reviewed Today:  ECHO Study Conclusions 10/2016  - Left ventricle: The cavity size was normal. Wall thickness was increased in a pattern of moderate LVH. Systolic function was normal. The estimated ejection fraction was in the range of 50% to 55%. Wall motion was normal; there were no regional wall motion abnormalities. Doppler parameters are consistent with abnormal left ventricular relaxation (grade 1 diastolic dysfunction). Doppler parameters are consistent with high ventricular filling pressure. - Aortic valve: A transcatheter aortic valve bioprosthesis was present. There was severe stenosis. Peak velocity (S): 413 cm/s. Mean gradient (S): 41 mm Hg. Valve area (VTI): 0.59 cm^2. Valve area (Vmax): 0.53 cm^2. Valve area (Vmean): 0.57 cm^2. - Mitral valve: Transvalvular velocity was within the normal range. There was no evidence for stenosis. There was mild regurgitation. - Right ventricle: The cavity size was normal. Wall thickness was  normal. Systolic  function was normal. - Atrial septum: No defect or patent foramen ovale was identified by color flow Doppler. - Tricuspid valve: There was trivial regurgitation. - Pulmonary arteries: Systolic pressure was within the normal range. PA peak pressure: 15 mm Hg (S).  Impressions:  - There is a TAVR bioprosthesis inside a bioprosthetic aortic valve. Since the echo 10/27/15, the mean gradient of the TAVR has increased from 35 mmHg to 41 mmHg, conistent with severe stenosis.    ASSESSMENT & PLAN:   1. Aortic valve disease s/p valve-in-valve TAVR: He has had progressive symptoms - prior echo with worsening AS - no further interventions are warranted - conservative management. Back on low dose diuretic. Seems stable at this time but overall tenuous.   2. HTN - BP ok here today - would only change his medicines if consistently running high.   3. HLD - ok to stop his statin.   4. CAD - no active chest pain - would favor conservative management. Rechecking BMET and CBC today.   5. Advanced age - turning 68 later this month. His back is bothering him - he is not a candidate for surgery - would favor conservative management.   6. COVID-19 Education: The signs and symptoms of COVID-19 were discussed with the patient and how to seek care for testing (follow up with PCP or arrange E-visit).  The importance of social distancing, staying at home, hand hygiene and wearing a mask when out in public were discussed today.  Current medicines are reviewed with the patient today.  The patient does not have concerns regarding medicines other than what has been noted above.  The following changes have been made:  See above.  Labs/ tests ordered today include:    Orders Placed This Encounter  Procedures  . CBC no Diff     Disposition:   FU with me in 6 months.   Patient is agreeable to this plan and will call if any problems develop in the interim.   SignedTruitt Merle, NP   06/24/2019 2:02 PM  [facility] [address] [place], [place]  [address] Phone: [phone] Fax: [phone]

## 2019-06-24 ENCOUNTER — Encounter: Payer: Self-pay | Admitting: Nurse Practitioner

## 2019-06-24 ENCOUNTER — Other Ambulatory Visit: Payer: Medicare Other | Admitting: *Deleted

## 2019-06-24 ENCOUNTER — Ambulatory Visit (INDEPENDENT_AMBULATORY_CARE_PROVIDER_SITE_OTHER): Payer: Medicare Other | Admitting: Nurse Practitioner

## 2019-06-24 ENCOUNTER — Other Ambulatory Visit: Payer: Self-pay

## 2019-06-24 VITALS — BP 138/72 | HR 63 | Ht 66.0 in | Wt 196.1 lb

## 2019-06-24 DIAGNOSIS — I259 Chronic ischemic heart disease, unspecified: Secondary | ICD-10-CM

## 2019-06-24 DIAGNOSIS — I5032 Chronic diastolic (congestive) heart failure: Secondary | ICD-10-CM

## 2019-06-24 DIAGNOSIS — Z952 Presence of prosthetic heart valve: Secondary | ICD-10-CM | POA: Diagnosis not present

## 2019-06-24 DIAGNOSIS — R799 Abnormal finding of blood chemistry, unspecified: Secondary | ICD-10-CM

## 2019-06-24 NOTE — Patient Instructions (Addendum)
After Visit Summary:  We will be checking the following labs today - BMET & CBC   Medication Instructions:    Continue with your current medicines. BUT  Ok to stop the Lipitor - I have taken this off of your list.    If you need a refill on your cardiac medications before your next appointment, please call your pharmacy.     Testing/Procedures To Be Arranged:  N/A  Follow-Up:   See me in 6 months    At [facility], you and your health needs are our priority.  As part of our continuing mission to provide you with exceptional heart care, we have created designated Provider Care Teams.  These Care Teams include your primary Cardiologist (physician) and Advanced Practice Providers (APPs -  Physician Assistants and Nurse Practitioners) who all work together to provide you with the care you need, when you need it.  Special Instructions:  . Stay safe, stay home, wash your hands for at least 20 seconds and wear a mask when out in public.  . It was good to talk with you both today.    Call the [place] office at [phone] if you have any questions, problems or concerns.

## 2019-06-25 LAB — BASIC METABOLIC PANEL
BUN/Creatinine Ratio: 19 (ref 10–24)
BUN: 26 mg/dL (ref 10–36)
CO2: 28 mmol/L (ref 20–29)
Calcium: 8.9 mg/dL (ref 8.6–10.2)
Chloride: 105 mmol/L (ref 96–106)
Creatinine, Ser: 1.39 mg/dL — ABNORMAL HIGH (ref 0.76–1.27)
GFR calc Af Amer: 50 mL/min/{1.73_m2} — ABNORMAL LOW (ref >59)
GFR calc non Af Amer: 43 mL/min/{1.73_m2} — ABNORMAL LOW (ref >59)
Glucose: 82 mg/dL (ref 65–99)
Potassium: 4.5 mmol/L (ref 3.5–5.2)
Sodium: 144 mmol/L (ref 134–144)

## 2019-06-26 ENCOUNTER — Other Ambulatory Visit: Payer: Self-pay | Admitting: Cardiovascular Disease

## 2019-06-26 MED ORDER — POTASSIUM CHLORIDE ER 10 MEQ PO CPCR: 10.0000 meq | ORAL_CAPSULE | Freq: Every day | ORAL | 3 refills | Status: DC

## 2019-06-29 ENCOUNTER — Other Ambulatory Visit: Payer: Self-pay | Admitting: Nurse Practitioner

## 2019-06-29 MED ORDER — METOPROLOL TARTRATE 25 MG PO TABS: 25.0000 mg | ORAL_TABLET | Freq: Two times a day (BID) | ORAL | 3 refills | Status: DC

## 2019-06-29 MED ORDER — POTASSIUM CHLORIDE ER 10 MEQ PO CPCR: 10.0000 meq | ORAL_CAPSULE | Freq: Every day | ORAL | 3 refills | Status: DC

## 2019-06-29 NOTE — Telephone Encounter (Signed)
Pt's medications were sent to pt's pharmacy as requested. Confirmation received.  

## 2019-06-29 NOTE — Telephone Encounter (Signed)
New Message   *STAT* If patient is at the pharmacy, call can be transferred to refill team.   1. Which medications need to be refilled? (please list name of each medication and dose if known)  metoprolol tartrate (LOPRESSOR) 25 MG tablet     potassium chloride (MICRO-K) 10 MEQ CR capsule   2. Which pharmacy/location (including street and city if local pharmacy) is medication to be sent to? [place], [place]  3. Do they need a 30 day or 90 day supply? 90 day

## 2019-08-28 ENCOUNTER — Other Ambulatory Visit: Payer: Self-pay | Admitting: Nurse Practitioner

## 2019-11-02 ENCOUNTER — Encounter (INDEPENDENT_AMBULATORY_CARE_PROVIDER_SITE_OTHER): Payer: Self-pay | Admitting: Ophthalmology

## 2019-11-02 ENCOUNTER — Other Ambulatory Visit: Payer: Self-pay

## 2019-11-02 ENCOUNTER — Ambulatory Visit (INDEPENDENT_AMBULATORY_CARE_PROVIDER_SITE_OTHER): Payer: Medicare Other | Admitting: Ophthalmology

## 2019-11-02 DIAGNOSIS — H353212 Exudative age-related macular degeneration, right eye, with inactive choroidal neovascularization: Secondary | ICD-10-CM | POA: Insufficient documentation

## 2019-11-02 DIAGNOSIS — H3561 Retinal hemorrhage, right eye: Secondary | ICD-10-CM | POA: Diagnosis not present

## 2019-11-02 DIAGNOSIS — H353211 Exudative age-related macular degeneration, right eye, with active choroidal neovascularization: Secondary | ICD-10-CM | POA: Diagnosis not present

## 2019-11-02 DIAGNOSIS — H353112 Nonexudative age-related macular degeneration, right eye, intermediate dry stage: Secondary | ICD-10-CM

## 2019-11-02 DIAGNOSIS — H35351 Cystoid macular degeneration, right eye: Secondary | ICD-10-CM | POA: Diagnosis not present

## 2019-11-02 HISTORY — DX: Retinal hemorrhage, right eye: H35.61

## 2019-11-02 MED ORDER — BEVACIZUMAB CHEMO INJECTION 1.25MG/0.05ML SYRINGE FOR KALEIDOSCOPE
1.2500 mg | INTRAVITREAL | Status: AC | PRN
  Administered 2019-11-02: 14:00:00 1.25 mg via INTRAVITREAL

## 2019-11-02 NOTE — Progress Notes (Signed)
11/02/2019     CHIEF COMPLAINT Patient presents for Retina Follow Up   HISTORY OF PRESENT ILLNESS: [name] [name] is a 86 y.o. male who presents to the clinic today for:   HPI    Retina Follow Up    Patient presents with  Wet AMD.  In right eye.  Duration of 8 weeks.  Since onset it is stable.          Comments    8 week follow up - OCT OU, possible Avastin OD Patient denies change in vision and overall has no complaints.        Last edited by Gerda Diss, [place] on 11/02/2019  1:16 PM. (History)      Referring physician: Vernie Shanks, MD [place],  [place] [id number]  HISTORICAL INFORMATION:   Selected notes from the MEDICAL RECORD NUMBER    Lab Results  Component Value Date   HGBA1C 6.3 (H) 03/19/2014     CURRENT MEDICATIONS: No current outpatient medications on file. (Ophthalmic Drugs)   No current facility-administered medications for this visit. (Ophthalmic Drugs)   Current Outpatient Medications (Other)  Medication Sig  . acetaminophen (TYLENOL) 500 MG tablet Take 1,000 mg by mouth every 4 (four) hours as needed.  Marland Kitchen aspirin 81 MG tablet Take 81 mg by mouth daily.  . B Complex-C (SUPER B COMPLEX) TABS Take 1 tablet by mouth daily.  . clindamycin (CLEOCIN) 150 MG capsule TK 4 CS PO PRIOR TO DENTAL APPOINTMENT (Patient not taking: Reported on 11/02/2019)  . finasteride (PROSCAR) 5 MG tablet TAKE 1 TABLET BY MOUTH  DAILY  . ipratropium (ATROVENT) 0.03 % nasal spray Place 2 sprays into both nostrils as needed.  Marland Kitchen levothyroxine (SYNTHROID, LEVOTHROID) 50 MCG tablet Take 1 tablet (50 mcg total) by mouth daily.  Marland Kitchen losartan (COZAAR) 100 MG tablet Take 0.5 tablets (50 mg total) by mouth daily.  . metoprolol tartrate (LOPRESSOR) 25 MG tablet Take 1 tablet (25 mg total) by mouth 2 (two) times daily. (Patient not taking: Reported on 11/02/2019)  . Multiple Vitamins-Minerals (PRESERVISION AREDS 2 PO) Take 1 capsule by mouth 2 (two) times daily.   Marland Kitchen  PARoxetine (PAXIL) 20 MG tablet Take 0.5 tablets (10 mg total) by mouth every morning.  . potassium chloride (MICRO-K) 10 MEQ CR capsule Take 1 capsule (10 mEq total) by mouth daily.  Marland Kitchen torsemide (DEMADEX) 20 MG tablet Take 0.5 tablets (10 mg total) by mouth daily.   No current facility-administered medications for this visit. (Other)      REVIEW OF SYSTEMS:    ALLERGIES Allergies  Allergen Reactions  . Lasix [Furosemide] Swelling    Tongue swelling  . Mucinex [Guaifenesin Er]     Tongue swelling  . Amoxicillin-Pot Clavulanate Itching  . Morphine And Related Nausea And Vomiting  . Penicillins Hives    PAST MEDICAL HISTORY Past Medical History:  Diagnosis Date  . AAA (abdominal aortic aneurysm) ([place])    s/p endovascular repair 2004  . Aortic insufficiency    Prosthetic valve dysfunction  . Aortic stenosis    s/p AVR in 2003, bovine  . Arthritis   . BCC (basal cell carcinoma) 09/17/2012  . BPH (benign prostatic hyperplasia)   . CAD (coronary artery disease)    stents in [place] and has a totally occluded RCA  . Chicken pox   . CVA (cerebral infarction) 12/09  . Depression   . Diverticulosis   . Glaucoma    recently told  by Dr Janyth Contes he did not have it  . HTN (hypertension)   . Hyperlipemia   . Leaky heart valve   . Measles   . Mumps   . Myocardial infarction ([place])   . Personal history of poliomyelitis 09/17/2012   In childhood  . Pneumonia    hx of  . Polio   . Prosthetic valve dysfunction    Aortic stenosis and insufficiency   . Right iliac artery stenosis (HCC)   . 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 Shortness of breath    exertion, orthopnea  . Sleeping difficulties   . Stroke [facility])    no residual  . Thyroid disease    Past Surgical History:  Procedure Laterality Date  . ABDOMINAL AORTIC ANEURYSM REPAIR  2004   s/p endovascular  repiar of AAA in 2004  . AORTIC VALVE REPLACEMENT  2003   #26mm pericardial valve   . APPENDECTOMY    . CARDIAC CATHETERIZATION    . CORONARY ANGIOPLASTY    . CORONARY ARTERY BYPASS GRAFT    . CORONARY STENT PLACEMENT    . INTRAOPERATIVE TRANSESOPHAGEAL ECHOCARDIOGRAM N/A 03/23/2014   Procedure: INTRAOPERATIVE TRANSESOPHAGEAL ECHOCARDIOGRAM;  Surgeon: Sherren Mocha, MD;  Location: [facility] OR;  Service: Open Heart Surgery;  Laterality: N/A;  . JOINT REPLACEMENT    . LEFT AND RIGHT HEART CATHETERIZATION WITH CORONARY ANGIOGRAM N/A 02/08/2014   Procedure: LEFT AND RIGHT HEART CATHETERIZATION WITH CORONARY ANGIOGRAM;  Surgeon: Blane Ohara, MD;  [facility] LAB;  Service: Cardiovascular;  Laterality: N/A;  . TEE WITHOUT CARDIOVERSION N/A 09/02/2013   Procedure: TRANSESOPHAGEAL ECHOCARDIOGRAM (TEE);  Surgeon: Dorothy Spark, MD;  Location: [place];  Service: Cardiovascular;  Laterality: N/A;  . TONSILLECTOMY    . TRANSCATHETER AORTIC VALVE REPLACEMENT, TRANSFEMORAL N/A 03/23/2014   Procedure: TRANSCATHETER AORTIC VALVE REPLACEMENT, TRANSFEMORAL;  Surgeon: Sherren Mocha, MD;  Location: [place];  Service: Open Heart Surgery;  Laterality: N/A;  valve in valve TAVR  . WISDOM TOOTH EXTRACTION      FAMILY HISTORY Family History  Problem Relation Age of Onset  . Colon cancer Mother   . Cancer Mother   . Aneurysm Father   . Heart attack Brother        CHF  . Prostate cancer Brother   . Cancer Brother   . Heart disease Brother   . Cancer Daughter     SOCIAL HISTORY Social History   Tobacco Use  . Smoking status: Former Smoker    Packs/day: 0.50    Years: 30.00    Pack years: 15.00    Types: Cigarettes    Quit date: 10/04/1980    Years since quitting: 39.1  . Smokeless tobacco: Never Used  Substance Use Topics  . Alcohol use: Yes    Alcohol/week: 7.0 standard drinks    Types: 7 Shots of liquor per week    Comment: cocktails/night  . Drug use: No         OPHTHALMIC  EXAM:  Base Eye Exam    Visual Acuity (Snellen - Linear)      Right Left   Dist cc 20/25-2 20/30-2   Dist ph cc  NI   Correction: Glasses       Tonometry (Tonopen, 1:21 PM)      Right Left   Pressure 16 14       Pupils  Pupils Dark Light Shape React APD   Right PERRL 5 4 Round Brisk None   Left PERRL 5 4 Round Brisk None       Visual Fields (Counting fingers)      Left Right    Full Full       Extraocular Movement      Right Left    Full Full       Neuro/Psych    Oriented x3: Yes   Mood/Affect: Normal       Dilation    Right eye: 1.0% Mydriacyl, 2.5% Phenylephrine @ 1:21 PM        Slit Lamp and Fundus Exam    External Exam      Right Left   External Normal Normal       Slit Lamp Exam      Right Left   Lids/Lashes Normal Normal   Conjunctiva/Sclera White and quiet White and quiet   Cornea Clear Clear   Anterior Chamber Deep and quiet Deep and quiet   Iris Round and reactive Round and reactive   Lens Posterior chamber intraocular lens Posterior chamber intraocular lens   Anterior Vitreous Normal Normal       Fundus Exam      Right Left   Disc Normal    C/D Ratio 0.6    Macula Age related macular degeneration, Atrophy, Early age related macular degeneration, Advanced age related macular degeneration, [place]    Vessels Normal    Periphery Normal           IMAGING AND PROCEDURES  Imaging and Procedures for @TODAY @  OCT, Retina - OU - Both Eyes       Right Eye Quality was good. Scan locations included subfoveal. Findings include abnormal foveal contour, retinal drusen , outer retinal atrophy, central retinal atrophy, no IRF.   Left Eye Quality was good. Scan locations included subfoveal. Findings include abnormal foveal contour, retinal drusen .   Notes OD much less active CN VM.Marland Kitchen Stable at 7 to 8-week interval exam OD.  Repeat Avastin OD today.  Left eye with old parapapillary CN VM and old CME which is stable over months.   Observe       Intravitreal Injection, Pharmacologic Agent - OD - Right Eye       Time Out 11/02/2019. 2:16 PM. Confirmed correct patient, procedure, site, and patient consented.   Anesthesia Topical anesthesia was used. Anesthetic medications included Akten 3.5%.   Procedure Preparation included 5% betadine to ocular surface, 10% betadine to eyelids, Ofloxacin . A 30 gauge needle was used.   Injection:  1.25 mg Bevacizumab (AVASTIN) SOLN   NDC: [id number], [id number]   Route: Intravitreal, Site: Right Eye, Waste: 0 mg  Post-op Post injection exam found visual acuity of at least counting fingers. The patient tolerated the procedure well. There were no complications. The patient received written and verbal post procedure care education. Post injection medications were not given.                 ASSESSMENT/PLAN:  @PROBAPNOTE @    ICD-10-CM   1. 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 Cystoid macular edema of right eye  H35.351   3. Retinal hemorrhage of right eye  H35.61   4. Intermediate stage nonexudative  age-related macular degeneration of right eye  H35.3112     1.  2.  3.  Ophthalmic Meds Ordered this visit:  Meds ordered this encounter  Medications  . Bevacizumab (AVASTIN) SOLN 1.25 mg       Return for DILATE OU, AVASTIN OCT, OD.  There are no Patient Instructions on file for this visit.   Explained the diagnoses, plan, and follow up with the patient and they expressed understanding.  Patient expressed understanding of the importance of proper follow up care.   Clent Demark Damarion Mendizabal M.D. Diseases & Surgery of the Retina and Vitreous Retina & Diabetic [place] @TODAY @     Abbreviations: M myopia (nearsighted); A astigmatism; H hyperopia  (farsighted); P presbyopia; Mrx spectacle prescription;  CTL contact lenses; OD right eye; OS left eye; OU both eyes  XT exotropia; ET esotropia; PEK punctate epithelial keratitis; PEE punctate epithelial erosions; DES dry eye syndrome; MGD meibomian gland dysfunction; ATs artificial tears; PFAT's preservative free artificial tears; [place] nuclear sclerotic cataract; PSC posterior subcapsular cataract; ERM epi-retinal membrane; PVD posterior vitreous detachment; RD retinal detachment; DM diabetes mellitus; DR diabetic retinopathy; NPDR non-proliferative diabetic retinopathy; PDR proliferative diabetic retinopathy; CSME clinically significant macular edema; DME diabetic macular edema; dbh dot blot hemorrhages; CWS cotton wool spot; POAG primary open angle glaucoma; C/D cup-to-disc ratio; HVF humphrey visual field; GVF goldmann visual field; OCT optical coherence tomography; IOP intraocular pressure; BRVO Branch retinal vein occlusion; CRVO central retinal vein occlusion; CRAO central retinal artery occlusion; BRAO branch retinal artery occlusion; RT retinal tear; SB scleral buckle; PPV pars plana vitrectomy; VH Vitreous hemorrhage; PRP panretinal laser photocoagulation; IVK intravitreal kenalog; VMT vitreomacular traction; MH Macular hole;  NVD neovascularization of the disc; NVE neovascularization elsewhere; AREDS age related eye disease study; ARMD age related macular degeneration; POAG primary open angle glaucoma; EBMD epithelial/anterior basement membrane dystrophy; ACIOL anterior chamber intraocular lens; IOL intraocular lens; PCIOL posterior chamber intraocular lens; Phaco/IOL phacoemulsification with intraocular lens placement; [place] photorefractive keratectomy; LASIK laser assisted in situ keratomileusis; HTN hypertension; DM diabetes mellitus; COPD chronic obstructive pulmonary disease

## 2019-11-02 NOTE — Assessment & Plan Note (Signed)

## 2019-11-02 NOTE — Assessment & Plan Note (Signed)
The nature of dry age related macular degeneration was discussed with the patient as well as its possible conversion to wet. The results of the AREDS 2 study was discussed with the patient. A diet rich in dark leafy green vegetables was advised and specific recommendations were made regarding supplements with AREDS 2 formulation . Control of hypertension and serum cholesterol may slow the disease. Smoking cessation is mandatory to slow the disease and diminish the risk of progressing to wet age related macular degeneration. The patient was instructed in the use of an [place] Grid and was told to return immediately for any changes in the Grid. Stressed to the patient do not rub eyes  The nature of age realated macular degeneration (ARMD)is explained as follows: The dry form refers to the progressive loss of normal blood supply to the central vision as a result of a combination of factors which include aging blood supply (arteriosclerosis, hardening of the arteries), genetics, smoking habits, and history of hypertension. Currently, no eye medications or vitamins slow this type of aging effect upon vision, however cessation of smoking and controlling hypertension help slow the disorder. The following analogy helps explain this: I describe the dry form of ARMD like a house of the same age as your eyes, which shows typical wear and tear of age upon the house structure and appearance. Like the aging house which can fall down structurally, the dry form of ARMD can deteriorate the structure of the macula (center) of the retina, most often gradually and affect central vision tasks such as reading and driving. The wet form of ARMD refers to the development of abnormally growing blood vessels usually near or under the central vision, with potential risk of permanent visual changes or vision losses. This complication of the Dry form of ARMD may be moderately reduced by use of AREDS 2 formula multivitamins daily. I describe the  Wet form of ARMD as like the development of a fire in an aging house (DRY ARMD). It may develop suddenly, progress rapidly and be destructive based on where it starts and how big the fire is when found. In the eye, the house fire analogy refers to the abnormal blood vessels growing destructively near the central vison in the retina, or film of the eye. Halting the growth of blood vessels with laser (hot or cold) or injectable medications is best way "to put the fire out". Many patients will experience a stabilization or even improvement in vision with a treatment course, while others may still face a loss of vision. The use of injectable medications has revolutionized therapy and is currently the only proven therapy to provide the chance of stable or improved acuity from new and recent destructive wet ARMD. 

## 2019-11-03 ENCOUNTER — Other Ambulatory Visit: Payer: Self-pay

## 2019-11-03 MED ORDER — TORSEMIDE 20 MG PO TABS: 10.0000 mg | ORAL_TABLET | Freq: Every day | ORAL | 2 refills | Status: DC

## 2019-12-09 ENCOUNTER — Other Ambulatory Visit (HOSPITAL_COMMUNITY): Payer: Medicare Other

## 2019-12-09 ENCOUNTER — Ambulatory Visit: Payer: Medicare Other

## 2019-12-23 NOTE — Progress Notes (Deleted)
CARDIOLOGY OFFICE NOTE  Date:  12/23/2019    [name] Date of Birth: 1922/09/30 Medical Record [id number]  PCP:  Vernie Shanks, MD  Cardiologist:  Jerel Shepherd  No chief complaint on file.   History of Present Illness: JANSSEN PULS is a 86 y.o. male who presents today for a follow up visit. Seen for Dr. Burt Knack.Former patient of Dr. Susa Simmonds.Primarily follows with me.  He has CAD with remote inferior MI, past stent to the LCX and has a totally occluded RCA. Had prior AVR by Dr. Cyndia Bent in 2003, past AAA repair in 2004, PVD, past CVA, HLD, BPH and hypothyroidism. He developed severe bioprosthetic valve aortic insufficiency with worsening heart failure and was ultimately treated with valve and valve TAVR using a 23 mm Edwards Sapian XT valve via a transfemoral approach on 03/23/2014.  I have seen him multiple times over the past few years -hiswifehasdied. He took this pretty hard.He has had some intermittent bouts with volume overload. Last echo with more AS - reviewed with Dr. Burt Knack - managed conservatively now. General decline overall. He has been resistant to have help at home. He has stopped driving. Have changed him over to Torsemide due to concern for tongue swelling with Lasix.   Last seen in December - overall stable but with some general decline.   The patient {does/does not:200015} have symptoms concerning for COVID-19 infection (fever, chills, cough, or new shortness of breath).   Comes in today. Here with   Past Medical History:  Diagnosis Date  . AAA (abdominal aortic aneurysm) ([place])    s/p endovascular repair 2004  . Aortic insufficiency    Prosthetic valve dysfunction  . Aortic stenosis    s/p AVR in 2003, bovine  . Arthritis   . BCC (basal cell carcinoma) 09/17/2012  . BPH (benign prostatic hyperplasia)   . CAD (coronary artery disease)    stents in [place] and has a totally occluded RCA  . Chicken pox   . CVA (cerebral infarction)  12/09  . Depression   . Diverticulosis   . Glaucoma    recently told by Dr Janyth Contes he did not have it  . HTN (hypertension)   . Hyperlipemia   . Leaky heart valve   . Measles   . Mumps   . Myocardial infarction ([place])   . Personal history of poliomyelitis 09/17/2012   In childhood  . Pneumonia    hx of  . Polio   . Prosthetic valve dysfunction    Aortic stenosis and insufficiency   . Right iliac artery stenosis (HCC)   . 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 Shortness of breath    exertion, orthopnea  . Sleeping difficulties   . Stroke [facility])    no residual  . Thyroid disease     Past Surgical History:  Procedure Laterality Date  . ABDOMINAL AORTIC ANEURYSM REPAIR  2004   s/p endovascular repiar of AAA in 2004  . AORTIC VALVE REPLACEMENT  2003   #71mm pericardial valve   . APPENDECTOMY    . CARDIAC CATHETERIZATION    . CORONARY ANGIOPLASTY    . CORONARY ARTERY BYPASS GRAFT    . CORONARY STENT PLACEMENT    . INTRAOPERATIVE TRANSESOPHAGEAL ECHOCARDIOGRAM N/A 03/23/2014   Procedure: INTRAOPERATIVE TRANSESOPHAGEAL ECHOCARDIOGRAM;  Surgeon: Sherren Mocha, MD;  Location: [facility] OR;  Service: Open Heart Surgery;  Laterality: N/A;  . JOINT REPLACEMENT    . LEFT AND RIGHT HEART CATHETERIZATION WITH CORONARY ANGIOGRAM N/A 02/08/2014   Procedure: LEFT AND RIGHT HEART CATHETERIZATION WITH CORONARY ANGIOGRAM;  Surgeon: Blane Ohara, MD;  Location: [facility] CATH LAB;  Service: Cardiovascular;  Laterality: N/A;  . TEE WITHOUT CARDIOVERSION N/A 09/02/2013   Procedure: TRANSESOPHAGEAL ECHOCARDIOGRAM (TEE);  Surgeon: Dorothy Spark, MD;  Location: [place];  Service: Cardiovascular;  Laterality: N/A;  . TONSILLECTOMY    . TRANSCATHETER AORTIC VALVE REPLACEMENT, TRANSFEMORAL N/A 03/23/2014   Procedure: TRANSCATHETER AORTIC VALVE REPLACEMENT, TRANSFEMORAL;  Surgeon: Sherren Mocha, MD;  Location: [place];  Service: Open Heart Surgery;  Laterality: N/A;  valve in valve TAVR  . WISDOM TOOTH EXTRACTION       Medications: No outpatient medications have been marked as taking for the 01/06/20 encounter (Appointment) with Burtis Junes, NP.     Allergies: Allergies  Allergen Reactions  . Lasix [Furosemide] Swelling    Tongue swelling  . Mucinex [Guaifenesin Er]     Tongue swelling  . Amoxicillin-Pot Clavulanate Itching  . Morphine And Related Nausea And Vomiting  . Penicillins Hives    Social History: The patient  reports that he quit smoking about 39 years ago. His smoking use included cigarettes. He has a 15.00 pack-year smoking history. He has never used smokeless tobacco. He reports current alcohol use of about 7.0 standard drinks of alcohol per week. He reports that he does not use drugs.   Family History: The patient's ***family history includes Aneurysm in his father; Cancer in his brother, daughter, and mother; Colon cancer in his mother; Heart attack in his brother; Heart disease in his brother; Prostate cancer in his brother.   Review of Systems: Please see the history of present illness.   All other systems are reviewed and negative.   Physical Exam: VS:  There were no vitals taken for this visit. Marland Kitchen  BMI There is no height or weight on file to calculate BMI.  Wt Readings from Last 3 Encounters:  06/24/19 196 lb 1.9 oz (89 kg)  03/10/19 193 lb (87.5 kg)  01/14/19 193 lb 12.8 oz (87.9 kg)    General: Pleasant. Well developed, well nourished and in no acute distress.   HEENT: Normal.  Neck: Supple, no JVD, carotid bruits, or masses noted.  Cardiac: ***Regular rate and rhythm. No murmurs, rubs, or gallops. No edema.  Respiratory:  Lungs are clear to auscultation bilaterally with normal work of breathing.  GI: Soft and nontender.  MS: No deformity or atrophy. Gait and ROM intact.  Skin: Warm and dry. Color is normal.  Neuro:  Strength and  sensation are intact and no gross focal deficits noted.  Psych: Alert, appropriate and with normal affect.   LABORATORY DATA:  EKG:  EKG {ACTION; IS/IS [id number] ordered today.  Personally reviewed by me. This demonstrates ***.  Lab Results  Component Value Date   WBC 8.9 01/14/2019   HGB 15.0 01/14/2019   HCT 43.5 01/14/2019   PLT 139 (L) 01/14/2019   GLUCOSE 82 06/24/2019   CHOL 177 01/14/2019   TRIG 145 01/14/2019   HDL 39 (L) 01/14/2019   LDLCALC 109 (H) 01/14/2019   ALT 22 01/14/2019   AST 27 01/14/2019   NA 144 06/24/2019   K 4.5 06/24/2019   CL 105 06/24/2019   CREATININE 1.39 (H) 06/24/2019   BUN 26 06/24/2019   CO2 28 06/24/2019   TSH 4.420 01/14/2019   INR  1.25 03/23/2014   HGBA1C 6.3 (H) 03/19/2014     BNP (last 3 results) No results for input(s): BNP in the last 8760 hours.  ProBNP (last 3 results) Recent Labs    01/14/19 1146  PROBNP 2,854*     Other Studies Reviewed Today:  ECHO Study Conclusions 10/2016  - Left ventricle: The cavity size was normal. Wall thickness was increased in a pattern of moderate LVH. Systolic function was normal. The estimated ejection fraction was in the range of 50% to 55%. Wall motion was normal; there were no regional wall motion abnormalities. Doppler parameters are consistent with abnormal left ventricular relaxation (grade 1 diastolic dysfunction). Doppler parameters are consistent with high ventricular filling pressure. - Aortic valve: A transcatheter aortic valve bioprosthesis was present. There was severe stenosis. Peak velocity (S): 413 cm/s. Mean gradient (S): 41 mm Hg. Valve area (VTI): 0.59 cm^2. Valve area (Vmax): 0.53 cm^2. Valve area (Vmean): 0.57 cm^2. - Mitral valve: Transvalvular velocity was within the normal range. There was no evidence for stenosis. There was mild regurgitation. - Right ventricle: The cavity size was normal. Wall thickness was normal. Systolic  function was normal. - Atrial septum: No defect or patent foramen ovale was identified by color flow Doppler. - Tricuspid valve: There was trivial regurgitation. - Pulmonary arteries: Systolic pressure was within the normal range. PA peak pressure: 15 mm Hg (S).  Impressions:  - There is a TAVR bioprosthesis inside a bioprosthetic aortic valve. Since the echo 10/27/15, the mean gradient of the TAVR has increased from 35 mmHg to 41 mmHg, conistent with severe stenosis.    ASSESSMENT & PLAN:   1. Aortic valve disease s/p valve-in-valve TAVR: He has had progressive symptoms - prior echo with worsening AS - no further interventions are warranted - conservative management. Back on low dose diuretic. Seems stable at this time but overall tenuous.   2. HTN - BP ok here today - would only change his medicines if consistently running high.   3. HLD - ok to stop his statin.   4. CAD - no active chest pain - would favor conservative management. Rechecking BMET and CBC today.   5. Advanced age - turning 25 later this month. His back is bothering him - he is not a candidate for surgery - would favor conservative management.      Current medicines are reviewed with the patient today.  The patient does not have concerns regarding medicines other than what has been noted above.  The following changes have been made:  See above.  Labs/ tests ordered today include:   No orders of the defined types were placed in this encounter.    Disposition:   FU with *** in {gen number [id number] {Days to years:10300}.   Patient is agreeable to this plan and will call if any problems develop in the interim.   SignedTruitt Merle, NP  12/23/2019 7:51 AM  [place] [address] [place], [place]  [address] Phone: [phone] Fax: [phone]

## 2019-12-28 ENCOUNTER — Ambulatory Visit (INDEPENDENT_AMBULATORY_CARE_PROVIDER_SITE_OTHER): Payer: Medicare Other | Admitting: Ophthalmology

## 2019-12-28 ENCOUNTER — Encounter (INDEPENDENT_AMBULATORY_CARE_PROVIDER_SITE_OTHER): Payer: Self-pay | Admitting: Ophthalmology

## 2019-12-28 ENCOUNTER — Other Ambulatory Visit: Payer: Self-pay

## 2019-12-28 DIAGNOSIS — H35351 Cystoid macular degeneration, right eye: Secondary | ICD-10-CM | POA: Diagnosis not present

## 2019-12-28 DIAGNOSIS — H353112 Nonexudative age-related macular degeneration, right eye, intermediate dry stage: Secondary | ICD-10-CM

## 2019-12-28 DIAGNOSIS — H353211 Exudative age-related macular degeneration, right eye, with active choroidal neovascularization: Secondary | ICD-10-CM

## 2019-12-28 DIAGNOSIS — H353133 Nonexudative age-related macular degeneration, bilateral, advanced atrophic without subfoveal involvement: Secondary | ICD-10-CM | POA: Insufficient documentation

## 2019-12-28 MED ORDER — BEVACIZUMAB CHEMO INJECTION 1.25MG/0.05ML SYRINGE FOR KALEIDOSCOPE
1.2500 mg | INTRAVITREAL | Status: AC | PRN
  Administered 2019-12-28: 1.25 mg via INTRAVITREAL

## 2019-12-28 NOTE — Progress Notes (Signed)
12/28/2019     CHIEF COMPLAINT Patient presents for Retina Follow Up   HISTORY OF PRESENT ILLNESS: [name] [name] is a 86 y.o. male who presents to the clinic today for:   HPI    Retina Follow Up    Patient presents with  Wet AMD.  In right eye.  Duration of 8 weeks.  Since onset it is stable.          Comments    8 week follow up - OCT OU, Possible Avastin OD Patient denies change in vision and overall has no complaints.        Last edited by Gerda Diss on 12/28/2019  1:23 PM. (History)      Referring physician: Vernie Shanks, MD [place],  [place] [address]  HISTORICAL INFORMATION:   Selected notes from the MEDICAL RECORD NUMBER    Lab Results  Component Value Date   HGBA1C 6.3 (H) 03/19/2014     CURRENT MEDICATIONS: No current outpatient medications on file. (Ophthalmic Drugs)   No current facility-administered medications for this visit. (Ophthalmic Drugs)   Current Outpatient Medications (Other)  Medication Sig  . acetaminophen (TYLENOL) 500 MG tablet Take 1,000 mg by mouth every 4 (four) hours as needed.  Marland Kitchen aspirin 81 MG tablet Take 81 mg by mouth daily.  . B Complex-C (SUPER B COMPLEX) TABS Take 1 tablet by mouth daily.  . clindamycin (CLEOCIN) 150 MG capsule TK 4 CS PO PRIOR TO DENTAL APPOINTMENT (Patient not taking: Reported on 11/02/2019)  . finasteride (PROSCAR) 5 MG tablet TAKE 1 TABLET BY MOUTH  DAILY  . ipratropium (ATROVENT) 0.03 % nasal spray Place 2 sprays into both nostrils as needed.  Marland Kitchen levothyroxine (SYNTHROID, LEVOTHROID) 50 MCG tablet Take 1 tablet (50 mcg total) by mouth daily.  Marland Kitchen losartan (COZAAR) 100 MG tablet Take 0.5 tablets (50 mg total) by mouth daily.  . metoprolol tartrate (LOPRESSOR) 25 MG tablet Take 1 tablet (25 mg total) by mouth 2 (two) times daily. (Patient not taking: Reported on 11/02/2019)  . Multiple Vitamins-Minerals (PRESERVISION AREDS 2 PO) Take 1 capsule by mouth 2 (two) times daily.   Marland Kitchen PARoxetine  (PAXIL) 20 MG tablet Take 0.5 tablets (10 mg total) by mouth every morning.  . potassium chloride (MICRO-K) 10 MEQ CR capsule Take 1 capsule (10 mEq total) by mouth daily.  Marland Kitchen torsemide (DEMADEX) 20 MG tablet Take 0.5 tablets (10 mg total) by mouth daily.   No current facility-administered medications for this visit. (Other)      REVIEW OF SYSTEMS:    ALLERGIES Allergies  Allergen Reactions  . Lasix [Furosemide] Swelling    Tongue swelling  . Mucinex [Guaifenesin Er]     Tongue swelling  . Amoxicillin-Pot Clavulanate Itching  . Morphine And Related Nausea And Vomiting  . Penicillins Hives    PAST MEDICAL HISTORY Past Medical History:  Diagnosis Date  . AAA (abdominal aortic aneurysm) ([place])    s/p endovascular repair 2004  . Aortic insufficiency    Prosthetic valve dysfunction  . Aortic stenosis    s/p AVR in 2003, bovine  . Arthritis   . BCC (basal cell carcinoma) 09/17/2012  . BPH (benign prostatic hyperplasia)   . CAD (coronary artery disease)    stents in [place] and has a totally occluded RCA  . Chicken pox   . CVA (cerebral infarction) 12/09  . Depression   . Diverticulosis   . Glaucoma    recently told by  Dr Janyth Contes he did not have it  . HTN (hypertension)   . Hyperlipemia   . Leaky heart valve   . Measles   . Mumps   . Myocardial infarction ([place])   . Personal history of poliomyelitis 09/17/2012   In childhood  . Pneumonia    hx of  . Polio   . Prosthetic valve dysfunction    Aortic stenosis and insufficiency   . Right iliac artery stenosis (HCC)   . 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 Shortness of breath    exertion, orthopnea  . Sleeping difficulties   . Stroke [facility])    no residual  . Thyroid disease    Past Surgical History:  Procedure Laterality Date  . ABDOMINAL AORTIC ANEURYSM REPAIR  2004   s/p endovascular repiar of AAA in  2004  . AORTIC VALVE REPLACEMENT  2003   #51mm pericardial valve   . APPENDECTOMY    . CARDIAC CATHETERIZATION    . CORONARY ANGIOPLASTY    . CORONARY ARTERY BYPASS GRAFT    . CORONARY STENT PLACEMENT    . INTRAOPERATIVE TRANSESOPHAGEAL ECHOCARDIOGRAM N/A 03/23/2014   Procedure: INTRAOPERATIVE TRANSESOPHAGEAL ECHOCARDIOGRAM;  Surgeon: Sherren Mocha, MD;  Location: [facility] OR;  Service: Open Heart Surgery;  Laterality: N/A;  . JOINT REPLACEMENT    . LEFT AND RIGHT HEART CATHETERIZATION WITH CORONARY ANGIOGRAM N/A 02/08/2014   Procedure: LEFT AND RIGHT HEART CATHETERIZATION WITH CORONARY ANGIOGRAM;  Surgeon: Blane Ohara, MD;  Location: [facility] CATH LAB;  Service: Cardiovascular;  Laterality: N/A;  . TEE WITHOUT CARDIOVERSION N/A 09/02/2013   Procedure: TRANSESOPHAGEAL ECHOCARDIOGRAM (TEE);  Surgeon: Dorothy Spark, MD;  Location: [facility];  Service: Cardiovascular;  Laterality: N/A;  . TONSILLECTOMY    . TRANSCATHETER AORTIC VALVE REPLACEMENT, TRANSFEMORAL N/A 03/23/2014   Procedure: TRANSCATHETER AORTIC VALVE REPLACEMENT, TRANSFEMORAL;  Surgeon: Sherren Mocha, MD;  Location: [facility];  Service: Open Heart Surgery;  Laterality: N/A;  valve in valve TAVR  . WISDOM TOOTH EXTRACTION      FAMILY HISTORY Family History  Problem Relation Age of Onset  . Colon cancer Mother   . Cancer Mother   . Aneurysm Father   . Heart attack Brother        CHF  . Prostate cancer Brother   . Cancer Brother   . Heart disease Brother   . Cancer Daughter     SOCIAL HISTORY Social History   Tobacco Use  . Smoking status: Former Smoker    Packs/day: 0.50    Years: 30.00    Pack years: 15.00    Types: Cigarettes    Quit date: 10/04/1980    Years since quitting: 39.2  . Smokeless tobacco: Never Used  Substance Use Topics  . Alcohol use: Yes    Alcohol/week: 7.0 standard drinks    Types: 7 Shots of liquor per week    Comment: cocktails/night  . Drug use: No         OPHTHALMIC EXAM:  Base Eye  Exam    Visual Acuity (Snellen - Linear)      Right Left   Dist cc 20/30-2 20/30-2   Dist ph cc NI NI       Tonometry (Tonopen, 1:31 PM)      Right Left   Pressure 12 14       Pupils      Pupils Dark Light Shape React  APD   Right PERRL 5 4 Round Brisk None   Left PERRL 5 4 Round Brisk None       Visual Fields (Counting fingers)      Left Right    Full Full       Extraocular Movement      Right Left    Full Full       Neuro/Psych    Oriented x3: Yes   Mood/Affect: Normal       Dilation    Both eyes: 1.0% Mydriacyl, 2.5% Phenylephrine @ 1:31 PM        Slit Lamp and Fundus Exam    External Exam      Right Left   External Normal Normal       Slit Lamp Exam      Right Left   Lids/Lashes Normal Normal   Conjunctiva/Sclera White and quiet White and quiet   Cornea Clear Clear   Anterior Chamber Deep and quiet Deep and quiet   Iris Round and reactive Round and reactive   Lens Posterior chamber intraocular lens Posterior chamber intraocular lens   Anterior Vitreous Normal Normal       Fundus Exam      Right Left   Posterior Vitreous Clear Clear   Disc Normal Normal   C/D Ratio 0.6 0.55   Macula Age related macular degeneration, Atrophy,  Advanced age related macular degeneration, Drusen Age related macular degeneration, Atrophy,  Advanced age related macular degeneration, Drusen   Vessels Normal Normal   Periphery Normal Normal          IMAGING AND PROCEDURES  Imaging and Procedures for 12/28/19  OCT, Retina - OU - Both Eyes       Right Eye Quality was good. Scan locations included subfoveal. Central Foveal Thickness: 283. Progression has improved. Findings include retinal drusen , central retinal atrophy, outer retinal atrophy, inner retinal atrophy, abnormal foveal contour.   Left Eye Quality was good. Scan locations included subfoveal. Central Foveal Thickness: 271. Progression has been stable. Findings include abnormal foveal contour,  subretinal scarring, central retinal atrophy, outer retinal atrophy.        Intravitreal Injection, Pharmacologic Agent - OD - Right Eye       Time Out 12/28/2019. 2:18 PM. Confirmed correct patient, procedure, site, and patient consented.   Anesthesia Topical anesthesia was used. Anesthetic medications included Akten 3.5%.   Procedure Preparation included 10% betadine to eyelids, Ofloxacin . A 30 gauge needle was used.   Injection:  1.25 mg Bevacizumab (AVASTIN) SOLN   NDC: [address]-4, Lot: [id number]   Route: Intravitreal, Site: Right Eye, Waste: 0 mg  Post-op Post injection exam found visual acuity of at least counting fingers. The patient tolerated the procedure well. There were no complications. The patient received written and verbal post procedure care education. Post injection medications were not given.                 ASSESSMENT/PLAN:  No problem-specific Assessment & Plan notes found for this encounter.      ICD-10-CM   1. 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 Intermediate stage nonexudative age-related macular degeneration of right eye  H35.3112   3. Cystoid macular edema of right eye  H35.351   4. Advanced nonexudative age-related  macular degeneration of both eyes without subfoveal involvement  H35.3133     1. Repeat intravitreal Avastin OD today, perifoveal CME remained stable. Current interval of examination is every 8 weeks.  2.  3.  Ophthalmic Meds Ordered this visit:  Meds ordered this encounter  Medications  . Bevacizumab (AVASTIN) SOLN 1.25 mg       Return in about 8 weeks (around 02/22/2020) for dilate, OS, AVASTIN OCT.  There are no Patient Instructions on file for this visit.   Explained the diagnoses, plan, and follow up with  the patient and they expressed understanding.  Patient expressed understanding of the importance of proper follow up care.   Clent Demark Elexis Pollak M.D. Diseases & Surgery of the Retina and Vitreous [facility]ina [facility] Diabetic [place] 12/28/19     Abbreviations: M myopia (nearsighted); A astigmatism; H hyperopia (farsighted); P presbyopia; Mrx spectacle prescription;  CTL contact lenses; OD right eye; OS left eye; OU both eyes  XT exotropia; ET esotropia; PEK punctate epithelial keratitis; PEE punctate epithelial erosions; DES dry eye syndrome; MGD meibomian gland dysfunction; ATs artificial tears; PFAT's preservative free artificial tears; [place] nuclear sclerotic cataract; PSC posterior subcapsular cataract; ERM epi-retinal membrane; PVD posterior vitreous detachment; RD retinal detachment; DM diabetes mellitus; DR diabetic retinopathy; NPDR non-proliferative diabetic retinopathy; PDR proliferative diabetic retinopathy; CSME clinically significant macular edema; DME diabetic macular edema; dbh dot blot hemorrhages; CWS cotton wool spot; POAG primary open angle glaucoma; C/D cup-to-disc ratio; HVF humphrey visual field; GVF goldmann visual field; OCT optical coherence tomography; IOP intraocular pressure; BRVO Branch retinal vein occlusion; CRVO central retinal vein occlusion; CRAO central retinal artery occlusion; BRAO branch retinal artery occlusion; RT retinal tear; SB scleral buckle; PPV pars plana vitrectomy; VH Vitreous hemorrhage; PRP panretinal laser photocoagulation; IVK intravitreal kenalog; VMT vitreomacular traction; MH Macular hole;  NVD neovascularization of the disc; NVE neovascularization elsewhere; AREDS age related eye disease study; ARMD age related macular degeneration; POAG primary open angle glaucoma; EBMD epithelial/anterior basement membrane dystrophy; ACIOL anterior chamber intraocular lens; IOL intraocular lens; PCIOL posterior chamber intraocular lens; Phaco/IOL phacoemulsification with  intraocular lens placement; [place] photorefractive keratectomy; LASIK laser assisted in situ keratomileusis; HTN hypertension; DM diabetes mellitus; COPD chronic obstructive pulmonary disease

## 2020-01-06 ENCOUNTER — Telehealth: Payer: Self-pay | Admitting: Nurse Practitioner

## 2020-01-06 ENCOUNTER — Ambulatory Visit: Payer: Medicare Other | Admitting: Nurse Practitioner

## 2020-01-06 NOTE — Telephone Encounter (Signed)
Agree.   [name] [name] 

## 2020-01-06 NOTE — Telephone Encounter (Signed)
New Message:     Daughter called, pt cx his appt today. He is not feeling well. She would like for Danielle to call her, concerning his medicine please.

## 2020-01-06 NOTE — Telephone Encounter (Signed)
S/w daughter per [facility]) stated pt missed vascular appts.  Canceled Truitt Merle, NP appt today.  Daughter stated pt is SOB.   Stated was 02 checked, no did not have a oximeter, will go buy one today.  Asked pt's weight, stated just big, ankle swelling, eats frozen meals and gets to much salt, will try and weigh today.  Will call pt's daughter tomorrow to get update.  Advised with Cecille Rubin,  pt will increase torsemide two tablets by mouth (40 mg) X 2 days,  than one tablet by mouth, (20 mg) daily and increase potassium one tablet, by mouth, twice daily. Pt needs to be scheduled for a 2 week f/u with labs.  Will send to [facility] to agree.

## 2020-01-07 NOTE — Telephone Encounter (Signed)
Will not change anything based on vitals.  Will call pt on Monday to touch base and schedule f/u.

## 2020-01-07 NOTE — Telephone Encounter (Signed)
Follow Up  Patient's daughter is calling in to speak with Andee Poles and provide vitals for patient.  Oxygen 91 BP 135/70 Pulse 65 Weight 196  Please give patient a call back.

## 2020-01-07 NOTE — Telephone Encounter (Signed)
Called daughter X 2 per phone conversation yesterday, lvm.  Will send to Lenice Llamas, RN to f/u with this.

## 2020-01-11 NOTE — Telephone Encounter (Signed)
S/w pt is doing ok except for back.  Pt "feels like he is in jail" since pt had car taken away.  Pt does not want to move up appt. Will call if needs sooner appt.

## 2020-02-16 NOTE — Progress Notes (Signed)
CARDIOLOGY OFFICE NOTE  Date:  03/01/2020    [name] Date of Birth: [birth year] Medical Record #[id number]  PCP:  Patient, No Pcp Per  Cardiologist:  Servando Snare & Burt Knack   Chief Complaint  Patient presents with  . Follow-up    History of Present Illness: KENYA SHIRAISHI is a 86 y.o. male who presents today for an 8 month check. Seen for Dr. Burt Knack.Former patient of Dr. Susa Simmonds.Primarily follows with me.  He has CAD with remote inferior MI, past stent to the LCX and has a totally occluded RCA. Had prior AVR by Dr. Cyndia Bent in 2003, past AAA repair in 2004, PVD, past CVA, HLD, BPH and hypothyroidism. He developed severe bioprosthetic valve aortic insufficiency with worsening heart failure and was ultimately treated with valve and valve TAVR using a 23 mm Edwards Sapian XT valve via a transfemoral approach on 03/23/2014.  I have seen him multiple times over the past few years -hiswifehasdied. He took this pretty hard.He has had some bouts with volume overload. Last echo with more AS - reviewed with Dr. Burt Knack - managed conservatively now. General decline overall. He hasrefused to get a Lifeline. Kids have beenpretty frustrated with him. Lots of medical issues - tried to get him back to his PCP.His medicines are never correct and unclear.He has had some falls - refuses to get a cane/walker or lifeline.   I saw him for a virtual visit in April of 2020 - seemed to be doing ok for the most part.Then seen as a work in back in June of 2020 - more swelling - falling - SOB. There were lots of concerns. He was not wearing a mask - not staying at home. Not taking his Lasix due to going out and having incontinence.When seen last August - had stopped driving - seemed to be staying home better. Have changed him to Torsemide due to concern for tongue swelling with his Lasix that he said he had chronically (I was just recently made aware of this). Last seen back in December - seemed to  be doing ok but getting wobbly/frail. Sold his car. There is lots of difficulty with him letting his children help him out.   Comes in today. Here with his daughter [name]. He is pretty unhappy. Very unhappy with PCP. He is out of his medicines. Just can't adapt with their way of visits/recommendations - really sounds like it is services associated with his Medicare to me. He is not interested in changing his diet, exercise, etc. Some visit with a pharmacist was over $400 - he is not happy about this. Very upset about the cost. Says he is not going back. Just wants "to die".   He is quite angry today. He did not get vaccinated. She has not either due to her own health issues. Very hard for him to wear a mask. Remains short of breath. Some swelling. Noted some dizziness - apparently has complained to the house keeper recently. His back bothers him considerably. He notes recently he had some scrotal bleeding - noted blood down his legs while sitting on the toilet - he wonders if this was from a prior cath site. Only happened once.   Past Medical History:  Diagnosis Date  . AAA (abdominal aortic aneurysm) ([place])    s/p endovascular repair 2004  . Aortic insufficiency    Prosthetic valve dysfunction  . Aortic stenosis    s/p AVR in 2003, bovine  . Arthritis   .  BCC (basal cell carcinoma) 09/17/2012  . BPH (benign prostatic hyperplasia)   . CAD (coronary artery disease)    stents in [place] and has a totally occluded RCA  . Chicken pox   . CVA (cerebral infarction) 12/09  . Depression   . Diverticulosis   . Glaucoma    recently told by Dr Janyth Contes he did not have it  . HTN (hypertension)   . Hyperlipemia   . Leaky heart valve   . Measles   . Mumps   . Myocardial infarction ([place])   . Personal history of poliomyelitis 09/17/2012   In childhood  . Pneumonia    hx of  . Polio   . Prosthetic valve dysfunction    Aortic stenosis and insufficiency   . Right iliac artery stenosis ([facility])   . S/P TAVR  (transcatheter aortic valve replacement) 03/23/2014   23 mm Edwards Sapien transcatheter heart valve placed via open left transfemoral approach  . Shortness of breath    exertion, orthopnea  . Sleeping difficulties   . Stroke [facility])    no residual  . Thyroid disease     Past Surgical History:  Procedure Laterality Date  . ABDOMINAL AORTIC ANEURYSM REPAIR  2004   s/p endovascular repiar of AAA in 2004  . AORTIC VALVE REPLACEMENT  2003   #25mm pericardial valve   . APPENDECTOMY    . CARDIAC CATHETERIZATION    . CORONARY ANGIOPLASTY    . CORONARY ARTERY BYPASS GRAFT    . CORONARY STENT PLACEMENT    . INTRAOPERATIVE TRANSESOPHAGEAL ECHOCARDIOGRAM N/A 03/23/2014   Procedure: INTRAOPERATIVE TRANSESOPHAGEAL ECHOCARDIOGRAM;  Surgeon: Sherren Mocha, MD;  Location: [facility] OR;  Service: Open Heart Surgery;  Laterality: N/A;  . JOINT REPLACEMENT    . LEFT AND RIGHT HEART CATHETERIZATION WITH CORONARY ANGIOGRAM N/A 02/08/2014   Procedure: LEFT AND RIGHT HEART CATHETERIZATION WITH CORONARY ANGIOGRAM;  Surgeon: Blane Ohara, MD;  Location: [facility] CATH LAB;  Service: Cardiovascular;  Laterality: N/A;  . TEE WITHOUT CARDIOVERSION N/A 09/02/2013   Procedure: TRANSESOPHAGEAL ECHOCARDIOGRAM (TEE);  Surgeon: Dorothy Spark, MD;  Location: [place] [facility];  Service: Cardiovascular;  Laterality: N/A;  . TONSILLECTOMY    . TRANSCATHETER AORTIC VALVE REPLACEMENT, TRANSFEMORAL N/A 03/23/2014   Procedure: TRANSCATHETER AORTIC VALVE REPLACEMENT, TRANSFEMORAL;  Surgeon: Sherren Mocha, MD;  Location: [place];  Service: Open Heart Surgery;  Laterality: N/A;  valve in valve TAVR  . WISDOM TOOTH EXTRACTION       Medications: Current Meds  Medication Sig  . acetaminophen (TYLENOL) 500 MG tablet Take 1,000 mg by mouth every 4 (four) hours as needed.  Marland Kitchen aspirin 81 MG tablet Take 81 mg by mouth daily.  . B Complex-C (SUPER B COMPLEX) TABS Take 1 tablet by mouth daily.  . clindamycin (CLEOCIN) 150 MG capsule TK 4 CS PO PRIOR  TO DENTAL APPOINTMENT  . finasteride (PROSCAR) 5 MG tablet TAKE 1 TABLET BY MOUTH  DAILY  . furosemide (LASIX) 40 MG tablet Take 20 mg by mouth daily.  Marland Kitchen ipratropium (ATROVENT) 0.03 % nasal spray Place 2 sprays into both nostrils as needed.  Marland Kitchen levothyroxine (SYNTHROID) 50 MCG tablet Take 1 tablet (50 mcg total) by mouth daily.  . metoprolol tartrate (LOPRESSOR) 25 MG tablet Take 1 tablet (25 mg total) by mouth 2 (two) times daily.  . Multiple Vitamins-Minerals (PRESERVISION AREDS 2 PO) Take 1 capsule by mouth 2 (two) times daily.   Marland Kitchen PARoxetine (PAXIL) 20 MG tablet Take 0.5 tablets (10 mg total)  by mouth every morning.  . potassium chloride (MICRO-K) 10 MEQ CR capsule Take 1 capsule (10 mEq total) by mouth daily.  Marland Kitchen torsemide (DEMADEX) 20 MG tablet Take 0.5 tablets (10 mg total) by mouth daily.  . [DISCONTINUED] levothyroxine (SYNTHROID) 50 MCG tablet Take 1 tablet (50 mcg total) by mouth daily.  . [DISCONTINUED] levothyroxine (SYNTHROID, LEVOTHROID) 50 MCG tablet Take 1 tablet (50 mcg total) by mouth daily.  . [DISCONTINUED] losartan (COZAAR) 100 MG tablet Take 0.5 tablets (50 mg total) by mouth daily.  . [DISCONTINUED] PARoxetine (PAXIL) 20 MG tablet Take 0.5 tablets (10 mg total) by mouth every morning.  . [DISCONTINUED] PARoxetine (PAXIL) 20 MG tablet Take 0.5 tablets (10 mg total) by mouth every morning.     Allergies: Allergies  Allergen Reactions  . Lasix [Furosemide] Swelling    Tongue swelling  . Mucinex [Guaifenesin Er]     Tongue swelling  . Amoxicillin-Pot Clavulanate Itching  . Morphine And Related Nausea And Vomiting  . Penicillins Hives    Social History: The patient  reports that he quit smoking about 39 years ago. His smoking use included cigarettes. He has a 15.00 pack-year smoking history. He has never used smokeless tobacco. He reports current alcohol use of about 7.0 standard drinks of alcohol per week. He reports that he does not use drugs.   Family  History: The patient's family history includes Aneurysm in his father; Cancer in his brother, daughter, and mother; Colon cancer in his mother; Heart attack in his brother; Heart disease in his brother; Prostate cancer in his brother.   Review of Systems: Please see the history of present illness.   All other systems are reviewed and negative.   Physical Exam: VS:  Pulse 72   Ht 5\' 6"  (1.676 m)   Wt 198 lb (89.8 kg)   SpO2 91%   BMI 31.96 kg/m  .  BMI Body mass index is 31.96 kg/m.  Wt Readings from Last 3 Encounters:  03/01/20 198 lb (89.8 kg)  06/24/19 196 lb 1.9 oz (89 kg)  03/10/19 193 lb (87.5 kg)   BP is 90 palpated on the right and 80 palpated on the left by me.   General: He is rather angry today. He is in no acute distress.   Cardiac: Regular rate and rhythm. Harsh murmur of AS. Very little edema.  Respiratory:  Lungs are clear to auscultation bilaterally with normal work of breathing.  GI: Soft and nontender.  MS: No deformity or atrophy. Gait and ROM intact.  Skin: Warm and dry. Color is normal.  Neuro:  Strength and sensation are intact and no gross focal deficits noted.  Psych: Alert, appropriate and with normal affect.   LABORATORY DATA:  EKG:  EKG is ordered today.  Personally reviewed by me. This demonstrates NSR with 1st degree AV block - HR is 72.  Lab Results  Component Value Date   WBC 8.9 01/14/2019   HGB 15.0 01/14/2019   HCT 43.5 01/14/2019   PLT 139 (L) 01/14/2019   GLUCOSE 82 06/24/2019   CHOL 177 01/14/2019   TRIG 145 01/14/2019   HDL 39 (L) 01/14/2019   LDLCALC 109 (H) 01/14/2019   ALT 22 01/14/2019   AST 27 01/14/2019   NA 144 06/24/2019   K 4.5 06/24/2019   CL 105 06/24/2019   CREATININE 1.39 (H) 06/24/2019   BUN 26 06/24/2019   CO2 28 06/24/2019   TSH 4.420 01/14/2019   INR 1.25 03/23/2014  HGBA1C 6.3 (H) 03/19/2014     BNP (last 3 results) No results for input(s): BNP in the last 8760 hours.  ProBNP (last 3 results) No  results for input(s): PROBNP in the last 8760 hours.   Other Studies Reviewed Today:  ECHO Study Conclusions 10/2016  - Left ventricle: The cavity size was normal. Wall thickness was increased in a pattern of moderate LVH. Systolic function was normal. The estimated ejection fraction was in the range of 50% to 55%. Wall motion was normal; there were no regional wall motion abnormalities. Doppler parameters are consistent with abnormal left ventricular relaxation (grade 1 diastolic dysfunction). Doppler parameters are consistent with high ventricular filling pressure. - Aortic valve: A transcatheter aortic valve bioprosthesis was present. There was severe stenosis. Peak velocity (S): 413 cm/s. Mean gradient (S): 41 mm Hg. Valve area (VTI): 0.59 cm^2. Valve area (Vmax): 0.53 cm^2. Valve area (Vmean): 0.57 cm^2. - Mitral valve: Transvalvular velocity was within the normal range. There was no evidence for stenosis. There was mild regurgitation. - Right ventricle: The cavity size was normal. Wall thickness was normal. Systolic function was normal. - Atrial septum: No defect or patent foramen ovale was identified by color flow Doppler. - Tricuspid valve: There was trivial regurgitation. - Pulmonary arteries: Systolic pressure was within the normal range. PA peak pressure: 15 mm Hg (S).  Impressions:  - There is a TAVR bioprosthesis inside a bioprosthetic aortic valve. Since the echo 10/27/15, the mean gradient of the TAVR has increased from 35 mmHg to 41 mmHg, conistent with severe stenosis.    ASSESSMENT & PLAN:   1. Marked hypotension - unclear why. Stopping ARB today. Lab today.   2. Advanced age - he is really not happy - just wants to die.   3. Aortic valve disease s/p valve in valve TAVR - no further interventions are warranted - I see no need for follow up echo and he is not interested in further testing.   4. HTN - see above.     5. HLD - no longer on statin - no need to recheck.   6. Hypothyroid - I have refilled his Synthroid today.   7. Depression - I have refilled the Paxil today.   8. Chronic back pain - he is not a candidate for any surgery - favor conservative measures.   9. COVID - no plans to get vaccinated - not masking consistently.   Current medicines are reviewed with the patient today.  The patient does not have concerns regarding medicines other than what has been noted above.  The following changes have been made:  See above.  Labs/ tests ordered today include:    Orders Placed This Encounter  Procedures  . Basic metabolic panel  . CBC  . Hepatic function panel  . TSH  . EKG 12-Lead     Disposition:   FU with me in a month to recheck his BP - stopping ARB today.    Patient is agreeable to this plan and will call if any problems develop in the interim.   SignedTruitt Merle, NP  03/01/2020 3:46 PM  [facility] [address] [place], [place]  [address] Phone: [phone] Fax: [phone]

## 2020-02-22 ENCOUNTER — Encounter (INDEPENDENT_AMBULATORY_CARE_PROVIDER_SITE_OTHER): Payer: Self-pay | Admitting: Ophthalmology

## 2020-02-22 ENCOUNTER — Other Ambulatory Visit: Payer: Self-pay

## 2020-02-22 ENCOUNTER — Ambulatory Visit (INDEPENDENT_AMBULATORY_CARE_PROVIDER_SITE_OTHER): Payer: Medicare Other | Admitting: Ophthalmology

## 2020-02-22 DIAGNOSIS — H353211 Exudative age-related macular degeneration, right eye, with active choroidal neovascularization: Secondary | ICD-10-CM

## 2020-02-22 MED ORDER — BEVACIZUMAB CHEMO INJECTION 1.25MG/0.05ML SYRINGE FOR KALEIDOSCOPE
1.2500 mg | INTRAVITREAL | Status: AC | PRN
  Administered 2020-02-22: 1.25 mg via INTRAVITREAL

## 2020-02-22 NOTE — Progress Notes (Signed)
02/22/2020     CHIEF COMPLAINT Patient presents for Retina Follow Up   HISTORY OF PRESENT ILLNESS: [name] [name] is a 86 y.o. male who presents to the clinic today for:   HPI    Retina Follow Up    Patient presents with  Wet AMD.  In left eye.  Duration of 8 weeks.  Since onset it is stable.          Comments    8 week follow up - OCT OU, Poss Avastin OS Patient denies change in vision and overall has no complaints.        Last edited by Gerda Diss on 02/22/2020  1:12 PM. (History)      Referring physician: Vernie Shanks, MD [place],  [place] [address]  HISTORICAL INFORMATION:   Selected notes from the MEDICAL RECORD NUMBER    Lab Results  Component Value Date   HGBA1C 6.3 (H) 03/19/2014     CURRENT MEDICATIONS: No current outpatient medications on file. (Ophthalmic Drugs)   No current facility-administered medications for this visit. (Ophthalmic Drugs)   Current Outpatient Medications (Other)  Medication Sig  . acetaminophen (TYLENOL) 500 MG tablet Take 1,000 mg by mouth every 4 (four) hours as needed.  Marland Kitchen aspirin 81 MG tablet Take 81 mg by mouth daily.  . B Complex-C (SUPER B COMPLEX) TABS Take 1 tablet by mouth daily.  . clindamycin (CLEOCIN) 150 MG capsule TK 4 CS PO PRIOR TO DENTAL APPOINTMENT (Patient not taking: Reported on 11/02/2019)  . finasteride (PROSCAR) 5 MG tablet TAKE 1 TABLET BY MOUTH  DAILY  . ipratropium (ATROVENT) 0.03 % nasal spray Place 2 sprays into both nostrils as needed.  Marland Kitchen levothyroxine (SYNTHROID, LEVOTHROID) 50 MCG tablet Take 1 tablet (50 mcg total) by mouth daily.  Marland Kitchen losartan (COZAAR) 100 MG tablet Take 0.5 tablets (50 mg total) by mouth daily.  . metoprolol tartrate (LOPRESSOR) 25 MG tablet Take 1 tablet (25 mg total) by mouth 2 (two) times daily. (Patient not taking: Reported on 11/02/2019)  . Multiple Vitamins-Minerals (PRESERVISION AREDS 2 PO) Take 1 capsule by mouth 2 (two) times daily.   Marland Kitchen PARoxetine (PAXIL)  20 MG tablet Take 0.5 tablets (10 mg total) by mouth every morning.  . potassium chloride (MICRO-K) 10 MEQ CR capsule Take 1 capsule (10 mEq total) by mouth daily.  Marland Kitchen torsemide (DEMADEX) 20 MG tablet Take 0.5 tablets (10 mg total) by mouth daily.   No current facility-administered medications for this visit. (Other)      REVIEW OF SYSTEMS:    ALLERGIES Allergies  Allergen Reactions  . Lasix [Furosemide] Swelling    Tongue swelling  . Mucinex [Guaifenesin Er]     Tongue swelling  . Amoxicillin-Pot Clavulanate Itching  . Morphine And Related Nausea And Vomiting  . Penicillins Hives    PAST MEDICAL HISTORY Past Medical History:  Diagnosis Date  . AAA (abdominal aortic aneurysm) ([place])    s/p endovascular repair 2004  . Aortic insufficiency    Prosthetic valve dysfunction  . Aortic stenosis    s/p AVR in 2003, bovine  . Arthritis   . BCC (basal cell carcinoma) 09/17/2012  . BPH (benign prostatic hyperplasia)   . CAD (coronary artery disease)    stents in [place] and has a totally occluded RCA  . Chicken pox   . CVA (cerebral infarction) 12/09  . Depression   . Diverticulosis   . Glaucoma    recently told by  Dr Janyth Contes he did not have it  . HTN (hypertension)   . Hyperlipemia   . Leaky heart valve   . Measles   . Mumps   . Myocardial infarction ([place])   . Personal history of poliomyelitis 09/17/2012   In childhood  . Pneumonia    hx of  . Polio   . Prosthetic valve dysfunction    Aortic stenosis and insufficiency   . Right iliac artery stenosis (HCC)   . 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 Shortness of breath    exertion, orthopnea  . Sleeping difficulties   . Stroke [facility])    no residual  . Thyroid disease    Past Surgical History:  Procedure Laterality Date  . ABDOMINAL AORTIC ANEURYSM REPAIR  2004   s/p endovascular repiar of AAA in 2004    . AORTIC VALVE REPLACEMENT  2003   #50mm pericardial valve   . APPENDECTOMY    . CARDIAC CATHETERIZATION    . CORONARY ANGIOPLASTY    . CORONARY ARTERY BYPASS GRAFT    . CORONARY STENT PLACEMENT    . INTRAOPERATIVE TRANSESOPHAGEAL ECHOCARDIOGRAM N/A 03/23/2014   Procedure: INTRAOPERATIVE TRANSESOPHAGEAL ECHOCARDIOGRAM;  Surgeon: Sherren Mocha, MD;  Location: [facility] OR;  Service: Open Heart Surgery;  Laterality: N/A;  . JOINT REPLACEMENT    . LEFT AND RIGHT HEART CATHETERIZATION WITH CORONARY ANGIOGRAM N/A 02/08/2014   Procedure: LEFT AND RIGHT HEART CATHETERIZATION WITH CORONARY ANGIOGRAM;  Surgeon: Blane Ohara, MD;  Location: [facility] CATH LAB;  Service: Cardiovascular;  Laterality: N/A;  . TEE WITHOUT CARDIOVERSION N/A 09/02/2013   Procedure: TRANSESOPHAGEAL ECHOCARDIOGRAM (TEE);  Surgeon: Dorothy Spark, MD;  Location: [place];  Service: Cardiovascular;  Laterality: N/A;  . TONSILLECTOMY    . TRANSCATHETER AORTIC VALVE REPLACEMENT, TRANSFEMORAL N/A 03/23/2014   Procedure: TRANSCATHETER AORTIC VALVE REPLACEMENT, TRANSFEMORAL;  Surgeon: Sherren Mocha, MD;  Location: [place];  Service: Open Heart Surgery;  Laterality: N/A;  valve in valve TAVR  . WISDOM TOOTH EXTRACTION      FAMILY HISTORY Family History  Problem Relation Age of Onset  . Colon cancer Mother   . Cancer Mother   . Aneurysm Father   . Heart attack Brother        CHF  . Prostate cancer Brother   . Cancer Brother   . Heart disease Brother   . Cancer Daughter     SOCIAL HISTORY Social History   Tobacco Use  . Smoking status: Former Smoker    Packs/day: 0.50    Years: 30.00    Pack years: 15.00    Types: Cigarettes    Quit date: 10/04/1980    Years since quitting: 39.4  . Smokeless tobacco: Never Used  Vaping Use  . Vaping Use: Never used  Substance Use Topics  . Alcohol use: Yes    Alcohol/week: 7.0 standard drinks    Types: 7 Shots of liquor per week    Comment: cocktails/night  . Drug use: No          OPHTHALMIC EXAM:  Base Eye Exam    Visual Acuity (Snellen - Linear)      Right Left   Dist cc 20/25-2 20/40-2   Dist ph cc  20/30-1   Correction: Glasses       Tonometry (Tonopen, 1:20 PM)      Right Left   Pressure 14 12  Pupils      Pupils Dark Light Shape React APD   Right PERRL 5 4 Round Brisk None   Left PERRL 5 4 Round Brisk None       Visual Fields (Counting fingers)      Left Right    Full Full       Extraocular Movement      Right Left    Full Full       Neuro/Psych    Oriented x3: Yes   Mood/Affect: Normal       Dilation    Right eye: 1.0% Mydriacyl, 2.5% Phenylephrine @ 1:20 PM        Slit Lamp and Fundus Exam    External Exam      Right Left   External Normal Normal       Slit Lamp Exam      Right Left   Lids/Lashes Normal Normal   Conjunctiva/Sclera White and quiet White and quiet   Cornea Clear Clear   Anterior Chamber Deep and quiet Deep and quiet   Iris Round and reactive Round and reactive   Lens Posterior chamber intraocular lens Posterior chamber intraocular lens   Vitreous Normal Normal          IMAGING AND PROCEDURES  Imaging and Procedures for 02/22/20  OCT, Retina - OU - Both Eyes       Right Eye Quality was good. Scan locations included subfoveal. Central Foveal Thickness: 279. Progression has improved. Findings include abnormal foveal contour, inner retinal atrophy, outer retinal atrophy.   Left Eye Quality was good. Scan locations included subfoveal. Central Foveal Thickness: 270. Progression has been stable. Findings include abnormal foveal contour, central retinal atrophy, outer retinal atrophy, inner retinal atrophy.   Notes Improved OD, on intravitreal Avastin currently 8-week interval, repeat OD today and examination in 10 weeks       Intravitreal Injection, Pharmacologic Agent - OD - Right Eye       Time Out 02/22/2020. 2:16 PM. Confirmed correct patient, procedure, site, and patient consented.    Anesthesia Topical anesthesia was used. Anesthetic medications included Akten 3.5%.   Procedure Preparation included Ofloxacin , 10% betadine to eyelids, 5% betadine to ocular surface. A 30 gauge needle was used.   Injection:  1.25 mg Bevacizumab (AVASTIN) SOLN   NDC: [address]-4, Lot: [id number]   Route: Intravitreal, Site: Right Eye, Waste: 0 mg  Post-op Post injection exam found visual acuity of at least counting fingers. The patient tolerated the procedure well. There were no complications. The patient received written and verbal post procedure care education. Post injection medications were not given.                 ASSESSMENT/PLAN:  Exudative age-related macular degeneration of right eye with active choroidal neovascularization (HCC) Improved OD, repeat examination in 10 weeks after intravitreal Avastin today      ICD-10-CM   1. Exudative age-related macular degeneration of right eye with active choroidal neovascularization (HCC)  H35.3211 OCT, Retina - OU - Both Eyes    Intravitreal Injection, Pharmacologic Agent - OD - Right Eye    Bevacizumab (AVASTIN) SOLN 1.25 mg    1.  Much less subretinal fluid and lesion activity OD, wet AMD, 8 weeks after injection intravitreal Avastin, repeat today and exam in 10 weeks  2.  3.  Ophthalmic Meds Ordered this visit:  Meds ordered this encounter  Medications  . Bevacizumab (AVASTIN) SOLN 1.25 mg  Return in about 10 weeks (around 05/02/2020) for AVASTIN OCT, OD.  Patient Instructions  Patient to notify the office should new visual difficulties, symptoms or decline occur    Explained the diagnoses, plan, and follow up with the patient and they expressed understanding.  Patient expressed understanding of the importance of proper follow up care.   Clent Demark Benjaman Artman M.D. Diseases & Surgery of the Retina and Vitreous [facility] 02/22/20     Abbreviations: M myopia (nearsighted); A  astigmatism; H hyperopia (farsighted); P presbyopia; Mrx spectacle prescription;  CTL contact lenses; OD right eye; OS left eye; OU both eyes  XT exotropia; ET esotropia; PEK punctate epithelial keratitis; PEE punctate epithelial erosions; DES dry eye syndrome; MGD meibomian gland dysfunction; ATs artificial tears; PFAT's preservative free artificial tears; [place] nuclear sclerotic cataract; PSC posterior subcapsular cataract; ERM epi-retinal membrane; PVD posterior vitreous detachment; RD retinal detachment; DM diabetes mellitus; DR diabetic retinopathy; NPDR non-proliferative diabetic retinopathy; PDR proliferative diabetic retinopathy; CSME clinically significant macular edema; DME diabetic macular edema; dbh dot blot hemorrhages; CWS cotton wool spot; POAG primary open angle glaucoma; C/D cup-to-disc ratio; HVF humphrey visual field; GVF goldmann visual field; OCT optical coherence tomography; IOP intraocular pressure; BRVO Branch retinal vein occlusion; CRVO central retinal vein occlusion; CRAO central retinal artery occlusion; BRAO branch retinal artery occlusion; RT retinal tear; SB scleral buckle; PPV pars plana vitrectomy; VH Vitreous hemorrhage; PRP panretinal laser photocoagulation; IVK intravitreal kenalog; VMT vitreomacular traction; MH Macular hole;  NVD neovascularization of the disc; NVE neovascularization elsewhere; AREDS age related eye disease study; ARMD age related macular degeneration; POAG primary open angle glaucoma; EBMD epithelial/anterior basement membrane dystrophy; ACIOL anterior chamber intraocular lens; IOL intraocular lens; PCIOL posterior chamber intraocular lens; Phaco/IOL phacoemulsification with intraocular lens placement; [place] photorefractive keratectomy; LASIK laser assisted in situ keratomileusis; HTN hypertension; DM diabetes mellitus; COPD chronic obstructive pulmonary disease

## 2020-02-22 NOTE — Patient Instructions (Signed)
Patient to notify the office should new visual difficulties, symptoms or decline occur

## 2020-02-22 NOTE — Assessment & Plan Note (Signed)
Improved OD, repeat examination in 10 weeks after intravitreal Avastin today

## 2020-03-01 ENCOUNTER — Ambulatory Visit (INDEPENDENT_AMBULATORY_CARE_PROVIDER_SITE_OTHER): Payer: Medicare Other | Admitting: Nurse Practitioner

## 2020-03-01 ENCOUNTER — Encounter: Payer: Self-pay | Admitting: Nurse Practitioner

## 2020-03-01 ENCOUNTER — Other Ambulatory Visit: Payer: Self-pay

## 2020-03-01 VITALS — HR 72 | Ht 66.0 in | Wt 198.0 lb

## 2020-03-01 DIAGNOSIS — I259 Chronic ischemic heart disease, unspecified: Secondary | ICD-10-CM

## 2020-03-01 DIAGNOSIS — I5032 Chronic diastolic (congestive) heart failure: Secondary | ICD-10-CM

## 2020-03-01 DIAGNOSIS — Z952 Presence of prosthetic heart valve: Secondary | ICD-10-CM | POA: Diagnosis not present

## 2020-03-01 MED ORDER — PAROXETINE HCL 20 MG PO TABS: 10.0000 mg | ORAL_TABLET | ORAL | 3 refills | Status: DC

## 2020-03-01 MED ORDER — LEVOTHYROXINE SODIUM 50 MCG PO TABS: 50.0000 ug | ORAL_TABLET | Freq: Every day | ORAL | 3 refills | Status: DC

## 2020-03-01 NOTE — Patient Instructions (Addendum)
After Visit Summary:  We will be checking the following labs today - BMET, CBC, HPF, TSH   Medication Instructions:    Continue with your current medicines. BUT  I am stopping Losartan  I refilled the Paxil and Thyroid medicine today.    If you need a refill on your cardiac medications before your next appointment, please call your pharmacy.     Testing/Procedures To Be Arranged:  N/A  Follow-Up:   See me in one month    At [facility], you and your health needs are our priority.  As part of our continuing mission to provide you with exceptional heart care, we have created designated Provider Care Teams.  These Care Teams include your primary Cardiologist (physician) and Advanced Practice Providers (APPs -  Physician Assistants and Nurse Practitioners) who all work together to provide you with the care you need, when you need it.  Special Instructions:  . Stay safe, wash your hands for at least 20 seconds and wear a mask when needed.     Call the [place] office at [phone] if you have any questions, problems or concerns.

## 2020-03-02 ENCOUNTER — Other Ambulatory Visit: Payer: Self-pay | Admitting: *Deleted

## 2020-03-02 LAB — HEPATIC FUNCTION PANEL
ALT: 14 IU/L (ref 0–44)
AST: 24 IU/L (ref 0–40)
Albumin: 3.9 g/dL (ref 3.5–4.6)
Alkaline Phosphatase: 91 IU/L (ref 48–121)
Bilirubin Total: 1 mg/dL (ref 0.0–1.2)
Bilirubin, Direct: 0.22 mg/dL (ref 0.00–0.40)
Total Protein: 6.6 g/dL (ref 6.0–8.5)

## 2020-03-02 LAB — CBC
Hematocrit: 47 % (ref 37.5–51.0)
Hemoglobin: 15.7 g/dL (ref 13.0–17.7)
MCH: 32.6 pg (ref 26.6–33.0)
MCHC: 33.4 g/dL (ref 31.5–35.7)
MCV: 98 fL — ABNORMAL HIGH (ref 79–97)
Platelets: 165 10*3/uL (ref 150–450)
RBC: 4.82 x10E6/uL (ref 4.14–5.80)
RDW: 12 % (ref 11.6–15.4)
WBC: 7.2 10*3/uL (ref 3.4–10.8)

## 2020-03-02 LAB — BASIC METABOLIC PANEL
BUN/Creatinine Ratio: 17 (ref 10–24)
BUN: 31 mg/dL (ref 10–36)
CO2: 24 mmol/L (ref 20–29)
Calcium: 9.1 mg/dL (ref 8.6–10.2)
Chloride: 101 mmol/L (ref 96–106)
Creatinine, Ser: 1.84 mg/dL — ABNORMAL HIGH (ref 0.76–1.27)
GFR calc Af Amer: 35 mL/min/{1.73_m2} — ABNORMAL LOW (ref >59)
GFR calc non Af Amer: 31 mL/min/{1.73_m2} — ABNORMAL LOW (ref >59)
Glucose: 143 mg/dL — ABNORMAL HIGH (ref 65–99)
Potassium: 5.3 mmol/L — ABNORMAL HIGH (ref 3.5–5.2)
Sodium: 139 mmol/L (ref 134–144)

## 2020-03-02 LAB — TSH: TSH: 4.03 u[IU]/mL (ref 0.450–4.500)

## 2020-03-03 ENCOUNTER — Telehealth: Payer: Self-pay | Admitting: *Deleted

## 2020-03-03 NOTE — Telephone Encounter (Signed)
Daughter calling in today to iterate pt's medications.  Pt is back on track.

## 2020-03-21 NOTE — Progress Notes (Signed)
CARDIOLOGY OFFICE NOTE  Date:  04/04/2020    [name] [name] Date of Birth: 08-21-1922 Medical Record #[id number]  PCP:  Patient, No Pcp Per  Cardiologist:  Servando Snare & Burt Knack  Chief Complaint  Patient presents with  . Follow-up    History of Present Illness: [name] [name] is a 86 y.o. male who presents today for a one month check. Seen for Dr. Burt Knack.Former patient of Dr. Susa Simmonds.Primarily follows with me.  He has CAD with remote inferior MI, past stent to the LCX and has a totally occluded RCA. Had prior AVR by Dr. Cyndia Bent in 2003, past AAA repair in 2004, PVD, past CVA, HLD, BPH and hypothyroidism. He developed severe bioprosthetic valve aortic insufficiency with worsening heart failure and was ultimately treated with valve and valve TAVR using a 23 mm Edwards Sapian XT valve via a transfemoral approach on 03/23/2014.  I have seen him multiple times over the past few years -hiswifehasdied. He took this pretty hard.He has had some bouts with volume overload. Last echo with more AS - reviewed with Dr. Burt Knack - managed conservatively now. General decline overall. He hasrefused to get a Lifeline. Kids have beenpretty frustrated with him. Lots of medical issues - tried to get him back to his PCP.His medicines are never correct and unclear.He has had some falls - refuses to get a cane/walker or lifeline.   I saw him for a virtual visit in April of 2020 - seemed to be doing ok for the most part.Then seen as a work in back in June of 2020 - more swelling - falling - SOB. There were lots of concerns. He was not wearing a mask - not staying at home. Not taking his Lasix due to going out and having incontinence.When seen last August - had stopped driving - seemed to be staying home better. Have changed him to Torsemide due to concern for tongue swelling with his Lasix that he said he had chronically(I was just recently made aware of this).Last seen back in December - seemed to  be doing ok but getting wobbly/frail. Sold his car. There is lots of difficulty with him letting his children help him out.   Seen last month - very unhappy - out of his medicines - not vaccinated - very angry in general - would not go back to primary care - thought Medicare was being "ripped off", etc. Remained short of breath. He was hypotensive - I stopped his ARB.   Comes in today. Here with his son [name] [name] today. Not as angry today as he typically is.  [name] [name] has noted more of a change recently. [name] [name] does see him every day - notes more difficulty breathing at times and that he is sleeping more. Apparently he asked Tom for oxygen one day - but then says he does not want. No chest pain. No swelling. Not dizzy. No syncope.  Weight is down 2 pounds. Pretty much homebound now that he has stopped driving.    Past Medical History:  Diagnosis Date  . AAA (abdominal aortic aneurysm) ([place])    s/p endovascular repair 2004  . Aortic insufficiency    Prosthetic valve dysfunction  . Aortic stenosis    s/p AVR in 2003, bovine  . Arthritis   . BCC (basal cell carcinoma) 09/17/2012  . BPH (benign prostatic hyperplasia)   . CAD (coronary artery disease)    stents in [place] and has a totally occluded RCA  . Chicken pox   .  CVA (cerebral infarction) 12/09  . Depression   . Diverticulosis   . Glaucoma    recently told by Dr Janyth Contes he did not have it  . HTN (hypertension)   . Hyperlipemia   . Leaky heart valve   . Measles   . Mumps   . Myocardial infarction ([place])   . Personal history of poliomyelitis 09/17/2012   In childhood  . Pneumonia    hx of  . Polio   . Prosthetic valve dysfunction    Aortic stenosis and insufficiency   . Right iliac artery stenosis ([facility])   . 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 Shortness of breath    exertion, orthopnea  . Sleeping difficulties   . Stroke  [facility])    no residual  . Thyroid disease     Past Surgical History:  Procedure Laterality Date  . ABDOMINAL AORTIC ANEURYSM REPAIR  2004   s/p endovascular repiar of AAA in 2004  . AORTIC VALVE REPLACEMENT  2003   #101mm pericardial valve   . APPENDECTOMY    . CARDIAC CATHETERIZATION    . CORONARY ANGIOPLASTY    . CORONARY ARTERY BYPASS GRAFT    . CORONARY STENT PLACEMENT    . INTRAOPERATIVE TRANSESOPHAGEAL ECHOCARDIOGRAM N/A 03/23/2014   Procedure: INTRAOPERATIVE TRANSESOPHAGEAL ECHOCARDIOGRAM;  Surgeon: Sherren Mocha, MD;  [facility];  Service: Open Heart Surgery;  Laterality: N/A;  . JOINT REPLACEMENT    . LEFT AND RIGHT HEART CATHETERIZATION WITH CORONARY ANGIOGRAM N/A 02/08/2014   Procedure: LEFT AND RIGHT HEART CATHETERIZATION WITH CORONARY ANGIOGRAM;  Surgeon: Blane Ohara, MD;  Location: [facility] CATH LAB;  Service: Cardiovascular;  Laterality: N/A;  . TEE WITHOUT CARDIOVERSION N/A 09/02/2013   Procedure: TRANSESOPHAGEAL ECHOCARDIOGRAM (TEE);  Surgeon: Dorothy Spark, MD;  Location: [place];  Service: Cardiovascular;  Laterality: N/A;  . TONSILLECTOMY    . TRANSCATHETER AORTIC VALVE REPLACEMENT, TRANSFEMORAL N/A 03/23/2014   Procedure: TRANSCATHETER AORTIC VALVE REPLACEMENT, TRANSFEMORAL;  Surgeon: Sherren Mocha, MD;  Location: [place];  Service: Open Heart Surgery;  Laterality: N/A;  valve in valve TAVR  . WISDOM TOOTH EXTRACTION       Medications: Current Meds  Medication Sig  . acetaminophen (TYLENOL) 500 MG tablet Take 1,000 mg by mouth every 4 (four) hours as needed.  Marland Kitchen aspirin 81 MG tablet Take 81 mg by mouth daily.  . B Complex-C (SUPER B COMPLEX) TABS Take 1 tablet by mouth daily.  . clindamycin (CLEOCIN) 150 MG capsule TK 4 CS PO PRIOR TO DENTAL APPOINTMENT  . finasteride (PROSCAR) 5 MG tablet TAKE 1 TABLET BY MOUTH  DAILY  . ipratropium (ATROVENT) 0.03 % nasal spray Place 2 sprays into both nostrils as needed.  Marland Kitchen levothyroxine (SYNTHROID) 50 MCG tablet Take  1 tablet (50 mcg total) by mouth daily.  . metoprolol tartrate (LOPRESSOR) 25 MG tablet Take 1 tablet (25 mg total) by mouth 2 (two) times daily.  . Multiple Vitamins-Minerals (PRESERVISION AREDS 2 PO) Take 1 capsule by mouth 2 (two) times daily.   Marland Kitchen PARoxetine (PAXIL) 20 MG tablet Take 0.5 tablets (10 mg total) by mouth every morning.  . potassium chloride (MICRO-K) 10 MEQ CR capsule Take 1 capsule (10 mEq total) by mouth daily.  Marland Kitchen torsemide (DEMADEX) 20 MG tablet Take 1 tablet (20 mg total) by mouth daily.  . [DISCONTINUED] torsemide (DEMADEX) 20 MG tablet Take 0.5 tablets (10  mg total) by mouth daily.     Allergies: Allergies  Allergen Reactions  . Lasix [Furosemide] Swelling    Tongue swelling  . Mucinex [Guaifenesin Er]     Tongue swelling  . Amoxicillin-Pot Clavulanate Itching  . Morphine And Related Nausea And Vomiting  . Penicillins Hives    Social History: The patient  reports that he quit smoking about 39 years ago. His smoking use included cigarettes. He has a 15.00 pack-year smoking history. He has never used smokeless tobacco. He reports current alcohol use of about 7.0 standard drinks of alcohol per week. He reports that he does not use drugs.   Family History: The patient's family history includes Aneurysm in his father; Cancer in his brother, daughter, and mother; Colon cancer in his mother; Heart attack in his brother; Heart disease in his brother; Prostate cancer in his brother.   Review of Systems: Please see the history of present illness.   All other systems are reviewed and negative.   Physical Exam: VS:  BP 116/88   Pulse 71   Ht 5\' 6"  (1.676 m)   Wt 196 lb (88.9 kg)   SpO2 90%   BMI 31.64 kg/m  .  BMI Body mass index is 31.64 kg/m.  Wt Readings from Last 3 Encounters:  04/04/20 196 lb (88.9 kg)  03/01/20 198 lb (89.8 kg)  06/24/19 196 lb 1.9 oz (89 kg)    General: Alert and in no acute distress.   Cardiac: Regular rate and rhythm. Harsh outflow  murmur. No significant edema.  Respiratory:  Lungs are clear to auscultation bilaterally with normal work of breathing.  GI: Soft and nontender.  MS: No deformity or atrophy. Gait and ROM intact.  Skin: Warm and dry. Color is normal.  Neuro:  Strength and sensation are intact and no gross focal deficits noted.  Psych: Alert, appropriate and with normal affect.   LABORATORY DATA:  EKG:  EKG is not ordered today.    Lab Results  Component Value Date   WBC 7.2 03/01/2020   HGB 15.7 03/01/2020   HCT 47.0 03/01/2020   PLT 165 03/01/2020   GLUCOSE 143 (H) 03/01/2020   CHOL 177 01/14/2019   TRIG 145 01/14/2019   HDL 39 (L) 01/14/2019   LDLCALC 109 (H) 01/14/2019   ALT 14 03/01/2020   AST 24 03/01/2020   NA 139 03/01/2020   K 5.3 (H) 03/01/2020   CL 101 03/01/2020   CREATININE 1.84 (H) 03/01/2020   BUN 31 03/01/2020   CO2 24 03/01/2020   TSH 4.030 03/01/2020   INR 1.25 03/23/2014   HGBA1C 6.3 (H) 03/19/2014     BNP (last 3 results) No results for input(s): BNP in the last 8760 hours.  ProBNP (last 3 results) No results for input(s): PROBNP in the last 8760 hours.   Other Studies Reviewed Today:  ECHO Study Conclusions 10/2016  - Left ventricle: The cavity size was normal. Wall thickness was increased in a pattern of moderate LVH. Systolic function was normal. The estimated ejection fraction was in the range of 50% to 55%. Wall motion was normal; there were no regional wall motion abnormalities. Doppler parameters are consistent with abnormal left ventricular relaxation (grade 1 diastolic dysfunction). Doppler parameters are consistent with high ventricular filling pressure. - Aortic valve: A transcatheter aortic valve bioprosthesis was present. There was severe stenosis. Peak velocity (S): 413 cm/s. Mean gradient (S): 41 mm Hg. Valve area (VTI): 0.59 cm^2. Valve area (Vmax): 0.53 cm^2. Valve  area (Vmean): 0.57 cm^2. - Mitral valve:  Transvalvular velocity was within the normal range. There was no evidence for stenosis. There was mild regurgitation. - Right ventricle: The cavity size was normal. Wall thickness was normal. Systolic function was normal. - Atrial septum: No defect or patent foramen ovale was identified by color flow Doppler. - Tricuspid valve: There was trivial regurgitation. - Pulmonary arteries: Systolic pressure was within the normal range. PA peak pressure: 15 mm Hg (S).  Impressions:  - There is a TAVR bioprosthesis inside a bioprosthetic aortic valve. Since the echo 10/27/15, the mean gradient of the TAVR has increased from 35 mmHg to 41 mmHg, conistent with severe stenosis.    ASSESSMENT & PLAN:   1. Prior hypotension - BP is better - remains off ARB.   2. Severe AS - prior TAVR - valve in valve - no other options are available to him - I do not really see a reason to update his echo - we can try to diurese him a little more - he has not been taking his Demadex routinely anyway - will go with 20 mg a day. BMET today.   3. Advanced age - he still just wants to die. I think goal of care is comfort and safety - will try to offload congestion with extra Demadex. Overall prognosis tenuous. He does not wish to have oxygen brought into his home.   4. HLD - off therapy - no reason to recheck.   5. Depression - back on Paxil.   6. Chronic back pain - not a candidate for surgery - favor conservative measures.   7. COVID 19 - no plans to get vaccinated.   8. Chronic diastolic HF - see above.   Current medicines are reviewed with the patient today.  The patient does not have concerns regarding medicines other than what has been noted above.  The following changes have been made:  See above.  Labs/ tests ordered today include:    Orders Placed This Encounter  Procedures  . Basic metabolic panel     Disposition:   FU with me in 2 months.   Patient is agreeable to  this plan and will call if any problems develop in the interim.   SignedTruitt Merle, NP  04/04/2020 3:14 PM  [facility] [address] [place], [place]  [address] Phone: [phone] Fax: [phone]

## 2020-04-04 ENCOUNTER — Ambulatory Visit (INDEPENDENT_AMBULATORY_CARE_PROVIDER_SITE_OTHER): Payer: Medicare Other | Admitting: Nurse Practitioner

## 2020-04-04 ENCOUNTER — Other Ambulatory Visit: Payer: Self-pay

## 2020-04-04 ENCOUNTER — Encounter: Payer: Self-pay | Admitting: Nurse Practitioner

## 2020-04-04 VITALS — BP 116/88 | HR 71 | Ht 66.0 in | Wt 196.0 lb

## 2020-04-04 DIAGNOSIS — Z952 Presence of prosthetic heart valve: Secondary | ICD-10-CM | POA: Diagnosis not present

## 2020-04-04 DIAGNOSIS — R0600 Dyspnea, unspecified: Secondary | ICD-10-CM | POA: Diagnosis not present

## 2020-04-04 DIAGNOSIS — I5032 Chronic diastolic (congestive) heart failure: Secondary | ICD-10-CM

## 2020-04-04 DIAGNOSIS — I259 Chronic ischemic heart disease, unspecified: Secondary | ICD-10-CM | POA: Diagnosis not present

## 2020-04-04 DIAGNOSIS — I38 Endocarditis, valve unspecified: Secondary | ICD-10-CM | POA: Diagnosis not present

## 2020-04-04 MED ORDER — TORSEMIDE 20 MG PO TABS: 20.0000 mg | ORAL_TABLET | Freq: Every day | ORAL | 3 refills | Status: DC

## 2020-04-04 NOTE — Patient Instructions (Addendum)
After Visit Summary:  We will be checking the following labs today - BMET   Medication Instructions:    Continue with your current medicines. BUT  I am going to increase the Torsemide to a whole pill each day.    If you need a refill on your cardiac medications before your next appointment, please call your pharmacy.     Testing/Procedures To Be Arranged:  N/A  Follow-Up:   See me in about 2 months    At [facility], you and your health needs are our priority.  As part of our continuing mission to provide you with exceptional heart care, we have created designated Provider Care Teams.  These Care Teams include your primary Cardiologist (physician) and Advanced Practice Providers (APPs -  Physician Assistants and Nurse Practitioners) who all work together to provide you with the care you need, when you need it.  Special Instructions:  . Stay safe, wash your hands for at least 20 seconds and wear a mask when needed.  . It was good to talk with you today.    Call the [place] office at [phone] if you have any questions, problems or concerns.

## 2020-04-05 LAB — BASIC METABOLIC PANEL
BUN/Creatinine Ratio: 18 (ref 10–24)
BUN: 28 mg/dL (ref 10–36)
CO2: 26 mmol/L (ref 20–29)
Calcium: 9 mg/dL (ref 8.6–10.2)
Chloride: 100 mmol/L (ref 96–106)
Creatinine, Ser: 1.57 mg/dL — ABNORMAL HIGH (ref 0.76–1.27)
GFR calc Af Amer: 43 mL/min/{1.73_m2} — ABNORMAL LOW (ref >59)
GFR calc non Af Amer: 37 mL/min/{1.73_m2} — ABNORMAL LOW (ref >59)
Glucose: 104 mg/dL — ABNORMAL HIGH (ref 65–99)
Potassium: 4.9 mmol/L (ref 3.5–5.2)
Sodium: 139 mmol/L (ref 134–144)

## 2020-05-02 ENCOUNTER — Encounter (INDEPENDENT_AMBULATORY_CARE_PROVIDER_SITE_OTHER): Payer: Medicare Other | Admitting: Ophthalmology

## 2020-05-09 ENCOUNTER — Other Ambulatory Visit: Payer: Self-pay

## 2020-05-09 ENCOUNTER — Ambulatory Visit (INDEPENDENT_AMBULATORY_CARE_PROVIDER_SITE_OTHER): Payer: Medicare Other | Admitting: Ophthalmology

## 2020-05-09 ENCOUNTER — Encounter (INDEPENDENT_AMBULATORY_CARE_PROVIDER_SITE_OTHER): Payer: Self-pay | Admitting: Ophthalmology

## 2020-05-09 DIAGNOSIS — H3561 Retinal hemorrhage, right eye: Secondary | ICD-10-CM

## 2020-05-09 DIAGNOSIS — I259 Chronic ischemic heart disease, unspecified: Secondary | ICD-10-CM

## 2020-05-09 DIAGNOSIS — H35371 Puckering of macula, right eye: Secondary | ICD-10-CM

## 2020-05-09 DIAGNOSIS — H353133 Nonexudative age-related macular degeneration, bilateral, advanced atrophic without subfoveal involvement: Secondary | ICD-10-CM | POA: Diagnosis not present

## 2020-05-09 DIAGNOSIS — H353211 Exudative age-related macular degeneration, right eye, with active choroidal neovascularization: Secondary | ICD-10-CM

## 2020-05-09 MED ORDER — BEVACIZUMAB CHEMO INJECTION 1.25MG/0.05ML SYRINGE FOR KALEIDOSCOPE
1.2500 mg | INTRAVITREAL | Status: AC | PRN
  Administered 2020-05-09: 1.25 mg via INTRAVITREAL

## 2020-05-09 NOTE — Assessment & Plan Note (Signed)

## 2020-05-09 NOTE — Assessment & Plan Note (Signed)

## 2020-05-09 NOTE — Assessment & Plan Note (Signed)
Resolved OD

## 2020-05-09 NOTE — Progress Notes (Signed)
05/09/2020     CHIEF COMPLAINT Patient presents for Retina Follow Up   HISTORY OF PRESENT ILLNESS: [name] [name] is a 86 y.o. male who presents to the clinic today for:   HPI    Retina Follow Up    Patient presents with  Wet AMD.  In right eye.  Severity is moderate.  Duration of 11 weeks.  Since onset it is stable.  I, the attending physician,  performed the HPI with the patient and updated documentation appropriately.          Comments    11 Week Wet AMD f\u OD. Possible Avastin OD. OCT  Pt states no changes in vision, unless eyes become very tired. Pt denies any other complaints.       Last edited by Tilda Franco on 05/09/2020  1:23 PM. (History)      Referring physician: Vernie Shanks, MD [place],  [place] [id number]  HISTORICAL INFORMATION:   Selected notes from the MEDICAL RECORD NUMBER    Lab Results  Component Value Date   HGBA1C 6.3 (H) 03/19/2014     CURRENT MEDICATIONS: No current outpatient medications on file. (Ophthalmic Drugs)   No current facility-administered medications for this visit. (Ophthalmic Drugs)   Current Outpatient Medications (Other)  Medication Sig   acetaminophen (TYLENOL) 500 MG tablet Take 1,000 mg by mouth every 4 (four) hours as needed.   aspirin 81 MG tablet Take 81 mg by mouth daily.   B Complex-C (SUPER B COMPLEX) TABS Take 1 tablet by mouth daily.   clindamycin (CLEOCIN) 150 MG capsule TK 4 CS PO PRIOR TO DENTAL APPOINTMENT   finasteride (PROSCAR) 5 MG tablet TAKE 1 TABLET BY MOUTH  DAILY   ipratropium (ATROVENT) 0.03 % nasal spray Place 2 sprays into both nostrils as needed.   levothyroxine (SYNTHROID) 50 MCG tablet Take 1 tablet (50 mcg total) by mouth daily.   metoprolol tartrate (LOPRESSOR) 25 MG tablet Take 1 tablet (25 mg total) by mouth 2 (two) times daily.   Multiple Vitamins-Minerals (PRESERVISION AREDS 2 PO) Take 1 capsule by mouth 2 (two) times daily.    PARoxetine (PAXIL) 20 MG  tablet Take 0.5 tablets (10 mg total) by mouth every morning.   potassium chloride (MICRO-K) 10 MEQ CR capsule Take 1 capsule (10 mEq total) by mouth daily.   torsemide (DEMADEX) 20 MG tablet Take 1 tablet (20 mg total) by mouth daily.   No current facility-administered medications for this visit. (Other)      REVIEW OF SYSTEMS:    ALLERGIES Allergies  Allergen Reactions   Lasix [Furosemide] Swelling    Tongue swelling   Mucinex [Guaifenesin Er]     Tongue swelling   Amoxicillin-Pot Clavulanate Itching   Morphine And Related Nausea And Vomiting   Penicillins Hives    PAST MEDICAL HISTORY Past Medical History:  Diagnosis Date   AAA (abdominal aortic aneurysm) ([facility])    s/p endovascular repair 2004   Aortic insufficiency    Prosthetic valve dysfunction   Aortic stenosis    s/p AVR in 2003, bovine   Arthritis    BCC (basal cell carcinoma) 09/17/2012   BPH (benign prostatic hyperplasia)    CAD (coronary artery disease)    stents in LXC and has a totally occluded RCA   Chicken pox    CVA (cerebral infarction) 12/09   Depression    Diverticulosis    Glaucoma    recently told by Dr Janyth Contes  he did not have it   HTN (hypertension)    Hyperlipemia    Leaky heart valve    Measles    Mumps    Myocardial infarction [facility])    Personal history of poliomyelitis 09/17/2012   In childhood   Pneumonia    hx of   Polio    Prosthetic valve dysfunction    Aortic stenosis and insufficiency    Retinal hemorrhage of right eye 11/02/2019   Right iliac artery stenosis (HCC)    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   23 mm Edwards Sapien transcatheter heart valve placed via open left transfemoral approach   Shortness of breath    exertion, orthopnea   Sleeping difficulties    Stroke [facility])    no residual   Thyroid disease    Past Surgical History:  Procedure Laterality Date   ABDOMINAL AORTIC ANEURYSM REPAIR  2004   s/p  endovascular repiar of AAA in 2004   AORTIC VALVE REPLACEMENT  2003   #54mm pericardial valve    APPENDECTOMY     CARDIAC CATHETERIZATION     CORONARY ANGIOPLASTY     CORONARY ARTERY BYPASS GRAFT     CORONARY STENT PLACEMENT     INTRAOPERATIVE TRANSESOPHAGEAL ECHOCARDIOGRAM N/A 03/23/2014   Procedure: INTRAOPERATIVE TRANSESOPHAGEAL ECHOCARDIOGRAM;  Surgeon: Sherren Mocha, MD;  Location: [place];  Service: Open Heart Surgery;  Laterality: N/A;   JOINT REPLACEMENT     LEFT AND RIGHT HEART CATHETERIZATION WITH CORONARY ANGIOGRAM N/A 02/08/2014   Procedure: LEFT AND RIGHT HEART CATHETERIZATION WITH CORONARY ANGIOGRAM;  Surgeon: Blane Ohara, MD;  Location: [facility] CATH LAB;  Service: Cardiovascular;  Laterality: N/A;   TEE WITHOUT CARDIOVERSION N/A 09/02/2013   Procedure: TRANSESOPHAGEAL ECHOCARDIOGRAM (TEE);  Surgeon: Dorothy Spark, MD;  Location: [place];  Service: Cardiovascular;  Laterality: N/A;   TONSILLECTOMY     TRANSCATHETER AORTIC VALVE REPLACEMENT, TRANSFEMORAL N/A 03/23/2014   Procedure: TRANSCATHETER AORTIC VALVE REPLACEMENT, TRANSFEMORAL;  Surgeon: Sherren Mocha, MD;  Location: [place];  Service: Open Heart Surgery;  Laterality: N/A;  valve in valve TAVR   WISDOM TOOTH EXTRACTION      FAMILY HISTORY Family History  Problem Relation Age of Onset   Colon cancer Mother    Cancer Mother    Aneurysm Father    Heart attack Brother        CHF   Prostate cancer Brother    Cancer Brother    Heart disease Brother    Cancer Daughter     SOCIAL HISTORY Social History   Tobacco Use   Smoking status: Former Smoker    Packs/day: 0.50    Years: 30.00    Pack years: 15.00    Types: Cigarettes    Quit date: 10/04/1980    Years since quitting: 39.6   Smokeless tobacco: Never Used  Vaping Use   Vaping Use: Never used  Substance Use Topics   Alcohol use: Yes    Alcohol/week: 7.0 standard drinks    Types: 7 Shots of liquor per week    Comment:  cocktails/night   Drug use: No         OPHTHALMIC EXAM:  Base Eye Exam    Visual Acuity (Snellen - Linear)      Right Left   Dist cc 20/30 + 20/30 -2   Correction: Glasses       Tonometry (Tonopen, 1:28 PM)      Right Left   Pressure 12 13  Pupils      Pupils Dark Light Shape React APD   Right PERRL 4 3 Round Brisk None   Left PERRL 4 3 Round Brisk None       Visual Fields (Counting fingers)      Left Right    Full Full       Neuro/Psych    Oriented x3: Yes   Mood/Affect: Normal       Dilation    Right eye: 1.0% Mydriacyl, 2.5% Phenylephrine @ 1:28 PM        Slit Lamp and Fundus Exam    External Exam      Right Left   External Normal Normal       Slit Lamp Exam      Right Left   Lids/Lashes Normal Normal   Conjunctiva/Sclera White and quiet White and quiet   Cornea Clear Clear   Anterior Chamber Deep and quiet Deep and quiet   Iris Round and reactive Round and reactive   Lens Posterior chamber intraocular lens Posterior chamber intraocular lens   Anterior Vitreous Normal Normal       Fundus Exam      Right Left   Posterior Vitreous Clear    Disc Pericapillary atrophy.    C/D Ratio 0.6    Macula Age related macular degeneration, Atrophy,  Advanced age related macular degeneration, [place], minor epiretinal membrane nasal to the fovea.    Vessels Normal    Periphery Normal           IMAGING AND PROCEDURES  Imaging and Procedures for 05/09/20  OCT, Retina - OU - Both Eyes       Right Eye Quality was good. Scan locations included subfoveal. Central Foveal Thickness: 276. Progression has improved. Findings include abnormal foveal contour, inner retinal atrophy, outer retinal atrophy.   Left Eye Quality was good. Scan locations included subfoveal. Central Foveal Thickness: 260. Progression has been stable. Findings include abnormal foveal contour, central retinal atrophy, outer retinal atrophy, inner retinal atrophy.    Notes Improved OD, on intravitreal Avastin currently 11-week interval, repeat OD today and examination in 12 weeks       Intravitreal Injection, Pharmacologic Agent - OD - Right Eye       Time Out 05/09/2020. 2:07 PM. Confirmed correct patient, procedure, site, and patient consented.   Anesthesia Topical anesthesia was used. Anesthetic medications included Akten 3.5%.   Procedure Preparation included Ofloxacin , 10% betadine to eyelids, 5% betadine to ocular surface. A 30 gauge needle was used.   Injection:  1.25 mg Bevacizumab (AVASTIN) SOLN   NDC: 70360-001-02, Lot: [id number]   Route: Intravitreal, Site: Right Eye, Waste: 0 mg  Post-op Post injection exam found visual acuity of at least counting fingers. The patient tolerated the procedure well. There were no complications. The patient received written and verbal post procedure care education. Post injection medications were not given.                 ASSESSMENT/PLAN:  Exudative age-related macular degeneration of right eye with active choroidal neovascularization (HCC) Improved anatomy and findings right eye at 11-week follow-up interval.  We will repeat injection today and examination in 3 months  Right epiretinal membrane The nature of macular pucker (epiretinal membrane ERM) was discussed with the patient as well as threshold criteria for vitrectomy surgery. I explained that in rare cases another surgery is needed to actually remove a second wrinkle should it regrow.  Most often, the epiretinal membrane and  underlying wrinkled internal limiting membrane are removed with the first surgery, to accomplish the goals.   If the operative eye is [place] (natural lens still present), cataract surgery is often recommended prior to Vitrectomy. This will enable the retina surgeon to have the best view during surgery and the patient to obtain optimal results in the future. Treatment options were discussed.  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 The results of the AREDS 2 study was discussed with the patient. A diet rich in dark leafy green vegetables was advised and specific recommendations were made regarding supplements with AREDS 2 formulation . Control of hypertension and serum cholesterol may slow the disease. Smoking cessation is mandatory to slow the disease and diminish the risk of progressing to wet age related macular degeneration. The patient was instructed in the use of an [place] and was told to return immediately for any changes in the Grid. Stressed to the patient do not rub eyes      ICD-10-CM   1. 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 Right epiretinal membrane  H35.371   3. Retinal hemorrhage of right eye  H35.61   4. Advanced nonexudative age-related macular degeneration of both eyes without subfoveal involvement  H35.3133     1.  Macular condition OD improved at 11-week interval.  No signs of recurrent CNVM.  Injection intravitreal Avastin OD today and examination in 3 months  2.  Epiretinal membrane OD observe  3.  Ophthalmic Meds Ordered this visit:  Meds ordered this encounter  Medications   Bevacizumab (AVASTIN) SOLN 1.25 mg       Return in about 3 months (around 08/09/2020) for DILATE OU, AVASTIN OCT, OD.  Patient Instructions  Patient instructed to contact the office promptly for new onset visual acuity decline or difficulties    Explained the diagnoses, plan, and follow up with the patient and they expressed understanding.  Patient expressed  understanding of the importance of proper follow up care.   Clent Demark Shaquna Geigle M.D. Diseases & Surgery of the Retina and Vitreous [facility] [place] 05/09/20     Abbreviations: M myopia (nearsighted); A astigmatism; H hyperopia (farsighted); P presbyopia; Mrx spectacle prescription;  CTL contact lenses; OD right eye; OS left eye; OU both eyes  XT exotropia; ET esotropia; PEK punctate epithelial keratitis; PEE punctate epithelial erosions; DES dry eye syndrome; MGD meibomian gland dysfunction; ATs artificial tears; PFAT's preservative free artificial tears; [place] nuclear sclerotic cataract; PSC posterior subcapsular cataract; ERM epi-retinal membrane; PVD posterior vitreous detachment; RD retinal detachment; DM diabetes mellitus; DR diabetic retinopathy; NPDR non-proliferative diabetic retinopathy; PDR proliferative diabetic retinopathy; CSME clinically significant macular edema; DME diabetic macular edema; dbh dot blot hemorrhages; CWS cotton wool spot; POAG primary open angle glaucoma; C/D cup-to-disc ratio; HVF humphrey visual field; GVF goldmann visual field; OCT optical coherence tomography; IOP intraocular pressure; BRVO Branch retinal vein occlusion; CRVO central retinal vein occlusion; CRAO central retinal artery occlusion; BRAO branch retinal artery occlusion; RT retinal tear; SB scleral buckle; PPV pars plana vitrectomy; VH Vitreous hemorrhage; PRP panretinal laser photocoagulation; IVK intravitreal  kenalog; VMT vitreomacular traction; MH Macular hole;  NVD neovascularization of the disc; NVE neovascularization elsewhere; AREDS age related eye disease study; ARMD age related macular degeneration; POAG primary open angle glaucoma; EBMD epithelial/anterior basement membrane dystrophy; ACIOL anterior chamber intraocular lens; IOL intraocular lens; PCIOL posterior chamber intraocular lens; Phaco/IOL phacoemulsification with intraocular lens placement; [place] photorefractive keratectomy; LASIK laser  assisted in situ keratomileusis; HTN hypertension; DM diabetes mellitus; COPD chronic obstructive pulmonary disease

## 2020-05-09 NOTE — Patient Instructions (Signed)
Patient instructed to contact the office promptly for new onset visual acuity decline or difficulties

## 2020-05-09 NOTE — Assessment & Plan Note (Signed)
Improved anatomy and findings right eye at 11-week follow-up interval.  We will repeat injection today and examination in 3 months

## 2020-05-30 NOTE — Progress Notes (Signed)
CARDIOLOGY OFFICE NOTE  Date:  06/13/2020    [name] [name] Date of Birth: Apr 19, 1923 Medical Record #[id number]  PCP:  Patient, No Pcp Per  Cardiologist:  Servando Snare & Burt Knack  Chief Complaint  Patient presents with  . Follow-up    History of Present Illness: [name] [name] is a 86 y.o. male who presents today for a follow up visit.  Seen for Dr. Burt Knack.Former patient of Dr. Susa Simmonds.Primarily follows with me.  He has CAD with remote inferior MI, past stent to the LCX and has a totally occluded RCA. Had prior AVR by Dr. Cyndia Bent in 2003, past AAA repair in 2004, PVD, past CVA, HLD, BPH and hypothyroidism. He developed severe bioprosthetic valve aortic insufficiency with worsening heart failure and was ultimately treated with valve and valve TAVR using a 23 mm Edwards Sapian XT valve via a transfemoral approach on 03/23/2014.  I have seen him multiple times over the past few years -hiswifehasdied. He took this pretty hard.He has had some bouts with volume overload. Last echo with more AS - reviewed with Dr. Burt Knack - managed conservatively now. General decline overall. He hasrefused to get a Lifeline. Kids have beenpretty frustrated with him. Lots of medical issues - tried to get him back to his PCP.His medicines are never correct and unclear.He has had some falls - refuses to get a cane/walker or lifeline.   I saw him for a virtual visit in [place]- seemed to be doing ok for the most part.Then seen as a work in back in [place]- more swelling - falling - SOB. There were lots of concerns. He was not wearing a mask - not staying at home. Not taking his Lasix due to going out and having incontinence.Whenseen lastAugust - hadstopped driving - seemed to be staying home better. Have changed him to Torsemide due to concern for tongue swelling with his Lasix that he said he had chronically(I was just recently made aware of this).Last seen back in December - seemed  to be doing ok but getting wobbly/frail. Sold his car. There was lots of difficulty with him letting his children help him out.  Seen back in August month - very unhappy - out of his medicines - not vaccinated - very angry in general - would not go back to primary care - thought Medicare was being "ripped off", etc. Remained short of breath. He was hypotensive - I stopped his ARB. I saw again in September - he was not as upset - [name] was with him and had noted more DOE and sleeping more. Asked for oxygen one day and then said he did not want. Pretty much homebound.   Comes in today. Here with Tom today. He seems about the same. Some occasional shortness of breath - he feels it is more related to not being able to breath out of his nose at times. He will sometimes sleep in his recliner - after eating some peanut butter - this seems to help. No chest pain. Tom feels like his situation overall is stable. Wanting medicines refilled to [facility] for cost. He did start using a cane - almost fell last night while bringing in his recycle can.   Past Medical History:  Diagnosis Date  . AAA (abdominal aortic aneurysm) ([place])    s/p endovascular repair 2004  . Aortic insufficiency    Prosthetic valve dysfunction  . Aortic stenosis    s/p AVR in 2003, bovine  . Arthritis   .  BCC (basal cell carcinoma) 09/17/2012  . BPH (benign prostatic hyperplasia)   . CAD (coronary artery disease)    stents in [place] and has a totally occluded RCA  . Chicken pox   . CVA (cerebral infarction) 12/09  . Depression   . Diverticulosis   . Glaucoma    recently told by Dr Janyth Contes he did not have it  . HTN (hypertension)   . Hyperlipemia   . Leaky heart valve   . Measles   . Mumps   . Myocardial infarction ([place])   . Personal history of poliomyelitis 09/17/2012   In childhood  . Pneumonia    hx of  . Polio   . Prosthetic valve dysfunction    Aortic stenosis and insufficiency   . Retinal hemorrhage of right eye 11/02/2019    . Right iliac artery stenosis (H[facility])   . 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 Shortness of breath    exertion, orthopnea  . Sleeping difficulties   . Stroke [facility])    no residual  . Thyroid disease     Past Surgical History:  Procedure Laterality Date  . ABDOMINAL AORTIC ANEURYSM REPAIR  2004   s/p endovascular repiar of AAA in 2004  . AORTIC VALVE REPLACEMENT  2003   #64mm pericardial valve   . APPENDECTOMY    . CARDIAC CATHETERIZATION    . CORONARY ANGIOPLASTY    . CORONARY ARTERY BYPASS GRAFT    . CORONARY STENT PLACEMENT    . INTRAOPERATIVE TRANSESOPHAGEAL ECHOCARDIOGRAM N/A 03/23/2014   Procedure: INTRAOPERATIVE TRANSESOPHAGEAL ECHOCARDIOGRAM;  Surgeon: Sherren Mocha, MD;  Location[facility];  Service: Open Heart Surgery;  Laterality: N/A;  . JOINT REPLACEMENT    . LEFT AND RIGHT HEART CATHETERIZATION WITH CORONARY ANGIOGRAM N/A 02/08/2014   Procedure: LEFT AND RIGHT HEART CATHETERIZATION WITH CORONARY ANGIOGRAM;  Surgeon: Blane Ohara, MD;  Location[facility] CATH LAB;  Service: Cardiovascular;  Laterality: N/A;  . TEE WITHOUT CARDIOVERSION N/A 09/02/2013   Procedure: TRANSESOPHAGEAL ECHOCARDIOGRAM (TEE);  Surgeon: Dorothy Spark, MD;  Location: [facility];  Service: Cardiovascular;  Laterality: N/A;  . TONSILLECTOMY    . TRANSCATHETER AORTIC VALVE REPLACEMENT, TRANSFEMORAL N/A 03/23/2014   Procedure: TRANSCATHETER AORTIC VALVE REPLACEMENT, TRANSFEMORAL;  Surgeon: Sherren Mocha, MD;  Location: [facility];  Service: Open Heart Surgery;  Laterality: N/A;  valve in valve TAVR  . WISDOM TOOTH EXTRACTION       Medications: Current Meds  Medication Sig  . acetaminophen (TYLENOL) 500 MG tablet Take 1,000 mg by mouth every 4 (four) hours as needed.  Marland Kitchen aspirin 81 MG tablet Take 81 mg by mouth daily.  . B Complex-C (SUPER B COMPLEX) TABS Take 1 tablet by mouth daily.  .  clindamycin (CLEOCIN) 150 MG capsule TK 4 CS PO PRIOR TO DENTAL APPOINTMENT  . finasteride (PROSCAR) 5 MG tablet Take 1 tablet (5 mg total) by mouth daily.  Marland Kitchen ipratropium (ATROVENT) 0.03 % nasal spray Place 2 sprays into both nostrils as needed.  Marland Kitchen levothyroxine (SYNTHROID) 50 MCG tablet Take 1 tablet (50 mcg total) by mouth daily.  . metoprolol tartrate (LOPRESSOR) 25 MG tablet Take 1 tablet (25 mg total) by mouth 2 (two) times daily.  . Multiple Vitamins-Minerals (PRESERVISION AREDS 2 PO) Take 1 capsule by mouth 2 (two) times daily.   Marland Kitchen PARoxetine (PAXIL) 20 MG tablet Take 0.5 tablets (10 mg total) by mouth  every morning.  . potassium chloride (MICRO-K) 10 MEQ CR capsule Take 1 capsule (10 mEq total) by mouth daily.  Marland Kitchen torsemide (DEMADEX) 20 MG tablet Take 1 tablet (20 mg total) by mouth daily.  . [DISCONTINUED] finasteride (PROSCAR) 5 MG tablet TAKE 1 TABLET BY MOUTH  DAILY  . [DISCONTINUED] levothyroxine (SYNTHROID) 50 MCG tablet Take 1 tablet (50 mcg total) by mouth daily.  . [DISCONTINUED] metoprolol tartrate (LOPRESSOR) 25 MG tablet Take 1 tablet (25 mg total) by mouth 2 (two) times daily.  . [DISCONTINUED] PARoxetine (PAXIL) 20 MG tablet Take 0.5 tablets (10 mg total) by mouth every morning.  . [DISCONTINUED] potassium chloride (MICRO-K) 10 MEQ CR capsule Take 1 capsule (10 mEq total) by mouth daily.  . [DISCONTINUED] torsemide (DEMADEX) 20 MG tablet Take 1 tablet (20 mg total) by mouth daily.     Allergies: Allergies  Allergen Reactions  . Lasix [Furosemide] Swelling    Tongue swelling  . Mucinex [Guaifenesin Er]     Tongue swelling  . Amoxicillin-Pot Clavulanate Itching  . Morphine And Related Nausea And Vomiting  . Penicillins Hives    Social History: The patient  reports that he quit smoking about 39 years ago. His smoking use included cigarettes. He has a 15.00 pack-year smoking history. He has never used smokeless tobacco. He reports current alcohol use of about 7.0  standard drinks of alcohol per week. He reports that he does not use drugs.   Family History: The patient's family history includes Aneurysm in his father; Cancer in his brother, daughter, and mother; Colon cancer in his mother; Heart attack in his brother; Heart disease in his brother; Prostate cancer in his brother.   Review of Systems: Please see the history of present illness.   All other systems are reviewed and negative.   Physical Exam: VS:  BP 136/66   Pulse 77   Ht 5\' 6"  (1.676 m)   Wt 198 lb (89.8 kg)   SpO2 90%   BMI 31.96 kg/m  .  BMI Body mass index is 31.96 kg/m.  Wt Readings from Last 3 Encounters:  06/13/20 198 lb (89.8 kg)  04/04/20 196 lb (88.9 kg)  03/01/20 198 lb (89.8 kg)    General: Pleasant. Alert and in no acute distress. He is hard of hearing. Weight is stable.  Cardiac: Regular rate and rhythm. Harsh outflow murmur. No edema.  Respiratory:  Lungs are clear to auscultation bilaterally with normal work of breathing.  GI: Soft and nontender.  MS: No deformity or atrophy. Gait and ROM intact.  Skin: Warm and dry. Color is normal.  Neuro:  Strength and sensation are intact and no gross focal deficits noted.  Psych: Alert, appropriate and with normal affect.   LABORATORY DATA:  EKG:  EKG is not ordered today.    Lab Results  Component Value Date   WBC 7.2 03/01/2020   HGB 15.7 03/01/2020   HCT 47.0 03/01/2020   PLT 165 03/01/2020   GLUCOSE 104 (H) 04/04/2020   CHOL 177 01/14/2019   TRIG 145 01/14/2019   HDL 39 (L) 01/14/2019   LDLCALC 109 (H) 01/14/2019   ALT 14 03/01/2020   AST 24 03/01/2020   NA 139 04/04/2020   K 4.9 04/04/2020   CL 100 04/04/2020   CREATININE 1.57 (H) 04/04/2020   BUN 28 04/04/2020   CO2 26 04/04/2020   TSH 4.030 03/01/2020   INR 1.25 03/23/2014   HGBA1C 6.3 (H) 03/19/2014     BNP (last  3 results) No results for input(s): BNP in the last 8760 hours.  ProBNP (last 3 results) No results for input(s): PROBNP in  the last 8760 hours.   Other Studies Reviewed Today:  ECHO Study Conclusions 10/2016  - Left ventricle: The cavity size was normal. Wall thickness was increased in a pattern of moderate LVH. Systolic function was normal. The estimated ejection fraction was in the range of 50% to 55%. Wall motion was normal; there were no regional wall motion abnormalities. Doppler parameters are consistent with abnormal left ventricular relaxation (grade 1 diastolic dysfunction). Doppler parameters are consistent with high ventricular filling pressure. - Aortic valve: A transcatheter aortic valve bioprosthesis was present. There was severe stenosis. Peak velocity (S): 413 cm/s. Mean gradient (S): 41 mm Hg. Valve area (VTI): 0.59 cm^2. Valve area (Vmax): 0.53 cm^2. Valve area (Vmean): 0.57 cm^2. - Mitral valve: Transvalvular velocity was within the normal range. There was no evidence for stenosis. There was mild regurgitation. - Right ventricle: The cavity size was normal. Wall thickness was normal. Systolic function was normal. - Atrial septum: No defect or patent foramen ovale was identified by color flow Doppler. - Tricuspid valve: There was trivial regurgitation. - Pulmonary arteries: Systolic pressure was within the normal range. PA peak pressure: 15 mm Hg (S).  Impressions:  - There is a TAVR bioprosthesis inside a bioprosthetic aortic valve. Since the echo 10/27/15, the mean gradient of the TAVR has increased from 35 mmHg to 41 mmHg, conistent with severe stenosis.    ASSESSMENT & PLAN:   1. Severe AS - prior TAVR - valve in valve - no other options available to him - I have not seen a reason to update his echo - he is on chronic diuretics. Symptoms seem stable.   2. Prior hypotension - BP is ok here today. No changes made today.   3. HLD - off therapy - no reason to recheck.   4. Depression - back on Paxil - I have refilled this for him. He  tends to not see PCP.   5. Advanced age - he still just wishes to die - he is ready.   6. COVID 19 - no plans to get vaccinated.   Current medicines are reviewed with the patient today.  The patient does not have concerns regarding medicines other than what has been noted above.  The following changes have been made:  See above.  Labs/ tests ordered today include:   No orders of the defined types were placed in this encounter.    Disposition:   FU with me in February - we will transition him over to [facility] and Dr. Burt Knack - he is aware that I am leaving in February. Medicines refilled to [facility] today.     Patient is agreeable to this plan and will call if any problems develop in the interim.   SignedTruitt Merle, NP  06/13/2020 3:17 PM  [facility] [address] [place], [place]  [address] Phone: [phone] Fax: [phone]

## 2020-06-07 ENCOUNTER — Ambulatory Visit: Payer: Medicare Other | Admitting: Nurse Practitioner

## 2020-06-13 ENCOUNTER — Ambulatory Visit (INDEPENDENT_AMBULATORY_CARE_PROVIDER_SITE_OTHER): Payer: Medicare Other | Admitting: Nurse Practitioner

## 2020-06-13 ENCOUNTER — Encounter: Payer: Self-pay | Admitting: Nurse Practitioner

## 2020-06-13 ENCOUNTER — Other Ambulatory Visit: Payer: Self-pay

## 2020-06-13 ENCOUNTER — Encounter (INDEPENDENT_AMBULATORY_CARE_PROVIDER_SITE_OTHER): Payer: Self-pay

## 2020-06-13 DIAGNOSIS — I38 Endocarditis, valve unspecified: Secondary | ICD-10-CM

## 2020-06-13 DIAGNOSIS — I259 Chronic ischemic heart disease, unspecified: Secondary | ICD-10-CM | POA: Diagnosis not present

## 2020-06-13 DIAGNOSIS — R0600 Dyspnea, unspecified: Secondary | ICD-10-CM

## 2020-06-13 MED ORDER — POTASSIUM CHLORIDE ER 10 MEQ PO CPCR: 10.0000 meq | ORAL_CAPSULE | Freq: Every day | ORAL | 3 refills | Status: DC

## 2020-06-13 MED ORDER — FINASTERIDE 5 MG PO TABS: 5.0000 mg | ORAL_TABLET | Freq: Every day | ORAL | 3 refills | Status: DC

## 2020-06-13 MED ORDER — METOPROLOL TARTRATE 25 MG PO TABS: 25.0000 mg | ORAL_TABLET | Freq: Two times a day (BID) | ORAL | 3 refills | Status: DC

## 2020-06-13 MED ORDER — LEVOTHYROXINE SODIUM 50 MCG PO TABS: 50.0000 ug | ORAL_TABLET | Freq: Every day | ORAL | 3 refills | Status: DC

## 2020-06-13 MED ORDER — TORSEMIDE 20 MG PO TABS: 20.0000 mg | ORAL_TABLET | Freq: Every day | ORAL | 3 refills | Status: DC

## 2020-06-13 MED ORDER — PAROXETINE HCL 20 MG PO TABS: 10.0000 mg | ORAL_TABLET | ORAL | 3 refills | Status: DC

## 2020-06-13 NOTE — Patient Instructions (Signed)
After Visit Summary:  We will be checking the following labs today - NONE   Medication Instructions:    Continue with your current medicines.   I sent in your refills today.    If you need a refill on your cardiac medications before your next appointment, please call your pharmacy.     Testing/Procedures To Be Arranged:  N/A  Follow-Up:   See me in February    At [facility], you and your health needs are our priority.  As part of our continuing mission to provide you with exceptional heart care, we have created designated Provider Care Teams.  These Care Teams include your primary Cardiologist (physician) and Advanced Practice Providers (APPs -  Physician Assistants and Nurse Practitioners) who all work together to provide you with the care you need, when you need it.  Special Instructions:  . Stay safe, wash your hands for at least 20 seconds and wear a mask when needed.  . It was good to talk with you today.    Call the [place] office at [phone] if you have any questions, problems or concerns.

## 2020-07-10 ENCOUNTER — Other Ambulatory Visit: Payer: Self-pay | Admitting: Nurse Practitioner

## 2020-07-30 ENCOUNTER — Telehealth: Payer: Self-pay | Admitting: Cardiology

## 2020-07-30 ENCOUNTER — Emergency Department (HOSPITAL_BASED_OUTPATIENT_CLINIC_OR_DEPARTMENT_OTHER): Payer: Medicare Other

## 2020-07-30 ENCOUNTER — Other Ambulatory Visit: Payer: Self-pay

## 2020-07-30 ENCOUNTER — Emergency Department (HOSPITAL_BASED_OUTPATIENT_CLINIC_OR_DEPARTMENT_OTHER)
Admission: EM | Admit: 2020-07-30 | Discharge: 2020-07-30 | Disposition: A | Discharge status: Home / Self Care | Payer: Medicare Other | Attending: Emergency Medicine | Admitting: Emergency Medicine

## 2020-07-30 ENCOUNTER — Encounter (HOSPITAL_BASED_OUTPATIENT_CLINIC_OR_DEPARTMENT_OTHER): Payer: Self-pay

## 2020-07-30 DIAGNOSIS — Z7982 Long term (current) use of aspirin: Secondary | ICD-10-CM | POA: Insufficient documentation

## 2020-07-30 DIAGNOSIS — Z79899 Other long term (current) drug therapy: Secondary | ICD-10-CM | POA: Insufficient documentation

## 2020-07-30 DIAGNOSIS — Z8673 Personal history of transient ischemic attack (TIA), and cerebral infarction without residual deficits: Secondary | ICD-10-CM | POA: Insufficient documentation

## 2020-07-30 DIAGNOSIS — I2510 Atherosclerotic heart disease of native coronary artery without angina pectoris: Secondary | ICD-10-CM | POA: Diagnosis not present

## 2020-07-30 DIAGNOSIS — I10 Essential (primary) hypertension: Secondary | ICD-10-CM | POA: Diagnosis not present

## 2020-07-30 DIAGNOSIS — M79671 Pain in right foot: Secondary | ICD-10-CM | POA: Insufficient documentation

## 2020-07-30 DIAGNOSIS — Z87891 Personal history of nicotine dependence: Secondary | ICD-10-CM | POA: Insufficient documentation

## 2020-07-30 DIAGNOSIS — E079 Disorder of thyroid, unspecified: Secondary | ICD-10-CM | POA: Insufficient documentation

## 2020-07-30 DIAGNOSIS — Z954 Presence of other heart-valve replacement: Secondary | ICD-10-CM | POA: Diagnosis not present

## 2020-07-30 NOTE — ED Notes (Signed)
ED Provider at bedside. 

## 2020-07-30 NOTE — ED Provider Notes (Signed)
[facility] EMERGENCY DEPARTMENT Provider Note   CSN: [id number] Arrival date & time: 07/30/20  1505     History Chief Complaint  Patient presents with  . Foot Pain    [name] [name] is a 87 y.o. male.  The history is provided by the patient.  Foot Pain This is a new problem. The current episode started more than 2 days ago. The problem occurs daily. The problem has not changed since onset.The symptoms are aggravated by walking. The symptoms are relieved by rest. He has tried nothing for the symptoms. The treatment provided no relief.       Past Medical History:  Diagnosis Date  . AAA (abdominal aortic aneurysm) ([place])    s/p endovascular repair 2004  . Aortic insufficiency    Prosthetic valve dysfunction  . Aortic stenosis    s/p AVR in 2003, bovine  . Arthritis   . BCC (basal cell carcinoma) 09/17/2012  . BPH (benign prostatic hyperplasia)   . CAD (coronary artery disease)    stents in [place] and has a totally occluded RCA  . Chicken pox   . CVA (cerebral infarction) 12/09  . Depression   . Diverticulosis   . Glaucoma    recently told by Dr Janyth Contes he did not have it  . HTN (hypertension)   . Hyperlipemia   . Leaky heart valve   . Measles   . Mumps   . Myocardial infarction ([place])   . Personal history of poliomyelitis 09/17/2012   In childhood  . Pneumonia    hx of  . Polio   . Prosthetic valve dysfunction    Aortic stenosis and insufficiency   . Retinal hemorrhage of right eye 11/02/2019  . Right iliac artery stenosis (HCC)   . 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 Shortness of breath    exertion, orthopnea  . Sleeping difficulties   . Stroke [facility])    no residual  . Thyroid disease     Patient Active Problem List   Diagnosis Date Noted  . Right epiretinal membrane 05/09/2020  . Advanced nonexudative age-related macular  degeneration of both eyes without subfoveal involvement 12/28/2019  . Exudative age-related macular degeneration of right eye with active choroidal neovascularization ([place]) 11/02/2019  . Cystoid macular edema of right eye 11/02/2019  . Intermediate stage nonexudative age-related macular degeneration of right eye 11/02/2019  . Aortic valve disorders 03/23/2014  . 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  . Prosthetic valve dysfunction   . Aortic insufficiency   . Aortic stenosis   . History of snoring 06/29/2013  . Abnormal laboratory test result 05/21/2013  . Need for prophylactic vaccination against Streptococcus pneumoniae (pneumococcus) 05/21/2013  . Need for assessment for sleep apnea 05/21/2013  . Encounter to establish care with new doctor 05/21/2013  . Abdominal aneurysm without mention of rupture 09/29/2012  . BCC (basal cell carcinoma) 09/17/2012  . Personal history of poliomyelitis 09/17/2012  . CVA (cerebral infarction)   . Glaucoma   . Thyroid disease   . S/P AVR (aortic valve replacement) 10/16/2010  . Stress 10/16/2010  . HTN (hypertension)   . Hyperlipemia   . CAD (coronary artery disease)     Past Surgical History:  Procedure Laterality Date  . ABDOMINAL AORTIC ANEURYSM REPAIR  2004   s/p endovascular repiar of AAA in 2004  . AORTIC VALVE REPLACEMENT  2003   #  52mm pericardial valve   . APPENDECTOMY    . CARDIAC CATHETERIZATION    . CORONARY ANGIOPLASTY    . CORONARY ARTERY BYPASS GRAFT    . CORONARY STENT PLACEMENT    . INTRAOPERATIVE TRANSESOPHAGEAL ECHOCARDIOGRAM N/A 03/23/2014   Procedure: INTRAOPERATIVE TRANSESOPHAGEAL ECHOCARDIOGRAM;  Surgeon: Sherren Mocha, MD;  Location[facility] OR;  Service: Open Heart Surgery;  Laterality: N/A;  . JOINT REPLACEMENT    . LEFT AND RIGHT HEART CATHETERIZATION WITH CORONARY ANGIOGRAM N/A 02/08/2014   Procedure: LEFT AND RIGHT HEART CATHETERIZATION WITH CORONARY ANGIOGRAM;  Surgeon: Blane Ohara, MD;   Location: [facility] CATH LAB;  Service: Cardiovascular;  Laterality: N/A;  . TEE WITHOUT CARDIOVERSION N/A 09/02/2013   Procedure: TRANSESOPHAGEAL ECHOCARDIOGRAM (TEE);  Surgeon: Dorothy Spark, MD;  Location: [facility];  Service: Cardiovascular;  Laterality: N/A;  . TONSILLECTOMY    . TRANSCATHETER AORTIC VALVE REPLACEMENT, TRANSFEMORAL N/A 03/23/2014   Procedure: TRANSCATHETER AORTIC VALVE REPLACEMENT, TRANSFEMORAL;  Surgeon: Sherren Mocha, MD;  Location: [facility];  Service: Open Heart Surgery;  Laterality: N/A;  valve in valve TAVR  . WISDOM TOOTH EXTRACTION         Family History  Problem Relation Age of Onset  . Colon cancer Mother   . Cancer Mother   . Aneurysm Father   . Heart attack Brother        CHF  . Prostate cancer Brother   . Cancer Brother   . Heart disease Brother   . Cancer Daughter     Social History   Tobacco Use  . Smoking status: Former Smoker    Packs/day: 0.50    Years: 30.00    Pack years: 15.00    Types: Cigarettes    Quit date: 10/04/1980    Years since quitting: 39.8  . Smokeless tobacco: Never Used  Vaping Use  . Vaping Use: Never used  Substance Use Topics  . Alcohol use: Yes    Alcohol/week: 7.0 standard drinks    Types: 7 Shots of liquor per week    Comment: cocktails/night  . Drug use: No    Home Medications Prior to Admission medications   Medication Sig Start Date End Date Taking? Authorizing Provider  acetaminophen (TYLENOL) 500 MG tablet Take 1,000 mg by mouth every 4 (four) hours as needed.    [provider]  aspirin 81 MG tablet Take 81 mg by mouth daily.    [provider]  B Complex-C (SUPER B COMPLEX) TABS Take 1 tablet by mouth daily.    [provider]  clindamycin (CLEOCIN) 150 MG capsule TK 4 CS PO PRIOR TO DENTAL APPOINTMENT 02/09/19   Kathyrn Drown D, NP  finasteride (PROSCAR) 5 MG tablet Take 1 tablet (5 mg total) by mouth daily. 06/13/20   Burtis Junes, NP  ipratropium (ATROVENT) 0.03 %  nasal spray Place 2 sprays into both nostrils as needed. 05/04/19   [provider]  levothyroxine (SYNTHROID) 50 MCG tablet Take 1 tablet (50 mcg total) by mouth daily. 06/13/20   Burtis Junes, NP  metoprolol tartrate (LOPRESSOR) 25 MG tablet Take 1 tablet (25 mg total) by mouth 2 (two) times daily. 06/13/20   Burtis Jun[facility], NP  Multiple Vitamins-Minerals (PRESERVISION AREDS 2 PO) Take 1 capsule by mouth 2 (two) times daily.     [provider]  PARoxetine (PAXIL) 20 MG tablet Take 0.5 tablets (10 mg total) by mouth every morning. 06/13/20   Burtis Junes, NP  potassium chloride (MICRO-K)  10 MEQ CR capsule Take 1 capsule (10 mEq total) by mouth daily. 06/13/20   Burtis Junes, NP  torsemide (DEMADEX) 20 MG tablet Take 1 tablet (20 mg total) by mouth daily. 06/13/20   Burtis Junes, NP    Allergies    Lasix [furosemide], Mucinex [guaifenesin er], Amoxicillin-pot clavulanate, Morphine and related, and Penicillins  Review of Systems   Review of Systems  Constitutional: Negative for fever.  Musculoskeletal: Positive for arthralgias. Negative for gait problem and joint swelling.  Skin: Negative for color change, pallor, rash and wound.  Neurological: Negative for weakness and numbness.    Physical Exam Updated Vital Signs BP 136/64 (BP Location: Left Arm)   Pulse 71   Temp 97.6 F (36.4 C) (Oral)   Resp 16   Ht 5\' 6"  (1.676 m)   Wt 86.2 kg   SpO2 95%   BMI 30.67 kg/m   Physical Exam Constitutional:      General: He is not in acute distress.    Appearance: He is not ill-appearing.  Cardiovascular:     Pulses: Normal pulses.  Musculoskeletal:        General: Tenderness (TTP to the base of right foot/plantar surface) present. No swelling or deformity. Normal range of motion.  Skin:    General: Skin is warm.     Findings: No erythema or rash.  Neurological:     Mental Status: He is alert.     ED Results / Procedures / Treatments   Labs (all  labs ordered are listed, but only abnormal results are displayed) Labs Reviewed - No data to display  EKG None  Radiology DG Foot Complete Right  Result Date: 07/30/2020 CLINICAL DATA:  RIGHT foot pain EXAM: RIGHT FOOT COMPLETE - 3+ VIEW COMPARISON:  None. FINDINGS: Osteopenia. No acute fracture or dislocation. Toe flexion deformities. Mild degenerative changes of the first MTP. No area of erosion or osseous destruction. No unexpected radiopaque foreign body. Soft tissues are unremarkable. IMPRESSION: 1. No acute findings. Electronically Signed   By: Valentino Saxon MD   On: 07/30/2020 17:07    Procedures Procedures (including critical care time)  Medications Ordered in ED Medications - No data to display  ED Course  I have reviewed the triage vital signs and the nursing notes.  Pertinent labs & imaging results that were available during my care of the patient were reviewed by me and considered in my medical decision making (see chart for details).    MDM Rules/Calculators/A&P                          [name] is here for right foot pain.  Started after wearing may be new pair of shoes.  Normal vitals.  No fever.  Pain mostly in the plantar surface of the right foot.  There is no erythema redness or swelling.  No concern for infection.  X-ray was negative for fracture.  Suspect he might have some plantar fasciitis or discomfort secondary to new pair of shoes.  Conservative treatment with [place] shoe, Tylenol, Motrin.  Will refer to sports medicine if he is still having discomfort.  Discharged in good condition.  Understands return precautions.  Patient with good pulses and doubt arterial process.  No concern for DVT.  This chart was dictated using voice recognition software.  Despite best efforts to proofread,  errors can occur which can change the documentation meaning.   Final Clinical Impression(s) / ED  Diagnoses Final diagnoses:  Foot pain, right    Rx / DC  Orders ED Discharge Orders    None       Lennice Sites, DO 07/30/20 1750

## 2020-07-30 NOTE — Telephone Encounter (Signed)
Pt has edema of rt foot and cannot bear weight.  He is having difficulty ambulation.  It is swollen and he take half of his torsemide daily, he could increase to full pill but concern with amount of pain.  I asked his daughter to take to ER or urgent care to be evaluated.   They can check labs as well.

## 2020-07-30 NOTE — ED Triage Notes (Signed)
Pt reports having R foot pain x few days. Pt denies any injury

## 2020-08-01 ENCOUNTER — Telehealth: Payer: Self-pay | Admitting: Family Medicine

## 2020-08-01 NOTE — Telephone Encounter (Signed)
Called pt to scheduled ED follow up on foot pain--Pt declined says foot is much better, appt not needed.  --glh

## 2020-08-09 ENCOUNTER — Encounter (INDEPENDENT_AMBULATORY_CARE_PROVIDER_SITE_OTHER): Payer: Medicare Other | Admitting: Ophthalmology

## 2020-08-31 NOTE — Progress Notes (Signed)
CARDIOLOGY OFFICE NOTE  Date:  09/14/2020    [name] [name] Date of Birth: 1923-06-18 Medical Record #[id number]  PCP:  Patient, No Pcp Per  Cardiologist:  Servando Snare & Burt Knack   Chief Complaint  Patient presents with  . Follow-up    Seen for Dr. Burt Knack    History of Present Illness: [name] [name] is a 87 y.o. male who presents today for a follow up visit. Seen for Dr. Burt Knack. Former patient of Dr. Susa Simmonds. Primarily follows with me.    He has CAD with remote inferior MI, past stent to the LCX and has a totally occluded RCA. Had prior AVR by Dr. Cyndia Bent in 2003, past AAA repair in 2004, PVD, past CVA, HLD, BPH and hypothyroidism. He developed severe bioprosthetic valve aortic insufficiency with worsening heart failure and was ultimately treated with valve and valve TAVR using a 23 mm Edwards Sapian XT valve via a transfemoral approach on 03/23/2014.   I have seen him multiple times over the past few years - his wife has died. He took this pretty hard. He has had some bouts with volume overload. Last echo with more AS - reviewed with Dr. Burt Knack - managed conservatively now. General decline overall.  He has refused to get a Lifeline. Kids have been pretty frustrated with him. Lots of medical issues - tried to get him back to his PCP. His medicines are never correct and unclear. He has had some falls - refuses to get a cane/walker or lifeline.    I saw him for a virtual visit in April of 2020 - seemed to be doing ok for the most part. Then seen as a work in back in June of 2020 - more swelling - falling - SOB. There were lots of concerns. He was not wearing a mask - not staying at home. Not taking his Lasix due to going out and having incontinence. When seen last August - had stopped driving - seemed to be staying home better. Have changed him to Torsemide due to concern for tongue swelling with his Lasix that he said he had chronically (I was just recently made aware of this). Last seen  back in December - seemed to be doing ok but getting wobbly/frail. Sold his car. There was lots of difficulty with him letting his children help him out.    When seen back in August month - very unhappy - out of his medicines - not vaccinated - very angry in general - would not go back to primary care - thought Medicare was being "ripped off", etc. Remained short of breath. He was hypotensive - I stopped his ARB. I saw again in September - he was not as upset - [name] was with him and had noted more DOE and sleeping more. Asked for oxygen one day and then said he did not want. Pretty much homebound. Last visit was in November - he was doing ok - had started using his cane.    Comes in today. Here with Tom.  He wanted to see me one more time before I leave. He is doing ok. Breathing remains short. Weight may be up a few more pounds. He notes more PND/orthopnea. Only taking his Torsemide just 1/2 a pill a day. No chest pain. He is pretty sedentary. He knows that "this is just how it is going to be".   Past Medical History:  Diagnosis Date  . AAA (abdominal aortic aneurysm) (HCC)    s/p  endovascular repair 2004  . Aortic insufficiency    Prosthetic valve dysfunction  . Aortic stenosis    s/p AVR in 2003, bovine  . Arthritis   . BCC (basal cell carcinoma) 09/17/2012  . BPH (benign prostatic hyperplasia)   . CAD (coronary artery disease)    stents in [place] and has a totally occluded RCA  . Chicken pox   . CVA (cerebral infarction) 12/09  . Depression   . Diverticulosis   . Glaucoma    recently told by Dr Janyth Contes he did not have it  . HTN (hypertension)   . Hyperlipemia   . Leaky heart valve   . Measles   . Mumps   . Myocardial infarction ([place])   . Personal history of poliomyelitis 09/17/2012   In childhood  . Pneumonia    hx of  . Polio   . Prosthetic valve dysfunction    Aortic stenosis and insufficiency   . Retinal hemorrhage of right eye 11/02/2019  . Right iliac artery stenosis (HCC)    . 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 Shortness of breath    exertion, orthopnea  . Sleeping difficulties   . Stroke [facility])    no residual  . Thyroid disease     Past Surgical History:  Procedure Laterality Date  . ABDOMINAL AORTIC ANEURYSM REPAIR  2004   s/p endovascular repiar of AAA in 2004  . AORTIC VALVE REPLACEMENT  2003   #28mm pericardial valve   . APPENDECTOMY    . CARDIAC CATHETERIZATION    . CORONARY ANGIOPLASTY    . CORONARY ARTERY BYPASS GRAFT    . CORONARY STENT PLACEMENT    . INTRAOPERATIVE TRANSESOPHAGEAL ECHOCARDIOGRAM N/A 03/23/2014   Procedure: INTRAOPERATIVE TRANSESOPHAGEAL ECHOCARDIOGRAM;  Surgeon: Sherren Mocha, MD;  Location: [facility] OR;  Service: Open Heart Surgery;  Laterality: N/A;  . JOINT REPLACEMENT    . LEFT AND RIGHT HEART CATHETERIZATION WITH CORONARY ANGIOGRAM N/A 02/08/2014   Procedure: LEFT AND RIGHT HEART CATHETERIZATION WITH CORONARY ANGIOGRAM;  Surgeon: Blane Ohara, MD;  Location: [facility] CATH LAB;  Service: Cardiovascular;  Laterality: N/A;  . TEE WITHOUT CARDIOVERSION N/A 09/02/2013   Procedure: TRANSESOPHAGEAL ECHOCARDIOGRAM (TEE);  Surgeon: Dorothy Spark, MD;  Location: [place];  Service: Cardiovascular;  Laterality: N/A;  . TONSILLECTOMY    . TRANSCATHETER AORTIC VALVE REPLACEMENT, TRANSFEMORAL N/A 03/23/2014   Procedure: TRANSCATHETER AORTIC VALVE REPLACEMENT, TRANSFEMORAL;  Surgeon: Sherren Mocha, MD;  Location: [place];  Service: Open Heart Surgery;  Laterality: N/A;  valve in valve TAVR  . WISDOM TOOTH EXTRACTION       Medications: Current Meds  Medication Sig  . acetaminophen (TYLENOL) 500 MG tablet Take 1,000 mg by mouth every 4 (four) hours as needed.  Marland Kitchen aspirin 81 MG tablet Take 81 mg by mouth daily.  . B Complex-C (SUPER B COMPLEX) TABS Take 1 tablet by mouth daily.  . clindamycin (CLEOCIN) 150 MG capsule TK 4  CS PO PRIOR TO DENTAL APPOINTMENT  . finasteride (PROSCAR) 5 MG tablet Take 1 tablet (5 mg total) by mouth daily.  Marland Kitchen ipratropium (ATROVENT) 0.03 % nasal spray Place 2 sprays into both nostrils as needed.  Marland Kitchen levothyroxine (SYNTHROID) 50 MCG tablet Take 1 tablet (50 mcg total) by mouth daily.  . metoprolol tartrate (LOPRESSOR) 25 MG tablet Take 1 tablet (25 mg total) by mouth 2 (two) times daily.  . Multiple  Vitamins-Minerals (PRESERVISION AREDS 2 PO) Take 1 capsule by mouth 2 (two) times daily.   Marland Kitchen PARoxetine (PAXIL) 20 MG tablet Take 0.5 tablets (10 mg total) by mouth every morning.  . potassium chloride (MICRO-K) 10 MEQ CR capsule Take 1 capsule (10 mEq total) by mouth daily.  Marland Kitchen torsemide (DEMADEX) 20 MG tablet Take 1 tablet (20 mg total) by mouth daily.     Allergies: Allergies  Allergen Reactions  . Lasix [Furosemide] Swelling    Tongue swelling  . Mucinex [Guaifenesin Er]     Tongue swelling  . Amoxicillin-Pot Clavulanate Itching  . Morphine And Related Nausea And Vomiting  . Penicillins Hives    Social History: The patient  reports that he quit smoking about 39 years ago. His smoking use included cigarettes. He has a 15.00 pack-year smoking history. He has never used smokeless tobacco. He reports current alcohol use of about 7.0 standard drinks of alcohol per week. He reports that he does not use drugs.   Family History: The patient's family history includes Aneurysm in his father; Cancer in his brother, daughter, and mother; Colon cancer in his mother; Heart attack in his brother; Heart disease in his brother; Prostate cancer in his brother.   Review of Systems: Please see the history of present illness.   All other systems are reviewed and negative.   Physical Exam: VS:  BP 122/80   Pulse 73   Ht 5\' 6"  (1.676 m)   Wt 196 lb (88.9 kg)   SpO2 95%   BMI 31.64 kg/m  .  BMI Body mass index is 31.64 kg/m.  Wt Readings from Last 3 Encounters:  09/14/20 196 lb (88.9 kg)   07/30/20 190 lb (86.2 kg)  06/13/20 198 lb (89.8 kg)    General: Pleasant. Alert and in no acute distress.   Cardiac: Regular rate and rhythm. Harsh outflow murmur.  No edema.  Respiratory:  Lungs with rales in the bases bilaterally noted.  GI: Soft and nontender.  MS: No deformity or atrophy. Gait and ROM intact.  Skin: Warm and dry. Color is normal.  Neuro:  Strength and sensation are intact and no gross focal deficits noted.  Psych: Alert, appropriate and with normal affect.   LABORATORY DATA:  EKG:  EKG is not ordered today.    Lab Results  Component Value Date   WBC 7.2 03/01/2020   HGB 15.7 03/01/2020   HCT 47.0 03/01/2020   PLT 165 03/01/2020   GLUCOSE 104 (H) 04/04/2020   CHOL 177 01/14/2019   TRIG 145 01/14/2019   HDL 39 (L) 01/14/2019   LDLCALC 109 (H) 01/14/2019   ALT 14 03/01/2020   AST 24 03/01/2020   NA 139 04/04/2020   K 4.9 04/04/2020   CL 100 04/04/2020   CREATININE 1.57 (H) 04/04/2020   BUN 28 04/04/2020   CO2 26 04/04/2020   TSH 4.030 03/01/2020   INR 1.25 03/23/2014   HGBA1C 6.3 (H) 03/19/2014     BNP (last 3 results) No results for input(s): BNP in the last 8760 hours.  ProBNP (last 3 results) No results for input(s): PROBNP in the last 8760 hours.   Other Studies Reviewed Today:  ECHO Study Conclusions 10/2016   - Left ventricle: The cavity size was normal. Wall thickness was   increased in a pattern of moderate LVH. Systolic function was   normal. The estimated ejection fraction was in the range of 50%   to 55%. Wall motion was normal; there were  no regional wall   motion abnormalities. Doppler parameters are consistent with   abnormal left ventricular relaxation (grade 1 diastolic   dysfunction). Doppler parameters are consistent with high   ventricular filling pressure. - Aortic valve: A transcatheter aortic valve bioprosthesis was   present. There was severe stenosis. Peak velocity (S): 413 cm/s.   Mean gradient (S): 41 mm Hg.  Valve area (VTI): 0.59 cm^2. Valve   area (Vmax): 0.53 cm^2. Valve area (Vmean): 0.57 cm^2. - Mitral valve: Transvalvular velocity was within the normal range.   There was no evidence for stenosis. There was mild regurgitation. - Right ventricle: The cavity size was normal. Wall thickness was   normal. Systolic function was normal. - Atrial septum: No defect or patent foramen ovale was identified   by color flow Doppler. - Tricuspid valve: There was trivial regurgitation. - Pulmonary arteries: Systolic pressure was within the normal   range. PA peak pressure: 15 mm Hg (S).   Impressions:   - There is a TAVR bioprosthesis inside a bioprosthetic aortic   valve. Since the echo 10/27/15, the mean gradient of the TAVR has   increased from 35 mmHg to 41 mmHg, conistent with severe   stenosis.       ASSESSMENT & PLAN:      1. Severe AS - prior TAVR - valve in valve - no other options available to him - I think he has progressive AS - I have not seen a reason to update his echo since we know there are no further options for him - he is agreeable to this - he is on chronic diuretics - he is only taking 1/2 dose - increasing this back to a whole tablet today. Conservative/supportive care is the focus.   2. Prior hypotension - BP is fine.   3. HLD - off therapy now. No reason to recheck.   4. Depression - back on Paxil - he seems better today - more content with his situation.    5. Advanced age - he still just wishes to die - he is ready.    6. COVID 19 - no plans to get vaccinated.   Current medicines are reviewed with the patient today.  The patient does not have concerns regarding medicines other than what has been noted above.  The following changes have been made:  See above.  Labs/ tests ordered today include:   No orders of the defined types were placed in this encounter.    Disposition:   FU with Richardson Dopp, PA in a few weeks with Dr. Burt Knack.Probable BMET on return.  We've  had a good last visit today.    Patient is agreeable to this plan and will call if any problems develop in the interim.   SignedTruitt Merle, NP  09/14/2020 3:05 PM  [facility] [address] [place], [place]  [address] Phone: [phone] Fax: [phone]

## 2020-09-01 ENCOUNTER — Other Ambulatory Visit: Payer: Self-pay

## 2020-09-01 ENCOUNTER — Ambulatory Visit (INDEPENDENT_AMBULATORY_CARE_PROVIDER_SITE_OTHER): Payer: Medicare Other | Admitting: Ophthalmology

## 2020-09-01 ENCOUNTER — Encounter (INDEPENDENT_AMBULATORY_CARE_PROVIDER_SITE_OTHER): Payer: Self-pay | Admitting: Ophthalmology

## 2020-09-01 DIAGNOSIS — H353212 Exudative age-related macular degeneration, right eye, with inactive choroidal neovascularization: Secondary | ICD-10-CM

## 2020-09-01 DIAGNOSIS — H35371 Puckering of macula, right eye: Secondary | ICD-10-CM | POA: Diagnosis not present

## 2020-09-01 DIAGNOSIS — H353133 Nonexudative age-related macular degeneration, bilateral, advanced atrophic without subfoveal involvement: Secondary | ICD-10-CM | POA: Diagnosis not present

## 2020-09-01 DIAGNOSIS — H353211 Exudative age-related macular degeneration, right eye, with active choroidal neovascularization: Secondary | ICD-10-CM

## 2020-09-01 NOTE — Assessment & Plan Note (Signed)
Continue to monitor for signs of CNVM

## 2020-09-01 NOTE — Assessment & Plan Note (Signed)
Minor epiretinal membrane not impactful on acuity will observe

## 2020-09-01 NOTE — Assessment & Plan Note (Signed)
Right eye with no active CNVM recurrence, today at 4 months follow-up.  This means 2.5 months without therapy we will simply observe and monitor for now

## 2020-09-01 NOTE — Progress Notes (Signed)
09/01/2020     CHIEF COMPLAINT Patient presents for Retina Follow Up (4 month Wet AMD f\u. Possible Avastin OD. OCT/Pt states vision has been stable. Denies complaints. )   HISTORY OF PRESENT ILLNESS: [name] [name] is a 87 y.o. male who presents to the clinic today for:   HPI    Retina Follow Up    Patient presents with  Wet AMD.  In right eye.  Severity is moderate.  Duration of 4 months.  Since onset it is stable.  I, the attending physician,  performed the HPI with the patient and updated documentation appropriately. Additional comments: 4 month Wet AMD f\u. Possible Avastin OD. OCT Pt states vision has been stable. Denies complaints.        Last edited by Tilda Franco on 09/01/2020  2:17 PM. (History)      Referring physician: No referring provider defined for this encounter.  HISTORICAL INFORMATION:   Selected notes from the MEDICAL RECORD NUMBER    Lab Results  Component Value Date   HGBA1C 6.3 (H) 03/19/2014     CURRENT MEDICATIONS: No current outpatient medications on file. (Ophthalmic Drugs)   No current facility-administered medications for this visit. (Ophthalmic Drugs)   Current Outpatient Medications (Other)  Medication Sig  . acetaminophen (TYLENOL) 500 MG tablet Take 1,000 mg by mouth every 4 (four) hours as needed.  Marland Kitchen aspirin 81 MG tablet Take 81 mg by mouth daily.  . B Complex-C (SUPER B COMPLEX) TABS Take 1 tablet by mouth daily.  . clindamycin (CLEOCIN) 150 MG capsule TK 4 CS PO PRIOR TO DENTAL APPOINTMENT  . finasteride (PROSCAR) 5 MG tablet Take 1 tablet (5 mg total) by mouth daily.  Marland Kitchen ipratropium (ATROVENT) 0.03 % nasal spray Place 2 sprays into both nostrils as needed.  Marland Kitchen levothyroxine (SYNTHROID) 50 MCG tablet Take 1 tablet (50 mcg total) by mouth daily.  . metoprolol tartrate (LOPRESSOR) 25 MG tablet Take 1 tablet (25 mg total) by mouth 2 (two) times daily.  . Multiple Vitamins-Minerals (PRESERVISION AREDS 2 PO) Take 1 capsule by mouth 2  (two) times daily.   Marland Kitchen PARoxetine (PAXIL) 20 MG tablet Take 0.5 tablets (10 mg total) by mouth every morning.  . potassium chloride (MICRO-K) 10 MEQ CR capsule Take 1 capsule (10 mEq total) by mouth daily.  Marland Kitchen torsemide (DEMADEX) 20 MG tablet Take 1 tablet (20 mg total) by mouth daily.   No current facility-administered medications for this visit. (Other)      REVIEW OF SYSTEMS:    ALLERGIES Allergies  Allergen Reactions  . Lasix [Furosemide] Swelling    Tongue swelling  . Mucinex [Guaifenesin Er]     Tongue swelling  . Amoxicillin-Pot Clavulanate Itching  . Morphine And Related Nausea And Vomiting  . Penicillins Hives    PAST MEDICAL HISTORY Past Medical History:  Diagnosis Date  . AAA (abdominal aortic aneurysm) ([place])    s/p endovascular repair 2004  . Aortic insufficiency    Prosthetic valve dysfunction  . Aortic stenosis    s/p AVR in 2003, bovine  . Arthritis   . BCC (basal cell carcinoma) 09/17/2012  . BPH (benign prostatic hyperplasia)   . CAD (coronary artery disease)    stents in [place] and has a totally occluded RCA  . Chicken pox   . CVA (cerebral infarction) 12/09  . Depression   . Diverticulosis   . Glaucoma    recently told by Dr Janyth Contes he did not have it  .  HTN (hypertension)   . Hyperlipemia   . Leaky heart valve   . Measles   . Mumps   . Myocardial infarction ([place])   . Personal history of poliomyelitis 09/17/2012   In childhood  . Pneumonia    hx of  . Polio   . Prosthetic valve dysfunction    Aortic stenosis and insufficiency   . Retinal hemorrhage of right eye 11/02/2019  . Right iliac artery stenosis (HCC)   . 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 Shortness of breath    exertion, orthopnea  . Sleeping difficulties   . Stroke [facility])    no residual  . Thyroid disease    Past Surgical History:  Procedure Laterality Date   . ABDOMINAL AORTIC ANEURYSM REPAIR  2004   s/p endovascular repiar of AAA in 2004  . AORTIC VALVE REPLACEMENT  2003   #28mm pericardial valve   . APPENDECTOMY    . CARDIAC CATHETERIZATION    . CORONARY ANGIOPLASTY    . CORONARY ARTERY BYPASS GRAFT    . CORONARY STENT PLACEMENT    . INTRAOPERATIVE TRANSESOPHAGEAL ECHOCARDIOGRAM N/A 03/23/2014   Procedure: INTRAOPERATIVE TRANSESOPHAGEAL ECHOCARDIOGRAM;  Surgeon: Sherren Mocha, MD;  Location: [facility] OR;  Service: Open Heart Surgery;  Laterality: N/A;  . JOINT REPLACEMENT    . LEFT AND RIGHT HEART CATHETERIZATION WITH CORONARY ANGIOGRAM N/A 02/08/2014   Procedure: LEFT AND RIGHT HEART CATHETERIZATION WITH CORONARY ANGIOGRAM;  Surgeon: Blane Ohara, MD;  Location: [facility] CATH LAB;  Service: Cardiovascular;  Laterality: N/A;  . TEE WITHOUT CARDIOVERSION N/A 09/02/2013   Procedure: TRANSESOPHAGEAL ECHOCARDIOGRAM (TEE);  Surgeon: Dorothy Spark, MD;  Location: [place];  Service: Cardiovascular;  Laterality: N/A;  . TONSILLECTOMY    . TRANSCATHETER AORTIC VALVE REPLACEMENT, TRANSFEMORAL N/A 03/23/2014   Procedure: TRANSCATHETER AORTIC VALVE REPLACEMENT, TRANSFEMORAL;  Surgeon: Sherren Mocha, MD;  Location: [place];  Service: Open Heart Surgery;  Laterality: N/A;  valve in valve TAVR  . WISDOM TOOTH EXTRACTION      FAMILY HISTORY Family History  Problem Relation Age of Onset  . Colon cancer Mother   . Cancer Mother   . Aneurysm Father   . Heart attack Brother        CHF  . Prostate cancer Brother   . Cancer Brother   . Heart disease Brother   . Cancer Daughter     SOCIAL HISTORY Social History   Tobacco Use  . Smoking status: Former Smoker    Packs/day: 0.50    Years: 30.00    Pack years: 15.00    Types: Cigarettes    Quit date: 10/04/1980    Years since quitting: 39.9  . Smokeless tobacco: Never Used  Vaping Use  . Vaping Use: Never used  Substance Use Topics  . Alcohol use: Yes    Alcohol/week: 7.0 standard drinks     Types: 7 Shots of liquor per week    Comment: cocktails/night  . Drug use: No         OPHTHALMIC EXAM: Base Eye Exam    Visual Acuity (Snellen - Linear)      Right Left   Dist cc 20/30 -1 20/40   Dist ph cc  NI       Tonometry (Tonopen, 2:22 PM)      Right Left   Pressure 14 11       Pupils  Pupils Dark Light Shape React APD   Right PERRL 4 3 Round Brisk None   Left PERRL 4 3 Round Brisk None       Visual Fields (Counting fingers)      Left Right    Full    Restrictions  Partial outer superior temporal, superior nasal deficiencies       Neuro/Psych    Oriented x3: Yes   Mood/Affect: Normal       Dilation    Both eyes: 1.0% Mydriacyl, 2.5% Phenylephrine @ 2:22 PM        Slit Lamp and Fundus Exam    External Exam      Right Left   External Normal Normal       Slit Lamp Exam      Right Left   Lids/Lashes Normal Normal   Conjunctiva/Sclera White and quiet White and quiet   Cornea Clear Clear   Anterior Chamber Deep and quiet Deep and quiet   Iris Round and reactive Round and reactive   Lens Posterior chamber intraocular lens Posterior chamber intraocular lens   Anterior Vitreous Normal Normal       Fundus Exam      Right Left   Posterior Vitreous Clear Clear   Disc Pericapillary atrophy. Normal   C/D Ratio 0.6 0.55   Macula Age related macular degeneration, Atrophy,  Advanced age related macular degeneration, [place], minor epiretinal membrane nasal to the fovea. Age related macular degeneration, Atrophy,  Advanced age related macular degeneration, [place]   Vessels Normal Normal   Periphery Normal Normal          IMAGING AND PROCEDURES  Imaging and Procedures for 09/01/20  OCT, Retina - OU - Both Eyes       Right Eye Quality was good. Scan locations included subfoveal. Central Foveal Thickness: 302. Progression has been stable. Findings include epiretinal membrane, central retinal atrophy, outer retinal atrophy, subretinal scarring.    Left Eye Quality was good. Scan locations included subfoveal. Central Foveal Thickness: 284. Findings include central retinal atrophy, outer retinal atrophy.                 ASSESSMENT/PLAN:  Exudative age-related macular degeneration of right eye with inactive choroidal neovascularization (HCC) Right eye with no active CNVM recurrence, today at 4 months follow-up.  This means 2.5 months without therapy we will simply observe and monitor for now  Advanced nonexudative age-related macular degeneration of both eyes without subfoveal involvement Continue to monitor for signs of CNVM  Right epiretinal membrane Minor epiretinal membrane not impactful on acuity will observe      ICD-10-CM   1. 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 Exudative age-related macular degeneration of right eye with inactive choroidal neovascularization ([place])  H35.3212   3. Advanced nonexudative age-related macular degeneration of both eyes without subfoveal involvement  H35.3133   4. Right epiretinal membrane  H35.371     1.  No signs of active CNVM or recurrence OS or OD, will observe today and return follow-up in 3 months  2.  Epiretinal membrane.  Minor OD will observe  3.  Ophthalmic Meds Ordered this visit:  No orders of the defined types were placed in this encounter.      Return in about 3 months (around 11/29/2020) for DILATE OU, OCT.  There are no Patient Instructions on file for this visit.   Explained the  diagnoses, plan, and follow up with the patient and they expressed understanding.  Patient expressed understanding of the importance of proper follow up care.   Clent Demark Gifford Ballon M.D. Diseases & Surgery of the Retina and Vitreous [facility] [place] 09/01/20     Abbreviations: M myopia (nearsighted); A astigmatism; H hyperopia (farsighted); P presbyopia; Mrx  spectacle prescription;  CTL contact lenses; OD right eye; OS left eye; OU both eyes  XT exotropia; ET esotropia; PEK punctate epithelial keratitis; PEE punctate epithelial erosions; DES dry eye syndrome; MGD meibomian gland dysfunction; ATs artificial tears; PFAT's preservative free artificial tears; [place] nuclear sclerotic cataract; PSC posterior subcapsular cataract; ERM epi-retinal membrane; PVD posterior vitreous detachment; RD retinal detachment; DM diabetes mellitus; DR diabetic retinopathy; NPDR non-proliferative diabetic retinopathy; PDR proliferative diabetic retinopathy; CSME clinically significant macular edema; DME diabetic macular edema; dbh dot blot hemorrhages; CWS cotton wool spot; POAG primary open angle glaucoma; C/D cup-to-disc ratio; HVF humphrey visual field; GVF goldmann visual field; OCT optical coherence tomography; IOP intraocular pressure; BRVO Branch retinal vein occlusion; CRVO central retinal vein occlusion; CRAO central retinal artery occlusion; BRAO branch retinal artery occlusion; RT retinal tear; SB scleral buckle; PPV pars plana vitrectomy; VH Vitreous hemorrhage; PRP panretinal laser photocoagulation; IVK intravitreal kenalog; VMT vitreomacular traction; MH Macular hole;  NVD neovascularization of the disc; NVE neovascularization elsewhere; AREDS age related eye disease study; ARMD age related macular degeneration; POAG primary open angle glaucoma; EBMD epithelial/anterior basement membrane dystrophy; ACIOL anterior chamber intraocular lens; IOL intraocular lens; PCIOL posterior chamber intraocular lens; Phaco/IOL phacoemulsification with intraocular lens placement; [place] photorefractive keratectomy; LASIK laser assisted in situ keratomileusis; HTN hypertension; DM diabetes mellitus; COPD chronic obstructive pulmonary disease

## 2020-09-14 ENCOUNTER — Other Ambulatory Visit: Payer: Self-pay

## 2020-09-14 ENCOUNTER — Encounter: Payer: Self-pay | Admitting: Nurse Practitioner

## 2020-09-14 ENCOUNTER — Ambulatory Visit (INDEPENDENT_AMBULATORY_CARE_PROVIDER_SITE_OTHER): Payer: Medicare Other | Admitting: Nurse Practitioner

## 2020-09-14 VITALS — BP 122/80 | HR 73 | Ht 66.0 in | Wt 196.0 lb

## 2020-09-14 DIAGNOSIS — Z952 Presence of prosthetic heart valve: Secondary | ICD-10-CM

## 2020-09-14 DIAGNOSIS — I5032 Chronic diastolic (congestive) heart failure: Secondary | ICD-10-CM

## 2020-09-14 DIAGNOSIS — R0600 Dyspnea, unspecified: Secondary | ICD-10-CM

## 2020-09-14 DIAGNOSIS — I259 Chronic ischemic heart disease, unspecified: Secondary | ICD-10-CM

## 2020-09-14 DIAGNOSIS — I38 Endocarditis, valve unspecified: Secondary | ICD-10-CM | POA: Diagnosis not present

## 2020-09-14 NOTE — Patient Instructions (Addendum)
After Visit Summary:  We will be checking the following labs today - NONE   Medication Instructions:    Continue with your current medicines.   Take the whole dose of Torsemide   If you need a refill on your cardiac medications before your next appointment, please call your pharmacy.     Testing/Procedures To Be Arranged:  N/A  Follow-Up:   See Richardson Dopp, PA with Dr. Burt Knack in about 4 to 6 weeks.     At [facility], you and your health needs are our priority.  As part of our continuing mission to provide you with exceptional heart care, we have created designated Provider Care Teams.  These Care Teams include your primary Cardiologist (physician) and Advanced Practice Providers (APPs -  Physician Assistants and Nurse Practitioners) who all work together to provide you with the care you need, when you need it.  Special Instructions:  . Stay safe, wash your hands for at least 20 seconds and wear a mask when needed.  . It was good to talk with you today.    Call the [place] office at [phone] if you have any questions, problems or concerns.

## 2020-09-27 ENCOUNTER — Telehealth: Payer: Self-pay | Admitting: *Deleted

## 2020-09-27 ENCOUNTER — Other Ambulatory Visit: Payer: Self-pay | Admitting: *Deleted

## 2020-09-27 MED ORDER — PAROXETINE HCL 20 MG PO TABS: 10.0000 mg | ORAL_TABLET | ORAL | 0 refills | Status: DC

## 2020-09-27 NOTE — Telephone Encounter (Signed)
S/w pt been calling in to get 5 tablets on paxil.  Nicki Reaper does not usually order this but pt is waiting for mail orders. Sent in today to requested pharmacy.

## 2020-10-31 NOTE — Progress Notes (Signed)
Cardiology Office Note:    Date:  11/01/2020   ID:  [name] [name], DOB September 16, 1922, MRN [id number]  PCP:  Patient, No Pcp Per (Inactive)   [place]  Cardiologist:  Sherren Mocha, MD   Advanced Practice Provider:  Liliane Shi, PA-C Electrophysiologist:  None       Referring MD: No ref. provider found   Chief Complaint:  Follow-up (CHF, AV disease, CAD)    Patient Profile:     [name] [name] is a 87 y.o. male with:   Coronary artery disease   S/p remote inferior MI  Hx of stent to LCx; know CTO of RCA  Aortic valve stenosis  S/p AVR in 2003  Bioprosthetic AVR insufficiency s/p valve in valve TAVR in 9/15  According to notes - concern for progressive AS >> conservative mgmt  (HFpEF) heart failure with preserved ejection fraction   Abdominal aortic aneurysm s/p repair in 2004  Peripheral arterial disease   Hx of CVA  Hypertension   Hyperlipidemia   BPH   Hypothyroidism   Prior CV studies: Echocardiogram 11/07/16 Moderate LVH, EF 50-55, no RWMA, GR 1 DD, s/p TAVR with severe AS (mean gradient 41 mmHg), mild MR, normal RVSF, trivial TR, PASP 15  Cardiac catheterization 02/08/14 LM patent LAD patent  LCx mid 40, mid stent ok RCA mid 95 (small vessel)    History of Present Illness:    [name] was last seen by Truitt Merle, NP in 08/2020.  He returns for f/u.  He is here with his son over time, he has felt that he is getting more and more short of breath with exertion.  He often has to sleep sitting up.  He does have some lower extremity swelling.  He has not had chest pain or syncope.  He does not weigh himself on a regular basis.        Past Medical History:  Diagnosis Date  . AAA (abdominal aortic aneurysm) ([place])    s/p endovascular repair 2004  . Aortic insufficiency    Prosthetic valve dysfunction  . Aortic stenosis    s/p AVR in 2003, bovine  . Arthritis   . BCC (basal cell carcinoma) 09/17/2012  . BPH (benign  prostatic hyperplasia)   . CAD (coronary artery disease)    stents in [place] and has a totally occluded RCA  . Chicken pox   . CVA (cerebral infarction) 12/09  . Depression   . Diverticulosis   . Glaucoma    recently told by Dr Janyth Contes he did not have it  . HTN (hypertension)   . Hyperlipemia   . Leaky heart valve   . Measles   . Mumps   . Myocardial infarction ([place])   . Personal history of poliomyelitis 09/17/2012   In childhood  . Pneumonia    hx of  . Polio   . Prosthetic valve dysfunction    Aortic stenosis and insufficiency   . Retinal hemorrhage of right eye 11/02/2019  . Right iliac artery stenosis (HCC)   . 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 Shortness of breath    exertion, orthopnea  . Sleeping difficulties   . Stroke [facility])    no residual  . Thyroid disease     Current Medications: Current Meds  Medication Sig  . acetaminophen (TYLENOL) 500 MG tablet Take 1,000 mg by mouth every  4 (four) hours as needed.  Marland Kitchen aspirin 81 MG tablet Take 81 mg by mouth daily.  . B Complex-C (SUPER B COMPLEX) TABS Take 1 tablet by mouth daily.  . clindamycin (CLEOCIN) 150 MG capsule TK 4 CS PO PRIOR TO DENTAL APPOINTMENT  . finasteride (PROSCAR) 5 MG tablet Take 1 tablet (5 mg total) by mouth daily.  Marland Kitchen ipratropium (ATROVENT) 0.03 % nasal spray Place 2 sprays into both nostrils as needed.  Marland Kitchen levothyroxine (SYNTHROID) 50 MCG tablet Take 1 tablet (50 mcg total) by mouth daily.  . metoprolol tartrate (LOPRESSOR) 25 MG tablet Take 1 tablet (25 mg total) by mouth 2 (two) times daily.  . Multiple Vitamins-Minerals (PRESERVISION AREDS 2 PO) Take 1 capsule by mouth 2 (two) times daily.   Marland Kitchen PARoxetine (PAXIL) 20 MG tablet Take 0.5 tablets (10 mg total) by mouth every morning.  . potassium chloride (MICRO-K) 10 MEQ CR capsule Take 1 capsule (10 mEq total) by mouth daily.  Marland Kitchen  torsemide (DEMADEX) 20 MG tablet Take 1 tablet (20 mg total) by mouth daily.     Allergies:   Lasix [furosemide], Mucinex [guaifenesin er], Amoxicillin-pot clavulanate, Morphine and related, and Penicillins   Social History   Tobacco Use  . Smoking status: Former Smoker    Packs/day: 0.50    Years: 30.00    Pack years: 15.00    Types: Cigarettes    Quit date: 10/04/1980    Years since quitting: 40.1  . Smokeless tobacco: Never Used  Vaping Use  . Vaping Use: Never used  Substance Use Topics  . Alcohol use: Yes    Alcohol/week: 7.0 standard drinks    Types: 7 Shots of liquor per week    Comment: cocktails/night  . Drug use: No     Family Hx: The patient's family history includes Aneurysm in his father; Cancer in his brother, daughter, and mother; Colon cancer in his mother; Heart attack in his brother; Heart disease in his brother; Prostate cancer in his brother.  ROS   EKGs/Labs/Other Test Reviewed:    EKG:  EKG is not ordered today.  The ekg ordered today demonstrates n/a  Recent Labs: 03/01/2020: ALT 14; Hemoglobin 15.7; Platelets 165; TSH 4.030 04/04/2020: BUN 28; Creatinine, Ser 1.57; Potassium 4.9; Sodium 139   Recent Lipid Panel Lab Results  Component Value Date/Time   CHOL 177 01/14/2019 11:46 AM   TRIG 145 01/14/2019 11:46 AM   HDL 39 (L) 01/14/2019 11:46 AM   CHOLHDL 4.5 01/14/2019 11:46 AM   CHOLHDL 4.9 04/17/2016 11:42 AM   LDLCALC 109 (H) 01/14/2019 11:46 AM      Risk Assessment/Calculations:      Physical Exam:    VS:  BP 128/80   Pulse 72   Ht 5\' 6"  (1.676 m)   Wt 198 lb 6.4 oz (90 kg)   SpO2 91%   BMI 32.02 kg/m     Wt Readings from Last 3 Encounters:  11/01/20 198 lb 6.4 oz (90 kg)  09/14/20 196 lb (88.9 kg)  07/30/20 190 lb (86.2 kg)     Constitutional:      Appearance: Healthy appearance. Not in distress.  Neck:     Vascular: JVD normal.  Pulmonary:     Effort: Pulmonary effort is normal.     Breath sounds: No wheezing. No  rales.  Cardiovascular:     Normal rate. Regular rhythm. Normal S1. Normal S2.     Murmurs: There is a grade 3/6 crescendo-decrescendo systolic murmur at  the URSB.  Edema:    Pretibial: bilateral 1+ edema of the pretibial area. Abdominal:     General: There is distension.     Palpations: Abdomen is soft.  Skin:    General: Skin is warm and dry.  Neurological:     General: No focal deficit present.     Mental Status: Alert and oriented to person, place and time.     Cranial Nerves: Cranial nerves are intact.        ASSESSMENT & PLAN:    1. Chronic heart failure with preserved ejection fraction (HCC) EF 50-55 by echocardiogram in 4/18.  NYHA IIb-III.  He has evidence of prosthetic aortic valve stenosis.  I suspect that this is making his heart failure harder to manage.  He has a history of allergic reaction to furosemide.  I have asked him to increase torsemide to 20 mg daily.  Obtain follow-up BMET in 1 week.  We discussed the possibility of obtaining a follow-up echocardiogram.  He is not really a candidate for surgical intervention or redo TAVR.  If his EF is down, we could consider titrating his heart failure medications to make him feel better.  At this point, I have recommended adjustments in his diuretic.  If his breathing continues to worsen, we can certainly reconsider repeating his echocardiogram.  2. S/P TAVR (transcatheter aortic valve replacement) 3. Nonrheumatic aortic valve stenosis His original AVR was in 2003.  He had bioprosthetic AVR insufficiency and underwent valve in valve TAVR in 2015.  He has had progressive aortic stenosis which has been managed conservatively.  As noted, we discussed plus/minus repeat echocardiogram.  For now, we will continue with medication adjustments and reconsider repeating his echocardiogram at next visit.  Continue SBE prophylaxis.  4. Coronary artery disease involving native coronary artery of native heart without angina pectoris History of  remote inferior MI.  His LCx stent was patent by last cardiac catheterization in 2015.  He is not having anginal symptoms.  Continue aspirin.  5. Essential hypertension The patient's blood pressure is controlled on his current regimen.  Continue current therapy.          Dispo:  Return in about 3 months (around 01/31/2021) for Routine 3 month follow up with Richardson Dopp, PA.   Medication Adjustments/Labs and Tests Ordered: Current medicines are reviewed at length with the patient today.  Concerns regarding medicines are outlined above.  Tests Ordered: Orders Placed This Encounter  Procedures  . Basic Metabolic Panel (BMET)   Medication Changes: No orders of the defined types were placed in this encounter.   Signed, Richardson Dopp, PA-C  11/01/2020 3:03 PM    [facility] [address], [place], [place]  [address] Phone: [phone]; Fax: [phone]

## 2020-11-01 ENCOUNTER — Ambulatory Visit (INDEPENDENT_AMBULATORY_CARE_PROVIDER_SITE_OTHER): Payer: Medicare Other | Admitting: Physician Assistant

## 2020-11-01 ENCOUNTER — Encounter: Payer: Self-pay | Admitting: Physician Assistant

## 2020-11-01 ENCOUNTER — Other Ambulatory Visit: Payer: Self-pay

## 2020-11-01 VITALS — BP 128/80 | HR 72 | Ht 66.0 in | Wt 198.4 lb

## 2020-11-01 DIAGNOSIS — I350 Nonrheumatic aortic (valve) stenosis: Secondary | ICD-10-CM | POA: Diagnosis not present

## 2020-11-01 DIAGNOSIS — E7800 Pure hypercholesterolemia, unspecified: Secondary | ICD-10-CM

## 2020-11-01 DIAGNOSIS — Z952 Presence of prosthetic heart valve: Secondary | ICD-10-CM

## 2020-11-01 DIAGNOSIS — I10 Essential (primary) hypertension: Secondary | ICD-10-CM

## 2020-11-01 DIAGNOSIS — I5032 Chronic diastolic (congestive) heart failure: Secondary | ICD-10-CM | POA: Diagnosis not present

## 2020-11-01 DIAGNOSIS — I2510 Atherosclerotic heart disease of native coronary artery without angina pectoris: Secondary | ICD-10-CM

## 2020-11-01 DIAGNOSIS — I259 Chronic ischemic heart disease, unspecified: Secondary | ICD-10-CM

## 2020-11-01 NOTE — Patient Instructions (Signed)
Medication Instructions:  Your physician recommends that you continue on your current medications as directed. Please refer to the Current Medication list given to you today.  *If you need a refill on your cardiac medications before your next appointment, please call your pharmacy*   Lab Work: Your physician recommends that you return for lab work on 4/20 between 7:30 - 4:30. If you have labs (blood work) drawn today and your tests are completely normal, you will receive your results only by: Marland Kitchen MyChart Message (if you have MyChart) OR . A paper copy in the mail If you have any lab test that is abnormal or we need to change your treatment, we will call you to review the results.   Testing/Procedures: -None   Follow-Up: At [facility], you and your health needs are our priority.  As part of our continuing mission to provide you with exceptional heart care, we have created designated Provider Care Teams.  These Care Teams include your primary Cardiologist (physician) and Advanced Practice Providers (APPs -  Physician Assistants and Nurse Practitioners) who all work together to provide you with the care you need, when you need it.  We recommend signing up for the patient portal called "MyChart".  Sign up information is provided on this After Visit Summary.  MyChart is used to connect with patients for Virtual Visits (Telemedicine).  Patients are able to view lab/test results, encounter notes, upcoming appointments, etc.  Non-urgent messages can be sent to your provider as well.   To learn more about what you can do with MyChart, go to [email or web address].    Your next appointment:   3 month(s)  The format for your next appointment:   In Person  Provider:   Richardson Dopp, PA-C   Other Instructions  Weigh daily if wt is up 3 lbs in 24 hours call office.

## 2020-11-08 ENCOUNTER — Other Ambulatory Visit: Payer: Self-pay | Admitting: *Deleted

## 2020-11-08 ENCOUNTER — Other Ambulatory Visit: Payer: Self-pay

## 2020-11-08 ENCOUNTER — Other Ambulatory Visit: Payer: Medicare Other | Admitting: *Deleted

## 2020-11-08 DIAGNOSIS — I5032 Chronic diastolic (congestive) heart failure: Secondary | ICD-10-CM

## 2020-11-08 DIAGNOSIS — I2510 Atherosclerotic heart disease of native coronary artery without angina pectoris: Secondary | ICD-10-CM

## 2020-11-08 DIAGNOSIS — I350 Nonrheumatic aortic (valve) stenosis: Secondary | ICD-10-CM

## 2020-11-08 DIAGNOSIS — Z952 Presence of prosthetic heart valve: Secondary | ICD-10-CM

## 2020-11-08 DIAGNOSIS — I10 Essential (primary) hypertension: Secondary | ICD-10-CM

## 2020-11-09 ENCOUNTER — Other Ambulatory Visit: Payer: Medicare Other

## 2020-11-09 ENCOUNTER — Telehealth: Payer: Self-pay | Admitting: *Deleted

## 2020-11-09 DIAGNOSIS — I5032 Chronic diastolic (congestive) heart failure: Secondary | ICD-10-CM

## 2020-11-09 LAB — BASIC METABOLIC PANEL
BUN/Creatinine Ratio: 17 (ref 10–24)
BUN: 30 mg/dL (ref 10–36)
CO2: 24 mmol/L (ref 20–29)
Calcium: 9 mg/dL (ref 8.6–10.2)
Chloride: 102 mmol/L (ref 96–106)
Creatinine, Ser: 1.8 mg/dL — ABNORMAL HIGH (ref 0.76–1.27)
Glucose: 113 mg/dL — ABNORMAL HIGH (ref 65–99)
Potassium: 4.4 mmol/L (ref 3.5–5.2)
Sodium: 143 mmol/L (ref 134–144)
eGFR: 34 mL/min/{1.73_m2} — ABNORMAL LOW (ref >59)

## 2020-11-09 NOTE — Telephone Encounter (Signed)
Spoke with patient about increase in creatinine and the physician wants to continue torsemide 20 mg and come in on Wednesday, April 27 for another bmet.  Patient verbalized understanding and will be here on Wednesday.  Bmet ordered. Patient encouraged to increase fluid intake.  Patient verbalized understanding.

## 2020-11-09 NOTE — Telephone Encounter (Signed)
-----   Message from Theora Gianotti, NP sent at 11/09/2020 12:15 PM EDT ----- Since increasing torsemide, creatinine up slightly.  Lytes ok. Given report of heart failure symptoms when he saw [facility] 4/12, I recommend continuing current dose of torsemide (20mg  daily), increasing oral hydration, and f/u BMET in 1 wk.

## 2020-11-15 ENCOUNTER — Telehealth: Payer: Self-pay | Admitting: *Deleted

## 2020-11-15 NOTE — Telephone Encounter (Signed)
Pt's daughter calling in to cancel pt's lab appt. Creat was elevated a bit on last lab result.  Pt is set in his way and stated Cecille Rubin never made him come in.  Pt is not going to hydrate, Daughter stated will tell pt to hydrate but pt does not. Will send to [place] to [place].

## 2020-11-16 ENCOUNTER — Other Ambulatory Visit: Payer: Medicare Other

## 2020-12-01 ENCOUNTER — Encounter (INDEPENDENT_AMBULATORY_CARE_PROVIDER_SITE_OTHER): Payer: Self-pay | Admitting: Ophthalmology

## 2020-12-01 ENCOUNTER — Encounter (INDEPENDENT_AMBULATORY_CARE_PROVIDER_SITE_OTHER): Payer: Medicare Other | Admitting: Ophthalmology

## 2020-12-01 ENCOUNTER — Ambulatory Visit (INDEPENDENT_AMBULATORY_CARE_PROVIDER_SITE_OTHER): Payer: Medicare Other | Admitting: Ophthalmology

## 2020-12-01 ENCOUNTER — Other Ambulatory Visit: Payer: Self-pay

## 2020-12-01 DIAGNOSIS — I259 Chronic ischemic heart disease, unspecified: Secondary | ICD-10-CM

## 2020-12-01 DIAGNOSIS — H35371 Puckering of macula, right eye: Secondary | ICD-10-CM

## 2020-12-01 DIAGNOSIS — H353133 Nonexudative age-related macular degeneration, bilateral, advanced atrophic without subfoveal involvement: Secondary | ICD-10-CM

## 2020-12-01 DIAGNOSIS — H353211 Exudative age-related macular degeneration, right eye, with active choroidal neovascularization: Secondary | ICD-10-CM | POA: Diagnosis not present

## 2020-12-01 DIAGNOSIS — H353212 Exudative age-related macular degeneration, right eye, with inactive choroidal neovascularization: Secondary | ICD-10-CM | POA: Diagnosis not present

## 2020-12-01 MED ORDER — BEVACIZUMAB 2.5 MG/0.1ML IZ SOSY
2.5000 mg | PREFILLED_SYRINGE | INTRAVITREAL | Status: AC | PRN
  Administered 2020-12-01: 2.5 mg via INTRAVITREAL

## 2020-12-01 NOTE — Progress Notes (Signed)
12/01/2020     CHIEF COMPLAINT Patient presents for Retina Follow Up (3 month fu OU and OCT/Pt states, "I feel like I cannot see as well as I was in the last couple of weeks. It is mainly my NVA.")   HISTORY OF PRESENT ILLNESS: [name] [name] is a 87 y.o. male who presents to the clinic today for:   HPI    Retina Follow Up    Diagnosis: Wet AMD   Laterality: right eye   Onset: 3 months ago   Severity: mild   Duration: 3 months   Course: gradually worsening   Comments: 3 month fu OU and OCT Pt states, "I feel like I cannot see as well as I was in the last couple of weeks. It is mainly my NVA."       Last edited by Kendra Opitz, COA on 12/01/2020  3:06 PM. (History)      Referring physician: No referring provider defined for this encounter.  HISTORICAL INFORMATION:   Selected notes from the MEDICAL RECORD NUMBER    Lab Results  Component Value Date   HGBA1C 6.3 (H) 03/19/2014     CURRENT MEDICATIONS: No current outpatient medications on file. (Ophthalmic Drugs)   No current facility-administered medications for this visit. (Ophthalmic Drugs)   Current Outpatient Medications (Other)  Medication Sig  . acetaminophen (TYLENOL) 500 MG tablet Take 1,000 mg by mouth every 4 (four) hours as needed.  Marland Kitchen aspirin 81 MG tablet Take 81 mg by mouth daily.  . B Complex-C (SUPER B COMPLEX) TABS Take 1 tablet by mouth daily.  . clindamycin (CLEOCIN) 150 MG capsule TK 4 CS PO PRIOR TO DENTAL APPOINTMENT  . finasteride (PROSCAR) 5 MG tablet Take 1 tablet (5 mg total) by mouth daily.  Marland Kitchen ipratropium (ATROVENT) 0.03 % nasal spray Place 2 sprays into both nostrils as needed.  Marland Kitchen levothyroxine (SYNTHROID) 50 MCG tablet Take 1 tablet (50 mcg total) by mouth daily.  . metoprolol tartrate (LOPRESSOR) 25 MG tablet Take 1 tablet (25 mg total) by mouth 2 (two) times daily.  . Multiple Vitamins-Minerals (PRESERVISION AREDS 2 PO) Take 1 capsule by mouth 2 (two) times daily.   Marland Kitchen PARoxetine  (PAXIL) 20 MG tablet Take 0.5 tablets (10 mg total) by mouth every morning.  . potassium chloride (MICRO-K) 10 MEQ CR capsule Take 1 capsule (10 mEq total) by mouth daily.  Marland Kitchen torsemide (DEMADEX) 20 MG tablet Take 1 tablet (20 mg total) by mouth daily.   No current facility-administered medications for this visit. (Other)      REVIEW OF SYSTEMS:    ALLERGIES Allergies  Allergen Reactions  . Lasix [Furosemide] Swelling    Tongue swelling  . Mucinex [Guaifenesin Er]     Tongue swelling  . Amoxicillin-Pot Clavulanate Itching  . Morphine And Related Nausea And Vomiting  . Penicillins Hives    PAST MEDICAL HISTORY Past Medical History:  Diagnosis Date  . AAA (abdominal aortic aneurysm) ([place])    s/p endovascular repair 2004  . Aortic insufficiency    Prosthetic valve dysfunction  . Aortic stenosis    s/p AVR in 2003, bovine  . Arthritis   . BCC (basal cell carcinoma) 09/17/2012  . BPH (benign prostatic hyperplasia)   . CAD (coronary artery disease)    stents in [place] and has a totally occluded RCA  . Chicken pox   . CVA (cerebral infarction) 12/09  . Depression   . Diverticulosis   . Glaucoma  recently told by Dr Janyth Contes he did not have it  . HTN (hypertension)   . Hyperlipemia   . Leaky heart valve   . Measles   . Mumps   . Myocardial infarction ([place])   . Personal history of poliomyelitis 09/17/2012   In childhood  . Pneumonia    hx of  . Polio   . Prosthetic valve dysfunction    Aortic stenosis and insufficiency   . Retinal hemorrhage of right eye 11/02/2019  . Right iliac artery stenosis (HCC)   . 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 Shortness of breath    exertion, orthopnea  . Sleeping difficulties   . Stroke [facility])    no residual  . Thyroid disease    Past Surgical History:  Procedure Laterality Date  . ABDOMINAL AORTIC ANEURYSM REPAIR   2004   s/p endovascular repiar of AAA in 2004  . AORTIC VALVE REPLACEMENT  2003   #26mm pericardial valve   . APPENDECTOMY    . CARDIAC CATHETERIZATION    . CORONARY ANGIOPLASTY    . CORONARY ARTERY BYPASS GRAFT    . CORONARY STENT PLACEMENT    . INTRAOPERATIVE TRANSESOPHAGEAL ECHOCARDIOGRAM N/A 03/23/2014   Procedure: INTRAOPERATIVE TRANSESOPHAGEAL ECHOCARDIOGRAM;  Surgeon: Sherren Mocha, MD;  Location: [facility] OR;  Service: Open Heart Surgery;  Laterality: N/A;  . JOINT REPLACEMENT    . LEFT AND RIGHT HEART CATHETERIZATION WITH CORONARY ANGIOGRAM N/A 02/08/2014   Procedure: LEFT AND RIGHT HEART CATHETERIZATION WITH CORONARY ANGIOGRAM;  Surgeon: Blane Ohara, MD;  Location[facility] C[facility] LAB;  Service: Cardiovascular;  Laterality: N/A;  . TEE WITHOUT CARDIOVERSION N/A 09/02/2013   Procedure: TRANSESOPHAGEAL ECHOCARDIOGRAM (TEE);  Surgeon: Dorothy Spark, MD;  Location: [facility];  Service: Cardiovascular;  Laterality: N/A;  . TONSILLECTOMY    . TRANSCATHETER AORTIC VALVE REPLACEMENT, TRANSFEMORAL N/A 03/23/2014   Procedure: TRANSCATHETER AORTIC VALVE REPLACEMENT, TRANSFEMORAL;  Surgeon: Sherren Mocha, MD;  Location: [facility] [facility];  Service: Open Heart Surgery;  Laterality: N/A;  valve in valve TAVR  . WISDOM TOOTH EXTRACTION      FAMILY HISTORY Family History  Problem Relation Age of Onset  . Colon cancer Mother   . Cancer Mother   . Aneurysm Father   . Heart attack Brother        CHF  . Prostate cancer Brother   . Cancer Brother   . Heart disease Brother   . Cancer Daughter     SOCIAL HISTORY Social History   Tobacco Use  . Smoking status: Former Smoker    Packs/day: 0.50    Years: 30.00    Pack years: 15.00    Types: Cigarettes    Quit date: 10/04/1980    Years since quitting: 40.1  . Smokeless tobacco: Never Used  Vaping Use  . Vaping Use: Never used  Substance Use Topics  . Alcohol use: Yes    Alcohol/week: 7.0 standard drinks    Types: 7 Shots of liquor per week     Comment: cocktails/night  . Drug use: No         OPHTHALMIC EXAM:  Base Eye Exam    Visual Acuity (ETDRS)      Right Left   Dist cc 20/40 -2 20/60   Dist ph cc NI 20/50   Correction: Glasses       Tonometry (Tonopen, 3:09 PM)      Right  Left   Pressure 10 10       Pupils      Pupils Dark Light Shape React APD   Right PERRL 4 3 Round Brisk None   Left PERRL 4 3 Round Brisk None       Visual Fields      Left Right    Full    Restrictions  Partial outer superior temporal, superior nasal deficiencies       Extraocular Movement      Right Left    Full Full       Neuro/Psych    Oriented x3: Yes   Mood/Affect: Normal       Dilation    Both eyes: 1.0% Mydriacyl, 2.5% Phenylephrine @ 3:09 PM        Slit Lamp and Fundus Exam    External Exam      Right Left   External Normal Normal       Slit Lamp Exam      Right Left   Lids/Lashes Normal Normal   Conjunctiva/Sclera White and quiet White and quiet   Cornea Clear Clear   Anterior Chamber Deep and quiet Deep and quiet   Iris Round and reactive Round and reactive   Lens Posterior chamber intraocular lens Posterior chamber intraocular lens   Anterior Vitreous Normal Normal       Fundus Exam      Right Left   Posterior Vitreous Clear Clear   Disc Pericapillary atrophy. Normal   C/D Ratio 0.6 0.55   Macula Age related macular degeneration, Atrophy,  Advanced age related macular degeneration, [place], minor epiretinal membrane nasal to the fovea. Age related macular degeneration, Atrophy,  Advanced age related macular degeneration, Drusen   Vessels Normal Normal   Periphery Normal Normal          IMAGING AND PROCEDURES  Imaging and Procedures for 12/01/20  OCT, Retina - OU - Both Eyes       Right Eye Quality was good. Scan locations included subfoveal. Central Foveal Thickness: 302. Progression has worsened. Findings include epiretinal membrane, central retinal atrophy, outer retinal atrophy,  subretinal scarring, intraretinal fluid.   Left Eye Quality was good. Scan locations included subfoveal. Central Foveal Thickness: 273. Findings include central retinal atrophy, outer retinal atrophy.   Notes New intraretinal fluid temporal and superior to the fovea OD, will need to recommence and start intravitreal Avastin and to control this disease       Intravitreal Injection, Pharmacologic Agent - OD - Right Eye       Time Out 12/01/2020. 3:33 PM. Confirmed correct patient, procedure, site, and patient consented.   Anesthesia Topical anesthesia was used. Anesthetic medications included Akten 3.5%.   Procedure Preparation included Ofloxacin , 10% betadine to eyelids, 5% betadine to ocular surface. A 30 gauge needle was used.   Injection:  2.5 mg Bevacizumab (AVASTIN) 2.5mg /0.54mL SOSY   NDC: M6102387, [id number]   Route: Intravitreal, Site: Right Eye  Post-op Post injection exam found visual acuity of at least counting fingers. The patient tolerated the procedure well. There were no complications. The patient received written and verbal post procedure care education. Post injection medications were not given.                 ASSESSMENT/PLAN:  Exudative age-related macular degeneration of right eye with active choroidal neovascularization (HCC) New intraretinal fluid superior to the fovea, new onset since February and since last treatment October 2021.  Will need retreatment today  to prevent recurrence of CNVM and enlargement of scotoma OD.  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 Exudative age-related macular degeneration of right eye with active choroidal neovascularization (HCC)  H35.3211 OCT, Retina - OU - Both Eyes    Intravitreal Injection, Pharmacologic Agent - OD - Right Eye     bevacizumab (AVASTIN) SOSY 2.5 mg  2. Exudative age-related macular degeneration of right eye with inactive choroidal neovascularization ([place])  H35.3212   3. Advanced nonexudative age-related macular degeneration of both eyes without subfoveal involvement  H35.3133   4. Right epiretinal membrane  H35.371     1.  CNVM with intraretinal fluid recurrent today at 33-month post most recent injection OD.  We will need to repeat injection OD today and examination next again in 7 weeks  2.  3.  Ophthalmic Meds Ordered this visit:  Meds ordered this encounter  Medications  . bevacizumab (AVASTIN) SOSY 2.5 mg       Return in about 7 weeks (around 01/19/2021) for dilate, OD, AVASTIN OCT.  There are no Patient Instructions on file for this visit.   Explained the diagnoses, plan, and follow up with the patient and they expressed understanding.  Patient expressed understanding of the importance of proper follow up care.   Clent Demark Dylann Gallier M.D. Diseases & Surgery of the Retina and Vitreous [facility]ina [facility] [place] 12/01/20     Abbreviations: M myopia (nearsighted); A astigmatism; H hyperopia (farsighted); P presbyopia; Mrx spectacle prescription;  CTL contact lenses; OD right eye; OS left eye; OU both eyes  XT exotropia; ET esotropia; PEK punctate epithelial keratitis; PEE punctate epithelial erosions; DES dry eye syndrome; MGD meibomian gland dysfunction; ATs artificial tears; PFAT's preservative free artificial tears; [place] nuclear sclerotic cataract; PSC posterior subcapsular cataract; ERM epi-retinal membrane; PVD posterior vitreous detachment; RD retinal detachment; DM diabetes mellitus; DR diabetic retinopathy; NPDR non-proliferative diabetic retinopathy; PDR proliferative diabetic retinopathy; CSME clinically significant macular edema; DME diabetic macular edema; dbh dot blot hemorrhages; CWS cotton wool spot; POAG primary open angle glaucoma; C/D cup-to-disc ratio; HVF humphrey visual  field; GVF goldmann visual field; OCT optical coherence tomography; IOP intraocular pressure; BRVO Branch retinal vein occlusion; CRVO central retinal vein occlusion; CRAO central retinal artery occlusion; BRAO branch retinal artery occlusion; RT retinal tear; SB scleral buckle; PPV pars plana vitrectomy; VH Vitreous hemorrhage; PRP panretinal laser photocoagulation; IVK intravitreal kenalog; VMT vitreomacular traction; MH Macular hole;  NVD neovascularization of the disc; NVE neovascularization elsewhere; AREDS age related eye disease study; ARMD age related macular degeneration; POAG primary open angle glaucoma; EBMD epithelial/anterior basement membrane dystrophy; ACIOL anterior chamber intraocular lens; IOL intraocular lens; PCIOL posterior chamber intraocular lens; Phaco/IOL phacoemulsification with intraocular lens placement; [place] photorefractive keratectomy; LASIK laser assisted in situ keratomileusis; HTN hypertension; DM diabetes mellitus; COPD chronic obstructive pulmonary disease

## 2020-12-01 NOTE — Assessment & Plan Note (Signed)
No signs of CNVM OS

## 2020-12-01 NOTE — Assessment & Plan Note (Signed)
Minor no impact on acuity with epiretinal membrane

## 2020-12-01 NOTE — Assessment & Plan Note (Signed)
New intraretinal fluid superior to the fovea, new onset since February and since last treatment October 2021.  Will need retreatment today to prevent recurrence of CNVM and enlargement of scotoma OD.

## 2021-01-19 ENCOUNTER — Ambulatory Visit (INDEPENDENT_AMBULATORY_CARE_PROVIDER_SITE_OTHER): Payer: Medicare Other | Admitting: Ophthalmology

## 2021-01-19 ENCOUNTER — Other Ambulatory Visit: Payer: Self-pay

## 2021-01-19 ENCOUNTER — Encounter (INDEPENDENT_AMBULATORY_CARE_PROVIDER_SITE_OTHER): Payer: Self-pay | Admitting: Ophthalmology

## 2021-01-19 DIAGNOSIS — H353211 Exudative age-related macular degeneration, right eye, with active choroidal neovascularization: Secondary | ICD-10-CM

## 2021-01-19 DIAGNOSIS — I259 Chronic ischemic heart disease, unspecified: Secondary | ICD-10-CM

## 2021-01-19 MED ORDER — BEVACIZUMAB 2.5 MG/0.1ML IZ SOSY
2.5000 mg | PREFILLED_SYRINGE | INTRAVITREAL | Status: AC | PRN
  Administered 2021-01-19: 15:00:00 2.5 mg via INTRAVITREAL

## 2021-01-19 NOTE — Assessment & Plan Note (Signed)
Recent activity OD, now improved post Avastin usage. Currently at 7-week follow-up.  We will maintain same interval next examination 7 weeks

## 2021-01-19 NOTE — Progress Notes (Signed)
01/19/2021     CHIEF COMPLAINT Patient presents for Retina Follow Up (7 week fu OD and Avastin OD/Pt states VA OU stable since last visit. Pt denies FOL, floaters, or ocular pain OU. /)   HISTORY OF PRESENT ILLNESS: [name] [name] is a 87 y.o. male who presents to the clinic today for:   HPI     Retina Follow Up           Diagnosis: Wet AMD   Laterality: right eye   Onset: 7 weeks ago   Severity: mild   Duration: 7 weeks   Course: stable   Comments: 7 week fu OD and Avastin OD Pt states VA OU stable since last visit. Pt denies FOL, floaters, or ocular pain OU.         Last edited by Kendra Opitz, COA on 01/19/2021  2:14 PM.      Referring physician: No referring provider defined for this encounter.  HISTORICAL INFORMATION:   Selected notes from the MEDICAL RECORD NUMBER    Lab Results  Component Value Date   HGBA1C 6.3 (H) 03/19/2014     CURRENT MEDICATIONS: No current outpatient medications on file. (Ophthalmic Drugs)   No current facility-administered medications for this visit. (Ophthalmic Drugs)   Current Outpatient Medications (Other)  Medication Sig   acetaminophen (TYLENOL) 500 MG tablet Take 1,000 mg by mouth every 4 (four) hours as needed.   aspirin 81 MG tablet Take 81 mg by mouth daily.   B Complex-C (SUPER B COMPLEX) TABS Take 1 tablet by mouth daily.   clindamycin (CLEOCIN) 150 MG capsule TK 4 CS PO PRIOR TO DENTAL APPOINTMENT   finasteride (PROSCAR) 5 MG tablet Take 1 tablet (5 mg total) by mouth daily.   ipratropium (ATROVENT) 0.03 % nasal spray Place 2 sprays into both nostrils as needed.   levothyroxine (SYNTHROID) 50 MCG tablet Take 1 tablet (50 mcg total) by mouth daily.   metoprolol tartrate (LOPRESSOR) 25 MG tablet Take 1 tablet (25 mg total) by mouth 2 (two) times daily.   Multiple Vitamins-Minerals (PRESERVISION AREDS 2 PO) Take 1 capsule by mouth 2 (two) times daily.    PARoxetine (PAXIL) 20 MG tablet Take 0.5 tablets (10 mg  total) by mouth every morning.   potassium chloride (MICRO-K) 10 MEQ CR capsule Take 1 capsule (10 mEq total) by mouth daily.   torsemide (DEMADEX) 20 MG tablet Take 1 tablet (20 mg total) by mouth daily.   No current facility-administered medications for this visit. (Other)      REVIEW OF SYSTEMS:    ALLERGIES Allergies  Allergen Reactions   Lasix [Furosemide] Swelling    Tongue swelling   Mucinex [Guaifenesin Er]     Tongue swelling   Amoxicillin-Pot Clavulanate Itching   Morphine And Related Nausea And Vomiting   Penicillins Hives    PAST MEDICAL HISTORY Past Medical History:  Diagnosis Date   AAA (abdominal aortic aneurysm) ([place])    s/p endovascular repair 2004   Aortic insufficiency    Prosthetic valve dysfunction   Aortic stenosis    s/p AVR in 2003, bovine   Arthritis    BCC (basal cell carcinoma) 09/17/2012   BPH (benign prostatic hyperplasia)    CAD (coronary artery disease)    stents in LXC and has a totally occluded RCA   Chicken pox    CVA (cerebral infarction) 12/09   Depression    Diverticulosis    Glaucoma    recently told by  Dr Janyth Contes he did not have it   HTN (hypertension)    Hyperlipemia    Leaky heart valve    Measles    Mumps    Myocardial infarction [facility])    Personal history of poliomyelitis 09/17/2012   In childhood   Pneumonia    hx of   Polio    Prosthetic valve dysfunction    Aortic stenosis and insufficiency    Retinal hemorrhage of right eye 11/02/2019   Right iliac artery stenosis (HCC)    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   23 mm Edwards Sapien transcatheter heart valve placed via open left transfemoral approach   Shortness of breath    exertion, orthopnea   Sleeping difficulties    Stroke [facility])    no residual   Thyroid disease    Past Surgical History:  Procedure Laterality Date   ABDOMINAL AORTIC ANEURYSM REPAIR  2004   s/p endovascular repiar of AAA in 2004   AORTIC VALVE REPLACEMENT  2003    #75mm pericardial valve    APPENDECTOMY     CARDIAC CATHETERIZATION     CORONARY ANGIOPLASTY     CORONARY ARTERY BYPASS GRAFT     CORONARY STENT PLACEMENT     INTRAOPERATIVE TRANSESOPHAGEAL ECHOCARDIOGRAM N/A 03/23/2014   Procedure: INTRAOPERATIVE TRANSESOPHAGEAL ECHOCARDIOGRAM;  Surgeon: Sherren Mocha, MD;  Location: [place];  Service: Open Heart Surgery;  Laterality: N/A;   JOINT REPLACEMENT     LEFT AND RIGHT HEART CATHETERIZATION WITH CORONARY ANGIOGRAM N/A 02/08/2014   Procedure: LEFT AND RIGHT HEART CATHETERIZATION WITH CORONARY ANGIOGRAM;  Surgeon: Blane Ohara, MD;  Location: [facility] CATH LAB;  Service: Cardiovascular;  Laterality: N/A;   TEE WITHOUT CARDIOVERSION N/A 09/02/2013   Procedure: TRANSESOPHAGEAL ECHOCARDIOGRAM (TEE);  Surgeon: Dorothy Spark, MD;  Location: [place];  Service: Cardiovascular;  Laterality: N/A;   TONSILLECTOMY     TRANSCATHETER AORTIC VALVE REPLACEMENT, TRANSFEMORAL N/A 03/23/2014   Procedure: TRANSCATHETER AORTIC VALVE REPLACEMENT, TRANSFEMORAL;  Surgeon: Sherren Mocha, MD;  Location: [place];  Service: Open Heart Surgery;  Laterality: N/A;  valve in valve TAVR   WISDOM TOOTH EXTRACTION      FAMILY HISTORY Family History  Problem Relation Age of Onset   Colon cancer Mother    Cancer Mother    Aneurysm Father    Heart attack Brother        CHF   Prostate cancer Brother    Cancer Brother    Heart disease Brother    Cancer Daughter     SOCIAL HISTORY Social History   Tobacco Use   Smoking status: Former    Packs/day: 0.50    Years: 30.00    Pack years: 15.00    Types: Cigarettes    Quit date: 10/04/1980    Years since quitting: 40.3   Smokeless tobacco: Never  Vaping Use   Vaping Use: Never used  Substance Use Topics   Alcohol use: Yes    Alcohol/week: 7.0 standard drinks    Types: 7 Shots of liquor per week    Comment: cocktails/night   Drug use: No         OPHTHALMIC EXAM:  Base Eye Exam     Visual Acuity (ETDRS)        Right Left   Dist cc 20/40 20/50   Dist ph cc NI NI         Tonometry (Tonopen, 2:20 PM)       Right Left   Pressure 09  10         Pupils       Pupils Dark Light Shape React APD   Right PERRL 4 3 Round Brisk None   Left PERRL 4 3 Round Brisk None         Visual Fields       Left Right    Full    Restrictions  Partial outer superior temporal, inferior temporal, superior nasal deficiencies         Extraocular Movement       Right Left    Full Full         Neuro/Psych     Oriented x3: Yes   Mood/Affect: Normal         Dilation     Right eye: 1.0% Mydriacyl, 2.5% Phenylephrine @ 2:20 PM           Slit Lamp and Fundus Exam     External Exam       Right Left   External Normal Normal         Slit Lamp Exam       Right Left   Lids/Lashes Normal Normal   Conjunctiva/Sclera White and quiet White and quiet   Cornea Clear Clear   Anterior Chamber Deep and quiet Deep and quiet   Iris Round and reactive Round and reactive   Lens Posterior chamber intraocular lens Posterior chamber intraocular lens   Anterior Vitreous Normal Normal         Fundus Exam       Right Left   Posterior Vitreous Clear Clear   Disc Pericapillary atrophy. Normal   C/D Ratio 0.6 0.55   Macula Age related macular degeneration, Atrophy,  Advanced age related macular degeneration, [place], minor epiretinal membrane nasal to the fovea. Age related macular degeneration, Atrophy,  Advanced age related macular degeneration, [place]   Vessels Normal Normal   Periphery Normal Normal            IMAGING AND PROCEDURES  Imaging and Procedures for 01/19/21  OCT, Retina - OU - Both Eyes       Right Eye Quality was good. Scan locations included subfoveal. Central Foveal Thickness: 289. Progression has improved. Findings include epiretinal membrane, central retinal atrophy, outer retinal atrophy, subretinal scarring, intraretinal fluid.   Left Eye Quality was good.  Scan locations included subfoveal. Central Foveal Thickness: 272. Progression has been stable. Findings include central retinal atrophy, outer retinal atrophy.   Notes Much less intraretinal fluid temporal and superior to the fovea OD, will need to recommence and start intravitreal Avastin and to control this disease     Intravitreal Injection, Pharmacologic Agent - OD - Right Eye       Time Out 01/19/2021. 3:01 PM. Confirmed correct patient, procedure, site, and patient consented.   Anesthesia Topical anesthesia was used. Anesthetic medications included Akten 3.5%.   Procedure Preparation included Ofloxacin , 10% betadine to eyelids, 5% betadine to ocular surface. A 30 gauge needle was used.   Injection: 2.5 mg bevacizumab 2.5 MG/0.1ML   Route: Intravitreal, Site: Right Eye   NDC: [phone], Lot: [id number]   Post-op Post injection exam found visual acuity of at least counting fingers. The patient tolerated the procedure well. There were no complications. The patient received written and verbal post procedure care education. Post injection medications were not given.              ASSESSMENT/PLAN:  Exudative age-related macular degeneration of right eye with active choroidal  neovascularization ([place]) Recent activity OD, now improved post Avastin usage. Currently at 7-week follow-up.  We will maintain same interval next examination 7 weeks     ICD-10-CM   1. Exudative age-related macular degeneration of right eye with active choroidal neovascularization (HCC)  H35.3211 OCT, Retina - OU - Both Eyes    Intravitreal Injection, Pharmacologic Agent - OD - Right Eye    bevacizumab (AVASTIN) SOSY 2.5 mg      1.  OD, recent onset CNVM superior the fovea.  Slightly active today still at 7 weeks.  We will repeat injection today and maintain 7-week follow-up.  No progression to center involvement acuity stabilized  2.  AMD OS no sign of CNVM observe  3.  Ophthalmic Meds Ordered  this visit:  Meds ordered this encounter  Medications   bevacizumab (AVASTIN) SOSY 2.5 mg       Return in about 7 weeks (around 03/09/2021) for dilate, OD, AVASTIN OCT.  There are no Patient Instructions on file for this visit.   Explained the diagnoses, plan, and follow up with the patient and they expressed understanding.  Patient expressed understanding of the importance of proper follow up care.   Clent Demark Jacquel Redditt M.D. Diseases & Surgery of the Retina and Vitreous [facility]ina [facility] Diabetic [place] 01/19/21     Abbreviations: M myopia (nearsighted); A astigmatism; H hyperopia (farsighted); P presbyopia; Mrx spectacle prescription;  CTL contact lenses; OD right eye; OS left eye; OU both eyes  XT exotropia; ET esotropia; PEK punctate epithelial keratitis; PEE punctate epithelial erosions; DES dry eye syndrome; MGD meibomian gland dysfunction; ATs artificial tears; PFAT's preservative free artificial tears; [place] nuclear sclerotic cataract; PSC posterior subcapsular cataract; ERM epi-retinal membrane; PVD posterior vitreous detachment; RD retinal detachment; DM diabetes mellitus; DR diabetic retinopathy; NPDR non-proliferative diabetic retinopathy; PDR proliferative diabetic retinopathy; CSME clinically significant macular edema; DME diabetic macular edema; dbh dot blot hemorrhages; CWS cotton wool spot; POAG primary open angle glaucoma; C/D cup-to-disc ratio; HVF humphrey visual field; GVF goldmann visual field; OCT optical coherence tomography; IOP intraocular pressure; BRVO Branch retinal vein occlusion; CRVO central retinal vein occlusion; CRAO central retinal artery occlusion; BRAO branch retinal artery occlusion; RT retinal tear; SB scleral buckle; PPV pars plana vitrectomy; VH Vitreous hemorrhage; PRP panretinal laser photocoagulation; IVK intravitreal kenalog; VMT vitreomacular traction; MH Macular hole;  NVD neovascularization of the disc; NVE neovascularization elsewhere; AREDS age related  eye disease study; ARMD age related macular degeneration; POAG primary open angle glaucoma; EBMD epithelial/anterior basement membrane dystrophy; ACIOL anterior chamber intraocular lens; IOL intraocular lens; PCIOL posterior chamber intraocular lens; Phaco/IOL phacoemulsification with intraocular lens placement; [place] photorefractive keratectomy; LASIK laser assisted in situ keratomileusis; HTN hypertension; DM diabetes mellitus; COPD chronic obstructive pulmonary disease

## 2021-02-07 ENCOUNTER — Encounter: Payer: Self-pay | Admitting: Physician Assistant

## 2021-02-07 ENCOUNTER — Other Ambulatory Visit: Payer: Self-pay

## 2021-02-07 ENCOUNTER — Ambulatory Visit (INDEPENDENT_AMBULATORY_CARE_PROVIDER_SITE_OTHER): Payer: Medicare Other | Admitting: Physician Assistant

## 2021-02-07 VITALS — BP 130/70 | HR 65 | Ht 66.0 in | Wt 197.4 lb

## 2021-02-07 DIAGNOSIS — Z952 Presence of prosthetic heart valve: Secondary | ICD-10-CM

## 2021-02-07 DIAGNOSIS — T82857D Stenosis of cardiac prosthetic devices, implants and grafts, subsequent encounter: Secondary | ICD-10-CM

## 2021-02-07 DIAGNOSIS — I259 Chronic ischemic heart disease, unspecified: Secondary | ICD-10-CM

## 2021-02-07 DIAGNOSIS — I350 Nonrheumatic aortic (valve) stenosis: Secondary | ICD-10-CM

## 2021-02-07 DIAGNOSIS — I2510 Atherosclerotic heart disease of native coronary artery without angina pectoris: Secondary | ICD-10-CM

## 2021-02-07 DIAGNOSIS — I5033 Acute on chronic diastolic (congestive) heart failure: Secondary | ICD-10-CM

## 2021-02-07 DIAGNOSIS — I10 Essential (primary) hypertension: Secondary | ICD-10-CM

## 2021-02-07 NOTE — Progress Notes (Signed)
Cardiology Office Note:    Date:  02/07/2021   ID:  [name] [name], DOB 1922-10-10, MRN [id number]  PCP:  Patient, No Pcp Per (Inactive)   [facility] Providers Cardiologist:  Sherren Mocha, MD Cardiology APP:  [facility]     Referring MD: No ref. provider found   Chief Complaint:  Follow-up (CHF, AS, CAD)    Patient Profile:    [name] [name] is a 87 y.o. male with:  Coronary artery disease S/p remote inferior MI Hx of stent to LCx; know CTO of RCA Aortic valve stenosis S/p AVR in 2003 Bioprosthetic AVR insufficiency s/p valve in valve TAVR in 9/15 According to notes - concern for progressive AS >> conservative mgmt (HFpEF) heart failure with preserved ejection fraction Abdominal aortic aneurysm s/p repair in 2004 Peripheral arterial disease Hx of CVA Hypertension Hyperlipidemia BPH Hypothyroidism    Prior CV studies: Echocardiogram 11/07/16 Moderate LVH, EF 50-55, no RWMA, GR 1 DD, s/p TAVR with severe AS (mean gradient 41 mmHg), mild MR, normal RVSF, trivial TR, PASP 15   Cardiac catheterization 02/08/14 LM patent LAD patent LCx mid 40, mid stent ok RCA mid 95 (small vessel)   History of Present Illness: Mr. Waltner was last seen in 10/2020.  He had worsening symptoms of heart failure and I adjusted his torsemide.  He has had prior evidence of prosthetic aortic valve stenosis.  He has been deemed a poor surgical candidate and has been managed conservatively.  At last visit, we decided to hold off on repeat echocardiography as this would not likely change his management.  He returns for follow-up.  He is here with his son.  He has not had chest pain, syncope.  He has chronic orthopnea.  He sleeps in a recliner most nights.  He has continued lower extremity swelling.  His weights have been stable.        Past Medical History:  Diagnosis Date   AAA (abdominal aortic aneurysm) ([place])    s/p endovascular repair 2004   Aortic insufficiency    Prosthetic  valve dysfunction   Aortic stenosis    s/p AVR in 2003, bovine   Arthritis    BCC (basal cell carcinoma) 09/17/2012   BPH (benign prostatic hyperplasia)    CAD (coronary artery disease)    stents in LXC and has a totally occluded RCA   Chicken pox    CVA (cerebral infarction) 12/09   Depression    Diverticulosis    Glaucoma    recently told by Dr Janyth Contes he did not have it   HTN (hypertension)    Hyperlipemia    Leaky heart valve    Measles    Mumps    Myocardial infarction [facility])    Personal history of poliomyelitis 09/17/2012   In childhood   Pneumonia    hx of   Polio    Prosthetic valve dysfunction    Aortic stenosis and insufficiency    Retinal hemorrhage of right eye 11/02/2019   Right iliac artery stenosis (HCC)    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   aspirin 81 MG tablet Take 81 mg by mouth daily.   B Complex-C (SUPER B COMPLEX) TABS Take 1 tablet by mouth daily.   clindamycin (CLEOCIN) 150 MG capsule TK 4 CS PO PRIOR TO DENTAL APPOINTMENT   finasteride (PROSCAR) 5 MG tablet Take 1 tablet (5 mg total) by mouth daily.   ipratropium (ATROVENT) 0.03 % nasal spray Place 2 sprays into both nostrils as needed.   levothyroxine (SYNTHROID) 50 MCG tablet Take 1 tablet (50 mcg total) by mouth daily.   metoprolol tartrate (LOPRESSOR) 25 MG tablet Take 1 tablet (25 mg total) by mouth 2 (two) times daily.   Multiple Vitamins-Minerals (PRESERVISION AREDS 2 PO) Take 1 capsule by mouth 2 (two) times daily.    PARoxetine (PAXIL) 20 MG tablet Take 0.5 tablets (10 mg total) by mouth every morning.    potassium chloride (MICRO-K) 10 MEQ CR capsule Take 1 capsule (10 mEq total) by mouth daily.   torsemide (DEMADEX) 20 MG tablet Take 1 tablet (20 mg total) by mouth daily.     Allergies:   Lasix [furosemide], Mucinex [guaifenesin er], Amoxicillin-pot clavulanate, Morphine and related, and Penicillins   Social History   Tobacco Use   Smoking status: Former    Packs/day: 0.50    Years: 30.00    Pack years: 15.00    Types: Cigarettes    Quit date: 10/04/1980    Years since quitting: 40.3   Smokeless tobacco: Never  Vaping Use   Vaping Use: Never used  Substance Use Topics   Alcohol use: Yes    Alcohol/week: 7.0 standard drinks    Types: 7 Shots of liquor per week    Comment: cocktails/night   Drug use: No     Family Hx: The patient's family history includes Aneurysm in his father; Cancer in his brother, daughter, and mother; Colon cancer in his mother; Heart attack in his brother; Heart disease in his brother; Prostate cancer in his brother.  Review of Systems  Constitutional: Negative for fever.  Respiratory:  Positive for cough (chronic).     EKGs/Labs/Other Test Reviewed:    EKG:  EKG is  ordered today.  The ekg ordered today demonstrates normal sinus rhythm, heart rate 65, first-degree AV block, PR interval 262, normal axis, lateral T wave inversions, QTC 440, no change from prior tracing  Recent Labs: 03/01/2020: ALT 14; Hemoglobin 15.7; Platelets 165; TSH 4.030 11/08/2020: BUN 30; Creatinine, Ser 1.80; Potassium 4.4; Sodium 143   Recent Lipid Panel Lab Results  Component Value Date/Time   CHOL 177 01/14/2019 11:46 AM   TRIG 145 01/14/2019 11:46 AM   HDL 39 (L) 01/14/2019 11:46 AM   LDLCALC 109 (H) 01/14/2019 11:46 AM      Risk Assessment/Calculations:      Physical Exam:    VS:  BP 130/70   Pulse 65   Ht 5\' 6"  (1.676 m)   Wt 197 lb 6.4 oz (89.5 kg)   SpO2 94%   BMI 31.86 kg/m     Wt Readings from Last 3 Encounters:  02/07/21 197 lb 6.4 oz (89.5 kg)   11/01/20 198 lb 6.4 oz (90 kg)  09/14/20 196 lb (88.9 kg)     Constitutional:      Appearance: Healthy appearance. Not in distress.  Neck:     Vascular: JVD normal.  Pulmonary:     Effort: Pulmonary effort is normal.     Breath sounds: No wheezing. Bibasilar Rales present.  Cardiovascular:  Normal rate. Regular rhythm. Normal S1. Normal S2.      Murmurs: There is a grade 2/6 systolic murmur at the URSB.  Edema:    Peripheral edema present.    Pretibial: bilateral 1+ edema of the pretibial area.    Ankle: bilateral 2+ edema of the ankle. Abdominal:     Palpations: Abdomen is soft.  Skin:    General: Skin is warm and dry.  Neurological:     General: No focal deficit present.     Mental Status: Alert and oriented to person, place and time.     Cranial Nerves: Cranial nerves are intact.        ASSESSMENT & PLAN:    1. Acute on chronic heart failure with preserved ejection fraction ([place]) He continues to have evidence of volume excess on exam.  He has orthopnea as well as significant shortness of breath with exertion.  He has rales on exam.  He admits to a diet high in salt.  He is not taking the higher dose of torsemide as prescribed at last visit.  I suspect his worsening aortic stenosis is contributing to his heart failure.  He is not a surgical candidate.  We discussed continued medical therapy.  I have asked him to reduce salt in his diet.  Increase torsemide to 20 mg twice daily for 2 days then 20 mg daily.  Obtain BMET today and again in 2 weeks.  Follow-up with me in 3 months or sooner if symptoms worsen.  2. Nonrheumatic aortic valve stenosis 3. S/P TAVR (transcatheter aortic valve replacement) 4. Stenosis of prosthetic aortic valve, subsequent encounter His original AVR was in 2003.  He had bioprosthetic AVR insufficiency and underwent valve in valve TAVR in 2015.  He has had progressive aortic stenosis which has been managed conservatively.  As he is not a surgical  candidate, there is no reason to repeat his echocardiogram.  We discussed this again today.  As noted, he has continued volume overload.  I suspect this is related to his aortic stenosis.  If his volume management worsens, we will likely need to discuss palliative care/hospice.    5. Coronary artery disease involving native coronary artery of native heart without angina pectoris History of remote inferior MI.  His LCx stent was patent by last cardiac catheterization in 2015.  He is not having anginal symptoms.  Continue aspirin.  6. Essential hypertension The patient's blood pressure is controlled on his current regimen.  Continue current therapy.    Dispo:  Return in about 3 months (around 05/10/2021) for Routine Follow Up, w/ Richardson Dopp, PA-C.   Medication Adjustments/Labs and Tests Ordered: Current medicines are reviewed at length with the patient today.  Concerns regarding medicines are outlined above.  Tests Ordered: Orders Placed This Encounter  Procedures   Basic metabolic panel   Basic Metabolic Panel (BMET)   EKG 12-Lead    Medication Changes: No orders of the defined types were placed in this encounter.   Signed, Richardson Dopp, PA-C  02/07/2021 2:28 PM    [facility] [address], [place], [place]  [address] Phone: [phone]; Fax: [phone]

## 2021-02-07 NOTE — Patient Instructions (Signed)
Medication Instructions:   INCREASE torsemide one tablet by mouth ( 20 mg) twice daily for 2 days than one tablet by mouth ( 20 mg ) daily.   *If you need a refill on your cardiac medications before your next appointment, please call your pharmacy*   Lab Work:  TODAY!!!!!  BMET  Your physician recommends that you return for lab work on Monday, August 1 between 7:30 - 4:30.   If you have labs (blood work) drawn today and your tests are completely normal, you will receive your results only by: [place] (if you have MyChart) OR A paper copy in the mail If you have any lab test that is abnormal or we need to change your treatment, we will call you to review the results.   Testing/Procedures:  -NONE   Follow-Up: At [facility], you and your health needs are our priority.  As part of our continuing mission to provide you with exceptional heart care, we have created designated Provider Care Teams.  These Care Teams include your primary Cardiologist (physician) and Advanced Practice Providers (APPs -  Physician Assistants and Nurse Practitioners) who all work together to provide you with the care you need, when you need it.  We recommend signing up for the patient portal called "MyChart".  Sign up information is provided on this After Visit Summary.  MyChart is used to connect with patients for Virtual Visits (Telemedicine).  Patients are able to view lab/test results, encounter notes, upcoming appointments, etc.  Non-urgent messages can be sent to your provider as well.   To learn more about what you can do with MyChart, go to [email or web address].    Your next appointment:   3 month(s) with Richardson Dopp, PA-C on Wednesday, October 26 @ 2:15 pm.   The format for your next appointment:   In Person  Provider:   Richardson Dopp, PA-C   Other Instructions Low-Sodium Eating Plan Sodium, which is an element that makes up salt, helps you maintain a healthy balance of fluids in  your body. Too much sodium can increase your bloodpressure and cause fluid and waste to be held in your body. Your health care provider or dietitian may recommend following this plan if you have high blood pressure (hypertension), kidney disease, liver disease, or heart failure. Eating less sodium can help lower your blood pressure, reduce swelling, and protect your heart, liver, andkidneys. What are tips for following this plan? Reading food labels The Nutrition Facts label lists the amount of sodium in one serving of the food. If you eat more than one serving, you must multiply the listed amount of sodium by the number of servings. Choose foods with less than 140 mg of sodium per serving. Avoid foods with 300 mg of sodium or more per serving. Shopping  Look for lower-sodium products, often labeled as "low-sodium" or "no salt added." Always check the sodium content, even if foods are labeled as "unsalted" or "no salt added." Buy fresh foods. Avoid canned foods and pre-made or frozen meals. Avoid canned, cured, or processed meats. Buy breads that have less than 80 mg of sodium per slice.  Cooking  Eat more home-cooked food and less restaurant, buffet, and fast food. Avoid adding salt when cooking. Use salt-free seasonings or herbs instead of table salt or sea salt. Check with your health care provider or pharmacist before using salt substitutes. Cook with plant-based oils, such as canola, sunflower, or olive oil.  Meal planning When eating at a restaurant, ask  that your food be prepared with less salt or no salt, if possible. Avoid dishes labeled as brined, pickled, cured, smoked, or made with soy sauce, miso, or teriyaki sauce. Avoid foods that contain MSG (monosodium glutamate). MSG is sometimes added to [place] food, bouillon, and some canned foods. Make meals that can be grilled, baked, poached, roasted, or steamed. These are generally made with less sodium. General information Most  people on this plan should limit their sodium intake to 1,500-2,000 mg (milligrams) of sodium each day. What foods should I eat? Fruits Fresh, frozen, or canned fruit. Fruit juice. Vegetables Fresh or frozen vegetables. "No salt added" canned vegetables. "No salt added"tomato sauce and paste. Low-sodium or reduced-sodium tomato and vegetable juice. Grains Low-sodium cereals, including oats, puffed wheat and rice, and shredded wheat. Low-sodium crackers. Unsalted rice. Unsalted pasta. Low-sodium bread.Whole-grain breads and whole-grain pasta. Meats and other proteins Fresh or frozen (no salt added) meat, poultry, seafood, and fish. Low-sodium canned tuna and salmon. Unsalted nuts. Dried peas, beans, and lentils withoutadded salt. Unsalted canned beans. Eggs. Unsalted nut butters. Dairy Milk. Soy milk. Cheese that is naturally low in sodium, such as ricotta cheese, fresh mozzarella, or Swiss cheese. Low-sodium or reduced-sodium cheese. Creamcheese. Yogurt. Seasonings and condiments Fresh and dried herbs and spices. Salt-free seasonings. Low-sodium mustard and ketchup. Sodium-free salad dressing. Sodium-free light mayonnaise. Fresh orrefrigerated horseradish. Lemon juice. Vinegar. Other foods Homemade, reduced-sodium, or low-sodium soups. Unsalted popcorn and pretzels.Low-salt or salt-free chips. The items listed above may not be a complete list of foods and beverages you can eat. Contact a dietitian for more information. What foods should I avoid? Vegetables Sauerkraut, pickled vegetables, and relishes. Olives. [place] fries. Onion rings. Regular canned vegetables (not low-sodium or reduced-sodium). Regular canned tomato sauce and paste (not low-sodium or reduced-sodium). Regular tomato and vegetable juice (not low-sodium or reduced-sodium). Frozenvegetables in sauces. Grains Instant hot cereals. Bread stuffing, pancake, and biscuit mixes. Croutons. Seasoned rice or pasta mixes. Noodle soup cups.  Boxed or frozen macaroni andcheese. Regular salted crackers. Self-rising flour. Meats and other proteins Meat or fish that is salted, canned, smoked, spiced, or pickled. Precooked or cured meat, such as sausages or meat loaves. Berniece Salines. Ham. Pepperoni. Hot dogs. Corned beef. Chipped beef. Salt pork. Jerky. Pickled herring. Anchovies andsardines. Regular canned tuna. Salted nuts. Dairy Processed cheese and cheese spreads. Hard cheeses. Cheese curds. Blue cheese.Feta cheese. String cheese. Regular cottage cheese. Buttermilk. Canned milk. Fats and oils Salted butter. Regular margarine. Ghee. Bacon fat. Seasonings and condiments Onion salt, garlic salt, seasoned salt, table salt, and sea salt. Canned and packaged gravies. Worcestershire sauce. Tartar sauce. Barbecue sauce. Teriyaki sauce. Soy sauce, including reduced-sodium. Steak sauce. Fish sauce. Oyster sauce. Cocktail sauce. Horseradish that you find on the shelf. Regular ketchup and mustard. Meat flavorings and tenderizers. Bouillon cubes. Hot sauce. Pre-made or packaged marinades. Pre-made or packaged taco seasonings. Relishes.Regular salad dressings. Salsa. Other foods Salted popcorn and pretzels. Corn chips and puffs. Potato and tortilla chips.Canned or dried soups. Pizza. Frozen entrees and pot pies. The items listed above may not be a complete list of foods and beverages you should avoid. Contact a dietitian for more information. Summary Eating less sodium can help lower your blood pressure, reduce swelling, and protect your heart, liver, and kidneys. Most people on this plan should limit their sodium intake to 1,500-2,000 mg (milligrams) of sodium each day. Canned, boxed, and frozen foods are high in sodium. Restaurant foods, fast foods, and pizza are also very high in sodium.  You also get sodium by adding salt to food. Try to cook at home, eat more fresh fruits and vegetables, and eat less fast food and canned, processed, or prepared  foods. This information is not intended to replace advice given to you by your health care provider. Make sure you discuss any questions you have with your healthcare provider. Document Revised: 08/14/2019 Document Reviewed: 06/10/2019 Elsevier Patient Education  2022 [employer].

## 2021-02-08 LAB — BASIC METABOLIC PANEL
BUN/Creatinine Ratio: 15 (ref 10–24)
BUN: 24 mg/dL (ref 10–36)
CO2: 24 mmol/L (ref 20–29)
Calcium: 9.7 mg/dL (ref 8.6–10.2)
Chloride: 104 mmol/L (ref 96–106)
Creatinine, Ser: 1.65 mg/dL — ABNORMAL HIGH (ref 0.76–1.27)
Glucose: 121 mg/dL — ABNORMAL HIGH (ref 65–99)
Potassium: 4.9 mmol/L (ref 3.5–5.2)
Sodium: 144 mmol/L (ref 134–144)
eGFR: 38 mL/min/{1.73_m2} — ABNORMAL LOW (ref >59)

## 2021-02-20 ENCOUNTER — Other Ambulatory Visit: Payer: Self-pay

## 2021-02-20 ENCOUNTER — Other Ambulatory Visit: Payer: Medicare Other | Admitting: *Deleted

## 2021-02-20 DIAGNOSIS — I350 Nonrheumatic aortic (valve) stenosis: Secondary | ICD-10-CM

## 2021-02-20 DIAGNOSIS — I2510 Atherosclerotic heart disease of native coronary artery without angina pectoris: Secondary | ICD-10-CM

## 2021-02-20 DIAGNOSIS — T82857D Stenosis of cardiac prosthetic devices, implants and grafts, subsequent encounter: Secondary | ICD-10-CM

## 2021-02-20 DIAGNOSIS — I10 Essential (primary) hypertension: Secondary | ICD-10-CM

## 2021-02-20 DIAGNOSIS — Z952 Presence of prosthetic heart valve: Secondary | ICD-10-CM

## 2021-02-20 DIAGNOSIS — I5033 Acute on chronic diastolic (congestive) heart failure: Secondary | ICD-10-CM

## 2021-02-21 LAB — BASIC METABOLIC PANEL
BUN/Creatinine Ratio: 18 (ref 10–24)
BUN: 28 mg/dL (ref 10–36)
CO2: 24 mmol/L (ref 20–29)
Calcium: 9.1 mg/dL (ref 8.6–10.2)
Chloride: 106 mmol/L (ref 96–106)
Creatinine, Ser: 1.58 mg/dL — ABNORMAL HIGH (ref 0.76–1.27)
Glucose: 136 mg/dL — ABNORMAL HIGH (ref 65–99)
Potassium: 4.4 mmol/L (ref 3.5–5.2)
Sodium: 148 mmol/L — ABNORMAL HIGH (ref 134–144)
eGFR: 40 mL/min/{1.73_m2} — ABNORMAL LOW (ref >59)

## 2021-03-09 ENCOUNTER — Encounter (INDEPENDENT_AMBULATORY_CARE_PROVIDER_SITE_OTHER): Payer: Self-pay | Admitting: Ophthalmology

## 2021-03-09 ENCOUNTER — Other Ambulatory Visit: Payer: Self-pay

## 2021-03-09 ENCOUNTER — Ambulatory Visit (INDEPENDENT_AMBULATORY_CARE_PROVIDER_SITE_OTHER): Payer: Medicare Other | Admitting: Ophthalmology

## 2021-03-09 DIAGNOSIS — H353211 Exudative age-related macular degeneration, right eye, with active choroidal neovascularization: Secondary | ICD-10-CM

## 2021-03-09 DIAGNOSIS — H353133 Nonexudative age-related macular degeneration, bilateral, advanced atrophic without subfoveal involvement: Secondary | ICD-10-CM | POA: Diagnosis not present

## 2021-03-09 MED ORDER — BEVACIZUMAB 2.5 MG/0.1ML IZ SOSY
2.5000 mg | PREFILLED_SYRINGE | INTRAVITREAL | Status: AC | PRN
  Administered 2021-03-09: 2.5 mg via INTRAVITREAL

## 2021-03-09 NOTE — Assessment & Plan Note (Signed)
Recurrence of CNVM OD, control now on study 7-week interval we will repeat injection today and extend interval of therapy and examination next 8 weeks

## 2021-03-09 NOTE — Assessment & Plan Note (Signed)
And OS remains free of CNVM continue to monitor dilate next time

## 2021-03-09 NOTE — Progress Notes (Signed)
03/09/2021     CHIEF COMPLAINT Patient presents for Retina Follow Up   HISTORY OF PRESENT ILLNESS: [name] [name] is a 87 y.o. male who presents to the clinic today for:   HPI     Retina Follow Up           Diagnosis: Wet AMD   Laterality: right eye   Onset: 7 weeks ago   Severity: mild   Duration: 7 weeks   Course: stable         Comments   7 week fu OD and Avastin OD Pt states, "Sometimes late in the evening I do not see as well as I do during the day. Things get a little more blurred."       Last edited by Kendra Opitz, COA on 03/09/2021  1:47 PM.      Referring physician: No referring provider defined for this encounter.  HISTORICAL INFORMATION:   Selected notes from the MEDICAL RECORD NUMBER    Lab Results  Component Value Date   HGBA1C 6.3 (H) 03/19/2014     CURRENT MEDICATIONS: No current outpatient medications on file. (Ophthalmic Drugs)   No current facility-administered medications for this visit. (Ophthalmic Drugs)   Current Outpatient Medications (Other)  Medication Sig   acetaminophen (TYLENOL) 500 MG tablet Take 1,000 mg by mouth every 4 (four) hours as needed.   aspirin 81 MG tablet Take 81 mg by mouth daily.   B Complex-C (SUPER B COMPLEX) TABS Take 1 tablet by mouth daily.   clindamycin (CLEOCIN) 150 MG capsule TK 4 CS PO PRIOR TO DENTAL APPOINTMENT   finasteride (PROSCAR) 5 MG tablet Take 1 tablet (5 mg total) by mouth daily.   ipratropium (ATROVENT) 0.03 % nasal spray Place 2 sprays into both nostrils as needed.   levothyroxine (SYNTHROID) 50 MCG tablet Take 1 tablet (50 mcg total) by mouth daily.   metoprolol tartrate (LOPRESSOR) 25 MG tablet Take 1 tablet (25 mg total) by mouth 2 (two) times daily.   Multiple Vitamins-Minerals (PRESERVISION AREDS 2 PO) Take 1 capsule by mouth 2 (two) times daily.    PARoxetine (PAXIL) 20 MG tablet Take 0.5 tablets (10 mg total) by mouth every morning.   potassium chloride (MICRO-K) 10 MEQ CR  capsule Take 1 capsule (10 mEq total) by mouth daily.   torsemide (DEMADEX) 20 MG tablet Take 1 tablet (20 mg total) by mouth daily.   No current facility-administered medications for this visit. (Other)      REVIEW OF SYSTEMS:    ALLERGIES Allergies  Allergen Reactions   Lasix [Furosemide] Swelling    Tongue swelling   Mucinex [Guaifenesin Er]     Tongue swelling   Amoxicillin-Pot Clavulanate Itching   Morphine And Related Nausea And Vomiting   Penicillins Hives    PAST MEDICAL HISTORY Past Medical History:  Diagnosis Date   AAA (abdominal aortic aneurysm) ([place])    s/p endovascular repair 2004   Aortic insufficiency    Prosthetic valve dysfunction   Aortic stenosis    s/p AVR in 2003, bovine   Arthritis    BCC (basal cell carcinoma) 09/17/2012   BPH (benign prostatic hyperplasia)    CAD (coronary artery disease)    stents in LXC and has a totally occluded RCA   Chicken pox    CVA (cerebral infarction) 12/09   Depression    Diverticulosis    Glaucoma    recently told by Dr Janyth Contes he did not have it  HTN (hypertension)    Hyperlipemia    Leaky heart valve    Measles    Mumps    Myocardial infarction [facility])    Personal history of poliomyelitis 09/17/2012   In childhood   Pneumonia    hx of   Polio    Prosthetic valve dysfunction    Aortic stenosis and insufficiency    Retinal hemorrhage of right eye 11/02/2019   Right iliac artery stenosis (HCC)    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   23 mm Edwards Sapien transcatheter heart valve placed via open left transfemoral approach   Shortness of breath    exertion, orthopnea   Sleeping difficulties    Stroke [facility])    no residual   Thyroid disease    Past Surgical History:  Procedure Laterality Date   ABDOMINAL AORTIC ANEURYSM REPAIR  2004   s/p endovascular repiar of AAA in 2004   AORTIC VALVE REPLACEMENT  2003   #80m pericardial valve    APPENDECTOMY     CARDIAC CATHETERIZATION      CORONARY ANGIOPLASTY     CORONARY ARTERY BYPASS GRAFT     CORONARY STENT PLACEMENT     INTRAOPERATIVE TRANSESOPHAGEAL ECHOCARDIOGRAM N/A 03/23/2014   Procedure: INTRAOPERATIVE TRANSESOPHAGEAL ECHOCARDIOGRAM;  Surgeon: MSherren Mocha MD;  Location: [facility]  Service: Open Heart Surgery;  Laterality: N/A;   JOINT REPLACEMENT     LEFT AND RIGHT HEART CATHETERIZATION WITH CORONARY ANGIOGRAM N/A 02/08/2014   Procedure: LEFT AND RIGHT HEART CATHETERIZATION WITH CORONARY ANGIOGRAM;  Surgeon: MBlane Ohara MD;  [facility];  Service: Cardiovascular;  Laterality: N/A;   TEE WITHOUT CARDIOVERSION N/A 09/02/2013   Procedure: TRANSESOPHAGEAL ECHOCARDIOGRAM (TEE);  Surgeon: KDorothy Spark MD;  Location: [facility]  Service: Cardiovascular;  Laterality: N/A;   TONSILLECTOMY     TRANSCATHETER AORTIC VALVE REPLACEMENT, TRANSFEMORAL N/A 03/23/2014   Procedure: TRANSCATHETER AORTIC VALVE REPLACEMENT, TRANSFEMORAL;  Surgeon: MSherren Mocha MD;  Location: [facility]  Service: Open Heart Surgery;  Laterality: N/A;  valve in valve TAVR   WISDOM TOOTH EXTRACTION      FAMILY HISTORY Family History  Problem Relation Age of Onset   Colon cancer Mother    Cancer Mother    Aneurysm Father    Heart attack Brother        CHF   Prostate cancer Brother    Cancer Brother    Heart disease Brother    Cancer Daughter     SOCIAL HISTORY Social History   Tobacco Use   Smoking status: Former    Packs/day: 0.50    Years: 30.00    Pack years: 15.00    Types: Cigarettes    Quit date: 10/04/1980    Years since quitting: 40.4   Smokeless tobacco: Never  Vaping Use   Vaping Use: Never used  Substance Use Topics   Alcohol use: Yes    Alcohol/week: 7.0 standard drinks    Types: 7 Shots of liquor per week    Comment: cocktails/night   Drug use: No         OPHTHALMIC EXAM:  Base Eye Exam     Visual Acuity (ETDRS)       Right Left   Dist cc 20/40 -1 20/100 -2   Dist ph cc NI 20/50 -2          Tonometry (Tonopen, 1:52 PM)       Right Left   Pressure 12 11  Pupils       Pupils Dark Light Shape React APD   Right PERRL 4 3 Round Brisk None   Left PERRL 3 2 Round Brisk None         Visual Fields       Left Right    Full    Restrictions  Partial outer superior temporal, inferior temporal, superior nasal deficiencies         Extraocular Movement       Right Left    Full Full         Neuro/Psych     Oriented x3: Yes   Mood/Affect: Normal         Dilation     Right eye: 1.0% Mydriacyl, 2.5% Phenylephrine @ 1:42 PM           Slit Lamp and Fundus Exam     External Exam       Right Left   External Normal Normal         Slit Lamp Exam       Right Left   Lids/Lashes Normal Normal   Conjunctiva/Sclera White and quiet White and quiet   Cornea Clear Clear   Anterior Chamber Deep and quiet Deep and quiet   Iris Round and reactive Round and reactive   Lens Posterior chamber intraocular lens Posterior chamber intraocular lens   Anterior Vitreous Normal Normal         Fundus Exam       Right Left   Posterior Vitreous Clear    Disc Pericapillary atrophy. Normal   C/D Ratio 0.6    Macula Age related macular degeneration, Atrophy,  Advanced age related macular degeneration, [place], minor epiretinal membrane nasal to the fovea.    Vessels Normal    Periphery Normal             IMAGING AND PROCEDURES  Imaging and Procedures for 03/09/21  OCT, Retina - OU - Both Eyes       Right Eye Quality was good. Scan locations included subfoveal. Central Foveal Thickness: 278. Progression has improved. Findings include abnormal foveal contour.   Left Eye Quality was good. Scan locations included subfoveal. Central Foveal Thickness: 273. Progression has been stable. Findings include abnormal foveal contour.   Notes Much less intraretinal fluid temporal and superior to the fovea OD, improved condition macula OD after recent  intravitreal Avastin delivered some 7 weeks previous and prior to that 6 weeks previous we will repeat injection today     Intravitreal Injection, Pharmacologic Agent - OD - Right Eye       Time Out 03/09/2021. 2:30 PM. Confirmed correct patient, procedure, site, and patient consented.   Anesthesia Topical anesthesia was used. Anesthetic medications included Akten 3.5%.   Procedure Preparation included Ofloxacin , 10% betadine to eyelids, 5% betadine to ocular surface. A 30 gauge needle was used.   Injection: 2.5 mg bevacizumab 2.5 MG/0.1ML   Route: Intravitreal, Site: Right Eye   NDC: [phone], Lot: [id number]   Post-op Post injection exam found visual acuity of at least counting fingers. The patient tolerated the procedure well. There were no complications. The patient received written and verbal post procedure care education. Post injection medications were not given.              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 Exudative age-related macular degeneration of right eye with active choroidal neovascularization (HCC)  H35.3211 OCT, Retina - OU - Both Eyes    Intravitreal Injection, Pharmacologic Agent - OD - Right Eye    bevacizumab (AVASTIN) SOSY 2.5 mg    2. Advanced nonexudative age-related macular degeneration of both eyes without subfoveal involvement  H35.3133       1.  2.  3.  Ophthalmic Meds Ordered this visit:  Meds ordered this encounter  Medications   bevacizumab (AVASTIN) SOSY 2.5 mg       Return in about 8 weeks (around  05/04/2021) for DILATE OU, AVASTIN OCT, OD.  There are no Patient Instructions on file for this visit.   Explained the diagnoses, plan, and follow up with the patient and they expressed understanding.  Patient expressed understanding of the importance of proper follow up care.   Clent Demark Keerat Denicola M.D. Diseases & Surgery of the Retina and Vitreous [facility] 03/09/21     Abbreviations: M myopia (nearsighted); A astigmatism; H hyperopia (farsighted); P presbyopia; Mrx spectacle prescription;  CTL contact lenses; OD right eye; OS left eye; OU both eyes  XT exotropia; ET esotropia; PEK punctate epithelial keratitis; PEE punctate epithelial erosions; DES dry eye syndrome; MGD meibomian gland dysfunction; ATs artificial tears; PFAT's preservative free artificial tears; [place] nuclear sclerotic cataract; PSC posterior subcapsular cataract; ERM epi-retinal membrane; PVD posterior vitreous detachment; RD retinal detachment; DM diabetes mellitus; DR diabetic retinopathy; NPDR non-proliferative diabetic retinopathy; PDR proliferative diabetic retinopathy; CSME clinically significant macular edema; DME diabetic macular edema; dbh dot blot hemorrhages; CWS cotton wool spot; POAG primary open angle glaucoma; C/D cup-to-disc ratio; HVF humphrey visual field; GVF goldmann visual field; OCT optical coherence tomography; IOP intraocular pressure; BRVO Branch retinal vein occlusion; CRVO central retinal vein occlusion; CRAO central retinal artery occlusion; BRAO branch retinal artery occlusion; RT retinal tear; SB scleral buckle; PPV pars plana vitrectomy; VH Vitreous hemorrhage; PRP panretinal laser photocoagulation; IVK intravitreal kenalog; VMT vitreomacular traction; MH Macular hole;  NVD neovascularization of the disc; NVE neovascularization elsewhere; AREDS age related eye disease study; ARMD age related macular degeneration; POAG primary open angle glaucoma; EBMD epithelial/anterior basement membrane  dystrophy; ACIOL anterior chamber intraocular lens; IOL intraocular lens; PCIOL posterior chamber intraocular lens; Phaco/IOL phacoemulsification with intraocular lens placement; [place] photorefractive keratectomy; LASIK laser assisted in situ keratomileusis; HTN hypertension; DM diabetes mellitus; COPD chronic obstructive pulmonary disease

## 2021-05-04 ENCOUNTER — Encounter (INDEPENDENT_AMBULATORY_CARE_PROVIDER_SITE_OTHER): Payer: Medicare Other | Admitting: Ophthalmology

## 2021-05-04 ENCOUNTER — Ambulatory Visit (INDEPENDENT_AMBULATORY_CARE_PROVIDER_SITE_OTHER): Payer: Medicare Other | Admitting: Ophthalmology

## 2021-05-04 ENCOUNTER — Other Ambulatory Visit: Payer: Self-pay

## 2021-05-04 ENCOUNTER — Encounter (INDEPENDENT_AMBULATORY_CARE_PROVIDER_SITE_OTHER): Payer: Self-pay | Admitting: Ophthalmology

## 2021-05-04 DIAGNOSIS — H35371 Puckering of macula, right eye: Secondary | ICD-10-CM | POA: Diagnosis not present

## 2021-05-04 DIAGNOSIS — H353133 Nonexudative age-related macular degeneration, bilateral, advanced atrophic without subfoveal involvement: Secondary | ICD-10-CM | POA: Diagnosis not present

## 2021-05-04 DIAGNOSIS — I259 Chronic ischemic heart disease, unspecified: Secondary | ICD-10-CM | POA: Diagnosis not present

## 2021-05-04 DIAGNOSIS — H353211 Exudative age-related macular degeneration, right eye, with active choroidal neovascularization: Secondary | ICD-10-CM | POA: Diagnosis not present

## 2021-05-04 NOTE — Assessment & Plan Note (Signed)

## 2021-05-04 NOTE — Progress Notes (Signed)
05/04/2021     CHIEF COMPLAINT Patient presents for  Chief Complaint  Patient presents with   Retina Follow Up      HISTORY OF PRESENT ILLNESS: [name] [name] is a 87 y.o. male who presents to the clinic today for:   HPI     Retina Follow Up   Patient presents with  Wet AMD.  In both eyes.  This started 8 weeks ago.  Duration of 8 weeks.        Comments   8 week f/u OU with OCT and possible Avastin injection OD  Pt denies any visual changes since previous visit. PT does c/o eyes feeling tired in the evening time. Pt denies any new flashes or floaters. Pt denies any eye pain.   EyeMeds: AREDS 2      Last edited by Reather Littler, COA on 05/04/2021  1:15 PM.      Referring physician: No referring provider defined for this encounter.  HISTORICAL INFORMATION:   Selected notes from the MEDICAL RECORD NUMBER    Lab Results  Component Value Date   HGBA1C 6.3 (H) 03/19/2014     CURRENT MEDICATIONS: No current outpatient medications on file. (Ophthalmic Drugs)   No current facility-administered medications for this visit. (Ophthalmic Drugs)   Current Outpatient Medications (Other)  Medication Sig   acetaminophen (TYLENOL) 500 MG tablet Take 1,000 mg by mouth every 4 (four) hours as needed.   aspirin 81 MG tablet Take 81 mg by mouth daily.   B Complex-C (SUPER B COMPLEX) TABS Take 1 tablet by mouth daily.   clindamycin (CLEOCIN) 150 MG capsule TK 4 CS PO PRIOR TO DENTAL APPOINTMENT   finasteride (PROSCAR) 5 MG tablet Take 1 tablet (5 mg total) by mouth daily.   ipratropium (ATROVENT) 0.03 % nasal spray Place 2 sprays into both nostrils as needed.   levothyroxine (SYNTHROID) 50 MCG tablet Take 1 tablet (50 mcg total) by mouth daily.   metoprolol tartrate (LOPRESSOR) 25 MG tablet Take 1 tablet (25 mg total) by mouth 2 (two) times daily.   Multiple Vitamins-Minerals (PRESERVISION AREDS 2 PO) Take 1 capsule by mouth 2 (two) times daily.    PARoxetine (PAXIL) 20  MG tablet Take 0.5 tablets (10 mg total) by mouth every morning.   potassium chloride (MICRO-K) 10 MEQ CR capsule Take 1 capsule (10 mEq total) by mouth daily.   torsemide (DEMADEX) 20 MG tablet Take 1 tablet (20 mg total) by mouth daily.   No current facility-administered medications for this visit. (Other)      REVIEW OF SYSTEMS:    ALLERGIES Allergies  Allergen Reactions   Lasix [Furosemide] Swelling    Tongue swelling   Mucinex [Guaifenesin Er]     Tongue swelling   Amoxicillin-Pot Clavulanate Itching   Morphine And Related Nausea And Vomiting   Penicillins Hives    PAST MEDICAL HISTORY Past Medical History:  Diagnosis Date   AAA (abdominal aortic aneurysm)    s/p endovascular repair 2004   Aortic insufficiency    Prosthetic valve dysfunction   Aortic stenosis    s/p AVR in 2003, bovine   Arthritis    BCC (basal cell carcinoma) 09/17/2012   BPH (benign prostatic hyperplasia)    CAD (coronary artery disease)    stents in LXC and has a totally occluded RCA   Chicken pox    CVA (cerebral infarction) 12/09   Depression    Diverticulosis    Glaucoma    recently  told by Dr Janyth Contes he did not have it   HTN (hypertension)    Hyperlipemia    Leaky heart valve    Measles    Mumps    Myocardial infarction [facility])    Personal history of poliomyelitis 09/17/2012   In childhood   Pneumonia    hx of   Polio    Prosthetic valve dysfunction    Aortic stenosis and insufficiency    Retinal hemorrhage of right eye 11/02/2019   Right iliac artery stenosis (HCC)    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   23 mm Edwards Sapien transcatheter heart valve placed via open left transfemoral approach   Shortness of breath    exertion, orthopnea   Sleeping difficulties    Stroke [facility])    no residual   Thyroid disease    Past Surgical History:  Procedure Laterality Date   ABDOMINAL AORTIC ANEURYSM REPAIR  2004   s/p endovascular repiar of AAA in 2004    AORTIC VALVE REPLACEMENT  2003   #29mm pericardial valve    APPENDECTOMY     CARDIAC CATHETERIZATION     CORONARY ANGIOPLASTY     CORONARY ARTERY BYPASS GRAFT     CORONARY STENT PLACEMENT     INTRAOPERATIVE TRANSESOPHAGEAL ECHOCARDIOGRAM N/A 03/23/2014   Procedure: INTRAOPERATIVE TRANSESOPHAGEAL ECHOCARDIOGRAM;  Surgeon: Sherren Mocha, MD;  Location: [place];  Service: Open Heart Surgery;  Laterality: N/A;   JOINT REPLACEMENT     LEFT AND RIGHT HEART CATHETERIZATION WITH CORONARY ANGIOGRAM N/A 02/08/2014   Procedure: LEFT AND RIGHT HEART CATHETERIZATION WITH CORONARY ANGIOGRAM;  Surgeon: Blane Ohara, MD;  Location: [facility] CATH LAB;  Service: Cardiovascular;  Laterality: N/A;   TEE WITHOUT CARDIOVERSION N/A 09/02/2013   Procedure: TRANSESOPHAGEAL ECHOCARDIOGRAM (TEE);  Surgeon: Dorothy Spark, MD;  Location: [place];  Service: Cardiovascular;  Laterality: N/A;   TONSILLECTOMY     TRANSCATHETER AORTIC VALVE REPLACEMENT, TRANSFEMORAL N/A 03/23/2014   Procedure: TRANSCATHETER AORTIC VALVE REPLACEMENT, TRANSFEMORAL;  Surgeon: Sherren Mocha, MD;  Location: [place];  Service: Open Heart Surgery;  Laterality: N/A;  valve in valve TAVR   WISDOM TOOTH EXTRACTION      FAMILY HISTORY Family History  Problem Relation Age of Onset   Colon cancer Mother    Cancer Mother    Aneurysm Father    Heart attack Brother        CHF   Prostate cancer Brother    Cancer Brother    Heart disease Brother    Cancer Daughter     SOCIAL HISTORY Social History   Tobacco Use   Smoking status: Former    Packs/day: 0.50    Years: 30.00    Pack years: 15.00    Types: Cigarettes    Quit date: 10/04/1980    Years since quitting: 40.6   Smokeless tobacco: Never  Vaping Use   Vaping Use: Never used  Substance Use Topics   Alcohol use: Yes    Alcohol/week: 7.0 standard drinks    Types: 7 Shots of liquor per week    Comment: cocktails/night   Drug use: No         OPHTHALMIC EXAM:  Base Eye Exam      Visual Acuity (ETDRS)       Right Left   Dist cc 20/40 20/50   Dist ph cc NI 20/40 -2    Correction: Glasses         Tonometry (Tonopen, 1:22 PM)  Right Left   Pressure 5 6         Pupils       Pupils Dark Light Shape React APD   Right PERRL 4 3 Round Brisk None   Left PERRL 4 3 Round Brisk None         Visual Fields (Counting fingers)       Left Right    Full    Restrictions  Partial outer superior temporal, inferior temporal, superior nasal deficiencies         Extraocular Movement       Right Left    Full, Ortho Full, Ortho         Neuro/Psych     Oriented x3: Yes   Mood/Affect: Normal         Dilation     Both eyes: 1.0% Mydriacyl, 2.5% Phenylephrine @ 1:22 PM           Slit Lamp and Fundus Exam     External Exam       Right Left   External Normal Normal         Slit Lamp Exam       Right Left   Lids/Lashes Normal Normal   Conjunctiva/Sclera White and quiet White and quiet   Cornea Clear Clear   Anterior Chamber Deep and quiet Deep and quiet   Iris Round and reactive Round and reactive   Lens Posterior chamber intraocular lens Posterior chamber intraocular lens   Anterior Vitreous Normal Normal         Fundus Exam       Right Left   Posterior Vitreous Clear Clear   Disc Peripapillary atrophy Peripapillary atrophy   C/D Ratio 0.6 0.55   Macula Age related macular degeneration, Atrophy,  Advanced age related macular degeneration, [place]us[place], minor epiretinal membrane nasal to the fovea., Intermediate age related macular degeneration Age related macular degeneration, Atrophy,  Advanced age related macular degeneration, Drusen, Intermediate age related macular degeneration   Vessels Normal Normal   Periphery Normal Normal            IMAGING AND PROCEDURES  Imaging and Procedures for 05/04/21  OCT, Retina - OU - Both Eyes       Right Eye Quality was good. Scan locations included subfoveal. Central Foveal  Thickness: 269. Progression has improved. Findings include abnormal foveal contour.   Left Eye Quality was good. Scan locations included subfoveal. Central Foveal Thickness: 277. Progression has been stable. Findings include abnormal foveal contour.   Notes Much less intraretinal fluid temporal and superior to the fovea OD, improved condition macula OD after recent intravitreal Avastin delivered some 8 weeks previous and prior to that 8 weeks previous we will repeat injection today  OD looks great today with less subretinal fluid.  We will repeat Avastin today examination OD next in 10-week             ASSESSMENT/PLAN:  Right epiretinal membrane Minor no impact on acuity  Exudative age-related macular degeneration of right eye with active choroidal neovascularization (HCC) Active in the past peripapillary nasal to macula, less thickening in this region today at 8-week follow-up post Avastin.  We will repeat injection Avastin today and examination next in 10 weeks  Advanced nonexudative age-related macular degeneration of both eyes without subfoveal involvement The nature of age--related macular degeneration was discussed with the patient as well as the distinction between dry and wet types. Checking an Amsler Grid daily with advice to return immediately should  a distortion develop, was given to the patient. The patient 's smoking status now and in the past was determined and advice based on the AREDS study was provided regarding the consumption of antioxidant supplements. AREDS 2 vitamin formulation was recommended. Consumption of dark leafy vegetables and fresh fruits of various colors was recommended. Treatment modalities for wet macular degeneration particularly the use of intravitreal injections of anti-blood vessel growth factors was discussed with the patient. Avastin, Lucentis, and Eylea are the available options. On occasion, therapy includes the use of photodynamic therapy and  thermal laser. Stressed to the patient do not rub eyes.  Patient was advised to check Amsler Grid daily and return immediately if changes are noted. Instructions on using the grid were given to the patient. All patient questions were answered.     ICD-10-CM   1. Exudative age-related macular degeneration of right eye with active choroidal neovascularization (HCC)  H35.3211 OCT, Retina - OU - Both Eyes    2. Right epiretinal membrane  H35.371     3. Advanced nonexudative age-related macular degeneration of both eyes without subfoveal involvement  H35.3133       1.  OD, with much less active CNVM nasal to  macula.  We will repeat trial of vitreal Avastin today at 8-week interval examination next OU in 10 weeks  2.  3.  Ophthalmic Meds Ordered this visit:  No orders of the defined types were placed in this encounter.      Return in about 10 weeks (around 07/13/2021) for DILATE OU, AVASTIN OCT, OD.  There are no Patient Instructions on file for this visit.   Explained the diagnoses, plan, and follow up with the patient and they expressed understanding.  Patient expressed understanding of the importance of proper follow up care.   Clent Demark Avalee Castrellon M.D. Diseases & Surgery of the Retina and Vitreous [facility]ina [facility] Di[facility] [place] 05/04/21     Abbreviations: M myopia (nearsighted); A astigmatism; H hyperopia (farsighted); P presbyopia; Mrx spectacle prescription;  CTL contact lenses; OD right eye; OS left eye; OU both eyes  XT exotropia; ET esotropia; PEK punctate epithelial keratitis; PEE punctate epithelial erosions; DES dry eye syndrome; MGD meibomian gland dysfunction; ATs artificial tears; PFAT's preservative free artificial tears; [place] nuclear sclerotic cataract; PSC posterior subcapsular cataract; ERM epi-retinal membrane; PVD posterior vitreous detachment; RD retinal detachment; DM diabetes mellitus; DR diabetic retinopathy; NPDR non-proliferative diabetic retinopathy; PDR  proliferative diabetic retinopathy; CSME clinically significant macular edema; DME diabetic macular edema; dbh dot blot hemorrhages; CWS cotton wool spot; POAG primary open angle glaucoma; C/D cup-to-disc ratio; HVF humphrey visual field; GVF goldmann visual field; OCT optical coherence tomography; IOP intraocular pressure; BRVO Branch retinal vein occlusion; CRVO central retinal vein occlusion; CRAO central retinal artery occlusion; BRAO branch retinal artery occlusion; RT retinal tear; SB scleral buckle; PPV pars plana vitrectomy; VH Vitreous hemorrhage; PRP panretinal laser photocoagulation; IVK intravitreal kenalog; VMT vitreomacular traction; MH Macular hole;  NVD neovascularization of the disc; NVE neovascularization elsewhere; AREDS age related eye disease study; ARMD age related macular degeneration; POAG primary open angle glaucoma; EBMD epithelial/anterior basement membrane dystrophy; ACIOL anterior chamber intraocular lens; IOL intraocular lens; PCIOL posterior chamber intraocular lens; Phaco/IOL phacoemulsification with intraocular lens placement; [place] photorefractive keratectomy; LASIK laser assisted in situ keratomileusis; HTN hypertension; DM diabetes mellitus; COPD chronic obstructive pulmonary disease

## 2021-05-04 NOTE — Assessment & Plan Note (Signed)
Active in the past peripapillary nasal to macula, less thickening in this region today at 8-week follow-up post Avastin.  We will repeat injection Avastin today and examination next in 10 weeks

## 2021-05-04 NOTE — Assessment & Plan Note (Signed)
Minor no impact on acuity 

## 2021-05-17 ENCOUNTER — Other Ambulatory Visit: Payer: Self-pay

## 2021-05-17 ENCOUNTER — Encounter: Payer: Self-pay | Admitting: Physician Assistant

## 2021-05-17 ENCOUNTER — Ambulatory Visit (INDEPENDENT_AMBULATORY_CARE_PROVIDER_SITE_OTHER): Payer: Medicare Other | Admitting: Physician Assistant

## 2021-05-17 VITALS — BP 132/50 | HR 78 | Ht 66.0 in | Wt 190.6 lb

## 2021-05-17 DIAGNOSIS — I5033 Acute on chronic diastolic (congestive) heart failure: Secondary | ICD-10-CM

## 2021-05-17 DIAGNOSIS — R0600 Dyspnea, unspecified: Secondary | ICD-10-CM

## 2021-05-17 DIAGNOSIS — I259 Chronic ischemic heart disease, unspecified: Secondary | ICD-10-CM

## 2021-05-17 DIAGNOSIS — I2510 Atherosclerotic heart disease of native coronary artery without angina pectoris: Secondary | ICD-10-CM | POA: Diagnosis not present

## 2021-05-17 DIAGNOSIS — T82857D Stenosis of cardiac prosthetic devices, implants and grafts, subsequent encounter: Secondary | ICD-10-CM | POA: Diagnosis not present

## 2021-05-17 DIAGNOSIS — T82857A Stenosis of cardiac prosthetic devices, implants and grafts, initial encounter: Secondary | ICD-10-CM | POA: Insufficient documentation

## 2021-05-17 DIAGNOSIS — I38 Endocarditis, valve unspecified: Secondary | ICD-10-CM

## 2021-05-17 DIAGNOSIS — I10 Essential (primary) hypertension: Secondary | ICD-10-CM

## 2021-05-17 DIAGNOSIS — M79671 Pain in right foot: Secondary | ICD-10-CM

## 2021-05-17 DIAGNOSIS — I5030 Unspecified diastolic (congestive) heart failure: Secondary | ICD-10-CM | POA: Insufficient documentation

## 2021-05-17 MED ORDER — TORSEMIDE 20 MG PO TABS: 30.0000 mg | ORAL_TABLET | Freq: Every day | ORAL | 3 refills | Status: DC

## 2021-05-17 NOTE — Assessment & Plan Note (Signed)
Blood pressure is well controlled.  Continue metoprolol 25 mg twice daily.

## 2021-05-17 NOTE — Assessment & Plan Note (Signed)
S/p AVR in 2003 and TAVR in 2015 because of bioprosthetic AVR insufficiency.  He has had progressive bioprosthetic AVR stenosis.  He is not a surgical candidate.  We continue him on conservative management.  Continue SBE prophylaxis.

## 2021-05-17 NOTE — Patient Instructions (Signed)
Medication Instructions:   INCREASE Torsemide one and one half tablet ( 30 mg) daily.   *If you need a refill on your cardiac medications before your next appointment, please call your pharmacy*   Lab Work:  Your physician recommends that you return for lab work bmet Thursday, Nov 3.  You can come in the day of appointment between 7:30-4:30.    If you have labs (blood work) drawn today and your tests are completely normal, you will receive your results only by: [place] (if you have MyChart) OR A paper copy in the mail If you have any lab test that is abnormal or we need to change your treatment, we will call you to review the results.   Testing/Procedures:  -None   Follow-Up: At [facility], you and your health needs are our priority.  As part of our continuing mission to provide you with exceptional heart care, we have created designated Provider Care Teams.  These Care Teams include your primary Cardiologist (physician) and Advanced Practice Providers (APPs -  Physician Assistants and Nurse Practitioners) who all work together to provide you with the care you need, when you need it.  We recommend signing up for the patient portal called "MyChart".  Sign up information is provided on this After Visit Summary.  MyChart is used to connect with patients for Virtual Visits (Telemedicine).  Patients are able to view lab/test results, encounter notes, upcoming appointments, etc.  Non-urgent messages can be sent to your provider as well.   To learn more about what you can do with MyChart, go to [email or web address].    Your next appointment:   4 month(s)  The format for your next appointment:   In Person  Provider:   Richardson Dopp, PA-C   Other Instructions

## 2021-05-17 NOTE — Progress Notes (Signed)
Cardiology Office Note:    Date:  05/17/2021   ID:  [name] [name], DOB 1922-07-28, MRN [id number]  PCP:  Patient, No Pcp Per (Inactive)   [facility] Providers Cardiologist:  Sherren Mocha, MD Cardiology APP:  [place] [place]    Referring MD: No ref. provider found   Chief Complaint:  Follow-up for CHF    Patient Profile:   [name] [name] is a 87 y.o. male with:  Coronary artery disease S/p remote inferior MI Hx of stent to LCx; known CTO of RCA Aortic valve stenosis S/p AVR in 2003 Bioprosthetic AVR insufficiency s/p valve in valve TAVR in 9/15 According to notes - concern for progressive AS >> conservative mgmt (HFpEF) heart failure with preserved ejection fraction Abdominal aortic aneurysm s/p repair in 2004 Peripheral arterial disease Hx of CVA Hypertension Hyperlipidemia BPH Hypothyroidism   History of Present Illness: [name] [name] was last seen in July 2022.  I adjusted his diuretics for continued volume excess.  He also admitted to a diet high in salt.  We discussed reducing his salt intake.  He has had progressive aortic stenosis of his prosthetic valve.  I have avoided proceeding with a follow-up echocardiogram as he prefers conservative management.  He is not a surgical candidate.  He returns for follow-up.  He is here with his daughter.  He has had some R foot pain.  It kept him up last night.  His pain is over his 1st metatarsal.  He went to lunch at [employer] and [employer].  When getting into the car, he slipped and scraped his knee.  He notes significant weakness.  He has not had any chest pain.  He continues to have significant shortness of breath.  He occasionally has to sleep in a recliner due to shortness of breath.  He has some ankle edema but no other significant leg edema.  ASSESSMENT & PLAN:   (HFpEF) heart failure with preserved ejection fraction ([place]) He remains volume overloaded.  I have tried increasing his torsemide briefly in the past.  His  weight is down.  However, I have recommended that we increase his torsemide to 30 mg daily.  Continue current dose of potassium.  Obtain follow-up BMET in 1 week.  Follow-up with me in 4 months.  s/p AVR in 2003, TAVR in 2015 (valve in valve) with progressive prosthetic valve stenosis S/p AVR in 2003 and TAVR in 2015 because of bioprosthetic AVR insufficiency.  He has had progressive bioprosthetic AVR stenosis.  He is not a surgical candidate.  We continue him on conservative management.  Continue SBE prophylaxis.  Essential hypertension Blood pressure is well controlled.  Continue metoprolol 25 mg twice daily.  Coronary artery disease s/p Inf MI, stent to LCx, known CTO of RCA Remote history of inferior MI.  Cardiac catheterization 2015 demonstrated patent LCx stent.  He is not having anginal symptoms.  Continue aspirin 81 mg daily, metoprolol tartrate 25 mg twice daily.  Foot pain, right He was worried that he has gout.  This does not appear to be gout.  He does not have any joint pain.  No obvious fracture.  I offered to get an x-ray but after further discussion, we decided to hold off.  Conservative management.  He can use ice, elevation and 1 to 2 days of NSAIDs.       Dispo:  Return in about 4 months (around 09/17/2021) for Routine Follow Up, w/ Richardson Dopp, PA-C.    Prior CV  studies: Echocardiogram 11/07/16 Moderate LVH, EF 50-55, no RWMA, GR 1 DD, s/p TAVR with severe AS (mean gradient 41 mmHg), mild MR, normal RVSF, trivial TR, PASP 15   Cardiac catheterization 02/08/14 LM patent LAD patent LCx mid 66, mid stent ok RCA mid 95 (small vessel)     Past Medical History:  Diagnosis Date   AAA (abdominal aortic aneurysm)    s/p endovascular repair 2004   Aortic insufficiency    Prosthetic valve dysfunction   Aortic stenosis    s/p AVR in 2003, bovine   Arthritis    BCC (basal cell carcinoma) 09/17/2012   BPH (benign prostatic hyperplasia)    CAD (coronary artery disease)     stents in LXC and has a totally occluded RCA   Chicken pox    CVA (cerebral infarction) 12/09   Depression    Diverticulosis    Glaucoma    recently told by Dr Janyth Contes he did not have it   HTN (hypertension)    Hyperlipemia    Leaky heart valve    Measles    Mumps    Myocardial infarction [facility])    Personal history of poliomyelitis 09/17/2012   In childhood   Pneumonia    hx of   Polio    Prosthetic valve dysfunction    Aortic stenosis and insufficiency    Retinal hemorrhage of right eye 11/02/2019   Right iliac artery stenosis (HCC)    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   aspirin 81 MG tablet Take 81 mg by mouth daily.   B Complex-C (SUPER B COMPLEX) TABS Take 1 tablet by mouth daily.   clindamycin (CLEOCIN) 150 MG capsule TK 4 CS PO PRIOR TO DENTAL APPOINTMENT   finasteride (PROSCAR) 5 MG tablet Take 1 tablet (5 mg total) by mouth daily.   ipratropium (ATROVENT) 0.03 % nasal spray Place 2 sprays into both nostrils as needed.   levothyroxine (SYNTHROID) 50 MCG tablet Take 1 tablet (50 mcg total) by mouth daily.   metoprolol tartrate (LOPRESSOR) 25 MG tablet Take 1 tablet (25 mg total) by mouth 2 (two) times daily.   Multiple Vitamins-Minerals (PRESERVISION AREDS 2 PO) Take 1 capsule by mouth 2 (two) times daily.    PARoxetine (PAXIL) 20 MG tablet Take 0.5 tablets (10 mg total) by mouth every morning.   potassium chloride (MICRO-K) 10 MEQ CR capsule Take 1 capsule (10 mEq total) by mouth daily.   [DISCONTINUED] torsemide (DEMADEX) 20 MG tablet Take 1 tablet (20 mg total) by mouth daily.     Allergies:   Lasix [furosemide], Mucinex [guaifenesin er], Amoxicillin-pot clavulanate, Morphine and related, and Penicillins   Social History   Tobacco Use   Smoking status: Former    Packs/day: 0.50    Years: 30.00    Pack years: 15.00    Types: Cigarettes    Quit date: 10/04/1980    Years since quitting: 40.6   Smokeless tobacco: Never  Vaping Use   Vaping Use: Never used  Substance Use Topics   Alcohol use: Yes    Alcohol/week: 7.0  standard drinks    Types: 7 Shots of liquor per week    Comment: cocktails/night   Drug use: No    Family Hx: The patient's family history includes Aneurysm in his father; Cancer in his brother, daughter, and mother; Colon cancer in his mother; Heart attack in his brother; Heart disease in his brother; Prostate cancer in his brother.  Review of Systems  HENT:  Positive for congestion.   Gastrointestinal:  Negative for hematochezia.  Genitourinary:  Negative for hematuria.    EKGs/Labs/Other Test Reviewed:    EKG:  EKG is not ordered today.  The ekg ordered today demonstrates n/a  Recent Labs: 02/20/2021: BUN 28; Creatinine, Ser 1.58; Potassium 4.4; Sodium 148   Recent Lipid Panel Lab Results  Component Value Date/Time   CHOL 177 01/14/2019 11:46 AM   TRIG 145 01/14/2019 11:46 AM   HDL 39 (L) 01/14/2019 11:46 AM   LDLCALC 109 (H) 01/14/2019 11:46 AM    Risk Assessment/Calculations:        Physical Exam:    VS:  BP (!) 132/50   Pulse 78   Ht 5\' 6"  (1.676 m)   Wt 190 lb 9.6 oz (86.5 kg)   SpO2 91%   BMI 30.76 kg/m     Wt Readings from Last 3 Encounters:  05/17/21 190 lb 9.6 oz (86.5 kg)  02/07/21 197 lb 6.4 oz (89.5 kg)  11/01/20 198 lb 6.4 oz (90 kg)    Constitutional:      Appearance: Healthy appearance. Not in distress.  Neck:     Vascular: JVD normal.  Pulmonary:     Effort: Pulmonary effort is normal.     Breath sounds: No wheezing. Bibasilar Rales present.  Cardiovascular:     Normal rate. Regular rhythm. Normal  S1. Normal S2.      Murmurs: There is a grade 3/6 crescendo-decrescendo systolic murmur at the URSB.  Edema:    Peripheral edema absent.  Abdominal:     Palpations: Abdomen is soft.  Skin:    General: Skin is warm and dry.  Neurological:     General: No focal deficit present.     Mental Status: Alert and oriented to person, place and time.     Cranial Nerves: Cranial nerves are intact.     Medication Adjustments/Labs and Tests Ordered: Current medicines are reviewed at length with the patient today.  Concerns regarding medicines are outlined above.  Tests Ordered: No orders of the defined types were placed in this encounter.  Medication Changes: Meds ordered this encounter  Medications   torsemide (DEMADEX) 20 MG tablet    Sig: Take 1.5 tablets (30 mg total) by mouth daily.    Dispense:  135 tablet    Refill:  3    Signed, Richardson Dopp, PA-C  05/17/2021 4:17 PM    [facility] [address], [place], [place]  [address] Phone: [phone]; Fax: [phone]

## 2021-05-17 NOTE — Assessment & Plan Note (Signed)
Remote history of inferior MI.  Cardiac catheterization 2015 demonstrated patent LCx stent.  He is not having anginal symptoms.  Continue aspirin 81 mg daily, metoprolol tartrate 25 mg twice daily.

## 2021-05-17 NOTE — Assessment & Plan Note (Signed)
He was worried that he has gout.  This does not appear to be gout.  He does not have any joint pain.  No obvious fracture.  I offered to get an x-ray but after further discussion, we decided to hold off.  Conservative management.  He can use ice, elevation and 1 to 2 days of NSAIDs.

## 2021-05-17 NOTE — Assessment & Plan Note (Signed)
He remains volume overloaded.  I have tried increasing his torsemide briefly in the past.  His weight is down.  However, I have recommended that we increase his torsemide to 30 mg daily.  Continue current dose of potassium.  Obtain follow-up BMET in 1 week.  Follow-up with me in 4 months.

## 2021-05-18 ENCOUNTER — Other Ambulatory Visit: Payer: Self-pay

## 2021-05-18 MED ORDER — FINASTERIDE 5 MG PO TABS: 5.0000 mg | ORAL_TABLET | Freq: Every day | ORAL | 3 refills | Status: AC

## 2021-05-18 MED ORDER — METOPROLOL TARTRATE 25 MG PO TABS: 25.0000 mg | ORAL_TABLET | Freq: Two times a day (BID) | ORAL | 3 refills | Status: AC

## 2021-05-18 MED ORDER — POTASSIUM CHLORIDE ER 10 MEQ PO CPCR: 10.0000 meq | ORAL_CAPSULE | Freq: Every day | ORAL | 3 refills | Status: AC

## 2021-05-18 MED ORDER — LEVOTHYROXINE SODIUM 50 MCG PO TABS: 50.0000 ug | ORAL_TABLET | Freq: Every day | ORAL | 3 refills | Status: AC

## 2021-05-22 ENCOUNTER — Other Ambulatory Visit: Payer: Self-pay | Admitting: *Deleted

## 2021-05-22 MED ORDER — PAROXETINE HCL 20 MG PO TABS: 10.0000 mg | ORAL_TABLET | ORAL | 11 refills | Status: AC

## 2021-05-25 ENCOUNTER — Other Ambulatory Visit: Payer: Medicare Other

## 2021-05-25 ENCOUNTER — Other Ambulatory Visit: Payer: Self-pay

## 2021-05-25 DIAGNOSIS — I5032 Chronic diastolic (congestive) heart failure: Secondary | ICD-10-CM

## 2021-05-26 LAB — BASIC METABOLIC PANEL
BUN/Creatinine Ratio: 15 (ref 10–24)
BUN: 24 mg/dL (ref 10–36)
CO2: 27 mmol/L (ref 20–29)
Calcium: 9 mg/dL (ref 8.6–10.2)
Chloride: 102 mmol/L (ref 96–106)
Creatinine, Ser: 1.57 mg/dL — ABNORMAL HIGH (ref 0.76–1.27)
Glucose: 91 mg/dL (ref 70–99)
Potassium: 4.5 mmol/L (ref 3.5–5.2)
Sodium: 142 mmol/L (ref 134–144)
eGFR: 40 mL/min/{1.73_m2} — ABNORMAL LOW (ref >59)

## 2021-07-13 ENCOUNTER — Encounter (INDEPENDENT_AMBULATORY_CARE_PROVIDER_SITE_OTHER): Payer: Medicare Other | Admitting: Ophthalmology

## 2021-07-27 ENCOUNTER — Encounter (INDEPENDENT_AMBULATORY_CARE_PROVIDER_SITE_OTHER): Payer: Medicare Other | Admitting: Ophthalmology

## 2021-08-02 ENCOUNTER — Encounter (INDEPENDENT_AMBULATORY_CARE_PROVIDER_SITE_OTHER): Payer: Medicare Other | Admitting: Ophthalmology

## 2021-08-15 ENCOUNTER — Encounter (INDEPENDENT_AMBULATORY_CARE_PROVIDER_SITE_OTHER): Payer: Self-pay | Admitting: Ophthalmology

## 2021-08-15 ENCOUNTER — Other Ambulatory Visit: Payer: Self-pay

## 2021-08-15 ENCOUNTER — Ambulatory Visit (INDEPENDENT_AMBULATORY_CARE_PROVIDER_SITE_OTHER): Payer: Medicare Other | Admitting: Ophthalmology

## 2021-08-15 DIAGNOSIS — H353211 Exudative age-related macular degeneration, right eye, with active choroidal neovascularization: Secondary | ICD-10-CM

## 2021-08-15 DIAGNOSIS — H353133 Nonexudative age-related macular degeneration, bilateral, advanced atrophic without subfoveal involvement: Secondary | ICD-10-CM | POA: Diagnosis not present

## 2021-08-15 DIAGNOSIS — H35371 Puckering of macula, right eye: Secondary | ICD-10-CM

## 2021-08-15 MED ORDER — BEVACIZUMAB 2.5 MG/0.1ML IZ SOSY
2.5000 mg | PREFILLED_SYRINGE | INTRAVITREAL | Status: AC | PRN
  Administered 2021-08-15: 16:00:00 2.5 mg via INTRAVITREAL

## 2021-08-15 NOTE — Assessment & Plan Note (Signed)
History of recurrences in the past, now at 56-month follow-up interval that means 6 weeks off of therapeutic effect of Avastin.  We will repeat injection today  And follow-up in 4 months evaluate and then inject as needed

## 2021-08-15 NOTE — Progress Notes (Signed)
08/15/2021     CHIEF COMPLAINT Patient presents for  Chief Complaint  Patient presents with   Retina Follow Up      HISTORY OF PRESENT ILLNESS: [name] [name] is a 88 y.o. male who presents to the clinic today for:   HPI     Retina Follow Up           Diagnosis: Wet AMD   Laterality: right eye   Onset: 3 months ago   Severity: mild   Duration: 3 months         Comments   3 mos fu, 5 mos ( 23.5 weeks)since last Avastin Injection in OD, fu today OU oct Avastin OD. Patient states vision is stable and unchanged since last visit. Denies any new floaters or FOL. Pt states "I have trouble sleeping at night because of the sinus drainage I deal with. I had my daughter get my something to help, it is called "AYR" and I put it on a q-tip and put some in my nostrils and it helps."      Last edited by Laurin Coder on 08/15/2021  3:17 PM.      Referring physician: No referring provider defined for this encounter.  HISTORICAL INFORMATION:   Selected notes from the MEDICAL RECORD NUMBER    Lab Results  Component Value Date   HGBA1C 6.3 (H) 03/19/2014     CURRENT MEDICATIONS: No current outpatient medications on file. (Ophthalmic Drugs)   No current facility-administered medications for this visit. (Ophthalmic Drugs)   Current Outpatient Medications (Other)  Medication Sig   acetaminophen (TYLENOL) 500 MG tablet Take 1,000 mg by mouth every 4 (four) hours as needed.   aspirin 81 MG tablet Take 81 mg by mouth daily.   B Complex-C (SUPER B COMPLEX) TABS Take 1 tablet by mouth daily.   clindamycin (CLEOCIN) 150 MG capsule TK 4 CS PO PRIOR TO DENTAL APPOINTMENT   finasteride (PROSCAR) 5 MG tablet Take 1 tablet (5 mg total) by mouth daily.   ipratropium (ATROVENT) 0.03 % nasal spray Place 2 sprays into both nostrils as needed.   levothyroxine (SYNTHROID) 50 MCG tablet Take 1 tablet (50 mcg total) by mouth daily.   metoprolol tartrate (LOPRESSOR) 25 MG tablet Take 1  tablet (25 mg total) by mouth 2 (two) times daily.   Multiple Vitamins-Minerals (PRESERVISION AREDS 2 PO) Take 1 capsule by mouth 2 (two) times daily.    PARoxetine (PAXIL) 20 MG tablet Take 0.5 tablets (10 mg total) by mouth every morning.   potassium chloride (MICRO-K) 10 MEQ CR capsule Take 1 capsule (10 mEq total) by mouth daily.   torsemide (DEMADEX) 20 MG tablet Take 1.5 tablets (30 mg total) by mouth daily.   No current facility-administered medications for this visit. (Other)      REVIEW OF SYSTEMS:    ALLERGIES Allergies  Allergen Reactions   Lasix [Furosemide] Swelling    Tongue swelling   Mucinex [Guaifenesin Er]     Tongue swelling   Amoxicillin-Pot Clavulanate Itching   Morphine And Related Nausea And Vomiting   Penicillins Hives    PAST MEDICAL HISTORY Past Medical History:  Diagnosis Date   AAA (abdominal aortic aneurysm)    s/p endovascular repair 2004   Aortic insufficiency    Prosthetic valve dysfunction   Aortic stenosis    s/p AVR in 2003, bovine   Arthritis    BCC (basal cell carcinoma) 09/17/2012   BPH (benign prostatic hyperplasia)  CAD (coronary artery disease)    stents in [facility] and has a totally occluded RCA   Chicken pox    CVA (cerebral infarction) 12/09   Depression    Diverticulosis    Glaucoma    recently told by Dr Janyth Contes he did not have it   HTN (hypertension)    Hyperlipemia    Leaky heart valve    Measles    Mumps    Myocardial infarction [facility])    Personal history of poliomyelitis 09/17/2012   In childhood   Pneumonia    hx of   Polio    Prosthetic valve dysfunction    Aortic stenosis and insufficiency    Retinal hemorrhage of right eye 11/02/2019   Right iliac artery stenosis (HCC)    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   23 mm Edwards Sapien transcatheter heart valve placed via open left transfemoral approach   Shortness of breath    exertion, orthopnea   Sleeping difficulties    Stroke [facility])     no residual   Thyroid disease    Past Surgical History:  Procedure Laterality Date   ABDOMINAL AORTIC ANEURYSM REPAIR  2004   s/p endovascular repiar of AAA in 2004   AORTIC VALVE REPLACEMENT  2003   #33mm pericardial valve    APPENDECTOMY     CARDIAC CATHETERIZATION     CORONARY ANGIOPLASTY     CORONARY ARTERY BYPASS GRAFT     CORONARY STENT PLACEMENT     INTRAOPERATIVE TRANSESOPHAGEAL ECHOCARDIOGRAM N/A 03/23/2014   Procedure: INTRAOPERATIVE TRANSESOPHAGEAL ECHOCARDIOGRAM;  Surgeon: Sherren Mocha, MD;  Location: [place];  Service: Open Heart Surgery;  Laterality: N/A;   JOINT REPLACEMENT     LEFT AND RIGHT HEART CATHETERIZATION WITH CORONARY ANGIOGRAM N/A 02/08/2014   Procedure: LEFT AND RIGHT HEART CATHETERIZATION WITH CORONARY ANGIOGRAM;  Surgeon: Blane Ohara, MD;  Location: [facility] CATH LAB;  Service: Cardiovascular;  Laterality: N/A;   TEE WITHOUT CARDIOVERSION N/A 09/02/2013   Procedure: TRANSESOPHAGEAL ECHOCARDIOGRAM (TEE);  Surgeon: Dorothy Spark, MD;  Location: [place];  Service: Cardiovascular;  Laterality: N/A;   TONSILLECTOMY     TRANSCATHETER AORTIC VALVE REPLACEMENT, TRANSFEMORAL N/A 03/23/2014   Procedure: TRANSCATHETER AORTIC VALVE REPLACEMENT, TRANSFEMORAL;  Surgeon: Sherren Mocha, MD;  Location: [place];  Service: Open Heart Surgery;  Laterality: N/A;  valve in valve TAVR   WISDOM TOOTH EXTRACTION      FAMILY HISTORY Family History  Problem Relation Age of Onset   Colon cancer Mother    Cancer Mother    Aneurysm Father    Heart attack Brother        CHF   Prostate cancer Brother    Cancer Brother    Heart disease Brother    Cancer Daughter     SOCIAL HISTORY Social History   Tobacco Use   Smoking status: Former    Packs/day: 0.50    Years: 30.00    Pack years: 15.00    Types: Cigarettes    Quit date: 10/04/1980    Years since quitting: 40.8   Smokeless tobacco: Never  Vaping Use   Vaping Use: Never used  Substance Use Topics   Alcohol  use: Yes    Alcohol/week: 7.0 standard drinks    Types: 7 Shots of liquor per week    Comment: cocktails/night   Drug use: No         OPHTHALMIC EXAM:  Base Eye Exam     Visual Acuity (ETDRS)  Right Left   Dist cc 20/50 20/40 -2   Dist ph cc NI NI    Correction: Glasses         Tonometry (Tonopen, 3:22 PM)       Right Left   Pressure 14 12         Pupils       Pupils Dark Light APD   Right PERRL 4 3 None   Left PERRL 4 3 None         Visual Fields       Left Right    Full Full         Extraocular Movement       Right Left    Full, Ortho Full, Ortho         Neuro/Psych     Oriented x3: Yes   Mood/Affect: Normal         Dilation     Both eyes: 1.0% Mydriacyl, 2.5% Phenylephrine @ 3:22 PM           Slit Lamp and Fundus Exam     External Exam       Right Left   External Normal Normal         Slit Lamp Exam       Right Left   Lids/Lashes Normal Normal   Conjunctiva/Sclera White and quiet White and quiet   Cornea Clear Clear   Anterior Chamber Deep and quiet Deep and quiet   Iris Round and reactive Round and reactive   Lens Posterior chamber intraocular lens Posterior chamber intraocular lens   Anterior Vitreous Normal Normal         Fundus Exam       Right Left   Posterior Vitreous Clear Clear   Disc Peripapillary atrophy Peripapillary atrophy   C/D Ratio 0.6 0.55   Macula Age related macular degeneration, Atrophy,  Advanced age related macular degeneration, Drusen, minor epiretinal membrane nasal to the fovea., Intermediate age related macular degeneration Age related macular degeneration, Atrophy,  Advanced age related macular degeneration, Drusen, Intermediate age related macular degeneration   Vessels Normal Normal   Periphery Normal Normal            IMAGING AND PROCEDURES  Imaging and Procedures for 08/15/21  Intravitreal Injection, Pharmacologic Agent - OD - Right Eye       Time  Out 08/15/2021. 4:09 PM. Confirmed correct patient, procedure, site, and patient consented.   Anesthesia Topical anesthesia was used. Anesthetic medications included Lidocaine 4%.   Procedure Preparation included Ofloxacin , 10% betadine to eyelids, 5% betadine to ocular surface. A 30 gauge needle was used.   Injection: 2.5 mg bevacizumab 2.5 MG/0.1ML   Route: Intravitreal, Site: Right Eye   NDC: [phone], Lot: [id number]   Post-op Post injection exam found visual acuity of at least counting fingers. The patient tolerated the procedure well. There were no complications. The patient received written and verbal post procedure care education. Post injection medications were not given.      OCT, Retina - OU - Both Eyes       Right Eye Quality was good. Scan locations included subfoveal. Central Foveal Thickness: 295. Progression has improved. Findings include abnormal foveal contour.   Left Eye Quality was good. Scan locations included subfoveal. Central Foveal Thickness: 276. Progression has been stable. Findings include abnormal foveal contour.   Notes Much less intraretinal fluid temporal and superior to the fovea OD, improved condition macula OD after recent intravitreal Avastin delivered some  14 weeks previous and prior to that 14 weeks previous we will repeat injection today  OD looks great today with less subretinal fluid.  We will repeat exam in 4 months             ASSESSMENT/PLAN:  Advanced nonexudative age-related macular degeneration of both eyes without subfoveal involvement Atrophy peripapillary atrophy but not into the FAZ at this point  Exudative age-related macular degeneration of right eye with active choroidal neovascularization ([place]) History of recurrences in the past, now at 32-month follow-up interval that means 6 weeks off of therapeutic effect of Avastin.  We will repeat injection today  And follow-up in 4 months evaluate and then inject as  needed  Right epiretinal membrane Minor no impact on acuity     ICD-10-CM   1. Exudative age-related macular degeneration of right eye with active choroidal neovascularization (HCC)  H35.3211 Intravitreal Injection, Pharmacologic Agent - OD - Right Eye    OCT, Retina - OU - Both Eyes    bevacizumab (AVASTIN) SOSY 2.5 mg    2. Advanced nonexudative age-related macular degeneration of both eyes without subfoveal involvement  H35.3133     3. Right epiretinal membrane  H35.371       1.  OD extensive peripapillary atrophy.  Prior CNVM OD active in the past now with stability of findings and prevention of recurrence at 11-month follow-up post Avastin.  Repeat injection today and evaluation next OU dilate in 4 months.  2.  Treat OD or OS on a as needed basis next  3.  Ophthalmic Meds Ordered this visit:  Meds ordered this encounter  Medications   bevacizumab (AVASTIN) SOSY 2.5 mg       Return in about 4 months (around 12/13/2021) for DILATE OU, COLOR FP, OCT.  There are no Patient Instructions on file for this visit.   Explained the diagnoses, plan, and follow up with the patient and they expressed understanding.  Patient expressed understanding of the importance of proper follow up care.   Clent Demark Christiane Sistare M.D. Diseases & Surgery of the Retina and Vitreous [facility]ina [facility] Diabetic [place] 08/15/21     Abbreviations: M myopia (nearsighted); A astigmatism; H hyperopia (farsighted); P presbyopia; Mrx spectacle prescription;  CTL contact lenses; OD right eye; OS left eye; OU both eyes  XT exotropia; ET esotropia; PEK punctate epithelial keratitis; PEE punctate epithelial erosions; DES dry eye syndrome; MGD meibomian gland dysfunction; ATs artificial tears; PFAT's preservative free artificial tears; [place] nuclear sclerotic cataract; PSC posterior subcapsular cataract; ERM epi-retinal membrane; PVD posterior vitreous detachment; RD retinal detachment; DM diabetes mellitus; DR diabetic  retinopathy; NPDR non-proliferative diabetic retinopathy; PDR proliferative diabetic retinopathy; CSME clinically significant macular edema; DME diabetic macular edema; dbh dot blot hemorrhages; CWS cotton wool spot; POAG primary open angle glaucoma; C/D cup-to-disc ratio; HVF humphrey visual field; GVF goldmann visual field; OCT optical coherence tomography; IOP intraocular pressure; BRVO Branch retinal vein occlusion; CRVO central retinal vein occlusion; CRAO central retinal artery occlusion; BRAO branch retinal artery occlusion; RT retinal tear; SB scleral buckle; PPV pars plana vitrectomy; VH Vitreous hemorrhage; PRP panretinal laser photocoagulation; IVK intravitreal kenalog; VMT vitreomacular traction; MH Macular hole;  NVD neovascularization of the disc; NVE neovascularization elsewhere; AREDS age related eye disease study; ARMD age related macular degeneration; POAG primary open angle glaucoma; EBMD epithelial/anterior basement membrane dystrophy; ACIOL anterior chamber intraocular lens; IOL intraocular lens; PCIOL posterior chamber intraocular lens; Phaco/IOL phacoemulsification with intraocular lens placement; PRK photorefractive keratectomy; LASIK laser assisted in  situ keratomileusis; HTN hypertension; DM diabetes mellitus; COPD chronic obstructive pulmonary disease

## 2021-08-15 NOTE — Assessment & Plan Note (Signed)
Atrophy peripapillary atrophy but not into the FAZ at this point

## 2021-08-15 NOTE — Assessment & Plan Note (Signed)
Minor no impact on acuity 

## 2021-08-30 ENCOUNTER — Ambulatory Visit (INDEPENDENT_AMBULATORY_CARE_PROVIDER_SITE_OTHER): Payer: Medicare Other | Admitting: Physician Assistant

## 2021-08-30 ENCOUNTER — Other Ambulatory Visit: Payer: Self-pay

## 2021-08-30 ENCOUNTER — Encounter: Payer: Self-pay | Admitting: Physician Assistant

## 2021-08-30 VITALS — BP 132/58 | HR 62 | Ht 66.0 in | Wt 189.8 lb

## 2021-08-30 DIAGNOSIS — I5032 Chronic diastolic (congestive) heart failure: Secondary | ICD-10-CM

## 2021-08-30 DIAGNOSIS — T82857D Stenosis of cardiac prosthetic devices, implants and grafts, subsequent encounter: Secondary | ICD-10-CM | POA: Diagnosis not present

## 2021-08-30 DIAGNOSIS — I2510 Atherosclerotic heart disease of native coronary artery without angina pectoris: Secondary | ICD-10-CM | POA: Diagnosis not present

## 2021-08-30 MED ORDER — TORSEMIDE 20 MG PO TABS: 20.0000 mg | ORAL_TABLET | Freq: Two times a day (BID) | ORAL | 3 refills | Status: AC

## 2021-08-30 NOTE — Assessment & Plan Note (Signed)
History of remote inferior MI, PCI to the LCx and known chronic occlusion of the RCA.  He is not having chest pain.  Continue aspirin 81 mg daily.

## 2021-08-30 NOTE — Assessment & Plan Note (Addendum)
His weights are stable.  His exam has improved.  He continues to have NYHA III symptoms.  He also continues to have symptoms of orthopnea and PND.  His diet is rich in salt.  He eats a lot of frozen dinners.  He lives by himself.  I have asked him to try to decrease his salt load if possible.  He notes that his legs continue to get weaker.  I have encouraged him to increase activity.  He does have a stationary bike at home.  Increase torsemide to 20 mg twice daily, check BMET in 2 weeks.  Follow-up in 4 months.

## 2021-08-30 NOTE — Assessment & Plan Note (Signed)
History of AVR in 2003 and TAVR (valve in valve) in 2015.  He has had progressive bioprosthetic AVR stenosis.  He is not a surgical candidate.  He is a DNR.  Continue conservative management.  He has not had any anginal symptoms or syncope.

## 2021-08-30 NOTE — Patient Instructions (Addendum)
Medication Instructions:   INCREASE Torsemide one ( 1) tablet by mouth ( 20 mg ) twice daily.   *If you need a refill on your cardiac medications before your next appointment, please call your pharmacy*  Lab Work:  Your physician recommends that you return for lab work on Wednesday, February 22. You can come in on the day of your appointment anytime between 7:30-4:30.   If you have labs (blood work) drawn today and your tests are completely normal, you will receive your results only by: [place] (if you have MyChart) OR A paper copy in the mail If you have any lab test that is abnormal or we need to change your treatment, we will call you to review the results.  Testing/Procedures:  None ordered.   Follow-Up: At [facility], you and your health needs are our priority.  As part of our continuing mission to provide you with exceptional heart care, we have created designated Provider Care Teams.  These Care Teams include your primary Cardiologist (physician) and Advanced Practice Providers (APPs -  Physician Assistants and Nurse Practitioners) who all work together to provide you with the care you need, when you need it.  We recommend signing up for the patient portal called "MyChart".  Sign up information is provided on this After Visit Summary.  MyChart is used to connect with patients for Virtual Visits (Telemedicine).  Patients are able to view lab/test results, encounter notes, upcoming appointments, etc.  Non-urgent messages can be sent to your provider as well.   To learn more about what you can do with MyChart, go to [email or web address].    Your next appointment:   4 month(s)  The format for your next appointment:   In Person  Provider:   Richardson Dopp, PA-C        Other Instructions  Low-Sodium Eating Plan Sodium, which is an element that makes up salt, helps you maintain a healthy balance of fluids in your body. Too much sodium can increase your blood  pressure and cause fluid and waste to be held in your body. Your health care provider or dietitian may recommend following this plan if you have high blood pressure (hypertension), kidney disease, liver disease, or heart failure. Eating less sodium can help lower your blood pressure, reduce swelling, and protect your heart, liver, and kidneys. What are tips for following this plan? Reading food labels The Nutrition Facts label lists the amount of sodium in one serving of the food. If you eat more than one serving, you must multiply the listed amount of sodium by the number of servings. Choose foods with less than 140 mg of sodium per serving. Avoid foods with 300 mg of sodium or more per serving. Shopping  Look for lower-sodium products, often labeled as "low-sodium" or "no salt added." Always check the sodium content, even if foods are labeled as "unsalted" or "no salt added." Buy fresh foods. Avoid canned foods and pre-made or frozen meals. Avoid canned, cured, or processed meats. Buy breads that have less than 80 mg of sodium per slice. Cooking  Eat more home-cooked food and less restaurant, buffet, and fast food. Avoid adding salt when cooking. Use salt-free seasonings or herbs instead of table salt or sea salt. Check with your health care provider or pharmacist before using salt substitutes. Cook with plant-based oils, such as canola, sunflower, or olive oil. Meal planning When eating at a restaurant, ask that your food be prepared with less salt or no salt, if  possible. Avoid dishes labeled as brined, pickled, cured, smoked, or made with soy sauce, miso, or teriyaki sauce. Avoid foods that contain MSG (monosodium glutamate). MSG is sometimes added to [place] food, bouillon, and some canned foods. Make meals that can be grilled, baked, poached, roasted, or steamed. These are generally made with less sodium. General information Most people on this plan should limit their sodium intake to  1,500-2,000 mg (milligrams) of sodium each day. What foods should I eat? Fruits Fresh, frozen, or canned fruit. Fruit juice. Vegetables Fresh or frozen vegetables. "No salt added" canned vegetables. "No salt added" tomato sauce and paste. Low-sodium or reduced-sodium tomato and vegetable juice. Grains Low-sodium cereals, including oats, puffed wheat and rice, and shredded wheat. Low-sodium crackers. Unsalted rice. Unsalted pasta. Low-sodium bread. Whole-grain breads and whole-grain pasta. Meats and other proteins Fresh or frozen (no salt added) meat, poultry, seafood, and fish. Low-sodium canned tuna and salmon. Unsalted nuts. Dried peas, beans, and lentils without added salt. Unsalted canned beans. Eggs. Unsalted nut butters. Dairy Milk. Soy milk. Cheese that is naturally low in sodium, such as ricotta cheese, fresh mozzarella, or Swiss cheese. Low-sodium or reduced-sodium cheese. Cream cheese. Yogurt. Seasonings and condiments Fresh and dried herbs and spices. Salt-free seasonings. Low-sodium mustard and ketchup. Sodium-free salad dressing. Sodium-free light mayonnaise. Fresh or refrigerated horseradish. Lemon juice. Vinegar. Other foods Homemade, reduced-sodium, or low-sodium soups. Unsalted popcorn and pretzels. Low-salt or salt-free chips. The items listed above may not be a complete list of foods and beverages you can eat. Contact a dietitian for more information. What foods should I avoid? Vegetables Sauerkraut, pickled vegetables, and relishes. Olives. [place] fries. Onion rings. Regular canned vegetables (not low-sodium or reduced-sodium). Regular canned tomato sauce and paste (not low-sodium or reduced-sodium). Regular tomato and vegetable juice (not low-sodium or reduced-sodium). Frozen vegetables in sauces. Grains Instant hot cereals. Bread stuffing, pancake, and biscuit mixes. Croutons. Seasoned rice or pasta mixes. Noodle soup cups. Boxed or frozen macaroni and cheese. Regular  salted crackers. Self-rising flour. Meats and other proteins Meat or fish that is salted, canned, smoked, spiced, or pickled. Precooked or cured meat, such as sausages or meat loaves. Berniece Salines. Ham. Pepperoni. Hot dogs. Corned beef. Chipped beef. Salt pork. Jerky. Pickled herring. Anchovies and sardines. Regular canned tuna. Salted nuts. Dairy Processed cheese and cheese spreads. Hard cheeses. Cheese curds. Blue cheese. Feta cheese. String cheese. Regular cottage cheese. Buttermilk. Canned milk. Fats and oils Salted butter. Regular margarine. Ghee. Bacon fat. Seasonings and condiments Onion salt, garlic salt, seasoned salt, table salt, and sea salt. Canned and packaged gravies. Worcestershire sauce. Tartar sauce. Barbecue sauce. Teriyaki sauce. Soy sauce, including reduced-sodium. Steak sauce. Fish sauce. Oyster sauce. Cocktail sauce. Horseradish that you find on the shelf. Regular ketchup and mustard. Meat flavorings and tenderizers. Bouillon cubes. Hot sauce. Pre-made or packaged marinades. Pre-made or packaged taco seasonings. Relishes. Regular salad dressings. Salsa. Other foods Salted popcorn and pretzels. Corn chips and puffs. Potato and tortilla chips. Canned or dried soups. Pizza. Frozen entrees and pot pies. The items listed above may not be a complete list of foods and beverages you should avoid. Contact a dietitian for more information. Summary Eating less sodium can help lower your blood pressure, reduce swelling, and protect your heart, liver, and kidneys. Most people on this plan should limit their sodium intake to 1,500-2,000 mg (milligrams) of sodium each day. Canned, boxed, and frozen foods are high in sodium. Restaurant foods, fast foods, and pizza are also very high in sodium.  You also get sodium by adding salt to food. Try to cook at home, eat more fresh fruits and vegetables, and eat less fast food and canned, processed, or prepared foods. This information is not intended to  replace advice given to you by your health care provider. Make sure you discuss any questions you have with your health care provider. Document Revised: 08/14/2019 Document Reviewed: 06/10/2019 Elsevier Patient Education  2022 [employer].

## 2021-08-30 NOTE — Progress Notes (Signed)
Cardiology Office Note:    Date:  08/30/2021   ID:  [name] [name], DOB 1922-08-20, MRN [id number]  PCP:  Patient, No Pcp Per (Inactive)  [facility] Providers Cardiologist:  Sherren Mocha, MD Cardiology APP:  Sharmon Revere    Referring MD: No ref. 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 TAVR in 9/15 According to notes - concern for progressive AS >> conservative mgmt (HFpEF) heart failure with preserved ejection fraction Abdominal aortic aneurysm s/p repair in 2004 Peripheral arterial disease Hx of CVA Hypertension Hyperlipidemia BPH Hypothyroidism  DNR   Prior CV Studies: Echocardiogram 11/07/16 Moderate LVH, EF 50-55, no RWMA, GR 1 DD, s/p TAVR with severe AS (mean gradient 41 mmHg), mild MR, normal RVSF, trivial TR, PASP 15   Cardiac catheterization 02/08/14 LM patent LAD patent LCx mid 40, mid stent ok RCA mid 95 (small vessel)   History of Present Illness:   [name] [name] is a 88 y.o. male with the above problem list.  He was last seen in 10/22.  He returns for follow-up.  He is here with his son, [name].  He has continued to have shortness of breath with minimal activity.  He also sleeps in a recliner sometimes due to shortness of breath.  He occasionally awakens short of breath at night.  He has not had lower extremity edema, syncope, chest pain        Past Medical History:  Diagnosis Date   AAA (abdominal aortic aneurysm)    s/p endovascular repair 2004   Aortic insufficiency    Prosthetic valve dysfunction   Aortic stenosis    s/p AVR in 2003, bovine   Arthritis    BCC (basal cell carcinoma) 09/17/2012   BPH (benign prostatic hyperplasia)    CAD (coronary artery disease)    stents in LXC and has a totally occluded RCA   Chicken pox    CVA  (cerebral infarction) 12/09   Depression    Diverticulosis    Glaucoma    recently told by Dr Janyth Contes he did not have it   HTN (hypertension)    Hyperlipemia    Leaky heart valve    Measles    Mumps    Myocardial infarction [facility])    Personal history of poliomyelitis 09/17/2012   In childhood   Pneumonia    hx of   Polio    Prosthetic valve dysfunction    Aortic stenosis and insufficiency    Retinal hemorrhage of right eye 11/02/2019   Right iliac artery stenosis (HCC)    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   aspirin 81 MG tablet Take 81 mg by mouth daily.   B Complex-C (SUPER B COMPLEX) TABS Take 1 tablet by mouth daily.   clindamycin (CLEOCIN) 150 MG capsule TK 4 CS PO PRIOR TO DENTAL APPOINTMENT   finasteride (PROSCAR)  5 MG tablet Take 1 tablet (5 mg total) by mouth daily.   ipratropium (ATROVENT) 0.03 % nasal spray Place 2 sprays into both nostrils as needed.   levothyroxine (SYNTHROID) 50 MCG tablet Take 1 tablet (50 mcg total) by mouth daily.   metoprolol tartrate (LOPRESSOR) 25 MG tablet Take 1 tablet (25 mg total) by mouth 2 (two) times daily.   Multiple Vitamins-Minerals (PRESERVISION AREDS 2 PO) Take 1 capsule by mouth 2 (two) times daily.    PARoxetine (PAXIL) 20 MG tablet Take 0.5 tablets (10 mg total) by mouth every morning.   potassium chloride (MICRO-K) 10 MEQ CR capsule Take 1 capsule (10 mEq total) by mouth daily.   [DISCONTINUED] torsemide (DEMADEX) 20 MG tablet Take 1.5 tablets (30 mg total) by mouth daily.    Allergies:   Lasix [furosemide], Mucinex [guaifenesin er],  Amoxicillin-pot clavulanate, Morphine and related, and Penicillins   Social History   Tobacco Use   Smoking status: Former    Packs/day: 0.50    Years: 30.00    Pack years: 15.00    Types: Cigarettes    Quit date: 10/04/1980    Years since quitting: 40.9   Smokeless tobacco: Never  Vaping Use   Vaping Use: Never used  Substance Use Topics   Alcohol use: Yes    Alcohol/week: 7.0 standard drinks    Types: 7 Shots of liquor per week    Comment: cocktails/night   Drug use: No    Family Hx: The patient's family history includes Aneurysm in his father; Cancer in his brother, daughter, and mother; Colon cancer in his mother; Heart attack in his brother; Heart disease in his brother; Prostate cancer in his brother.  Review of Systems  HENT:  Positive for congestion and nosebleeds.   Gastrointestinal:  Negative for hematochezia.    EKGs/Labs/Other Test Reviewed:    EKG:  EKG is not ordered today.  The ekg ordered today demonstrates n/a  Recent Labs: 05/25/2021: BUN 24; Creatinine, Ser 1.57; Potassium 4.5; Sodium 142   Recent Lipid Panel No results for input(s): CHOL, TRIG, HDL, VLDL, LDLCALC, LDLDIRECT in the last 8760 hours.   Risk Assessment/Calculations:         Physical Exam:    VS:  BP (!) 132/58 (BP Location: Right Arm, Patient Position: Sitting, Cuff Size: Normal)    Pulse 62    Ht 5\' 6"  (1.676 m)    Wt 189 lb 12.8 oz (86.1 kg)    SpO2 92%    BMI 30.63 kg/m     Wt Readings from Last 3 Encounters:  08/30/21 189 lb 12.8 oz (86.1 kg)  05/17/21 190 lb 9.6 oz (86.5 kg)  02/07/21 197 lb 6.4 oz (89.5 kg)    Constitutional:      Appearance: Healthy appearance. Not in distress.  Neck:     Vascular: JVD normal.  Pulmonary:     Effort: Pulmonary effort is normal.     Breath sounds: No wheezing. No rales.  Cardiovascular:     Normal rate. Regular rhythm. Normal S1. Normal S2.      Murmurs: There is a grade 3/6 crescendo-decrescendo systolic murmur at the URSB.  Edema:     Peripheral edema absent.  Abdominal:     Palpations: Abdomen is soft.  Skin:    General: Skin is warm and dry.  Neurological:     General: No focal deficit present.     Mental Status: Alert and oriented to person, place and time.  Cranial Nerves: Cranial nerves are intact.        ASSESSMENT & PLAN:   (HFpEF) heart failure with preserved ejection fraction ([place]) His weights are stable.  His exam has improved.  He continues to have NYHA III symptoms.  He also continues to have symptoms of orthopnea and PND.  His diet is rich in salt.  He eats a lot of frozen dinners.  He lives by himself.  I have asked him to try to decrease his salt load if possible.  He notes that his legs continue to get weaker.  I have encouraged him to increase activity.  He does have a stationary bike at home.  Increase torsemide to 20 mg twice daily, check BMET in 2 weeks.  Follow-up in 4 months.  s/p AVR in 2003, TAVR in 2015 (valve in valve) with progressive prosthetic valve stenosis History of AVR in 2003 and TAVR (valve in valve) in 2015.  He has had progressive bioprosthetic AVR stenosis.  He is not a surgical candidate.  He is a DNR.  Continue conservative management.  He has not had any anginal symptoms or syncope.  Coronary artery disease s/p Inf MI, stent to LCx, known CTO of RCA History of remote inferior MI, PCI to the LCx and known chronic occlusion of the RCA.  He is not having chest pain.  Continue aspirin 81 mg daily.            Dispo:  Return in about 4 months (around 12/28/2021) for Routine Follow Up, w/ Richardson Dopp, PA-C.   Medication Adjustments/Labs and Tests Ordered: Current medicines are reviewed at length with the patient today.  Concerns regarding medicines are outlined above.  Tests Ordered: Orders Placed This Encounter  Procedures   Basic Metabolic Panel (BMET)   Medication Changes: Meds ordered this encounter  Medications   torsemide (DEMADEX) 20 MG tablet    Sig: Take 1 tablet  (20 mg total) by mouth 2 (two) times daily.    Dispense:  180 tablet    Refill:  3   Signed, Richardson Dopp, PA-C  08/30/2021 4:58 PM    [facility] [address], [place], [place]  [address] Phone: [phone]; Fax: [phone]

## 2021-09-12 ENCOUNTER — Telehealth: Payer: Self-pay | Admitting: *Deleted

## 2021-09-12 NOTE — Telephone Encounter (Signed)
Pt calling in today to cancel lab appt and [place] appt.  Stated it is not pt's heart it is old age and does not feel like he needs to be seen.  Will call if pt feels like pt needs to come in. Will send to [place] to [place].

## 2021-09-12 NOTE — Telephone Encounter (Signed)
His condition is exacerbated by chronic progressive heart disease in the setting of advanced age.  He will continue to worsen.  It sounds like he prefers fairly conservative management.  If he would like [place] to make a referral to Hospice, I am happy to do so. Richardson Dopp, PA-C    09/12/2021 9:17 PM

## 2021-09-13 ENCOUNTER — Other Ambulatory Visit: Payer: Medicare Other

## 2021-09-29 ENCOUNTER — Emergency Department (HOSPITAL_COMMUNITY): Payer: Medicare Other

## 2021-09-29 ENCOUNTER — Encounter (HOSPITAL_COMMUNITY): Payer: Self-pay

## 2021-09-29 ENCOUNTER — Inpatient Hospital Stay (HOSPITAL_COMMUNITY)
Admission: EM | Admit: 2021-09-29 | Discharge: 2021-10-21 | DRG: 871 | Disposition: E | Payer: Medicare Other | Attending: Family Medicine | Admitting: Family Medicine

## 2021-09-29 ENCOUNTER — Other Ambulatory Visit: Payer: Self-pay

## 2021-09-29 ENCOUNTER — Telehealth: Payer: Self-pay | Admitting: Cardiovascular Disease

## 2021-09-29 DIAGNOSIS — Z888 Allergy status to other drugs, medicaments and biological substances status: Secondary | ICD-10-CM

## 2021-09-29 DIAGNOSIS — N1832 Chronic kidney disease, stage 3b: Secondary | ICD-10-CM | POA: Diagnosis present

## 2021-09-29 DIAGNOSIS — I5032 Chronic diastolic (congestive) heart failure: Secondary | ICD-10-CM

## 2021-09-29 DIAGNOSIS — R0902 Hypoxemia: Secondary | ICD-10-CM

## 2021-09-29 DIAGNOSIS — Z79890 Hormone replacement therapy (postmenopausal): Secondary | ICD-10-CM

## 2021-09-29 DIAGNOSIS — Z8249 Family history of ischemic heart disease and other diseases of the circulatory system: Secondary | ICD-10-CM

## 2021-09-29 DIAGNOSIS — Z79899 Other long term (current) drug therapy: Secondary | ICD-10-CM

## 2021-09-29 DIAGNOSIS — Z87891 Personal history of nicotine dependence: Secondary | ICD-10-CM

## 2021-09-29 DIAGNOSIS — Z7982 Long term (current) use of aspirin: Secondary | ICD-10-CM

## 2021-09-29 DIAGNOSIS — Z951 Presence of aortocoronary bypass graft: Secondary | ICD-10-CM

## 2021-09-29 DIAGNOSIS — Z781 Physical restraint status: Secondary | ICD-10-CM

## 2021-09-29 DIAGNOSIS — Z953 Presence of xenogenic heart valve: Secondary | ICD-10-CM

## 2021-09-29 DIAGNOSIS — I10 Essential (primary) hypertension: Secondary | ICD-10-CM | POA: Diagnosis present

## 2021-09-29 DIAGNOSIS — N179 Acute kidney failure, unspecified: Secondary | ICD-10-CM

## 2021-09-29 DIAGNOSIS — H9190 Unspecified hearing loss, unspecified ear: Secondary | ICD-10-CM | POA: Diagnosis present

## 2021-09-29 DIAGNOSIS — A4189 Other specified sepsis: Principal | ICD-10-CM | POA: Diagnosis present

## 2021-09-29 DIAGNOSIS — E785 Hyperlipidemia, unspecified: Secondary | ICD-10-CM | POA: Diagnosis present

## 2021-09-29 DIAGNOSIS — I130 Hypertensive heart and chronic kidney disease with heart failure and stage 1 through stage 4 chronic kidney disease, or unspecified chronic kidney disease: Secondary | ICD-10-CM | POA: Diagnosis present

## 2021-09-29 DIAGNOSIS — Z885 Allergy status to narcotic agent status: Secondary | ICD-10-CM

## 2021-09-29 DIAGNOSIS — Z955 Presence of coronary angioplasty implant and graft: Secondary | ICD-10-CM

## 2021-09-29 DIAGNOSIS — I2510 Atherosclerotic heart disease of native coronary artery without angina pectoris: Secondary | ICD-10-CM | POA: Diagnosis present

## 2021-09-29 DIAGNOSIS — Z8673 Personal history of transient ischemic attack (TIA), and cerebral infarction without residual deficits: Secondary | ICD-10-CM

## 2021-09-29 DIAGNOSIS — J9601 Acute respiratory failure with hypoxia: Secondary | ICD-10-CM | POA: Diagnosis present

## 2021-09-29 DIAGNOSIS — A419 Sepsis, unspecified organism: Secondary | ICD-10-CM

## 2021-09-29 DIAGNOSIS — Z8042 Family history of malignant neoplasm of prostate: Secondary | ICD-10-CM

## 2021-09-29 DIAGNOSIS — N189 Chronic kidney disease, unspecified: Secondary | ICD-10-CM

## 2021-09-29 DIAGNOSIS — K7200 Acute and subacute hepatic failure without coma: Secondary | ICD-10-CM | POA: Diagnosis not present

## 2021-09-29 DIAGNOSIS — R9431 Abnormal electrocardiogram [ECG] [EKG]: Secondary | ICD-10-CM

## 2021-09-29 DIAGNOSIS — N390 Urinary tract infection, site not specified: Secondary | ICD-10-CM | POA: Diagnosis not present

## 2021-09-29 DIAGNOSIS — J189 Pneumonia, unspecified organism: Secondary | ICD-10-CM

## 2021-09-29 DIAGNOSIS — F10139 Alcohol abuse with withdrawal, unspecified: Secondary | ICD-10-CM | POA: Diagnosis not present

## 2021-09-29 DIAGNOSIS — I252 Old myocardial infarction: Secondary | ICD-10-CM

## 2021-09-29 DIAGNOSIS — R4182 Altered mental status, unspecified: Secondary | ICD-10-CM | POA: Diagnosis not present

## 2021-09-29 DIAGNOSIS — I214 Non-ST elevation (NSTEMI) myocardial infarction: Secondary | ICD-10-CM

## 2021-09-29 DIAGNOSIS — Z515 Encounter for palliative care: Secondary | ICD-10-CM

## 2021-09-29 DIAGNOSIS — Z800 Family history of malignant neoplasm of digestive organs: Secondary | ICD-10-CM

## 2021-09-29 DIAGNOSIS — G928 Other toxic encephalopathy: Secondary | ICD-10-CM | POA: Diagnosis not present

## 2021-09-29 DIAGNOSIS — B952 Enterococcus as the cause of diseases classified elsewhere: Secondary | ICD-10-CM | POA: Diagnosis not present

## 2021-09-29 DIAGNOSIS — N400 Enlarged prostate without lower urinary tract symptoms: Secondary | ICD-10-CM | POA: Diagnosis present

## 2021-09-29 DIAGNOSIS — F05 Delirium due to known physiological condition: Secondary | ICD-10-CM | POA: Diagnosis present

## 2021-09-29 DIAGNOSIS — Z952 Presence of prosthetic heart valve: Secondary | ICD-10-CM

## 2021-09-29 DIAGNOSIS — Z66 Do not resuscitate: Secondary | ICD-10-CM | POA: Diagnosis present

## 2021-09-29 DIAGNOSIS — Z880 Allergy status to penicillin: Secondary | ICD-10-CM

## 2021-09-29 DIAGNOSIS — J159 Unspecified bacterial pneumonia: Secondary | ICD-10-CM | POA: Diagnosis present

## 2021-09-29 DIAGNOSIS — G9341 Metabolic encephalopathy: Secondary | ICD-10-CM | POA: Diagnosis not present

## 2021-09-29 DIAGNOSIS — J1282 Pneumonia due to coronavirus disease 2019: Secondary | ICD-10-CM | POA: Diagnosis present

## 2021-09-29 DIAGNOSIS — E039 Hypothyroidism, unspecified: Secondary | ICD-10-CM | POA: Diagnosis present

## 2021-09-29 DIAGNOSIS — U071 COVID-19: Secondary | ICD-10-CM | POA: Diagnosis present

## 2021-09-29 LAB — URINALYSIS, ROUTINE W REFLEX MICROSCOPIC
Bacteria, UA: NONE SEEN
Bilirubin Urine: NEGATIVE
Glucose, UA: NEGATIVE mg/dL
Ketones, ur: NEGATIVE mg/dL
Leukocytes,Ua: NEGATIVE
Nitrite: NEGATIVE
Protein, ur: NEGATIVE mg/dL
RBC / HPF: >50 RBC/hpf — ABNORMAL HIGH (ref 0–5)
Specific Gravity, Urine: 1.009 (ref 1.005–1.030)
pH: 5 (ref 5.0–8.0)

## 2021-09-29 LAB — COMPREHENSIVE METABOLIC PANEL
ALT: 22 U/L (ref 0–44)
AST: 57 U/L — ABNORMAL HIGH (ref 15–41)
Albumin: 3.4 g/dL — ABNORMAL LOW (ref 3.5–5.0)
Alkaline Phosphatase: 83 U/L (ref 38–126)
Anion gap: 13 (ref 5–15)
BUN: 53 mg/dL — ABNORMAL HIGH (ref 8–23)
CO2: 24 mmol/L (ref 22–32)
Calcium: 8.4 mg/dL — ABNORMAL LOW (ref 8.9–10.3)
Chloride: 104 mmol/L (ref 98–111)
Creatinine, Ser: 2.23 mg/dL — ABNORMAL HIGH (ref 0.61–1.24)
GFR, Estimated: 26 mL/min — ABNORMAL LOW (ref >60)
Glucose, Bld: 143 mg/dL — ABNORMAL HIGH (ref 70–99)
Potassium: 4.4 mmol/L (ref 3.5–5.1)
Sodium: 141 mmol/L (ref 135–145)
Total Bilirubin: 0.8 mg/dL (ref 0.3–1.2)
Total Protein: 7 g/dL (ref 6.5–8.1)

## 2021-09-29 LAB — CBC WITH DIFFERENTIAL/PLATELET
Abs Immature Granulocytes: 0.05 10*3/uL (ref 0.00–0.07)
Basophils Absolute: 0 10*3/uL (ref 0.0–0.1)
Basophils Relative: 0 %
Eosinophils Absolute: 0 10*3/uL (ref 0.0–0.5)
Eosinophils Relative: 0 %
HCT: 51.7 % (ref 39.0–52.0)
Hemoglobin: 16.7 g/dL (ref 13.0–17.0)
Immature Granulocytes: 1 %
Lymphocytes Relative: 6 %
Lymphs Abs: 0.4 10*3/uL — ABNORMAL LOW (ref 0.7–4.0)
MCH: 32 pg (ref 26.0–34.0)
MCHC: 32.3 g/dL (ref 30.0–36.0)
MCV: 99 fL (ref 80.0–100.0)
Monocytes Absolute: 0.5 10*3/uL (ref 0.1–1.0)
Monocytes Relative: 7 %
Neutro Abs: 5.3 10*3/uL (ref 1.7–7.7)
Neutrophils Relative %: 86 %
Platelets: 124 10*3/uL — ABNORMAL LOW (ref 150–400)
RBC: 5.22 MIL/uL (ref 4.22–5.81)
RDW: 13.9 % (ref 11.5–15.5)
WBC: 6.2 10*3/uL (ref 4.0–10.5)
nRBC: 0 % (ref 0.0–0.2)

## 2021-09-29 LAB — LACTIC ACID, PLASMA
Lactic Acid, Venous: 2.5 mmol/L (ref 0.5–1.9)
Lactic Acid, Venous: 2.4 mmol/L (ref 0.5–1.9)

## 2021-09-29 LAB — APTT: aPTT: 31 seconds (ref 24–36)

## 2021-09-29 LAB — TROPONIN I (HIGH SENSITIVITY)
Troponin I (High Sensitivity): 340 ng/L (ref <18)
Troponin I (High Sensitivity): 327 ng/L (ref <18)

## 2021-09-29 LAB — PROTIME-INR
INR: 1 (ref 0.8–1.2)
Prothrombin Time: 13.5 seconds (ref 11.4–15.2)

## 2021-09-29 LAB — BRAIN NATRIURETIC PEPTIDE: B Natriuretic Peptide: 278.3 pg/mL — ABNORMAL HIGH (ref 0.0–100.0)

## 2021-09-29 MED ORDER — LACTATED RINGERS IV BOLUS (SEPSIS)
1000.0000 mL | Freq: Once | INTRAVENOUS | Status: DC
Start: 2021-09-29 — End: 2021-09-29
  Administered 2021-09-29: 1000 mL via INTRAVENOUS

## 2021-09-29 MED ORDER — SODIUM CHLORIDE 0.9 % IV SOLN
2.0000 g | Freq: Once | INTRAVENOUS | Status: AC
  Administered 2021-09-29: 2 g via INTRAVENOUS
  Filled 2021-09-29: qty 2

## 2021-09-29 MED ORDER — VANCOMYCIN HCL IN DEXTROSE 1-5 GM/200ML-% IV SOLN
1000.0000 mg | Freq: Once | INTRAVENOUS | Status: AC
Start: 2021-09-29 — End: 2021-09-29
  Administered 2021-09-29: 1000 mg via INTRAVENOUS
  Filled 2021-09-29: qty 200

## 2021-09-29 MED ORDER — ACETAMINOPHEN 325 MG PO TABS
650.0000 mg | ORAL_TABLET | Freq: Once | ORAL | Status: AC
  Administered 2021-09-29: 650 mg via ORAL
  Filled 2021-09-29: qty 2

## 2021-09-29 MED ORDER — LACTATED RINGERS IV SOLN: INTRAVENOUS | Status: DC

## 2021-09-29 MED ORDER — ACETAMINOPHEN 325 MG PO TABS
325.0000 mg | ORAL_TABLET | Freq: Once | ORAL | Status: AC
  Administered 2021-09-30: 325 mg via ORAL
  Filled 2021-09-29: qty 1

## 2021-09-29 MED ORDER — IBUPROFEN 200 MG PO TABS
400.0000 mg | ORAL_TABLET | Freq: Once | ORAL | Status: AC
  Administered 2021-09-30: 400 mg via ORAL
  Filled 2021-09-29: qty 2

## 2021-09-29 MED ORDER — METRONIDAZOLE 500 MG/100ML IV SOLN
500.0000 mg | Freq: Once | INTRAVENOUS | Status: AC
  Administered 2021-09-29: 500 mg via INTRAVENOUS
  Filled 2021-09-29: qty 100

## 2021-09-29 NOTE — ED Provider Notes (Signed)
88 year old male presents today with generalized weakness and fever.  Son is the primary historian.  The patient lives independently but lives across the street from his son.  His son is noted that he has become weaker this week and somewhat confused. ?On evaluation here in the emergency department he has a temp of 102. ?The patient is awake and alert and is able to tell me his name, date of birth, and where he is. ?He denies any headache, cough, dyspnea, nausea or vomiting.  He endorses some increased frequency of urination ?Patient is evaluated here with imaging, labs, blood cultures ?Patient is treated here with IV fluids and broad-spectrum antibiotics. ?Initial urinalysis is significant for greater than 50 red blood cells. ?Patient has some mild diffuse tenderness to palpation of his abdomen ?CT of his abdomen pelvis are added in addition to the original work-up ?Chest x-Niraj Kudrna reviewed and without evidence of acute abnormality ?COVID screening, flu a and flu back are added. ? ?  ?Pattricia Boss, MD ?10/15/2021 2241 ? ?

## 2021-09-29 NOTE — ED Triage Notes (Signed)
BIB EMS from home family noticed increased lethargy for last few days usually ambulates but unable to ambulate at home, normally on RA was 89% RA placed on 4 LNC ?

## 2021-09-29 NOTE — ED Provider Notes (Signed)
Patient signed out to me at shift change.  ? ?He presents with confusion, fever, hypoxia and is thought to be septic.  Source unknown at this point in the workup.   ? ?Has hx of CHF, will hold weight based fluids as he is not hypotensive and lactic is still pending. ? ?Has been urinating more frequently and reports some lower abdominal discomfort.  Question UTI, but will check CT a/p along with CT head 2/2 confusion. ? ?Patient seen by and discussed with Dr. Jeanell Sparrow. ? ?ED Course / MDM  ? ?Clinical Course as of 09/30/21 0113  ?Fri Sep 29, 2021  ?2125 Consult to pharmacist, Abby, who states it is safe to proceed with cefepime at this time.  I appreciate her collaboration of care of this patient. [RS]  ?2125 Per RN, rectal temp 102 F. Tylenol ordered. [RS]  ?2351 I had discussion with son, who states that father doesn't want any prolonged heroic measures such as CPR or intubation.  I told him that we will continue with IV fluids and medications. [RB]  ?Sat Sep 30, 2021  ?0108 I consulted with Dr. Tonie Griffith, who is appreciated for admitting the patient.  Uncertain explanation for the patient's elevated troponin.  I will call and consult with cardiology and request that they see the patient in the morning. [RB]  ?[place] with Dr. Humphrey Rolls, from cardiology.  Will have patient seen by cardiology team in the morning.  NO heparin. [RB]  ?0113 CT chest ordered per Dr. Tonie Griffith recs. [RB]  ?  ?Clinical Course User Index ?[RB] Montine Circle, PA-C ?[RS] Sponseller, Gypsy Balsam, PA-C  ? ?Medical Decision Making ?Lactic elevated, but less than 4.0.  Will hold off on weight based fluids due to CHF and mild hypoxemia. ? ?Trops elevated at 528U, uncertain etiology, patient denies chest pain.  Cardiology to see patient in AM, recommends NO heparin. ? ?High complexity. ? ?Amount and/or Complexity of Data Reviewed ?Labs: ordered. ?Radiology: ordered. ?ECG/medicine tests: ordered. ? ?Risk ?OTC drugs. ?Prescription drug  management. ?Decision regarding hospitalization. ? ? ? ? ? ? ? ?  ?Montine Circle, PA-C ?09/30/21 0116 ? ?  ?Pattricia Boss, MD ?10/01/21 2022 ? ?

## 2021-09-29 NOTE — ED Provider Notes (Signed)
[facility] DEPT Provider Note   CSN: [id number] Arrival date & time: 10/19/2021  2047     History  No chief complaint on file.   [name] [name] is a 88 y.o. male who presents with his son at the bedside with concern for altered mental over the last week.  Patient is 88 years old and lives on his own, ambulatory at baseline.  Unfortunately over the last week his family states he has become gradually more confused and is a longer able to walk on his own.  Has been coughing according to his son though he does cough regularly at baseline.  Per family no known fevers.  Level 5 caveat due to patient's altered mental status upon arrival.  I have personally reviewed this patient's medical records.  He has history of CAD, TAVR, CHF, CVA without residual deficits, AAA with endovascular repair in 2004. Ejection fraction in 2018 was 50 to 55% on echocardiogram.Patient is not anticoagulated.  HPI     Home Medications Prior to Admission medications   Medication Sig Start Date End Date Taking? Authorizing Provider  acetaminophen (TYLENOL) 500 MG tablet Take 1,000 mg by mouth every 4 (four) hours as needed.    [provider]  aspirin 81 MG tablet Take 81 mg by mouth daily.    [provider]  B Complex-C (SUPER B COMPLEX) TABS Take 1 tablet by mouth daily.    [provider]  clindamycin (CLEOCIN) 150 MG capsule TK 4 CS PO PRIOR TO DENTAL APPOINTMENT 02/09/19   Kathyrn Drown D, NP  finasteride (PROSCAR) 5 MG tablet Take 1 tablet (5 mg total) by mouth daily. 05/18/21   Richardson Dopp T, PA-C  ipratropium (ATROVENT) 0.03 % nasal spray Place 2 sprays into both nostrils as needed. 05/04/19   [provider]  levothyroxine (SYNTHROID) 50 MCG tablet Take 1 tablet (50 mcg total) by mouth daily. 05/18/21   Richardson Dopp T, PA-C  metoprolol tartrate (LOPRESSOR) 25 MG tablet Take 1 tablet (25 mg total) by mouth 2 (two) times daily. 05/18/21    Richardson Dopp T, PA-C  Multiple Vitamins-Minerals (PRESERVISION AREDS 2 PO) Take 1 capsule by mouth 2 (two) times daily.     [provider]  PARoxetine (PAXIL) 20 MG tablet Take 0.5 tablets (10 mg total) by mouth every morning. 05/22/21 05/22/22  Richardson Dopp T, PA-C  potassium chloride (MICRO-K) 10 MEQ CR capsule Take 1 capsule (10 mEq total) by mouth daily. 05/18/21   Richardson Dopp T, PA-C  torsemide (DEMADEX) 20 MG tablet Take 1 tablet (20 mg total) by mouth 2 (two) times daily. 08/30/21 08/30/22  Richardson Dopp T, PA-C      Allergies    Lasix [furosemide], Mucinex [guaifenesin er], Amoxicillin-pot clavulanate, Morphine and related, and Penicillins    Review of Systems   Review of Systems  Unable to perform ROS: Mental status change  Constitutional:  Positive for fever.  Respiratory:  Positive for cough.   Psychiatric/Behavioral:  Positive for confusion.    Physical Exam Updated Vital Signs BP (!) 149/61    Pulse 81    Temp (!) 102.1 F (38.9 C) (Rectal)    Resp 15    Ht '5\' 6"'$  (1.676 m)    SpO2 93%    BMI 30.63 kg/m  Physical Exam Vitals and nursing note reviewed.  Constitutional:      Appearance: He is ill-appearing. He is not toxic-appearing.     Interventions: Nasal cannula in place.  Comments: Febrile to 102 F rectally on intake.  HENT:     Head: Normocephalic and atraumatic.     Nose: Nose normal.     Mouth/Throat:     Mouth: Mucous membranes are dry.     Pharynx: Oropharynx is clear. Uvula midline. No oropharyngeal exudate or posterior oropharyngeal erythema.     Tonsils: No tonsillar exudate.  Eyes:     General: Lids are normal. Vision grossly intact.        Right eye: No discharge.        Left eye: No discharge.     Extraocular Movements: Extraocular movements intact.     Conjunctiva/sclera: Conjunctivae normal.     Pupils: Pupils are equal, round, and reactive to light.  Neck:     Trachea: Trachea normal.  Cardiovascular:     Rate and Rhythm: Normal  rate and regular rhythm.     Pulses: Normal pulses.     Heart sounds: Normal heart sounds.  Pulmonary:     Effort: Pulmonary effort is normal. No tachypnea, bradypnea, accessory muscle usage, prolonged expiration or respiratory distress.     Breath sounds: Examination of the right-lower field reveals rales. Examination of the left-lower field reveals rales. Rales present. No wheezing.     Comments: Hypoxic on intake, requiring 3L for maintenance of O2 sat over 90%.  Chest:     Chest wall: No mass, lacerations, deformity, swelling, tenderness or crepitus.  Abdominal:     General: Bowel sounds are normal. There is no distension.     Palpations: Abdomen is soft.     Tenderness: There is no abdominal tenderness. There is no guarding.     Comments: Abdominal exam limited secondary to patient's altered mental status.   Musculoskeletal:        General: No deformity.     Cervical back: Normal range of motion and neck supple.     Right lower leg: No edema.     Left lower leg: No edema.  Lymphadenopathy:     Cervical: No cervical adenopathy.  Skin:    General: Skin is warm and dry.     Capillary Refill: Capillary refill takes less than 2 seconds.  Neurological:     General: No focal deficit present.     Mental Status: He is easily aroused. He is lethargic, disoriented and confused.     GCS: GCS eye subscore is 3. GCS verbal subscore is 4. GCS motor subscore is 5.     Cranial Nerves: Cranial nerves 2-12 are intact.     Sensory: Sensation is intact.     Comments: Patient not answering any orientation questions verbally for this provider.  Psychiatric:        Mood and Affect: Mood normal.    ED Results / Procedures / Treatments   Labs (all labs ordered are listed, but only abnormal results are displayed) Labs Reviewed  CBC WITH DIFFERENTIAL/PLATELET - Abnormal; Notable for the following components:      Result Value   Platelets 124 (*)    Lymphs Abs 0.4 (*)    All other components  within normal limits  URINALYSIS, ROUTINE W REFLEX MICROSCOPIC - Abnormal; Notable for the following components:   APPearance HAZY (*)    Hgb urine dipstick MODERATE (*)    RBC / HPF >50 (*)    All other components within normal limits  BRAIN NATRIURETIC PEPTIDE - Abnormal; Notable for the following components:   B Natriuretic Peptide 278.3 (*)  All other components within normal limits  CULTURE, BLOOD (ROUTINE X 2)  CULTURE, BLOOD (ROUTINE X 2)  URINE CULTURE  PROTIME-INR  APTT  LACTIC ACID, PLASMA  LACTIC ACID, PLASMA  COMPREHENSIVE METABOLIC PANEL  TROPONIN I (HIGH SENSITIVITY)    EKG EKG Interpretation  Date/Time:  Friday September 29 2021 21:01:14 EST Ventricular Rate:  84 PR Interval:  196 QRS Duration: 118 QT Interval:  407 QTC Calculation: 482 R Axis:   24 Text Interpretation: Sinus rhythm Probable left atrial enlargement Non-specific ST-t changes Confirmed by Pattricia Boss [phone]) on 09/22/2021 9:19:58 PM  Radiology DG Chest Port 1 View  Result Date: 09/28/2021 CLINICAL DATA:  Questionable sepsis. EXAM: PORTABLE CHEST 1 VIEW COMPARISON:  Chest radiograph dated 08/01/2017. FINDINGS: Minimal bibasilar atelectasis. No focal consolidation, pleural effusion, pneumothorax. Top-normal cardiac size. Median sternotomy wires and cardiac valve repair. Atherosclerotic calcification of the aorta. No acute osseous pathology. IMPRESSION: No active cardiopulmonary disease. Electronically Signed   By: Anner Crete M.D.   On: 10/03/2021 21:55    Procedures Procedures    Medications Ordered in ED Medications  metroNIDAZOLE (FLAGYL) IVPB 500 mg (500 mg Intravenous New Bag/Given 10/14/2021 2207)  vancomycin (VANCOCIN) IVPB 1000 mg/200 mL premix (1,000 mg Intravenous New Bag/Given 09/24/2021 2209)  acetaminophen (TYLENOL) tablet 650 mg (650 mg Oral Given 09/23/2021 2157)  ceFEPIme (MAXIPIME) 2 g in sodium chloride 0.9 % 100 mL IVPB (2 g Intravenous New Bag/Given 10/13/2021 2157)    ED  Course/ Medical Decision Making/ A&P Clinical Course as of 10/11/2021 2228  Fri Sep 29, 2021  2125 Consult to pharmacist, Abby, who states it is safe to proceed with cefepime at this time.  I appreciate her collaboration of care of this patient. [RS]  2125 Per RN, rectal temp 102 F. Tylenol ordered. [RS]    Clinical Course User Index [RS] Lile Mccurley, Gypsy Balsam, PA-C                           Medical Decision Making 88 year old male who presents with concern for altered mental status over the last week.  Febrile and hypoxic on intake, vital signs otherwise normal.  On 2 L submental ectomy nasal cannula at time my arrival to room.  Cardiac exam is unremarkable, pulmonary exam with rales in the bilateral bases.  Patient maintaining oxygen saturation of 89 to 90% on 2 L, increased to 3 L by this provider.  Abdominal exam limited by patient's altered mental status but not obviously tender to palpation at time of my exam.  No significant lower extremity edema.  No rashes or wounds visible on the patient's body.  Code sepsis activated given concern for febrile confusion in this elderly patient.  Broad-spectrum antibiotics started.  Amount and/or Complexity of Data Reviewed Labs: ordered.    Details: CBC without leukocytosis or anemia at time of shift change.  All other labs pending at this time. Radiology: ordered. ECG/medicine tests: ordered.  Risk OTC drugs. Prescription drug management.   Care of this patient signed out to oncoming ED provider Lorre Munroe, PA-C at time of shift change.  All pertinent HPI, physical exam, and laboratory findings were discussed with him prior to my departure.  Patient evaluated at the bedside by attending physician, Dr. Jeanell Sparrow.  It appears that he has improved in his mental status since administration of Tylenol and apparent improvement in his fever.  He is now answering orientation question and appears tender in his abdomen.  Oncoming  ED provider to order CT  imaging.  This chart was dictated using voice recognition software, Dragon. Despite the best efforts of this provider to proofread and correct errors, errors may still occur which can change documentation meaning.  Final Clinical Impression(s) / ED Diagnoses Final diagnoses:  None    Rx / DC Orders ED Discharge Orders     None         Aura Dials 09/30/2021 2228    Pattricia Boss, MD 10/01/21 2022

## 2021-09-29 NOTE — Telephone Encounter (Signed)
Called and spoke to son who states dad "has had a bad week." In the last week is is sleeping almost non-stop. Son is checking on him half a dozen times during the day and states he can tell that the patient is just sitting in his chair in the den. States he is checking on him each evening and sometimes in the morning. Son states his appetite seems to have decreased, but cannot fully tell if its due to him sleeping so much that he doesn't eat, or if he's truly not hungry. Son states today he fixed him lunch with 2 eggs and toast which pt ate, but shortly fell back asleep. States he will wake up to go to bathroom, but otherwise just sleeps in his recliner.  Son feels he is "a little more shaky than normal", states "Trying to engage him in conversation and before long he will just go off back to sleep" Condones that his dad spends more time asleep than awake and does very little of anything. Does note that when dad is asleep, his breathing seems to be loud. Feels like his dad is "just not engaged" and seems to be declining. Unsure of recent vitals. States his dad can get up and walk around and he checked his grip strength on both sides of his body and it's equal-he's just worried about him. Advised that son monitor his BP to ensure that it's not dropping too low causing the tiredness. Also, to ensure that he's eating regularly to rule out hypoglycemia and check his urine if at all possible to make sure it's not malodorous or extremely dark in color (signs of UTI). Son did verify that pt has not increased his Torsemide to BID dosing, but was continuing at 1.5 tablets daily-states he will correct instructions on bottle at home and make sure dad begins taking twice daily.  ?Son condones that he will get a BP cuff/replace batteries in the current one at his dad's house and begin to monitor BP. Advised that if he feels the patients condition is deteriorating, he should take him somewhere to be evaluated and get possible  labs to check his electrolytes. Son states he does not have a PCP anymore, but there is a clinic in [place] (similar to [facility]) that he takes him to if he needs to. Son agrees with plan. ?Will route to [employer] for review and any potential recommendations. ? ?

## 2021-09-29 NOTE — Telephone Encounter (Signed)
Son of the patient called. The Son said that the patient has very little strength and sleeps a lot. He looks like there are times when he does have some labored breathing. He is still able to walk around with a cane. The son states the patient still lives on his own but the Son checks in on him regularly. ? ?He is not sure if there is anything he needs to do for him  ?

## 2021-09-29 NOTE — ED Notes (Signed)
Patient's oxygen level dropped, went into room and oxygen was not on patient he keeps taking it off, replaced and oxygen level increased to 95% ?

## 2021-09-29 NOTE — Sepsis Progress Note (Signed)
Following for sepsis monitoring ?

## 2021-09-29 NOTE — Progress Notes (Signed)
PHARMACY -  BRIEF ANTIBIOTIC NOTE  ? ?Pharmacy has received consult(s) for vancomycin and cefepime from an ED provider.  The patient's profile has been reviewed for ht/wt/allergies/indication/available labs.   ? ?One time order(s) placed for vancomycin 1 g + cefepime 2 g ? ?Further antibiotics/pharmacy consults should be ordered by admitting physician if indicated.       ?                ?Thank you, ? ?Tawnya Crook, PharmD, BCPS ?Clinical Pharmacist ?09/23/2021 9:27 PM ? ? ?

## 2021-09-30 ENCOUNTER — Inpatient Hospital Stay (HOSPITAL_COMMUNITY): Payer: Medicare Other

## 2021-09-30 ENCOUNTER — Encounter (HOSPITAL_COMMUNITY): Payer: Self-pay | Admitting: Family Medicine

## 2021-09-30 DIAGNOSIS — J159 Unspecified bacterial pneumonia: Secondary | ICD-10-CM | POA: Diagnosis present

## 2021-09-30 DIAGNOSIS — Z79890 Hormone replacement therapy (postmenopausal): Secondary | ICD-10-CM | POA: Diagnosis not present

## 2021-09-30 DIAGNOSIS — N390 Urinary tract infection, site not specified: Secondary | ICD-10-CM | POA: Diagnosis not present

## 2021-09-30 DIAGNOSIS — R6520 Severe sepsis without septic shock: Secondary | ICD-10-CM | POA: Diagnosis not present

## 2021-09-30 DIAGNOSIS — N179 Acute kidney failure, unspecified: Secondary | ICD-10-CM

## 2021-09-30 DIAGNOSIS — I5032 Chronic diastolic (congestive) heart failure: Secondary | ICD-10-CM

## 2021-09-30 DIAGNOSIS — F10139 Alcohol abuse with withdrawal, unspecified: Secondary | ICD-10-CM | POA: Diagnosis not present

## 2021-09-30 DIAGNOSIS — J9601 Acute respiratory failure with hypoxia: Secondary | ICD-10-CM | POA: Diagnosis present

## 2021-09-30 DIAGNOSIS — U071 COVID-19: Secondary | ICD-10-CM | POA: Diagnosis present

## 2021-09-30 DIAGNOSIS — Z515 Encounter for palliative care: Secondary | ICD-10-CM | POA: Diagnosis not present

## 2021-09-30 DIAGNOSIS — E039 Hypothyroidism, unspecified: Secondary | ICD-10-CM | POA: Diagnosis present

## 2021-09-30 DIAGNOSIS — A4189 Other specified sepsis: Secondary | ICD-10-CM | POA: Diagnosis present

## 2021-09-30 DIAGNOSIS — R9431 Abnormal electrocardiogram [ECG] [EKG]: Secondary | ICD-10-CM

## 2021-09-30 DIAGNOSIS — N189 Chronic kidney disease, unspecified: Secondary | ICD-10-CM | POA: Diagnosis not present

## 2021-09-30 DIAGNOSIS — I214 Non-ST elevation (NSTEMI) myocardial infarction: Secondary | ICD-10-CM

## 2021-09-30 DIAGNOSIS — I130 Hypertensive heart and chronic kidney disease with heart failure and stage 1 through stage 4 chronic kidney disease, or unspecified chronic kidney disease: Secondary | ICD-10-CM | POA: Diagnosis present

## 2021-09-30 DIAGNOSIS — G9340 Encephalopathy, unspecified: Secondary | ICD-10-CM

## 2021-09-30 DIAGNOSIS — Z953 Presence of xenogenic heart valve: Secondary | ICD-10-CM | POA: Diagnosis not present

## 2021-09-30 DIAGNOSIS — Z66 Do not resuscitate: Secondary | ICD-10-CM | POA: Diagnosis present

## 2021-09-30 DIAGNOSIS — N1832 Chronic kidney disease, stage 3b: Secondary | ICD-10-CM | POA: Diagnosis present

## 2021-09-30 DIAGNOSIS — G9341 Metabolic encephalopathy: Secondary | ICD-10-CM | POA: Diagnosis not present

## 2021-09-30 DIAGNOSIS — F05 Delirium due to known physiological condition: Secondary | ICD-10-CM | POA: Diagnosis present

## 2021-09-30 DIAGNOSIS — Z7189 Other specified counseling: Secondary | ICD-10-CM | POA: Diagnosis not present

## 2021-09-30 DIAGNOSIS — K7200 Acute and subacute hepatic failure without coma: Secondary | ICD-10-CM | POA: Diagnosis not present

## 2021-09-30 DIAGNOSIS — I2510 Atherosclerotic heart disease of native coronary artery without angina pectoris: Secondary | ICD-10-CM | POA: Diagnosis present

## 2021-09-30 DIAGNOSIS — A419 Sepsis, unspecified organism: Secondary | ICD-10-CM

## 2021-09-30 DIAGNOSIS — Z7982 Long term (current) use of aspirin: Secondary | ICD-10-CM | POA: Diagnosis not present

## 2021-09-30 DIAGNOSIS — R4182 Altered mental status, unspecified: Secondary | ICD-10-CM | POA: Diagnosis present

## 2021-09-30 DIAGNOSIS — Z8673 Personal history of transient ischemic attack (TIA), and cerebral infarction without residual deficits: Secondary | ICD-10-CM | POA: Diagnosis not present

## 2021-09-30 DIAGNOSIS — G928 Other toxic encephalopathy: Secondary | ICD-10-CM | POA: Diagnosis not present

## 2021-09-30 DIAGNOSIS — J1282 Pneumonia due to coronavirus disease 2019: Secondary | ICD-10-CM | POA: Diagnosis present

## 2021-09-30 LAB — COMPREHENSIVE METABOLIC PANEL
ALT: 20 U/L (ref 0–44)
AST: 50 U/L — ABNORMAL HIGH (ref 15–41)
Albumin: 2.8 g/dL — ABNORMAL LOW (ref 3.5–5.0)
Alkaline Phosphatase: 65 U/L (ref 38–126)
Anion gap: 11 (ref 5–15)
BUN: 57 mg/dL — ABNORMAL HIGH (ref 8–23)
CO2: 25 mmol/L (ref 22–32)
Calcium: 7.9 mg/dL — ABNORMAL LOW (ref 8.9–10.3)
Chloride: 105 mmol/L (ref 98–111)
Creatinine, Ser: 2.52 mg/dL — ABNORMAL HIGH (ref 0.61–1.24)
GFR, Estimated: 23 mL/min — ABNORMAL LOW (ref >60)
Glucose, Bld: 133 mg/dL — ABNORMAL HIGH (ref 70–99)
Potassium: 4.1 mmol/L (ref 3.5–5.1)
Sodium: 141 mmol/L (ref 135–145)
Total Bilirubin: 0.8 mg/dL (ref 0.3–1.2)
Total Protein: 5.8 g/dL — ABNORMAL LOW (ref 6.5–8.1)

## 2021-09-30 LAB — CBC
HCT: 45.6 % (ref 39.0–52.0)
Hemoglobin: 14.8 g/dL (ref 13.0–17.0)
MCH: 32.5 pg (ref 26.0–34.0)
MCHC: 32.5 g/dL (ref 30.0–36.0)
MCV: 100 fL (ref 80.0–100.0)
Platelets: 114 10*3/uL — ABNORMAL LOW (ref 150–400)
RBC: 4.56 MIL/uL (ref 4.22–5.81)
RDW: 13.9 % (ref 11.5–15.5)
WBC: 5.4 10*3/uL (ref 4.0–10.5)
nRBC: 0 % (ref 0.0–0.2)

## 2021-09-30 LAB — LACTIC ACID, PLASMA
Lactic Acid, Venous: 1.5 mmol/L (ref 0.5–1.9)
Lactic Acid, Venous: 1.2 mmol/L (ref 0.5–1.9)

## 2021-09-30 LAB — MRSA NEXT GEN BY PCR, NASAL: MRSA by PCR Next Gen: NOT DETECTED

## 2021-09-30 LAB — RESP PANEL BY RT-PCR (FLU A&B, COVID) ARPGX2
Influenza A by PCR: NEGATIVE
Influenza B by PCR: NEGATIVE
SARS Coronavirus 2 by RT PCR: POSITIVE — AB

## 2021-09-30 MED ORDER — METOPROLOL TARTRATE 25 MG PO TABS
25.0000 mg | ORAL_TABLET | Freq: Two times a day (BID) | ORAL | Status: DC
  Administered 2021-09-30 – 2021-10-02 (×6): 25 mg via ORAL
  Filled 2021-09-30 (×7): qty 1

## 2021-09-30 MED ORDER — TORSEMIDE 20 MG PO TABS
20.0000 mg | ORAL_TABLET | Freq: Two times a day (BID) | ORAL | Status: DC
  Administered 2021-09-30 – 2021-10-02 (×5): 20 mg via ORAL
  Filled 2021-09-30 (×6): qty 1

## 2021-09-30 MED ORDER — LEVOTHYROXINE SODIUM 50 MCG PO TABS
50.0000 ug | ORAL_TABLET | Freq: Every day | ORAL | Status: DC
  Administered 2021-09-30 – 2021-10-03 (×4): 50 ug via ORAL
  Filled 2021-09-30 (×4): qty 1

## 2021-09-30 MED ORDER — ACETAMINOPHEN 650 MG RE SUPP: 650.0000 mg | Freq: Four times a day (QID) | RECTAL | Status: DC | PRN

## 2021-09-30 MED ORDER — ASPIRIN 81 MG PO CHEW
81.0000 mg | CHEWABLE_TABLET | Freq: Every day | ORAL | Status: DC
  Administered 2021-09-30 – 2021-10-02 (×3): 81 mg via ORAL
  Filled 2021-09-30 (×4): qty 1

## 2021-09-30 MED ORDER — FINASTERIDE 5 MG PO TABS
5.0000 mg | ORAL_TABLET | Freq: Every day | ORAL | Status: DC
  Administered 2021-09-30 – 2021-10-03 (×4): 5 mg via ORAL
  Filled 2021-09-30 (×4): qty 1

## 2021-09-30 MED ORDER — HEPARIN SODIUM (PORCINE) 5000 UNIT/ML IJ SOLN
5000.0000 [IU] | Freq: Three times a day (TID) | INTRAMUSCULAR | Status: DC
  Administered 2021-09-30 – 2021-10-04 (×14): 5000 [IU] via SUBCUTANEOUS
  Filled 2021-09-30 (×14): qty 1

## 2021-09-30 MED ORDER — VANCOMYCIN HCL 750 MG/150ML IV SOLN: 750.0000 mg | INTRAVENOUS | Status: DC

## 2021-09-30 MED ORDER — ACETAMINOPHEN 325 MG PO TABS: 650.0000 mg | ORAL_TABLET | Freq: Four times a day (QID) | ORAL | Status: DC | PRN

## 2021-09-30 MED ORDER — SODIUM CHLORIDE 0.9 % IV SOLN
2.0000 g | INTRAVENOUS | Status: DC
  Administered 2021-09-30 – 2021-10-03 (×4): 2 g via INTRAVENOUS
  Filled 2021-09-30 (×5): qty 2

## 2021-09-30 MED ORDER — LACTATED RINGERS IV SOLN: INTRAVENOUS | Status: DC

## 2021-09-30 MED ORDER — IPRATROPIUM-ALBUTEROL 0.5-2.5 (3) MG/3ML IN SOLN: 3.0000 mL | Freq: Four times a day (QID) | RESPIRATORY_TRACT | Status: DC | PRN

## 2021-09-30 NOTE — Assessment & Plan Note (Signed)
Avoid medications which could further prolong QT interval.  ?

## 2021-09-30 NOTE — Progress Notes (Signed)
[name] [name] is a 88 y.o. male with medical history significant of CAD, HTN, hx of CVA, s/p TAVR, HLD, hypothyroidism, CKD 3B who presents with weakness, confusion and fever.  Patient lives alone however family lives nearby to assist.  Per his son at bedside no history of dementia however for the past week has become more confused.  Work-up revealed elevated troponin, peaked at 348 and down trended, sepsis secondary to multifocal pneumonia, in the setting of COVID-19 viral infection and possibly superimposed by bacterial coinfection.  He was started on broad-spectrum IV antibiotics, cefepime and IV vancomycin.  Will obtain MRSA screening test if negative will discontinue IV vancomycin. ? ?09/30/2021: Patient was seen and examined at his bedside.  His son was present in the room.  Reviewed CT scan with the son in the room.  Patient has pneumonia, possibly viral superimposed by bacterial pneumonia.  Patient's son has declined antivirals for COVID-19 infection. ? ?Please refer to H&P dictated by my partner Dr. Tonie Griffith on 09/30/2021 for further details of the assessment and plan. ?  ?

## 2021-09-30 NOTE — Assessment & Plan Note (Signed)
Continue metoprolol. Monitor BP ?

## 2021-09-30 NOTE — Progress Notes (Addendum)
Pharmacy Antibiotic Note ? ?[name] [name] is a 88 y.o. male admitted on 10/19/2021 with weakness and fever.  Pharmacy has been consulted for vancomycin dosing for sepsis.  1st dose given in ED ? ?Plan: ?Vancomycin '750mg'$  IV q48h (AUC 460.6, Scr 2.23) ?Cefepime per MD, renally adjusted to 2gm IV q24h ?Follow renal function and clinical course ? ?Height: '5\' 6"'$  (167.6 cm) ?IBW/kg (Calculated) : 63.8 ? ?Temp (24hrs), Avg:101.4 ?F (38.6 ?C), Min:99.6 ?F (37.6 ?C), Max:102.8 ?F (39.3 ?C) ? ?Recent Labs  ?Lab 10/18/2021 ?2134 10/20/2021 ?2309  ?WBC 6.2  --   ?CREATININE 2.23*  --   ?LATICACIDVEN 2.5* 2.4*  ?  ?CrCl cannot be calculated (Unknown ideal weight.).   ? ?Allergies  ?Allergen Reactions  ? Lasix [Furosemide] Swelling  ?  Tongue swelling  ? Mucinex [Guaifenesin Er]   ?  Tongue swelling  ? Amoxicillin-Pot Clavulanate Itching  ? Morphine And Related Nausea And Vomiting  ? Penicillins Hives  ? ? ?Antimicrobials this admission: ?Vanc 3/10 >> ?Cefepime 3/10 >> ?Flagyl 3/10 x1 ? ?Dose adjustments this admission: ? ? ?Microbiology results: ?3/10 BCx:  ?3/10 UCx:  ? ?Thank you for allowing pharmacy to be a part of this patient?s care. ? ?Dolly Rias RPh ?09/30/2021, 2:39 AM ? ?

## 2021-09-30 NOTE — Assessment & Plan Note (Signed)
Gentle IVF hydration ?Monitor renal function and electrolytes ?

## 2021-09-30 NOTE — Assessment & Plan Note (Signed)
No pulmonary edema on CXR.  ?Continue demadex and metoprolol ?

## 2021-09-30 NOTE — Assessment & Plan Note (Addendum)
Monitor serial troponin.  ?Cardiology consulted by ER and recommended not starting heparin infusion for therapeutic anticoagulation at this time. ?Echo in am ?Pt code status is DNR ?

## 2021-09-30 NOTE — Assessment & Plan Note (Addendum)
Mr. Heidenreich is admitted to Progressive Care bed.  ?Meets Sepsis with fever, tachypnea, AKI, encephalopathy, probable pneumonia.  ?Started on empiric antibiotic with cefepime and vancomycin.   ?IVF hydration ?Monitor lactic acid level.  ?CT chest pending ?

## 2021-09-30 NOTE — H&P (Signed)
History and Physical    Patient: [name] [name] UUV:[id number] DOB: [birth year] DOA: 10/16/2021 DOS: the patient was seen and examined on 09/30/2021 PCP: Patient, No Pcp Per (Inactive)  Patient coming from: Home  Chief Complaint:  Chief Complaint  Patient presents with   Weakness   HPI: [name] [name] is a 88 y.o. male with medical history significant of CAD, HTN, hx of CVA, s/p TAVR, HLD, hypothyroidism, CKD 3 who presents with weakness, confusion and fever.  Son states he tried to get him up to go the bathroom which he is normally able to do with his walker but he was too weak to ambulate.  Son states over the last 2 to 3 days he has not been himself and has had some confusion and decreased energy level and weak.  He has had decreased p.o. intake for the last 2 days according to the son.  He has had some lower abdominal pain and had urinary frequency but no burning with urination.  Has not had any hypotension.  He has a chronic cough that seems a little worse in the last few days according to the son.  Did not take any medications for symptoms at home.  Did not have any complaint of chest pain according to the son.  He was found to have an elevated troponin level.  There are no old troponin levels to compare to. In the emergency room he is found to be septic with tachypnea, confusion, AKI.  Urinalysis was negative.  Patient was suspected pneumonia and CT of the chest is pending. He has a remote history of smoking tobacco.  No alcohol use.  He is DNR CODE STATUS according to the son   Review of Systems: As mentioned in the history of present illness. All other systems reviewed and are negative. Past Medical History:  Diagnosis Date   AAA (abdominal aortic aneurysm)    s/p endovascular repair 2004   Aortic insufficiency    Prosthetic valve dysfunction   Aortic stenosis    s/p AVR in 2003, bovine   Arthritis    BCC (basal cell carcinoma) 09/17/2012   BPH (benign prostatic hyperplasia)     CAD (coronary artery disease)    stents in LXC and has a totally occluded RCA   Chicken pox    CVA (cerebral infarction) 12/09   Depression    Diverticulosis    Glaucoma    recently told by Dr Janyth Contes he did not have it   HTN (hypertension)    Hyperlipemia    Leaky heart valve    Measles    Mumps    Myocardial infarction [facility])    Personal history of poliomyelitis 09/17/2012   In childhood   Pneumonia    hx of   Polio    Prosthetic valve dysfunction    Aortic stenosis and insufficiency    Retinal hemorrhage of right eye 11/02/2019   Right iliac artery stenosis (HCC)    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   23 mm Edwards Sapien transcatheter heart valve placed via open left transfemoral approach   Shortness of breath    exertion, orthopnea   Sleeping difficulties    Stroke [facility])    no residual   Thyroid disease    Past Surgical History:  Procedure Laterality Date   ABDOMINAL AORTIC ANEURYSM REPAIR  2004   s/p endovascular repiar of AAA in 2004   AORTIC VALVE REPLACEMENT  2003   #4m pericardial valve  APPENDECTOMY     CARDIAC CATHETERIZATION     CORONARY ANGIOPLASTY     CORONARY ARTERY BYPASS GRAFT     CORONARY STENT PLACEMENT     INTRAOPERATIVE TRANSESOPHAGEAL ECHOCARDIOGRAM N/A 03/23/2014   Procedure: INTRAOPERATIVE TRANSESOPHAGEAL ECHOCARDIOGRAM;  Surgeon: Sherren Mocha, MD;  Location: [place];  Service: Open Heart Surgery;  Laterality: N/A;   JOINT REPLACEMENT     LEFT AND RIGHT HEART CATHETERIZATION WITH CORONARY ANGIOGRAM N/A 02/08/2014   Procedure: LEFT AND RIGHT HEART CATHETERIZATION WITH CORONARY ANGIOGRAM;  Surgeon: Blane Ohara, MD;  Location: [facility] CATH LAB;  Service: Cardiovascular;  Laterality: N/A;   TEE WITHOUT CARDIOVERSION N/A 09/02/2013   Procedure: TRANSESOPHAGEAL ECHOCARDIOGRAM (TEE);  Surgeon: Dorothy Spark, MD;  Location: [place];  Service: Cardiovascular;  Laterality: N/A;   TONSILLECTOMY     TRANSCATHETER AORTIC  VALVE REPLACEMENT, TRANSFEMORAL N/A 03/23/2014   Procedure: TRANSCATHETER AORTIC VALVE REPLACEMENT, TRANSFEMORAL;  Surgeon: Sherren Mocha, MD;  Location: [place];  Service: Open Heart Surgery;  Laterality: N/A;  valve in valve TAVR   WISDOM TOOTH EXTRACTION     Social History:  reports that he quit smoking about 41 years ago. His smoking use included cigarettes. He has a 15.00 pack-year smoking history. He has never used smokeless tobacco. He reports current alcohol use of about 7.0 standard drinks per week. He reports that he does not use drugs.  Allergies  Allergen Reactions   Lasix [Furosemide] Swelling    Tongue swelling   Mucinex [Guaifenesin Er]     Tongue swelling   Amoxicillin-Pot Clavulanate Itching   Morphine And Related Nausea And Vomiting   Penicillins Hives    Family History  Problem Relation Age of Onset   Colon cancer Mother    Cancer Mother    Aneurysm Father    Heart attack Brother        CHF   Prostate cancer Brother    Cancer Brother    Heart disease Brother    Cancer Daughter     Prior to Admission medications   Medication Sig Start Date End Date Taking? Authorizing Provider  acetaminophen (TYLENOL) 500 MG tablet Take 1,000 mg by mouth every 4 (four) hours as needed.    [provider]  aspirin 81 MG tablet Take 81 mg by mouth daily.    [provider]  B Complex-C (SUPER B COMPLEX) TABS Take 1 tablet by mouth daily.    [provider]  clindamycin (CLEOCIN) 150 MG capsule TK 4 CS PO PRIOR TO DENTAL APPOINTMENT 02/09/19   Kathyrn Drown D, NP  finasteride (PROSCAR) 5 MG tablet Take 1 tablet (5 mg total) by mouth daily. 05/18/21   Richardson Dopp T, PA-C  ipratropium (ATROVENT) 0.03 % nasal spray Place 2 sprays into both nostrils as needed. 05/04/19   [provider]  levothyroxine (SYNTHROID) 50 MCG tablet Take 1 tablet (50 mcg total) by mouth daily. 05/18/21   Richardson Dopp T, PA-C  metoprolol tartrate (LOPRESSOR) 25 MG tablet  Take 1 tablet (25 mg total) by mouth 2 (two) times daily. 05/18/21   Richardson Dopp T, PA-C  Multiple Vitamins-Minerals (PRESERVISION AREDS 2 PO) Take 1 capsule by mouth 2 (two) times daily.     [provider]  PARoxetine (PAXIL) 20 MG tablet Take 0.5 tablets (10 mg total) by mouth every morning. 05/22/21 05/22/22  Richardson Dopp T, PA-C  potassium chloride (MICRO-K) 10 MEQ CR capsule Take 1 capsule (10 mEq total) by mouth daily. 05/18/21  Kathlen Mody, Scott T, PA-C  torsemide (DEMADEX) 20 MG tablet Take 1 tablet (20 mg total) by mouth 2 (two) times daily. 08/30/21 08/30/22  Richardson Dopp T, PA-C    Physical Exam: Vitals:   10/15/2021 2300 09/26/2021 2330 09/30/21 0030 09/30/21 0100  BP: (!) 106/57 (!) 99/54 (!) 107/57 105/60  Pulse: 75 (!) 44 70 66  Resp: 20 (!) 23 (!) 24 (!) 24  Temp: (!) 102.3 F (39.1 C)   99.6 F (37.6 C)  TempSrc: Rectal   Rectal  SpO2: 94% 93% 92% 92%  Height:       General: WDWN, Alert and oriented x3.  Eyes: EOMI, PERRL, conjunctivae normal.  Sclera nonicteric HENT:  [place]/AT, external ears normal.  Nares patent without epistasis.  Mucous membranes are moist. Neck: Soft, normal range of motion, supple, no masses, Trachea midline Respiratory: Equal breath sounds but diminished in bases.  no wheezing, no crackles. Normal respiratory effort. Cardiovascular: Regular rate and rhythm, no murmurs / rubs / gallops. No extremity edema. 1+ pedal pulses.  Abdomen: Soft, no tenderness, nondistended, no rebound or guarding. No masses palpated. Bowel sounds normoactive Musculoskeletal: FROM. no cyanosis. Normal muscle tone.  Skin: Warm, dry, intact no rashes Neurologic: CN 2-12 grossly intact.  Normal speech.  Sensation intact to touch Psychiatric: Normal mood. Tired appearing  Data Reviewed: Lab Work:   WBC 6200 hemoglobin 16.7 hematocrit 51.7 platelets 124,000 sodium 141 potassium 4.4 chloride 104 bicarb 24 creatinine 2.23 increased from baseline of 1.5-1.7 BUN 53 glucose 143  albumin 3.4 alkaline phosphatase 83 AST 57 ALT 22 bilirubin 0.8 BNP 278.3 initial troponin 340.  Repeat troponin 327.  Lactic acid 2.5 initially and repeat lactic acid 2.4.  COVID pending  EKG shows normal sinus rhythm with no acute ST elevation or depression.  QTc prolonged at 482  CT of the head shows an old left occipital infarct.  Atrophy and chronic microvascular disease but no acute intracranial pathology. CT of the abdomen pelvis without contrast shows no acute findings.  Bibasilar atelectasis versus scarring noted.  Enlarged prostate.  Sigmoid diverticulosis.  Cholelithiasis present.  Stent graft of AAA CT of chest pending  Assessment and Plan: * Sepsis [facility]) [name] [name] is admitted to Progressive Care bed.  Meets Sepsis with fever, tachypnea, AKI, encephalopathy, probable pneumonia.  Started on empiric antibiotic with cefepime and vancomycin.   IVF hydration Monitor lactic acid level.  CT chest pending  NSTEMI (non-ST elevated myocardial infarction) (HCC) Monitor serial troponin.  Cardiology consulted by ER and recommended not starting heparin infusion for therapeutic anticoagulation at this time. Echo in am Pt code status is DNR  Acute kidney injury superimposed on chronic kidney disease ([place]) Gentle IVF hydration Monitor renal function and electrolytes  Chronic heart failure with preserved ejection fraction (HFpEF) ([place]) No pulmonary edema on CXR.  Continue demadex and metoprolol  Essential hypertension Continue metoprolol. Monitor BP  Coronary artery disease s/p Inf MI, stent to LCx, known CTO of RCA stable  S/P TAVR (transcatheter aortic valve replacement) stable  Prolonged QT interval Avoid medications which could further prolong QT interval  Advance Care Planning:   Code Status:  DNR. Confirmed code status with son at bedside. Heparin for DVT prophylaxis.   Consults: Cardiology consulted by ER PA and will see in am  Family Communication: Diagnosis and plan  discussed with patient and his son.  Son verbalized understanding and agrees with plan.  Further recommendations to follow as clinical indicated  Author: Eben Burow, MD 09/30/2021  2:23 AM  For on call review [email or web address].

## 2021-09-30 NOTE — Assessment & Plan Note (Signed)
stable °

## 2021-09-30 NOTE — ED Provider Notes (Signed)
? ? ? ? ?  Procedures ?Mar[facility] KitchenCritical Care ?Performed by: Montine Circle, PA-C ?Authorized by: Montine Circle, PA-C  ? ?Critical care provider statement:  ?  Critical care time (minutes):  55 ?  Critical care was necessary to treat or prevent imminent or life-threatening deterioration of the following conditions:  Sepsis ?  Critical care was time spent personally by me on the following activities:  Development of treatment plan with patient or surrogate, discussions with consultants, evaluation of patient's response to treatment, examination of patient, ordering and review of laboratory studies, ordering and review of radiographic studies, ordering and performing treatments and interventions, pulse oximetry, re-evaluation of patient's condition and review of old charts  ? ?  ?Montine Circle, PA-C ?09/30/21 0117 ? ?  ?Shanon Rosser, MD ?09/30/21 0315 ? ?

## 2021-10-01 DIAGNOSIS — A419 Sepsis, unspecified organism: Secondary | ICD-10-CM | POA: Diagnosis not present

## 2021-10-01 DIAGNOSIS — R6520 Severe sepsis without septic shock: Secondary | ICD-10-CM | POA: Diagnosis not present

## 2021-10-01 DIAGNOSIS — G9340 Encephalopathy, unspecified: Secondary | ICD-10-CM | POA: Diagnosis not present

## 2021-10-01 LAB — CBC WITH DIFFERENTIAL/PLATELET
Abs Immature Granulocytes: 0.05 10*3/uL (ref 0.00–0.07)
Basophils Absolute: 0 10*3/uL (ref 0.0–0.1)
Basophils Relative: 0 %
Eosinophils Absolute: 0 10*3/uL (ref 0.0–0.5)
Eosinophils Relative: 0 %
HCT: 45.2 % (ref 39.0–52.0)
Hemoglobin: 14.8 g/dL (ref 13.0–17.0)
Immature Granulocytes: 1 %
Lymphocytes Relative: 7 %
Lymphs Abs: 0.4 10*3/uL — ABNORMAL LOW (ref 0.7–4.0)
MCH: 32.4 pg (ref 26.0–34.0)
MCHC: 32.7 g/dL (ref 30.0–36.0)
MCV: 98.9 fL (ref 80.0–100.0)
Monocytes Absolute: 0.5 10*3/uL (ref 0.1–1.0)
Monocytes Relative: 7 %
Neutro Abs: 5.7 10*3/uL (ref 1.7–7.7)
Neutrophils Relative %: 85 %
Platelets: 124 10*3/uL — ABNORMAL LOW (ref 150–400)
RBC: 4.57 MIL/uL (ref 4.22–5.81)
RDW: 14.2 % (ref 11.5–15.5)
WBC: 6.6 10*3/uL (ref 4.0–10.5)
nRBC: 0 % (ref 0.0–0.2)

## 2021-10-01 LAB — COMPREHENSIVE METABOLIC PANEL
ALT: 20 U/L (ref 0–44)
AST: 72 U/L — ABNORMAL HIGH (ref 15–41)
Albumin: 2.7 g/dL — ABNORMAL LOW (ref 3.5–5.0)
Alkaline Phosphatase: 63 U/L (ref 38–126)
Anion gap: 8 (ref 5–15)
BUN: 65 mg/dL — ABNORMAL HIGH (ref 8–23)
CO2: 27 mmol/L (ref 22–32)
Calcium: 7.8 mg/dL — ABNORMAL LOW (ref 8.9–10.3)
Chloride: 106 mmol/L (ref 98–111)
Creatinine, Ser: 2.32 mg/dL — ABNORMAL HIGH (ref 0.61–1.24)
GFR, Estimated: 25 mL/min — ABNORMAL LOW (ref >60)
Glucose, Bld: 135 mg/dL — ABNORMAL HIGH (ref 70–99)
Potassium: 3.7 mmol/L (ref 3.5–5.1)
Sodium: 141 mmol/L (ref 135–145)
Total Bilirubin: 0.7 mg/dL (ref 0.3–1.2)
Total Protein: 5.7 g/dL — ABNORMAL LOW (ref 6.5–8.1)

## 2021-10-01 LAB — PHOSPHORUS: Phosphorus: 4.2 mg/dL (ref 2.5–4.6)

## 2021-10-01 LAB — D-DIMER, QUANTITATIVE: D-Dimer, Quant: 9.93 ug/mL-FEU — ABNORMAL HIGH (ref 0.00–0.50)

## 2021-10-01 LAB — MAGNESIUM: Magnesium: 2.2 mg/dL (ref 1.7–2.4)

## 2021-10-01 LAB — PROCALCITONIN: Procalcitonin: 0.44 ng/mL

## 2021-10-01 MED ORDER — GUAIFENESIN ER 600 MG PO TB12: 1200.0000 mg | ORAL_TABLET | Freq: Two times a day (BID) | ORAL | Status: DC

## 2021-10-01 MED ORDER — PAROXETINE HCL 10 MG PO TABS
10.0000 mg | ORAL_TABLET | Freq: Every day | ORAL | Status: DC
Start: 2021-10-02 — End: 2021-10-04
  Administered 2021-10-02: 10 mg via ORAL
  Filled 2021-10-01 (×3): qty 1

## 2021-10-01 MED ORDER — METHYLPREDNISOLONE SODIUM SUCC 40 MG IJ SOLR
40.0000 mg | Freq: Two times a day (BID) | INTRAMUSCULAR | Status: DC
  Administered 2021-10-01 – 2021-10-02 (×3): 40 mg via INTRAVENOUS
  Filled 2021-10-01 (×3): qty 1

## 2021-10-01 MED ORDER — BENZONATATE 100 MG PO CAPS
200.0000 mg | ORAL_CAPSULE | Freq: Three times a day (TID) | ORAL | Status: AC
  Administered 2021-10-01 – 2021-10-02 (×6): 200 mg via ORAL
  Filled 2021-10-01 (×7): qty 2

## 2021-10-01 MED ORDER — POTASSIUM CHLORIDE CRYS ER 20 MEQ PO TBCR
20.0000 meq | EXTENDED_RELEASE_TABLET | Freq: Every day | ORAL | Status: DC
  Administered 2021-10-01 – 2021-10-02 (×2): 20 meq via ORAL
  Filled 2021-10-01 (×3): qty 1

## 2021-10-01 MED ORDER — ZINC SULFATE 220 (50 ZN) MG PO CAPS
220.0000 mg | ORAL_CAPSULE | Freq: Every day | ORAL | Status: DC
  Administered 2021-10-01 – 2021-10-02 (×2): 220 mg via ORAL
  Filled 2021-10-01 (×3): qty 1

## 2021-10-01 MED ORDER — PROSIGHT PO TABS
1.0000 | ORAL_TABLET | Freq: Two times a day (BID) | ORAL | Status: DC
  Administered 2021-10-01 – 2021-10-02 (×3): 1 via ORAL
  Filled 2021-10-01 (×4): qty 1

## 2021-10-01 MED ORDER — IPRATROPIUM-ALBUTEROL 0.5-2.5 (3) MG/3ML IN SOLN: 3.0000 mL | Freq: Four times a day (QID) | RESPIRATORY_TRACT | Status: DC

## 2021-10-01 MED ORDER — ASCORBIC ACID 500 MG PO TABS
500.0000 mg | ORAL_TABLET | Freq: Every day | ORAL | Status: DC
  Administered 2021-10-01 – 2021-10-02 (×2): 500 mg via ORAL
  Filled 2021-10-01 (×3): qty 1

## 2021-10-01 MED ORDER — VITAMIN D 25 MCG (1000 UNIT) PO TABS
1000.0000 [IU] | ORAL_TABLET | Freq: Every day | ORAL | Status: DC
  Administered 2021-10-01 – 2021-10-02 (×2): 1000 [IU] via ORAL
  Filled 2021-10-01 (×3): qty 1

## 2021-10-01 MED ORDER — IPRATROPIUM-ALBUTEROL 20-100 MCG/ACT IN AERS
1.0000 | INHALATION_SPRAY | Freq: Four times a day (QID) | RESPIRATORY_TRACT | Status: DC
  Administered 2021-10-01 – 2021-10-02 (×4): 1 via RESPIRATORY_TRACT
  Filled 2021-10-01: qty 4

## 2021-10-01 NOTE — Progress Notes (Addendum)
PROGRESS NOTE  [name] [name] OBS:[id number] DOB: Nov 21, 1922 DOA: 10/14/2021 PCP: Patient, No Pcp Per (Inactive)  HPI/Recap of past 24 hours: [name] [name] is a 88 y.o. male with medical history significant of CAD, HTN, hx of CVA, s/p TAVR, HLD, hypothyroidism, CKD 3B who presents from home to [facility] with complaints of generalized weakness, altered mental status, and fever.  Patient lives alone, no reported dementia, family lives nearby to assist.    Work-up revealed elevated troponin, peaked at 348 and down trended, sepsis secondary to multifocal pneumonia, in the setting of COVID-19 viral infection and possibly superimposed by bacterial coinfection.  He was started on broad-spectrum IV antibiotics, cefepime and IV vancomycin.  Patient's son has declined antivirals for COVID-19 infection.  MRSA screening test negative, IV vancomycin discontinued on 10/01/2021.     10/01/2021: Patient was seen and examined at his bedside.  He is alert and responding to questions appropriately oriented x3.  He has mild hearing impairment.   Assessment/Plan: Principal Problem:   Sepsis ([place]) Active Problems:   NSTEMI (non-ST elevated myocardial infarction) ([place])   Acute kidney injury superimposed on chronic kidney disease (HCC)   Chronic heart failure with preserved ejection fraction (HFpEF) ([place])   Essential hypertension   Coronary artery disease s/p Inf MI, stent to LCx, known CTO of RCA   S/P TAVR (transcatheter aortic valve replacement)   Prolonged QT interval  Sepsis ([place]) secondary to multifocal pneumonia, POA. Post reviewed CT chest without contrast done on 09/30/1941, it showed bilateral pulmonary infiltrates. In the setting of COVID-19 viral infection and possibly superimposed by bacterial coinfection.  He was started on broad-spectrum IV antibiotics, cefepime and IV vancomycin.  Patient's son has declined antivirals for COVID-19 infection.  MRSA screening test negative, IV vancomycin discontinued on  10/01/2021.   Continue to monitor fever curve and WBC.  Patient has declined his lab work this morning, reordered.  Elevated troponin, suspect demand ischemia in the setting of acute hypoxic respiratory failure. Troponin peaked at 340 and down trended. No chest pain at the time of this visit. 2D echo is pending. Continue home Lopressor and home aspirin. Continue to closely monitor on telemetry.  COVID-19 viral infection COVID-19 screening test positive on 10/20/2021, in isolation x10 days. Inflammatory markers are pending. Monitor fever curve and WBC. Patient's son has declined antibiotics. Vitamin D3, zinc, vitamin C.  Acute hypoxic respiratory failure secondary to multifocal pneumonia, COVID-19 viral infection. Started IV Solu-Medrol 40 mg twice daily on 10/09/2021 Started Combivent Incentive spirometer and flutter valve Antitussives Tessalon Perles 3 times daily x3 days. Maintain oxygen saturation greater than 92%  Acute kidney injury superimposed on chronic kidney disease ([place]) Continue gentle IVF hydration Monitor renal function and electrolytes BMP is pending as the patient refused blood draws this morning.  Reordered.   Chronic heart failure with preserved ejection fraction (HFpEF) (HCC) No pulmonary edema on CXR.  Continue demadex and metoprolol Follow 2D echo ordered on 10/01/2021. Monitor strict I's and O's and daily weight.   Essential hypertension BP stable. Continue home Lopressor and torsemide. Continue to closely monitor vital signs.   Coronary artery disease s/p Inf MI, stent to LCx, known CTO of RCA Denies any anginal symptoms.   S/P TAVR (transcatheter aortic valve replacement) stable   Prolonged QT interval Avoid medications which could further prolong QT interval Optimize magnesium and potassium levels  Physical debility PT OT to assess Fall precautions.   Critical care time: 65 minutes.    Advance Care Planning:  Code Status:  DNR. Confirmed  code status with son at bedside. Heparin for DVT prophylaxis.    Consults: Cardiology consulted by ER PA and will see in am   Family Communication: Updated her son at bedside on 09/30/2021.   Antimicrobials: Cefepime IV vancomycin DC'd on 10/01/2021.  DVT prophylaxis: SQ heparin 3 times daily.  Status is: Inpatient Patient requires at least 2 midnights for further evaluation and treatment of present condition.    Objective: Vitals:   09/30/21 2357 10/01/21 0319 10/01/21 0632 10/01/21 0945  BP:   (!) 141/67   Pulse:   85   Resp:   20   Temp:   99.3 F (37.4 C)   TempSrc:   Oral   SpO2: 94%  92% 92%  Weight:  84.1 kg    Height:        Intake/Output Summary (Last 24 hours) at 10/01/2021 1125 Last data filed at 10/01/2021 0700 Gross per 24 hour  Intake 1354.48 ml  Output 2500 ml  Net -1145.52 ml   Filed Weights   09/30/21 0319 10/01/21 0319  Weight: 84.1 kg 84.1 kg    Exam:  General: 88 y.o. year-old male well developed well nourished in no acute distress.  Alert and oriented x3. Cardiovascular: Regular rate and rhythm with no rubs or gallops.  No thyromegaly or JVD noted.   Respiratory: Mild rales at bases.  Faint wheezing bilaterally.  Poor inspiratory effort. Abdomen: Soft nontender nondistended with normal bowel sounds x4 quadrants. Musculoskeletal: Trace lower extremity edema bilaterally. Skin: No ulcerative lesions noted or rashes, Psychiatry: Mood is appropriate for condition and setting Neuro: Alert and awake.   Data Reviewed: CBC: Recent Labs  Lab 10/08/2021 2134 09/30/21 0343  WBC 6.2 5.4  NEUTROABS 5.3  --   HGB 16.7 14.8  HCT 51.7 45.6  MCV 99.0 100.0  PLT 124* 465*   Basic Metabolic Panel: Recent Labs  Lab 10/15/2021 2134 09/30/21 0343  NA 141 141  K 4.4 4.1  CL 104 105  CO2 24 25  GLUCOSE 143* 133*  BUN 53* 57*  CREATININE 2.23* 2.52*  CALCIUM 8.4* 7.9*   GFR: Estimated Creatinine Clearance: 17.4 mL/min (A) (by C-G formula based  on SCr of 2.52 mg/dL (H)). Liver Function Tests: Recent Labs  Lab 10/04/2021 2134 09/30/21 0343  AST 57* 50*  ALT 22 20  ALKPHOS 83 65  BILITOT 0.8 0.8  PROT 7.0 5.8*  ALBUMIN 3.4* 2.8*   No results for input(s): LIPASE, AMYLASE in the last 168 hours. No results for input(s): AMMONIA in the last 168 hours. Coagulation Profile: Recent Labs  Lab 10/20/2021 2134  INR 1.0   Cardiac Enzymes: No results for input(s): CKTOTAL, CKMB, CKMBINDEX, TROPONINI in the last 168 hours. BNP (last 3 results) No results for input(s): PROBNP in the last 8760 hours. HbA1C: No results for input(s): HGBA1C in the last 72 hours. CBG: No results for input(s): GLUCAP in the last 168 hours. Lipid Profile: No results for input(s): CHOL, HDL, LDLCALC, TRIG, CHOLHDL, LDLDIRECT in the last 72 hours. Thyroid Function Tests: No results for input(s): TSH, T4TOTAL, FREET4, T3FREE, THYROIDAB in the last 72 hours. Anemia Panel: No results for input(s): VITAMINB12, FOLATE, FERRITIN, TIBC, IRON, RETICCTPCT in the last 72 hours. Urine analysis:    Component Value Date/Time   COLORURINE YELLOW 10/15/2021 2133   APPEARANCEUR HAZY (A) 09/22/2021 2133   LABSPEC 1.009 10/16/2021 2133   PHURINE 5.0 09/25/2021 2133   GLUCOSEU NEGATIVE 10/08/2021 2133  HGBUR MODERATE (A) 10/14/2021 2133   BILIRUBINUR NEGATIVE 10/13/2021 2133   KETONESUR NEGATIVE 09/28/2021 2133   PROTEINUR NEGATIVE 09/30/2021 2133   UROBILINOGEN 0.2 03/26/2014 1507   NITRITE NEGATIVE 10/15/2021 2133   LEUKOCYTESUR NEGATIVE 10/11/2021 2133   Sepsis Labs: '@LABRCNTIP'$ (procalcitonin:4,lacticidven:4)  ) Recent Results (from the past 240 hour(s))  Blood Culture (routine x 2)     Status: None (Preliminary result)   Collection Time: 10/12/2021  9:34 PM   Specimen: BLOOD  Result Value Ref Range Status   Specimen Description   Final    BLOOD RIGHT ANTECUBITAL Performed at [facility], [place] [address] [place], [place] [address]     Special Requests   Final    BOTTLES DRAWN AEROBIC AND ANAEROBIC Blood Culture adequate volume Performed at [place] [address] [place], [place] [address]    Culture   Final    NO GROWTH 2 DAYS Performed at [place] [address] [place], [place] [address]    Report Status PENDING  Incomplete  Urine Culture     Status: Abnormal (Preliminary result)   Collection Time: 09/27/2021  9:34 PM   Specimen: In/Out Cath Urine  Result Value Ref Range Status   Specimen Description   Final    IN/OUT CATH URINE Performed at [place] [address] [place], [place] [address]    Special Requests   Final    NONE Performed at [facility], [place] [address], [place], [place] [address]    Culture (A)  Final    30,000 COLONIES/mL ENTEROCOCCUS FAECALIS SUSCEPTIBILITIES TO FOLLOW Performed at [facility], [place] [address] [place], [place] [address]    Report Status PENDING  Incomplete  Blood Culture (routine x 2)     Status: None (Preliminary result)   Collection Time: 09/24/2021 10:45 PM   Specimen: BLOOD  Result Value Ref Range Status   Specimen Description   Final    BLOOD LEFT ANTECUBITAL Performed at [place] [address], [place], [place] [address]    Special Requests   Final    BOTTLES DRAWN AEROBIC AND ANAEROBIC Blood Culture adequate volume Performed at [place] [address] [place], [place] [address]    Culture   Final    NO GROWTH 1 DAY Performed at [facility], [place] [address] [place],  [address]    Report Status PENDING  Incomplete  Resp Panel by RT-PCR (Flu A&B, Covid)     Status: Abnormal   Collection Time: 10/16/2021 11:08 PM   Specimen: Nasopharyngeal(NP) swabs in vial transport medium  Result Value Ref Range Status   SARS Coronavirus 2 by RT PCR POSITIVE (A) NEGATIVE Final    Comment: (NOTE) SARS-CoV-2 target nucleic acids  are DETECTED.  The SARS-CoV-2 RNA is generally detectable in upper respiratory specimens during the acute phase of infection. Positive results are indicative of the presence of the identified virus, but do not rule out bacterial infection or co-infection with other pathogens not detected by the test. Clinical correlation with patient history and other diagnostic information is necessary to determine patient infection status. The expected result is Negative.  Fact Sheet for Patients: EntrepreneurPulse.com.au  Fact Sheet for Healthcare Providers: IncredibleEmployment.be  This test is not yet approved or cleared by the [place] FDA and  has been authorized for detection and/or diagnosis of SARS-CoV-2 by FDA under an Emergency Use Authorization (EUA).  This EUA will remain in effect (meaning this test can be used) for the duration of  the COVID-19 declaration under Section 564(b)(1) of the A ct, 21 U.S.C. section 360bbb-3(b)(1), unless the authorization is terminated or revoked sooner.     Influenza A by PCR NEGATIVE NEGATIVE Final   Influenza B by PCR NEGATIVE NEGATIVE Final    Comment: (NOTE) The Xpert Xpress SARS-CoV-2/FLU/RSV plus assay is intended as an aid in the diagnosis of influenza from Nasopharyngeal swab specimens and should not be used as a sole basis for treatment. Nasal washings and aspirates are unacceptable for Xpert Xpress SARS-CoV-2/FLU/RSV testing.  Fact Sheet for Patients: EntrepreneurPulse.com.au  Fact Sheet for Healthcare Providers: IncredibleEmployment.be  This test is not yet approved or cleared by the [place] FDA and has been authorized for detection and/or diagnosis of SARS-CoV-2 by FDA under an Emergency Use Authorization (EUA). This EUA will remain in effect (meaning this test can be used) for the duration of the COVID-19 declaration under Section 564(b)(1) of the Act,  21 U.S.C. section 360bbb-3(b)(1), unless the authorization is terminated or revoked.  Performed at [facility], [place] [address], [place], [place] [address]   MRSA Next Gen by PCR, Nasal     Status: None   Collection Time: 09/30/21  1:00 PM   Specimen: Nasal Mucosa; Nasal Swab  Result Value Ref Range Status   MRSA by PCR Next Gen NOT DETECTED NOT DETECTED Final    Comment: (NOTE) The GeneXpert MRSA Assay (FDA approved for NASAL specimens only), is one component of a comprehensive MRSA colonization surveillance program. It is not intended to diagnose MRSA infection nor to guide or monitor treatment for MRSA infections. Test performance is not FDA approved in patients less than 54 years old. Performed at [facility], [place] [address], [place], [place] [address]       Studies: No results found.  Scheduled Meds:  vitamin C  500 mg Oral Daily   aspirin  81 mg Oral Daily   benzonatate  200 mg Oral TID   cholecalciferol  1,000 Units Oral Daily   finasteride  5 mg Oral Daily   guaiFENesin  1,200 mg Oral BID   heparin  5,000 Units Subcutaneous Q8H   Ipratropium-Albuterol  1 puff Inhalation Q6H   levothyroxine  50 mcg Oral Q0600   methylPREDNISolone (SOLU-MEDROL) injection  40 mg Intravenous BID   metoprolol tartrate  25 mg Oral BID   torsemide  20 mg Oral BID   zinc sulfate  220 mg Oral Daily    Continuous Infusions:  ceFEPime (MAXIPIME) IV Stopped (09/30/21 2315)   lactated ringers 75 mL/hr at 10/01/21 0329     LOS: 1 day     Kayleen Memos, MD [facility] Pager [phone]  If 7PM-7AM, please contact night-coverage [email or web address] Password [facility] 10/01/2021, 11:25 AM

## 2021-10-01 NOTE — Telephone Encounter (Signed)
Patient admitted to [facility] for sepsis syndrome. ?Richardson Dopp, PA-C    ?10/01/2021 4:54 PM   ?

## 2021-10-01 NOTE — Progress Notes (Signed)
Patient's confusion increasing this morning as he is pulling at lines and nasal cannula. Patient pulled IV out. Patient has been reoriented/educated and mitts have been placed. Renae Gloss, RN ?10/01/2021 ? ?

## 2021-10-01 NOTE — Evaluation (Addendum)
Physical Therapy Evaluation ?Patient Details ?Name: [name] [name] ?MRN: [id number] ?DOB: 1922/10/16 ?Today's Date: 10/01/2021 ? ?History of Present Illness ? 88 yo male admitted with COVID, NSTEMI, AMS, sepsis. Hx of CAD, CVA, TAVR-stenosis, AAA, polio, CKD  ?Clinical Impression ? On eval, pt required Mod A for mobility. He was able to stand and pivot over to recliner using RW. Pt presents with general weakness, decreased activity tolerance, and impaired gait and balance. Remained on [place] O2 during session. At this time, PT recommendation is for ST SNF rehab. Will plan to follow and progress activity as tolerated. If pt progresses well, and he has assistance from family, he could possibly d/c home.  ?   ? ?Recommendations for follow up therapy are one component of a multi-disciplinary discharge planning process, led by the attending physician.  Recommendations may be updated based on patient status, additional functional criteria and insurance authorization. ? ?Follow Up Recommendations Skilled nursing-short term rehab (<3 hours/day) ? ?  ?Assistance Recommended at Discharge Frequent or constant Supervision/Assistance  ?Patient can return home with the following ? A lot of help with walking and/or transfers;A lot of help with bathing/dressing/bathroom;Assistance with cooking/housework;Assist for transportation;Help with stairs or ramp for entrance ? ?  ?Equipment Recommendations Rolling walker (2 wheels)  ?Recommendations for Other Services ?    ?  ?Functional Status Assessment Patient has had a recent decline in their functional status and demonstrates the ability to make significant improvements in function in a reasonable and predictable amount of time.  ? ?  ?Precautions / Restrictions Precautions ?Precautions: Fall ?Precaution Comments: monitor O2 ?Restrictions ?Weight Bearing Restrictions: No  ? ?  ? ?Mobility ? Bed Mobility ?Overal bed mobility: Needs Assistance ?Bed Mobility: Supine to Sit ?  ?  ?Supine to sit:  Mod assist, HOB elevated ?  ?  ?General bed mobility comments: Assist for trunk and LEs. Increased time. Cues required. ?  ? ?Transfers ?Overall transfer level: Needs assistance ?Equipment used: Rolling walker (2 wheels) ?Transfers: Sit to/from Stand, Bed to chair/wheelchair/BSC ?Sit to Stand: From elevated surface, Mod assist ?  ?Step pivot transfers: Min assist ?  ?  ?  ?General transfer comment: Assist to power up, stabilize, control descent. Cues for safety, technique, hand placement. Increased time. Pt reports feeling weak and fatigued. Remained on [place] O2 ?  ? ?Ambulation/Gait ?  ?  ?  ?  ?  ?  ?  ?General Gait Details: Nt on today ? ?Stairs ?  ?  ?  ?  ?  ? ?Wheelchair Mobility ?  ? ?Modified Rankin (Stroke Patients Only) ?  ? ?  ? ?Balance Overall balance assessment: Needs assistance ?  ?  ?  ?  ?Standing balance support: Bilateral upper extremity supported, During functional activity ?Standing balance-Leahy Scale: Poor ?  ?  ?  ?  ?  ?  ?  ?  ?  ?  ?  ?  ?   ? ? ? ?Pertinent Vitals/Pain Pain Assessment ?Pain Assessment: Faces ?Faces Pain Scale: Hurts even more ?Pain Location: back ?Pain Descriptors / Indicators: Discomfort, Sore, Aching, Grimacing ?Pain Intervention(s): Limited activity within patient's tolerance, Monitored during session, Repositioned  ? ? ?Home Living Family/patient expects to be discharged to:: Unsure ?Living Arrangements: Alone ?  ?Type of Home: House ?Home Access: Level entry ?  ?  ?  ?Home Layout: One level ?Home Equipment: Kasandra Knudsen - single point ?   ?  ?Prior Function Prior Level of Function : Independent/Modified Independent ?  ?  ?  ?  ?  ?  ?  ?  ?  ? ? ?  Hand Dominance  ?   ? ?  ?Extremity/Trunk Assessment  ? Upper Extremity Assessment ?Upper Extremity Assessment: Defer to OT evaluation ?  ? ?Lower Extremity Assessment ?Lower Extremity Assessment: Generalized weakness ?  ? ?Cervical / Trunk Assessment ?Cervical / Trunk Assessment: Normal  ?Communication  ? Communication: HOH   ?Cognition Arousal/Alertness: Awake/alert ?Behavior During Therapy: [facility] for tasks assessed/performed ?Overall Cognitive Status: No family/caregiver present to determine baseline cognitive functioning ?  ?  ?  ?  ?  ?  ?  ?  ?  ?  ?  ?  ?  ?  ?  ?  ?  ?  ?  ? ?  ?General Comments   ? ?  ?Exercises    ? ?Assessment/Plan  ?  ?PT Assessment Patient needs continued PT services  ?PT Problem List Decreased strength;Decreased mobility;Decreased range of motion;Decreased activity tolerance;Decreased balance;Decreased knowledge of use of DME ? ?   ?  ?PT Treatment Interventions DME instruction;Gait training;Therapeutic exercise;Balance training;Functional mobility training;Patient/family education;Therapeutic activities   ? ?PT Goals (Current goals can be found in the Care Plan section)  ?Acute Rehab PT Goals ?Patient Stated Goal: get stronger. feel better ?PT Goal Formulation: With patient ?Time For Goal Achievement: 10/15/21 ?Potential to Achieve Goals: Good ? ?  ?Frequency Min 2X/week ?  ? ? ?Co-evaluation   ?  ?  ?  ?  ? ? ?  ?AM-PAC PT "6 Clicks" Mobility  ?Outcome Measure Help needed turning from your back to your side while in a flat bed without using bedrails?: A Lot ?Help needed moving from lying on your back to sitting on the side of a flat bed without using bedrails?: A Lot ?Help needed moving to and from a bed to a chair (including a wheelchair)?: A Lot ?Help needed standing up from a chair using your arms (e.g., wheelchair or bedside chair)?: A Lot ?Help needed to walk in hospital room?: A Lot ?Help needed climbing 3-5 steps with a railing? : Total ?6 Click Score: 11 ? ?  ?End of Session   ?Activity Tolerance: Patient limited by fatigue ?Patient left: in chair;with call bell/phone within reach;with chair alarm set ?  ?PT Visit Diagnosis: Muscle weakness (generalized) (M62.81);Difficulty in walking, not elsewhere classified (R26.2);Pain ?  ? ?Time: 4132-4401 ?PT Time Calculation (min) (ACUTE ONLY): 17  min ? ? ?Charges:   PT Evaluation ?$PT Eval Moderate Complexity: 1 Mod ?  ?  ?   ? ? ? ? ? ?Natalie Mceuen P, PT ?Acute Rehabilitation  ?Office: [phone] ?Pager: [phone] ? ?  ? ?

## 2021-10-01 NOTE — Evaluation (Signed)
Occupational Therapy Evaluation ?Patient Details ?Name: [name] [name] ?MRN: [id number] ?DOB: 04/28/1923 ?Today's Date: 10/01/2021 ? ? ?History of Present Illness 88 yo male admitted with COVID, NSTEMI, AMS, sepsis. Hx of CAD, CVA, TAVR-stenosis, AAA, polio, CKD  ? ?Clinical Impression ?  ?[name] is a 88 year old man who lives alone is mostly independent and uses a cane for ambulation. Patient's son lives by and family checks on him throughout the day. Today patient limited to just bedside pivoting, needing increased assistance for LB ADLs and toileting and requiring 5 L [place] to maintain o2 sats.  He exhibits generalized weakness and decreased activity tolerance Patient will benefit from skilled OT services while in hospital to improve deficits and learn compensatory strategies as needed in order to return to PLOF.  Family wanting patient to return home at discharge. As long as patient progresses well with therapy - home at discharge is acceptable plan. ?   ? ?Recommendations for follow up therapy are one component of a multi-disciplinary discharge planning process, led by the attending physician.  Recommendations may be updated based on patient status, additional functional criteria and insurance authorization.  ? ?Follow Up Recommendations ? Home health OT  ?  ?Assistance Recommended at Discharge Frequent or constant Supervision/Assistance  ?Patient can return home with the following A little help with walking and/or transfers;A lot of help with bathing/dressing/bathroom;Assistance with cooking/housework;Help with stairs or ramp for entrance ? ?  ?Functional Status Assessment ? Patient has had a recent decline in their functional status and demonstrates the ability to make significant improvements in function in a reasonable and predictable amount of time.  ?Equipment Recommendations ?  (TBD)  ?  ?Recommendations for Other Services   ? ? ?  ?Precautions / Restrictions Precautions ?Precautions:  Fall ?Precaution Comments: monitor O2 ?Restrictions ?Weight Bearing Restrictions: No  ? ?  ? ?Mobility Bed Mobility ?  ?  ?  ?  ?  ?  ?  ?  ?  ? ?Transfers ?  ?  ?  ?  ?  ?  ?  ?  ?  ?  ?  ? ?  ?Balance Overall balance assessment: Mild deficits observed, not formally tested ?Sitting-balance support: No upper extremity supported ?Sitting balance-Leahy Scale: Fair ?  ?  ?Standing balance support: Bilateral upper extremity supported, During functional activity ?Standing balance-Leahy Scale: Poor ?  ?  ?  ?  ?  ?  ?  ?  ?  ?  ?  ?  ?   ? ?ADL either performed or assessed with clinical judgement  ? ?ADL Overall ADL's : Needs assistance/impaired ?Eating/Feeding: Set up;Sitting ?  ?Grooming: Set up;Sitting ?  ?Upper Body Bathing: Set up;Sitting ?  ?Lower Body Bathing: Moderate assistance;Sit to/from stand ?  ?Upper Body Dressing : Set up;Sitting ?  ?Lower Body Dressing: Moderate assistance;Sit to/from stand ?  ?Toilet Transfer: Minimal assistance;BSC/3in1;Stand-pivot ?  ?Toileting- Clothing Manipulation and Hygiene: Total assistance;Sit to/from stand ?Toileting - Clothing Manipulation Details (indicate cue type and reason): reliant on external assistance to help ?  ?  ?Functional mobility during ADLs: Minimal assistance ?General ADL Comments: Patient found on BSC on 5 L [place]. Patient unable to don socks (and doesn't do so at home) and unable to assist with toileting due to using both hands to steady himself.  ? ? ? ?Vision   ?Vision Assessment?: No apparent visual deficits  ?   ?Perception   ?  ?Praxis   ?  ? ?Pertinent  Vitals/Pain Pain Assessment ?Pain Assessment: No/denies pain  ? ? ? ?Hand Dominance   ?  ?Extremity/Trunk Assessment Upper Extremity Assessment ?Upper Extremity Assessment: RUE deficits/detail;LUE deficits/detail ?RUE Deficits / Details: WFL ROM, 5/5 strength ?RUE Sensation: WNL ?RUE Coordination: WNL ?LUE Deficits / Details: WFL ROM, 5/5 strength ?LUE Sensation: WNL ?LUE Coordination: WNL ?  ?Lower Extremity  Assessment ?Lower Extremity Assessment: Defer to PT evaluation ?  ?Cervical / Trunk Assessment ?Cervical / Trunk Assessment: Normal ?  ?Communication Communication ?Communication: HOH ?  ?Cognition Arousal/Alertness: Awake/alert ?Behavior During Therapy: [facility] for tasks assessed/performed ?Overall Cognitive Status: Within Functional Limits for tasks assessed ?  ?  ?  ?  ?  ?  ?  ?  ?  ?  ?  ?  ?  ?  ?  ?  ?  ?  ?  ?General Comments    ? ?  ?Exercises   ?  ?Shoulder Instructions    ? ? ?Home Living Family/patient expects to be discharged to:: Private residence ?Living Arrangements: Alone ?Available Help at Discharge: Family;Available PRN/intermittently ?Type of Home: House ?Home Access: Level entry ?  ?  ?Home Layout: One level ?  ?  ?Bathroom Shower/Tub: Walk-in shower ?  ?  ?  ?  ?Home Equipment: Kasandra Knudsen - single point;Shower seat ?  ?  ?  ? ?  ?Prior Functioning/Environment Prior Level of Function : Independent/Modified Independent ?  ?  ?  ?  ?  ?  ?Mobility Comments: uses a cane ?ADLs Comments: independent, family reports decreased interest in bathing, doesn't wear socks ?  ? ?  ?  ?OT Problem List: Decreased activity tolerance;Decreased strength;Impaired balance (sitting and/or standing);Decreased knowledge of use of DME or AE;Decreased safety awareness;Cardiopulmonary status limiting activity ?  ?   ?OT Treatment/Interventions: Self-care/ADL training;DME and/or AE instruction;Therapeutic activities;Balance training;Patient/family education  ?  ?OT Goals(Current goals can be found in the care plan section) Acute Rehab OT Goals ?Patient Stated Goal: walk to bathroom ?OT Goal Formulation: With family ?Time For Goal Achievement: 10/15/21 ?Potential to Achieve Goals: Good  ?OT Frequency: Min 2X/week ?  ? ?Co-evaluation   ?  ?  ?  ?  ? ?  ?AM-PAC OT "6 Clicks" Daily Activity     ?Outcome Measure Help from another person eating meals?: A Little ?Help from another person taking care of personal grooming?: A Little ?Help  from another person toileting, which includes using toliet, bedpan, or urinal?: A Lot ?Help from another person bathing (including washing, rinsing, drying)?: A Lot ?Help from another person to put on and taking off regular upper body clothing?: A Little ?Help from another person to put on and taking off regular lower body clothing?: A Lot ?6 Click Score: 15 ?  ?End of Session Nurse Communication: Mobility status ? ?Activity Tolerance: Patient limited by fatigue ?Patient left: in bed;with call bell/phone within reach;with family/visitor present;with bed alarm set ? ?OT Visit Diagnosis: Muscle weakness (generalized) (M62.81)  ?              ?Time: 1346-1400 ?OT Time Calculation (min): 14 min ?Charges:  OT General Charges ?$OT Visit: 1 Visit ?OT Evaluation ?$OT Eval Low Complexity: 1 Low ? ?Sean Malinowski, OTR/L ?Acute Care Rehab Services  ?Office [phone] ?Pager: [phone]  ? ?[name] [name] ?10/01/2021, 3:52 PM ?

## 2021-10-02 ENCOUNTER — Inpatient Hospital Stay (HOSPITAL_COMMUNITY): Payer: Medicare Other

## 2021-10-02 DIAGNOSIS — I5032 Chronic diastolic (congestive) heart failure: Secondary | ICD-10-CM

## 2021-10-02 DIAGNOSIS — G9340 Encephalopathy, unspecified: Secondary | ICD-10-CM | POA: Diagnosis not present

## 2021-10-02 DIAGNOSIS — A419 Sepsis, unspecified organism: Secondary | ICD-10-CM | POA: Diagnosis not present

## 2021-10-02 DIAGNOSIS — R6520 Severe sepsis without septic shock: Secondary | ICD-10-CM | POA: Diagnosis not present

## 2021-10-02 LAB — BLOOD CULTURE ID PANEL (REFLEXED) - BCID2

## 2021-10-02 LAB — ECHOCARDIOGRAM COMPLETE
AR max vel: 0.46 cm2
AV Area VTI: 0.43 cm2
AV Area mean vel: 0.45 cm2
AV Mean grad: 33.5 mmHg
AV Peak grad: 51.3 mmHg
Ao pk vel: 3.58 m/s
Area-P 1/2: 4.36 cm2
Calc EF: 53.2 %
Height: 66 in
S' Lateral: 4.2 cm
Single Plane A2C EF: 49.3 %
Single Plane A4C EF: 52.2 %
Weight: 2924.18 oz

## 2021-10-02 LAB — BLOOD GAS, ARTERIAL
Acid-Base Excess: 3.7 mmol/L — ABNORMAL HIGH (ref 0.0–2.0)
Bicarbonate: 26.3 mmol/L (ref 20.0–28.0)
Drawn by: 331471
FIO2: 44 %
O2 Saturation: 89.3 %
Patient temperature: 37
pCO2 arterial: 33 mmHg (ref 32–48)
pH, Arterial: 7.51 — ABNORMAL HIGH (ref 7.35–7.45)
pO2, Arterial: 53 mmHg — ABNORMAL LOW (ref 83–108)

## 2021-10-02 LAB — BRAIN NATRIURETIC PEPTIDE: B Natriuretic Peptide: 334.4 pg/mL — ABNORMAL HIGH (ref 0.0–100.0)

## 2021-10-02 LAB — RENAL FUNCTION PANEL
Albumin: 2.5 g/dL — ABNORMAL LOW (ref 3.5–5.0)
Anion gap: 11 (ref 5–15)
BUN: 70 mg/dL — ABNORMAL HIGH (ref 8–23)
CO2: 27 mmol/L (ref 22–32)
Calcium: 7.9 mg/dL — ABNORMAL LOW (ref 8.9–10.3)
Chloride: 105 mmol/L (ref 98–111)
Creatinine, Ser: 2 mg/dL — ABNORMAL HIGH (ref 0.61–1.24)
GFR, Estimated: 30 mL/min — ABNORMAL LOW (ref >60)
Glucose, Bld: 159 mg/dL — ABNORMAL HIGH (ref 70–99)
Phosphorus: 4.5 mg/dL (ref 2.5–4.6)
Potassium: 3.7 mmol/L (ref 3.5–5.1)
Sodium: 143 mmol/L (ref 135–145)

## 2021-10-02 LAB — URINE CULTURE: Culture: 30000 — AB

## 2021-10-02 LAB — MAGNESIUM: Magnesium: 2.1 mg/dL (ref 1.7–2.4)

## 2021-10-02 LAB — PROCALCITONIN: Procalcitonin: 0.48 ng/mL

## 2021-10-02 MED ORDER — METHYLPREDNISOLONE SODIUM SUCC 40 MG IJ SOLR
40.0000 mg | Freq: Every day | INTRAMUSCULAR | Status: DC
  Administered 2021-10-03: 40 mg via INTRAVENOUS
  Filled 2021-10-02: qty 1

## 2021-10-02 MED ORDER — PERFLUTREN LIPID MICROSPHERE
1.0000 mL | INTRAVENOUS | Status: AC | PRN
  Administered 2021-10-02: 2 mL via INTRAVENOUS
  Filled 2021-10-02: qty 10

## 2021-10-02 MED ORDER — FUROSEMIDE 10 MG/ML IJ SOLN
20.0000 mg | Freq: Three times a day (TID) | INTRAMUSCULAR | Status: DC
  Administered 2021-10-02: 20 mg via INTRAVENOUS
  Filled 2021-10-02: qty 2

## 2021-10-02 MED ORDER — IPRATROPIUM-ALBUTEROL 0.5-2.5 (3) MG/3ML IN SOLN: 3.0000 mL | Freq: Four times a day (QID) | RESPIRATORY_TRACT | Status: DC

## 2021-10-02 MED ORDER — LINEZOLID 600 MG/300ML IV SOLN
600.0000 mg | Freq: Two times a day (BID) | INTRAVENOUS | Status: DC
  Administered 2021-10-02 – 2021-10-03 (×2): 600 mg via INTRAVENOUS
  Filled 2021-10-02 (×2): qty 300

## 2021-10-02 NOTE — Progress Notes (Signed)
Physical Therapy Treatment ?Patient Details ?Name: [name] [name] ?MRN: [id number] ?DOB: 15-Apr-1923 ?Today's Date: 10/02/2021 ? ? ?History of Present Illness 88 yo male admitted with COVID, NSTEMI, AMS, sepsis. Hx of CAD, CVA, TAVR-stenosis, AAA, polio, CKD ? ?  ?PT Comments  ? ? Progressing slowly with mobility. Pt continues to require Min-Mod A for mobility. He fatigues easily with activity. O2 91% on 6L [place] during session. He appears confused and requires repeated multimodal cueing for safe mobility. Son was present during session. Discussed d/c plan-family prefers d/c home once medically ready. Explained to son that pt will likely need increased assistance and increased supervision after discharge.  ?   ?Recommendations for follow up therapy are one component of a multi-disciplinary discharge planning process, led by the attending physician.  Recommendations may be updated based on patient status, additional functional criteria and insurance authorization. ? ?Follow Up Recommendations ? Home health PT (family declines placement/prefers home) ?  ?  ?Assistance Recommended at Discharge Frequent or constant Supervision/Assistance  ?Patient can return home with the following A lot of help with walking and/or transfers;A lot of help with bathing/dressing/bathroom;Assistance with cooking/housework;Assist for transportation;Help with stairs or ramp for entrance ?  ?Equipment Recommendations ? Rolling walker (2 wheels)  ?  ?Recommendations for Other Services   ? ? ?  ?Precautions / Restrictions Precautions ?Precautions: Fall ?Precaution Comments: monitor O2 ?Restrictions ?Weight Bearing Restrictions: No  ?  ? ?Mobility ? Bed Mobility ?Overal bed mobility: Needs Assistance ?Bed Mobility: Supine to Sit ?  ?  ?Supine to sit: Mod assist, HOB elevated ?  ?  ?General bed mobility comments: Assist for trunk and LEs. Increased time. Cues required. ?  ? ?Transfers ?Overall transfer level: Needs assistance ?Equipment used: Rolling  walker (2 wheels) ?Transfers: Sit to/from Stand, Bed to chair/wheelchair/BSC ?Sit to Stand: Min assist, From elevated surface ?  ?Step pivot transfers: Min assist ?  ?  ?  ?General transfer comment: Assist to power up, stabilize, control descent. Cues for safety, technique, hand placement. Increased time. Pt reports feeling weak and fatigued. Remained on [place] O2 ?  ? ?Ambulation/Gait ?Ambulation/Gait assistance: Min assist ?Gait Distance (Feet): 2 Feet ?Assistive device: Rolling walker (2 wheels) ?  ?  ?  ?  ?General Gait Details: Pt peformed some pre-gait marching x 3 reps each leg. He also took 2 steps forwared then 2 step backwards with RW. Remained on 6L HF [place]. Fatigues very easily. ? ? ?Stairs ?  ?  ?  ?  ?  ? ? ?Wheelchair Mobility ?  ? ?Modified Rankin (Stroke Patients Only) ?  ? ? ?  ?Balance Overall balance assessment: Needs assistance ?  ?  ?  ?  ?Standing balance support: Bilateral upper extremity supported, During functional activity, Reliant on assistive device for balance ?Standing balance-Leahy Scale: Poor ?  ?  ?  ?  ?  ?  ?  ?  ?  ?  ?  ?  ?  ? ?  ?Cognition Arousal/Alertness: Awake/alert ?Behavior During Therapy: [facility] for tasks assessed/performed ?Overall Cognitive Status: Impaired/Different from baseline ?Area of Impairment: Problem solving, Following commands, Memory ?  ?  ?  ?  ?  ?  ?  ?  ?  ?  ?Memory: Decreased short-term memory ?Following Commands: Follows one step commands with increased time ?  ?  ?Problem Solving: Slow processing, Decreased initiation, Requires verbal cues, Difficulty sequencing, Requires tactile cues ?  ?  ?  ? ?  ?Exercises   ? ?  ?  General Comments   ?  ?  ? ?Pertinent Vitals/Pain Pain Assessment ?Pain Assessment: Faces ?Faces Pain Scale: No hurt  ? ? ?Home Living   ?  ?  ?  ?  ?  ?  ?  ?  ?  ?   ?  ?Prior Function    ?  ?  ?   ? ?PT Goals (current goals can now be found in the care plan section) Progress towards PT goals: Progressing toward goals ? ?  ?Frequency ? ? ? Min  2X/week ? ? ? ?  ?PT Plan Discharge plan needs to be updated  ? ? ?Co-evaluation   ?  ?  ?  ?  ? ?  ?AM-PAC PT "6 Clicks" Mobility   ?Outcome Measure ? Help needed turning from your back to your side while in a flat bed without using bedrails?: A Lot ?Help needed moving from lying on your back to sitting on the side of a flat bed without using bedrails?: A Lot ?Help needed moving to and from a bed to a chair (including a wheelchair)?: A Little ?Help needed standing up from a chair using your arms (e.g., wheelchair or bedside chair)?: A Little ?Help needed to walk in hospital room?: A Lot ?Help needed climbing 3-5 steps with a railing? : Total ?6 Click Score: 13 ? ?  ?End of Session Equipment Utilized During Treatment: Oxygen ?Activity Tolerance: Patient limited by fatigue ?Patient left: in chair;with call bell/phone within reach;with chair alarm set;with family/visitor present ?  ?PT Visit Diagnosis: Muscle weakness (generalized) (M62.81);Difficulty in walking, not elsewhere classified (R26.2);Pain ?  ? ? ?Time: 3875-6433 ?PT Time Calculation (min) (ACUTE ONLY): 26 min ? ?Charges:  $Gait Training: 23-37 mins          ?          ? ? ? ? ?Doreatha Massed, PT ?Acute Rehabilitation  ?Office: [phone] ?Pager: [phone] ?  ? ?

## 2021-10-02 NOTE — Progress Notes (Signed)
PHARMACY - PHYSICIAN COMMUNICATION ?CRITICAL VALUE ALERT - BLOOD CULTURE IDENTIFICATION (BCID) ? ?[name] [name] is an 88 y.o. male who presented to [facility] on 10/19/2021 with a chief complaint of weakness, altered mental status and fever. Concern for multifocal pneumonia s/t Covid and likely concomitant bacterial infection. ? ?Assessment:  1/4 (anaerobic) staph species ? ?Name of physician (or Provider) Contacted: Lovey Newcomer, NP ? ?Current antibiotics: cefepime ? ?Changes to prescribed antibiotics recommended: none, most like contaminant. Vancomycin discontinued 3/12 for negative MRSA PCR. Continue current antibiotics for now. ? ?Results for orders placed or performed during the hospital encounter of 10/04/2021  ?Blood Culture ID Panel (Reflexed) (Collected: 09/24/2021  9:34 PM)  ?Result Value Ref Range  ? Enterococcus faecalis NOT DETECTED NOT DETECTED  ? Enterococcus Faecium NOT DETECTED NOT DETECTED  ? Listeria monocytogenes NOT DETECTED NOT DETECTED  ? Staphylococcus species DETECTED (A) NOT DETECTED  ? Staphylococcus aureus (BCID) NOT DETECTED NOT DETECTED  ? Staphylococcus epidermidis NOT DETECTED NOT DETECTED  ? Staphylococcus lugdunensis NOT DETECTED NOT DETECTED  ? Streptococcus species NOT DETECTED NOT DETECTED  ? Streptococcus agalactiae NOT DETECTED NOT DETECTED  ? Streptococcus pneumoniae NOT DETECTED NOT DETECTED  ? Streptococcus pyogenes NOT DETECTED NOT DETECTED  ? A.calcoaceticus-baumannii NOT DETECTED NOT DETECTED  ? Bacteroides fragilis NOT DETECTED NOT DETECTED  ? Enterobacterales NOT DETECTED NOT DETECTED  ? Enterobacter cloacae complex NOT DETECTED NOT DETECTED  ? Escherichia coli NOT DETECTED NOT DETECTED  ? Klebsiella aerogenes NOT DETECTED NOT DETECTED  ? Klebsiella oxytoca NOT DETECTED NOT DETECTED  ? Klebsiella pneumoniae NOT DETECTED NOT DETECTED  ? Proteus species NOT DETECTED NOT DETECTED  ? Salmonella species NOT DETECTED NOT DETECTED  ? Serratia marcescens NOT DETECTED NOT DETECTED   ? Haemophilus influenzae NOT DETECTED NOT DETECTED  ? Neisseria meningitidis NOT DETECTED NOT DETECTED  ? Pseudomonas aeruginosa NOT DETECTED NOT DETECTED  ? Stenotrophomonas maltophilia NOT DETECTED NOT DETECTED  ? Candida albicans NOT DETECTED NOT DETECTED  ? Candida auris NOT DETECTED NOT DETECTED  ? Candida glabrata NOT DETECTED NOT DETECTED  ? Candida krusei NOT DETECTED NOT DETECTED  ? Candida parapsilosis NOT DETECTED NOT DETECTED  ? Candida tropicalis NOT DETECTED NOT DETECTED  ? Cryptococcus neoformans/gattii NOT DETECTED NOT DETECTED  ? ? ?Tawnya Crook, PharmD, BCPS ?Clinical Pharmacist ?10/02/2021 6:20 AM ? ? ?

## 2021-10-02 NOTE — Progress Notes (Signed)
Called and updated the patient's son via phone.  Called his sister too at his request.  No answer.  Left a voicemail message. ?

## 2021-10-02 NOTE — Progress Notes (Signed)
PROGRESS NOTE  BOBAK OGUINN FGH:[id number] DOB: 06-05-1923 DOA: 09/26/2021 PCP: Patient, No Pcp Per (Inactive)  HPI/Recap of past 24 hours: [name] [name] is a 88 y.o. male with medical history significant of CAD, HTN, hx of CVA, s/p TAVR, HLD, hypothyroidism, CKD 3B who presents from home to [facility] with complaints of generalized weakness, altered mental status, and fever.  Patient lives alone, no reported dementia, family lives nearby to assist.    Work-up revealed elevated troponin, peaked at 348 and down trended, sepsis secondary to multifocal pneumonia, in the setting of COVID-19 viral infection and possibly superimposed by bacterial coinfection.  He was started on broad-spectrum IV antibiotics, cefepime and IV vancomycin.  Patient's son has declined antivirals for COVID-19 infection.  MRSA screening test negative, IV vancomycin discontinued on 10/01/2021.     10/02/2021: Patient was seen at his bedside.  He is somnolent this morning but arousable to voices.  Denies having any chest pain.   Assessment/Plan: Principal Problem:   Sepsis ([place]) Active Problems:   NSTEMI (non-ST elevated myocardial infarction) ([place])   Acute kidney injury superimposed on chronic kidney disease (HCC)   Chronic heart failure with preserved ejection fraction (HFpEF) ([place])   Essential hypertension   Coronary artery disease s/p Inf MI, stent to LCx, known CTO of RCA   S/P TAVR (transcatheter aortic valve replacement)   Prolonged QT interval  Sepsis ([place]) secondary to multifocal pneumonia, POA. Post reviewed CT chest without contrast done on 09/30/1941, it showed bilateral pulmonary infiltrates. In the setting of COVID-19 viral infection and possibly superimposed by bacterial coinfection.  He was started on broad-spectrum IV antibiotics, cefepime and IV vancomycin.  Patient's son has declined antivirals for COVID-19 infection.  MRSA screening test negative, IV vancomycin discontinued on 10/01/2021.   Continue to  monitor fever curve and WBC.    Acute hypoxic respiratory failure secondary to multifocal pneumonia, COVID-19 viral infection. Wean off IV Solu-Medrol 40 mg twice daily on 10/01/2021> IV Solu-Medrol 40 mg daily. Continue Combivent every 6 hours Incentive spirometer and flutter valve Antitussives Tessalon Perles 3 times daily x3 days. Maintain oxygen saturation greater than 92% Out of bed to chair, mobilize as tolerated Obtain ABG on 10/02/2021  Positive urine culture Urine culture taken on 09/28/2021 positive for Enterococcus faecalis IV vancomycin was discontinued on 10/01/2021.  Positive blood culture, possible contaminant Gram-positive cocci isolated from anaerobic bottle only from blood cultures taken on 10/03/2021  Elevated troponin, suspect demand ischemia in the setting of acute hypoxic respiratory failure. Troponin peaked at 340 and down trended. No chest pain at the time of this visit. 2D echo is pending. Continue home Lopressor and home aspirin. Continue to closely monitor on telemetry.  COVID-19 viral infection COVID-19 screening test positive on 10/15/2021, in isolation x10 days. Inflammatory markers are pending. Monitor fever curve and WBC. Patient's son has declined antibiotics. Vitamin D3, zinc, vitamin C.  Acute kidney injury superimposed on chronic kidney disease 3B ([place]) Off IV fluid 10/02/2021 Continue to monitor renal function and electrolytes Creatinine downtrending 2.0 from 2.3 to, from 2.52.   Chronic heart failure with preserved ejection fraction (HFpEF) (HCC) No pulmonary edema on CXR.  Continue demadex and metoprolol Follow 2D echo ordered on 10/01/2021. Continue to monitor strict I's and O's and daily weight.   Essential hypertension BP is stable. Continue home Lopressor and torsemide. Continue to closely monitor vital signs.   Coronary artery disease s/p Inf MI, stent to LCx, known CTO of RCA No anginal symptoms at  the time of this visit.   S/P  TAVR (transcatheter aortic valve replacement) stable   Prolonged QT interval Continue to avoid medications which could further prolong QT interval Continue to optimize magnesium and potassium levels  Physical debility PT OT to assess recommended home health PT OT. Fall precautions.   Critical care time: 65 minutes.    Advance Care Planning:   Code Status:  DNR. Confirmed code status with son at bedside. Heparin for DVT prophylaxis.    Consults: Cardiology consulted by ER PA and will see in am   Family Communication: None at bedside.   Antimicrobials: Cefepime IV vancomycin DC'd on 10/01/2021.  DVT prophylaxis: SQ heparin 3 times daily.  Status is: Inpatient Patient requires at least 2 midnights for further evaluation and treatment of present condition.    Objective: Vitals:   10/01/21 2103 10/02/21 0535 10/02/21 1037 10/02/21 1229  BP: (!) 142/65 140/79 (!) 150/68 (!) 126/59  Pulse: 61 (!) 58 63 64  Resp: 18   20  Temp: 98 F (36.7 C) 97.7 F (36.5 C)  97.9 F (36.6 C)  TempSrc: Oral Oral  Oral  SpO2: 93% 94%  92%  Weight:  82.9 kg    Height:        Intake/Output Summary (Last 24 hours) at 10/02/2021 1255 Last data filed at 10/02/2021 0549 Gross per 24 hour  Intake 2115.35 ml  Output 1651 ml  Net 464.35 ml   Filed Weights   09/30/21 0319 10/01/21 0319 10/02/21 0535  Weight: 84.1 kg 84.1 kg 82.9 kg    Exam:  General: 88 y.o. year-old male frail-appearing in no acute distress.  He is somnolent but easily arousable to voices.    Cardiovascular: Regular rate and rhythm no rubs or gallops. Respiratory: Mild rales at bases.  Mild wheezing noted.  Poor inspiratory effort.  Abdomen: Soft nontender bowel sounds present.   Musculoskeletal: Trace lower extremity edema bilaterally. Skin: No ulcerative lesions noted. Psychiatry: Mood is appropriate for condition and setting. Neuro: Alert and awake.   Data Reviewed: CBC: Recent Labs  Lab 10/02/2021 2134  09/30/21 0343 10/01/21 1129  WBC 6.2 5.4 6.6  NEUTROABS 5.3  --  5.7  HGB 16.7 14.8 14.8  HCT 51.7 45.6 45.2  MCV 99.0 100.0 98.9  PLT 124* 114* 850*   Basic Metabolic Panel: Recent Labs  Lab 10/14/2021 2134 09/30/21 0343 10/01/21 1129 10/02/21 0630  NA 141 141 141 143  K 4.4 4.1 3.7 3.7  CL 104 105 106 105  CO2 '24 25 27 27  '$ GLUCOSE 143* 133* 135* 159*  BUN 53* 57* 65* 70*  CREATININE 2.23* 2.52* 2.32* 2.00*  CALCIUM 8.4* 7.9* 7.8* 7.9*  MG  --   --  2.2 2.1  PHOS  --   --  4.2 4.5   GFR: Estimated Creatinine Clearance: 21.8 mL/min (A) (by C-G formula based on SCr of 2 mg/dL (H)). Liver Function Tests: Recent Labs  Lab 09/20/2021 2134 09/30/21 0343 10/01/21 1129 10/02/21 0630  AST 57* 50* 72*  --   ALT '22 20 20  '$ --   ALKPHOS 83 65 63  --   BILITOT 0.8 0.8 0.7  --   PROT 7.0 5.8* 5.7*  --   ALBUMIN 3.4* 2.8* 2.7* 2.5*   No results for input(s): LIPASE, AMYLASE in the last 168 hours. No results for input(s): AMMONIA in the last 168 hours. Coagulation Profile: Recent Labs  Lab 10/10/2021 2134  INR 1.0   Cardiac Enzymes: No  results for input(s): CKTOTAL, CKMB, CKMBINDEX, TROPONINI in the last 168 hours. BNP (last 3 results) No results for input(s): PROBNP in the last 8760 hours. HbA1C: No results for input(s): HGBA1C in the last 72 hours. CBG: No results for input(s): GLUCAP in the last 168 hours. Lipid Profile: No results for input(s): CHOL, HDL, LDLCALC, TRIG, CHOLHDL, LDLDIRECT in the last 72 hours. Thyroid Function Tests: No results for input(s): TSH, T4TOTAL, FREET4, T3FREE, THYROIDAB in the last 72 hours. Anemia Panel: No results for input(s): VITAMINB12, FOLATE, FERRITIN, TIBC, IRON, RETICCTPCT in the last 72 hours. Urine analysis:    Component Value Date/Time   COLORURINE YELLOW 09/25/2021 2133   APPEARANCEUR HAZY (A) 10/08/2021 2133   LABSPEC 1.009 10/10/2021 2133   PHURINE 5.0 10/14/2021 2133   GLUCOSEU NEGATIVE 09/22/2021 2133   HGBUR  MODERATE (A) 09/25/2021 2133   BILIRUBINUR NEGATIVE 10/17/2021 2133   KETONESUR NEGATIVE 10/06/2021 2133   PROTEINUR NEGATIVE 09/26/2021 2133   UROBILINOGEN 0.2 03/26/2014 1507   NITRITE NEGATIVE 10/02/2021 2133   LEUKOCYTESUR NEGATIVE 10/20/2021 2133   Sepsis Labs: '@LABRCNTIP'$ (procalcitonin:4,lacticidven:4)  ) Recent Results (from the past 240 hour(s))  Blood Culture (routine x 2)     Status: None (Preliminary result)   Collection Time: 10/09/2021  9:34 PM   Specimen: BLOOD  Result Value Ref Range Status   Specimen Description   Final    BLOOD RIGHT ANTECUBITAL Performed at [facility], [place] [address] [place], [place] [address]    Special Requests   Final    BOTTLES DRAWN AEROBIC AND ANAEROBIC Blood Culture adequate volume Performed at [place] [address], [place], [place] [address]    Culture  Setup Time   Final    GRAM POSITIVE COCCI ANAEROBIC BOTTLE ONLY CRITICAL RESULT CALLED TO, READ BACK BY AND VERIFIED WITH: PHARMD ABBEY ELLINGTON 10/02/21'@6'$ :12 BY TW    Culture   Final    NO GROWTH 3 DAYS Performed at [facility], [place] [address] [place], [place] [address]    Report Status PENDING  Incomplete  Urine Culture     Status: Abnormal   Collection Time: 10/14/2021  9:34 PM   Specimen: In/Out Cath Urine  Result Value Ref Range Status   Specimen Description   Final    IN/OUT CATH URINE Performed at [facility] [address], [place], [place] [address]    Special Requests   Final    NONE Performed at [facility], [place] [address] [place], [place] [address]    Culture 30,000 COLONIES/mL ENTEROCOCCUS FAECALIS (A)  Final   Report Status 10/02/2021 FINAL  Final   Organism ID, Bacteria ENTEROCOCCUS FAECALIS (A)  Final      Susceptibility   Enterococcus faecalis - MIC*    AMPICILLIN <=2 SENSITIVE Sensitive     NITROFURANTOIN <=16 SENSITIVE Sensitive     VANCOMYCIN 1 SENSITIVE  Sensitive     * 30,000 COLONIES/mL ENTEROCOCCUS FAECALIS  Blood Culture ID Panel (Reflexed)     Status: Abnormal   Collection Time: 10/02/2021  9:34 PM  Result Value Ref Range Status   Enterococcus faecalis NOT DETECTED NOT DETECTED Final   Enterococcus Faecium NOT DETECTED NOT DETECTED Final   Listeria monocytogenes NOT DETECTED NOT DETECTED Final   Staphylococcus species DETECTED (A) NOT DETECTED Final    Comment: CRITICAL RESULT CALLED TO, READ BACK BY AND VERIFIED WITH: PHARMD ABBEY ELLINGTON 10/02/21'@6'$ :12 BY TW    Staphylococcus aureus (BCID) NOT  DETECTED NOT DETECTED Final   Staphylococcus epidermidis NOT DETECTED NOT DETECTED Final   Staphylococcus lugdunensis NOT DETECTED NOT DETECTED Final   Streptococcus species NOT DETECTED NOT DETECTED Final   Streptococcus agalactiae NOT DETECTED NOT DETECTED Final   Streptococcus pneumoniae NOT DETECTED NOT DETECTED Final   Streptococcus pyogenes NOT DETECTED NOT DETECTED Final   A.calcoaceticus-baumannii NOT DETECTED NOT DETECTED Final   Bacteroides fragilis NOT DETECTED NOT DETECTED Final   Enterobacterales NOT DETECTED NOT DETECTED Final   Enterobacter cloacae complex NOT DETECTED NOT DETECTED Final   Escherichia coli NOT DETECTED NOT DETECTED Final   Klebsiella aerogenes NOT DETECTED NOT DETECTED Final   Klebsiella oxytoca NOT DETECTED NOT DETECTED Final   Klebsiella pneumoniae NOT DETECTED NOT DETECTED Final   Proteus species NOT DETECTED NOT DETECTED Final   Salmonella species NOT DETECTED NOT DETECTED Final   Serratia marcescens NOT DETECTED NOT DETECTED Final   Haemophilus influenzae NOT DETECTED NOT DETECTED Final   Neisseria meningitidis NOT DETECTED NOT DETECTED Final   Pseudomonas aeruginosa NOT DETECTED NOT DETECTED Final   Stenotrophomonas maltophilia NOT DETECTED NOT DETECTED Final   Candida albicans NOT DETECTED NOT DETECTED Final   Candida auris NOT DETECTED NOT DETECTED Final   Candida glabrata NOT DETECTED NOT  DETECTED Final   Candida krusei NOT DETECTED NOT DETECTED Final   Candida parapsilosis NOT DETECTED NOT DETECTED Final   Candida tropicalis NOT DETECTED NOT DETECTED Final   Cryptococcus neoformans/gattii NOT DETECTED NOT DETECTED Final    Comment: Performed at [facility], [address], [place], [place] [address]  Blood Culture (routine x 2)     Status: None (Preliminary result)   Collection Time: 09/28/2021 10:45 PM   Specimen: BLOOD  Result Value Ref Range Status   Specimen Description   Final    BLOOD LEFT ANTECUBITAL Performed at [facility] [address], [place], [place] [address]    Special Requests   Final    BOTTLES DRAWN AEROBIC AND ANAEROBIC Blood Culture adequate volume Performed at [place] [address] [place], [place] [address]    Culture   Final    NO GROWTH 2 DAYS Performed at [place] [address], [place], [place] [address]    Report Status PENDING  Incomplete  Resp Panel by RT-PCR (Flu A&B, Covid)     Status: Abnormal   Collection Time: 09/21/2021 11:08 PM   Specimen: Nasopharyngeal(NP) swabs in vial transport medium  Result Value Ref Range Status   SARS Coronavirus 2 by RT PCR POSITIVE (A) NEGATIVE Final    Comment: (NOTE) SARS-CoV-2 target nucleic acids are DETECTED.  The SARS-CoV-2 RNA is generally detectable in upper respiratory specimens during the acute phase of infection. Positive results are indicative of the presence of the identified virus, but do not rule out bacterial infection or co-infection with other pathogens not detected by the test. Clinical correlation with patient history and other diagnostic information is necessary to determine patient infection status. The expected result is Negative.  Fact Sheet for Patients: EntrepreneurPulse.com.au  Fact Sheet for Healthcare Providers: IncredibleEmployment.be  This test is not yet approved  or cleared by the [place] FDA and  has been authorized for detection and/or diagnosis of SARS-CoV-2 by FDA under an Emergency Use Authorization (EUA).  This EUA will remain in effect (meaning this test can be used) for the duration of  the COVID-19 declaration under Section 564(b)(1) of the A ct, 21 U.S.C.  section 360bbb-3(b)(1), unless the authorization is terminated or revoked sooner.     Influenza A by PCR NEGATIVE NEGATIVE Final   Influenza B by PCR NEGATIVE NEGATIVE Final    Comment: (NOTE) The Xpert Xpress SARS-CoV-2/FLU/RSV plus assay is intended as an aid in the diagnosis of influenza from Nasopharyngeal swab specimens and should not be used as a sole basis for treatment. Nasal washings and aspirates are unacceptable for Xpert Xpress SARS-CoV-2/FLU/RSV testing.  Fact Sheet for Patients: EntrepreneurPulse.com.au  Fact Sheet for Healthcare Providers: IncredibleEmployment.be  This test is not yet approved or cleared by the [place] FDA and has been authorized for detection and/or diagnosis of SARS-CoV-2 by FDA under an Emergency Use Authorization (EUA). This EUA will remain in effect (meaning this test can be used) for the duration of the COVID-19 declaration under Section 564(b)(1) of the Act, 21 U.S.C. section 360bbb-3(b)(1), unless the authorization is terminated or revoked.  Performed at [facility], [place] [address] [place], [place] [address]   MRSA Next Gen by PCR, Nasal     Status: None   Collection Time: 09/30/21  1:00 PM   Specimen: Nasal Mucosa; Nasal Swab  Result Value Ref Range Status   MRSA by PCR Next Gen NOT DETECTED NOT DETECTED Final    Comment: (NOTE) The GeneXpert MRSA Assay (FDA approved for NASAL specimens only), is one component of a comprehensive MRSA colonization surveillance program. It is not intended to diagnose MRSA infection nor to guide or monitor treatment for MRSA  infections. Test performance is not FDA approved in patients less than 48 years old. Performed at [facility], [place] [address], [place], [place] [address]       Studies: No results found.  Scheduled Meds:  vitamin C  500 mg Oral Daily   aspirin  81 mg Oral Daily   benzonatate  200 mg Oral TID   cholecalciferol  1,000 Units Oral Daily   finasteride  5 mg Oral Daily   heparin  5,000 Units Subcutaneous Q8H   Ipratropium-Albuterol  1 puff Inhalation Q6H   levothyroxine  50 mcg Oral Q0600   methylPREDNISolone (SOLU-MEDROL) injection  40 mg Intravenous BID   metoprolol tartrate  25 mg Oral BID   multivitamin  1 tablet Oral BID   PARoxetine  10 mg Oral Daily   potassium chloride  20 mEq Oral Daily   torsemide  20 mg Oral BID   zinc sulfate  220 mg Oral Daily    Continuous Infusions:  ceFEPime (MAXIPIME) IV Stopped (10/02/21 0522)     LOS: 2 days     Kayleen Memos, MD [facility] Pager [phone]  If 7PM-7AM, please contact night-coverage [email or web address] Password [facility] 10/02/2021, 12:55 PM

## 2021-10-03 DIAGNOSIS — A419 Sepsis, unspecified organism: Secondary | ICD-10-CM | POA: Diagnosis not present

## 2021-10-03 DIAGNOSIS — G9340 Encephalopathy, unspecified: Secondary | ICD-10-CM | POA: Diagnosis not present

## 2021-10-03 DIAGNOSIS — R6520 Severe sepsis without septic shock: Secondary | ICD-10-CM | POA: Diagnosis not present

## 2021-10-03 LAB — POTASSIUM: Potassium: 3.7 mmol/L (ref 3.5–5.1)

## 2021-10-03 LAB — BLOOD GAS, ARTERIAL
Acid-base deficit: 0.2 mmol/L (ref 0.0–2.0)
Bicarbonate: 22.6 mmol/L (ref 20.0–28.0)
O2 Saturation: 95.5 %
Patient temperature: 37
pCO2 arterial: 31 mmHg — ABNORMAL LOW (ref 32–48)
pH, Arterial: 7.47 — ABNORMAL HIGH (ref 7.35–7.45)
pO2, Arterial: 69 mmHg — ABNORMAL LOW (ref 83–108)

## 2021-10-03 LAB — BRAIN NATRIURETIC PEPTIDE: B Natriuretic Peptide: 394 pg/mL — ABNORMAL HIGH (ref 0.0–100.0)

## 2021-10-03 LAB — PROCALCITONIN: Procalcitonin: 0.38 ng/mL

## 2021-10-03 LAB — MAGNESIUM: Magnesium: 2.4 mg/dL (ref 1.7–2.4)

## 2021-10-03 LAB — PHOSPHORUS: Phosphorus: 3.8 mg/dL (ref 2.5–4.6)

## 2021-10-03 LAB — D-DIMER, QUANTITATIVE: D-Dimer, Quant: 3.61 ug/mL-FEU — ABNORMAL HIGH (ref 0.00–0.50)

## 2021-10-03 MED ORDER — MELATONIN 3 MG PO TABS
3.0000 mg | ORAL_TABLET | Freq: Every day | ORAL | Status: DC
  Filled 2021-10-03: qty 1

## 2021-10-03 MED ORDER — FUROSEMIDE 10 MG/ML IJ SOLN
20.0000 mg | Freq: Three times a day (TID) | INTRAMUSCULAR | Status: DC
  Administered 2021-10-03 (×3): 20 mg via INTRAVENOUS
  Filled 2021-10-03 (×3): qty 2

## 2021-10-03 MED ORDER — SODIUM CHLORIDE 0.9 % IV SOLN: INTRAVENOUS | Status: DC | PRN

## 2021-10-03 MED ORDER — ADULT MULTIVITAMIN W/MINERALS CH: 1.0000 | ORAL_TABLET | Freq: Every day | ORAL | Status: DC

## 2021-10-03 MED ORDER — LORAZEPAM 1 MG PO TABS: 1.0000 mg | ORAL_TABLET | ORAL | Status: DC | PRN

## 2021-10-03 MED ORDER — THIAMINE HCL 100 MG/ML IJ SOLN
100.0000 mg | Freq: Every day | INTRAMUSCULAR | Status: DC
  Administered 2021-10-03: 100 mg via INTRAVENOUS
  Filled 2021-10-03: qty 2

## 2021-10-03 MED ORDER — FOLIC ACID 1 MG PO TABS: 1.0000 mg | ORAL_TABLET | Freq: Every day | ORAL | Status: DC

## 2021-10-03 MED ORDER — FOSFOMYCIN TROMETHAMINE 3 G PO PACK
3.0000 g | PACK | Freq: Once | ORAL | Status: AC
  Administered 2021-10-03: 3 g via ORAL
  Filled 2021-10-03 (×2): qty 3

## 2021-10-03 MED ORDER — LORAZEPAM 2 MG/ML IJ SOLN
1.0000 mg | INTRAMUSCULAR | Status: DC | PRN
  Administered 2021-10-03 – 2021-10-04 (×6): 2 mg via INTRAVENOUS
  Filled 2021-10-03 (×6): qty 1

## 2021-10-03 MED ORDER — THIAMINE HCL 100 MG PO TABS: 100.0000 mg | ORAL_TABLET | Freq: Every day | ORAL | Status: DC

## 2021-10-03 MED ORDER — METOPROLOL TARTRATE 5 MG/5ML IV SOLN: 2.5000 mg | Freq: Once | INTRAVENOUS | Status: DC | PRN

## 2021-10-03 MED ORDER — PREDNISONE 20 MG PO TABS
40.0000 mg | ORAL_TABLET | Freq: Every day | ORAL | Status: DC
  Filled 2021-10-03: qty 2

## 2021-10-03 MED ORDER — LORAZEPAM 2 MG/ML IJ SOLN
0.5000 mg | INTRAMUSCULAR | Status: AC
Start: 2021-10-03 — End: 2021-10-03
  Administered 2021-10-03: 0.5 mg via INTRAVENOUS
  Filled 2021-10-03: qty 1

## 2021-10-03 NOTE — Progress Notes (Addendum)
0040 PT attempting to get out of bed, the alarm did not go off at that time, pt was sideways in the bed feet over the side rail, pt activated the call bell somehow.  Pt had incontinent BM.  RN and NT were able to get pt up to [place] 1 assist with walker to get washed up and get the linen changed.  PT kept O2 on while up to [facility], pt with notable DOE with any exertion.  PT almost immediately took O2 off when we got him settled back into the bed ?0100 pt with several attempts to get out of bed, very restless.PT refuses to leave oxygen in nose.  O2 83% at best of Room Air, gets agitated with RN when she tried to put O2 back in nose.  Pt visibly winded with any activity. Pt very restless and impulsive (trying to get out of bed,compared to previous night to which pt appeared to be asleep all night) but he unable to verbalize what he needs or wants, RN unsure if this is because he cannot hear no matter how loud or how close to his ear RN is.  PT is slow to respond to to verbal commands, simply saying 'yes' or 'no' to everything RN says ?0300 RN, NT, and Charge all attempted to keep pt in bed with oxygen on, hard to redirect. Pt eventually wore out, still wouldn't keep oxygen on..but was situated in the bed alarm on.   ?RN reached out to MD on call about this, not a candidate for medication d/t long QT, not a good candidate for tele sitter as he did not respond well to persons in the room as he is very HOH and noted to have some AMS, not a candidate for physical restraints (RN fears he would hurt himself, as he shredded/tore open mitts over the weekend) ?

## 2021-10-03 NOTE — Progress Notes (Signed)
End of shift ? ?This morning pt 81% on RA.  Dr Nevada Crane notified.  Pt would NOT leave oxygen in place.  [place] placed starting at 10L and restraint order received and started at 10am.  Daughter notified and arrived after 1300.  Daughter inquired if we had assisted the pt to the chair today.  Advised Daughter that when a pt is in restraints, we will keep the pt in the bed.   ? ?Daughter wanted something given to her dad for his restlessness.  Dr [place] Crane ordered an EKG which showed a prolonged QTC and 0.5 ativan was given.  Pt's oxygen turned up to 12L.  Pt continued his restlessness and Dr Nevada Crane ordered CIWA precautions.  Pt scored a 15 and received '2mg'$  Ativan.  Pt unable to answer orientation questions.   ? ?Do not anticipate pt discharging tomorrow. ?  ? ?

## 2021-10-03 NOTE — Progress Notes (Signed)
? ?PROGRESS NOTE ? ?[name] [name] Manfred KKX:[id number] DOB: 1923-03-02 DOA: 10/06/2021 ?PCP: Patient, No Pcp Per (Inactive) ? ?HPI/Recap of past 24 hours: ?[name] [name] is a 88 y.o. male with medical history significant of CAD, HTN, hx of CVA, s/p TAVR, HLD, hypothyroidism, CKD 3B who presents from home to [facility] with complaints of generalized weakness, altered mental status, and fever.  Patient lives alone, no reported dementia, family lives nearby to assist.   ? ?Work-up revealed elevated troponin, peaked at 348 and down trended, sepsis secondary to multifocal pneumonia, in the setting of COVID-19 viral infection and likely superimposed by bacterial coinfection.  He was started on broad-spectrum IV antibiotics, cefepime and IV vancomycin.  Patient's son has declined antivirals for COVID-19 viral infection.  MRSA screening test negative, IV vancomycin discontinued on 10/01/2021.  Received a dose of Linezolid.  Fosfomycin 3 g x1 for possible enterococcus faecalis UTI. ?  ?10/03/2021: Patient was seen and examined at his bedside.  He is agitated and refusing to wear his oxygen supplementation.  O2 saturation dropped to 80-81% on room air.  Soft restraints applied for patient's own safety. ? ? ?Assessment/Plan: ?Principal Problem: ?  Sepsis ([place]) ?Active Problems: ?  NSTEMI (non-ST elevated myocardial infarction) ([place]) ?  Acute kidney injury superimposed on chronic kidney disease ([place]) ?  Chronic heart failure with preserved ejection fraction (HFpEF) ([place]) ?  Essential hypertension ?  Coronary artery disease s/p Inf MI, stent to LCx, known CTO of RCA ?  S/P TAVR (transcatheter aortic valve replacement) ?  Prolonged QT interval ? ?Sepsis ([place]) secondary to multifocal pneumonia, POA. ?Personally reviewed CT chest without contrast done on 09/30/1941, it showed bilateral pulmonary infiltrates. ?In the setting of COVID-19 viral infection and likely superimposed by bacterial coinfection.  He was started on broad-spectrum IV  antibiotics, cefepime and IV vancomycin.  Patient's son has declined antivirals for COVID-19 infection.  MRSA screening test negative, IV vancomycin discontinued on 10/01/2021.  Received a dose of linezolid on 10/02/2021. ?Continue to monitor fever curve and WBC.   ?Procalcitonin is downtrending. ? ?Persistent acute hypoxic respiratory failure secondary to multifocal pneumonia, COVID-19 viral infection. ?Not on oxygen supplementation at baseline. ?Wean off IV Solu-Medrol 40 mg twice daily on 10/01/2021> IV Solu-Medrol 40 mg daily>> switch to prednisone on 10/04/2021 x 2 days. ?Continue bronchodilators and pulmonary toilet. ?Incentive spirometer and flutter valve ?Antitussives Tessalon Perles 3 times daily x3 days. ?Maintain oxygen saturation greater than 92% ?Out of bed to chair, mobilize as tolerated ?PaO2 on ABG slightly improving 69 from 53 ? ?Acute metabolic encephalopathy, suspect delirium in the setting of acute illness ?Delirium management, nonpharmacological and pharmacological. ?Obtain twelve-lead EKG to rule out QTc prolongation prior to starting Seroquel 12.5 mg twice daily. ?Start melatonin nightly to regulate sleep and wake cycle. ?Delirium/fall/aspiration precautions ?Open blinds during the day, reorient frequently, out of bed to chair during the day with fall precautions. ?Continue to treat underlying conditions ? ?Elevated troponin, suspect demand ischemia in the setting of acute hypoxic respiratory failure. ?Abnormal 2D echo done on 10/02/2021. ?Troponin peaked at 340 and down trended. ?No chest pain at the time of this visit. ?2D echo normal LVEF 50 to 55% with grade 1 diastolic dysfunction.  Moderate concentric left ventricular hypertrophy.  Global hypokinesis. ?Continue home Lopressor and home aspirin. ?Continue to closely monitor on telemetry. ?Cardiology consulted. ? ?Possible Enterococcus faecalis UTI, POA ?Urine culture taken on 10/07/2021 positive for Enterococcus faecalis ?IV vancomycin was  discontinued on 10/01/2021.  Received a dose  of linezolid on 10/02/2021. ?1 dose fosfomycin 10/03/2021 ? ?Positive blood culture, possible contaminant ?Gram-positive cocci isolated from anaerobic bottle only from blood cultures taken on 09/24/2021 ?Culture reincubated for better growth, follow results. ? ?COVID-19 viral infection ?COVID-19 screening test positive on 09/30/2021, in isolation x10 days. ?Trend inflammatory markers. ?Monitor fever curve and WBC. ?Patient's son has declined antivirals. ?Continue Vitamin D3, zinc, vitamin C. ? ?Elevated D-dimer ?Initial D-dimer 9.93 ?Repeat D-dimer, trend ? ?Acute kidney injury superimposed on chronic kidney disease 3B ([place]) ?Continue to monitor renal function and electrolytes ?Creatinine downtrending 2.0 from 2.3 to, from 2.52. ?  ?Chronic heart failure with preserved ejection fraction (HFpEF) ([place]) ?No pulmonary edema on CXR.  ?Continue home metoprolol ?Abnormal 2D echo obtained on 10/02/2021. ?BNP greater than 300 ?IV Lasix 20 mg 3 times daily x6 doses. ?Continue to monitor strict I's and O's and daily weight. ?Cardiology will see in consultation. ?  ?Essential hypertension ?BP is stable. ?Continue home Lopressor and gentle IV diuresing. ?Continue to closely monitor vital signs. ?  ?Coronary artery disease s/p Inf MI, stent to LCx, known CTO of RCA ?No anginal symptoms at the time of this visit. ?  ?S/P TAVR (transcatheter aortic valve replacement) ?stable ?  ?Prolonged QT interval ?Continue to avoid medications which could further prolong QT interval ?Continue to optimize magnesium and potassium levels ?Repeat 12 EKG on 10/03/2021. ? ?Physical debility ?PT OT to assess recommended home health PT OT. ?Fall precautions. ? ? ?Critical care time: 65 minutes. ? ?  ?Advance Care Planning:   Code Status:  DNR. Confirmed code status with son at bedside.  Subcu heparin 3 times daily for DVT prophylaxis.  ?  ?Consults: Cardiology ?  ?Family Communication: None at  bedside. ? ? ?Antimicrobials: ?Cefepime ?IV vancomycin DC'd on 10/01/2021. ? ?DVT prophylaxis: ?SQ heparin 3 times daily. ? ?Status is: Inpatient ?Patient requires at least 2 midnights for further evaluation and treatment of present condition. ? ? ? ?Objective: ?Vitals:  ? 10/03/21 0920 10/03/21 0943 10/03/21 1018 10/03/21 1328  ?BP:  107/65  125/65  ?Pulse:  86  79  ?Resp: (!) 32 (!) 24 (!) 27 18  ?Temp:    98.1 ?F (36.7 ?C)  ?TempSrc:    Oral  ?SpO2:  (!) 81% (!) 89% 93%  ?Weight:      ?Height:      ? ? ?Intake/Output Summary (Last 24 hours) at 10/03/2021 1431 ?Last data filed at 10/03/2021 0913 ?Gross per 24 hour  ?Intake 1014.15 ml  ?Output 700 ml  ?Net 314.15 ml  ? ?Filed Weights  ? 09/30/21 0319 10/01/21 0319 10/02/21 0535  ?Weight: 84.1 kg 84.1 kg 82.9 kg  ? ? ?Exam: ? ?General: 88 y.o. year-old male frail-appearing, confused and agitated. ? Cardiovascular: Regular rate and rhythm no rubs or gallops.   ?Respiratory: Mild rales at bases with poor inspiratory effort. ?Abdomen: Soft nontender normal bowel sounds present. ?Musculoskeletal: Trace lower extremity edema bilaterally. ?Skin: No ulcerative lesions seen. ?Psychiatry: Unable to assess mood due to confusion. ?Neuro: Alert and confused, agitated. ? ? ?Data Reviewed: ?CBC: ?Recent Labs  ?Lab 10/20/2021 ?2134 09/30/21 ?1751 10/01/21 ?1129  ?WBC 6.2 5.4 6.6  ?NEUTROABS 5.3  --  5.7  ?HGB 16.7 14.8 14.8  ?HCT 51.7 45.6 45.2  ?MCV 99.0 100.0 98.9  ?PLT 124* 114* 124*  ? ?Basic Metabolic Panel: ?Recent Labs  ?Lab 09/20/2021 ?2134 09/30/21 ?0258 10/01/21 ?1129 10/02/21 ?0630 10/03/21 ?5277  ?NA 141 141 141 143  --   ?K  4.4 4.1 3.7 3.7 3.7  ?CL 104 105 106 105  --   ?CO2 '24 25 27 27  '$ --   ?GLUCOSE 143* 133* 135* 159*  --   ?BUN 53* 57* 65* 70*  --   ?CREATININE 2.23* 2.52* 2.32* 2.00*  --   ?CALCIUM 8.4* 7.9* 7.8* 7.9*  --   ?MG  --   --  2.2 2.1 2.4  ?PHOS  --   --  4.2 4.5 3.8  ? ?GFR: ?Estimated Creatinine Clearance: 21.8 mL/min (A) (by C-G formula based on SCr of 2  mg/dL (H)). ?Liver Function Tests: ?Recent Labs  ?Lab 10/18/2021 ?2134 09/30/21 ?9643 10/01/21 ?1129 10/02/21 ?0630  ?AST 57* 50* 72*  --   ?ALT '22 20 20  '$ --   ?ALKPHOS 83 65 63  --   ?BILITOT 0.8 0.8 0.7  --   ?PROT 7.0 5.8* 5.7*  --   ?

## 2021-10-03 NOTE — Plan of Care (Signed)
?  Problem: Coping: ?Goal: Psychosocial and spiritual needs will be supported ?Outcome: Progressing ?  ?Problem: Respiratory: ?Goal: Will maintain a patent airway ?Outcome: Progressing ?Goal: Complications related to the disease process, condition or treatment will be avoided or minimized ?Outcome: Progressing ?  ?Problem: Clinical Measurements: ?Goal: Will remain free from infection ?Outcome: Progressing ?Goal: Diagnostic test results will improve ?Outcome: Progressing ?Goal: Cardiovascular complication will be avoided ?Outcome: Progressing ?  ?Problem: Activity: ?Goal: Risk for activity intolerance will decrease ?Outcome: Progressing ?  ?Problem: Nutrition: ?Goal: Adequate nutrition will be maintained ?Outcome: Progressing ?  ?Problem: Elimination: ?Goal: Will not experience complications related to bowel motility ?Outcome: Progressing ?Goal: Will not experience complications related to urinary retention ?Outcome: Progressing ?  ?Problem: Pain Managment: ?Goal: General experience of comfort will improve ?Outcome: Progressing ?  ?Problem: Skin Integrity: ?Goal: Risk for impaired skin integrity will decrease ?Outcome: Progressing ?  ?Problem: Safety: ?Goal: Non-violent Restraint(s) ?Outcome: Progressing ?  ?Problem: Education: ?Goal: Knowledge of risk factors and measures for prevention of condition will improve ?Outcome: Not Progressing ?  ?Problem: Education: ?Goal: Knowledge of General Education information will improve ?Description: Including pain rating scale, medication(s)/side effects and non-pharmacologic comfort measures ?Outcome: Not Progressing ?  ?Problem: Health Behavior/Discharge Planning: ?Goal: Ability to manage health-related needs will improve ?Outcome: Not Progressing ?  ?Problem: Clinical Measurements: ?Goal: Ability to maintain clinical measurements within normal limits will improve ?Outcome: Not Progressing ?Goal: Respiratory complications will improve ?Outcome: Not Progressing ?  ?Problem:  Coping: ?Goal: Level of anxiety will decrease ?Outcome: Not Progressing ?  ?Problem: Safety: ?Goal: Ability to remain free from injury will improve ?Outcome: Not Progressing ?  ?

## 2021-10-03 NOTE — Progress Notes (Signed)
OT Cancellation Note ? ?Patient Details ?Name: [name] [name] ?MRN: [id number] ?DOB: [birth year] ? ? ?Cancelled Treatment:    Reason Eval/Treat Not Completed: Patient not medically ready patient is in restrants at this time with nursing asking to hold off on therapy until 10/04/21. OT to continue to follow and check back as schedule will allow.  ?Deago Burruss OTR/L, MS ?Acute Rehabilitation Department ?Office# [phone] ?Pager# [phone] ? ? ? ?10/03/2021, 1:21 PM ?

## 2021-10-03 NOTE — Progress Notes (Signed)
CIWA score currently 5, will not give pt ativan at this time as he has had 4 mg since ~1725.  PT is still restrained, minimally agitated , pulling up on restraint when RN put BP cuff on, but when told to relax his arm he did so.  Pt with new abdominal breathing (new from this morning when RN handed patient off to day RN)  will continue to monitor CIWA and restraint needs.   ?Pt has not responded verbally to RN this shift.  Previous night when restlessness began he was at least saying yes or no even if it was an inappropriate response to RN/NT request or statements ? ?Recheck CIWA ~ 2124 score back to 15, '2mg'$  given IV ~2131 ?Pt calmed down, was not fighting against restraints, HR back down to more normal range and no longer vocalizing (moaning and groaning) while RN was in the room ?~2233 RN in room to stop IV antibiotics and to recheck CIWA, pt score a 15, fighting against restraints, restless with feet and legs, moaning and groaning but not responsive to RNs verbal and tactile stimuli.  RN will give ativan when pyxis is restocked ?~2304 '2mg'$  ativan given ?~0004 pt resting HR down, pt appears to be peaceful CIWA 4 ?~0130 watching pt on telemetry, HR was going up highest 108, checked on PT , did set of vitals, O2 low despite HFNC '@10L'$ (mouth breathing)  RN put NRB @ 15L, put pulse ox set up on ear and fixed telemetry leads.  Turned HFNC to down to 2-3L as the [place] is not currently an effective O2 delivery method.  Pt still restless and agitated CIWA back up to 15 gave '2mg'$  ativan.  When RN left pt looked to be resting comfortably not moaning or restless in the bed   ?~0254 checked in on pt, CIWA 5, will not give ativan at this time.  Pt appears to be much more comfortable since changing the O2 delivery method ?~0526 RN to check CIWA, give bath, change primofit.  Pt back up to CIWA score 15 2 mg ativan given.  After administration of medication, RN let right hand loose to roll pt over for linen change and bath, pt  immediately reached up to attempt to take the NRB mask off, pt did not fight RN when she took his hand down away from the mask.  RN and NT left pt supine in bed, restraints put back after bath, pt appears to be resting, still with abdominal accessory muscle breathing. ? ?

## 2021-10-03 NOTE — Progress Notes (Addendum)
Per the patient's son via phone.  The patient drinks one shot of Vodka and Sprite at night.  His son states, he does not think he drinks every night.   ? ?CIWA initiated due to concern for alcohol withdrawal,  likely contributing to his agitated delirium. ?

## 2021-10-04 ENCOUNTER — Encounter (HOSPITAL_COMMUNITY): Payer: Self-pay | Admitting: Family Medicine

## 2021-10-04 ENCOUNTER — Inpatient Hospital Stay (HOSPITAL_COMMUNITY): Payer: Medicare Other

## 2021-10-04 DIAGNOSIS — N179 Acute kidney failure, unspecified: Secondary | ICD-10-CM | POA: Diagnosis not present

## 2021-10-04 DIAGNOSIS — Z515 Encounter for palliative care: Secondary | ICD-10-CM | POA: Diagnosis not present

## 2021-10-04 DIAGNOSIS — R6520 Severe sepsis without septic shock: Secondary | ICD-10-CM | POA: Diagnosis not present

## 2021-10-04 DIAGNOSIS — G9340 Encephalopathy, unspecified: Secondary | ICD-10-CM | POA: Diagnosis not present

## 2021-10-04 DIAGNOSIS — Z7189 Other specified counseling: Secondary | ICD-10-CM | POA: Diagnosis not present

## 2021-10-04 DIAGNOSIS — N189 Chronic kidney disease, unspecified: Secondary | ICD-10-CM | POA: Diagnosis not present

## 2021-10-04 DIAGNOSIS — A419 Sepsis, unspecified organism: Secondary | ICD-10-CM | POA: Diagnosis not present

## 2021-10-04 LAB — CBC WITH DIFFERENTIAL/PLATELET
Abs Immature Granulocytes: 0.24 10*3/uL — ABNORMAL HIGH (ref 0.00–0.07)
Basophils Absolute: 0.1 10*3/uL (ref 0.0–0.1)
Basophils Relative: 0 %
Eosinophils Absolute: 0 10*3/uL (ref 0.0–0.5)
Eosinophils Relative: 0 %
HCT: 46.9 % (ref 39.0–52.0)
Hemoglobin: 14.9 g/dL (ref 13.0–17.0)
Immature Granulocytes: 1 %
Lymphocytes Relative: 2 %
Lymphs Abs: 0.3 10*3/uL — ABNORMAL LOW (ref 0.7–4.0)
MCH: 32.5 pg (ref 26.0–34.0)
MCHC: 31.8 g/dL (ref 30.0–36.0)
MCV: 102.4 fL — ABNORMAL HIGH (ref 80.0–100.0)
Monocytes Absolute: 1 10*3/uL (ref 0.1–1.0)
Monocytes Relative: 6 %
Neutro Abs: 17.2 10*3/uL — ABNORMAL HIGH (ref 1.7–7.7)
Neutrophils Relative %: 91 %
Platelets: 157 10*3/uL (ref 150–400)
RBC: 4.58 MIL/uL (ref 4.22–5.81)
RDW: 14.3 % (ref 11.5–15.5)
WBC: 18.8 10*3/uL — ABNORMAL HIGH (ref 4.0–10.5)
nRBC: 0 % (ref 0.0–0.2)

## 2021-10-04 LAB — COMPREHENSIVE METABOLIC PANEL
ALT: 44 U/L (ref 0–44)
AST: 143 U/L — ABNORMAL HIGH (ref 15–41)
Albumin: 2.8 g/dL — ABNORMAL LOW (ref 3.5–5.0)
Alkaline Phosphatase: 65 U/L (ref 38–126)
Anion gap: 14 (ref 5–15)
BUN: 105 mg/dL — ABNORMAL HIGH (ref 8–23)
CO2: 25 mmol/L (ref 22–32)
Calcium: 8.1 mg/dL — ABNORMAL LOW (ref 8.9–10.3)
Chloride: 109 mmol/L (ref 98–111)
Creatinine, Ser: 2.44 mg/dL — ABNORMAL HIGH (ref 0.61–1.24)
GFR, Estimated: 24 mL/min — ABNORMAL LOW (ref >60)
Glucose, Bld: 162 mg/dL — ABNORMAL HIGH (ref 70–99)
Potassium: 3.8 mmol/L (ref 3.5–5.1)
Sodium: 148 mmol/L — ABNORMAL HIGH (ref 135–145)
Total Bilirubin: 1.3 mg/dL — ABNORMAL HIGH (ref 0.3–1.2)
Total Protein: 5.8 g/dL — ABNORMAL LOW (ref 6.5–8.1)

## 2021-10-04 LAB — D-DIMER, QUANTITATIVE: D-Dimer, Quant: 2.99 ug/mL-FEU — ABNORMAL HIGH (ref 0.00–0.50)

## 2021-10-04 LAB — PROCALCITONIN: Procalcitonin: 0.49 ng/mL

## 2021-10-04 LAB — PHOSPHORUS: Phosphorus: 6.4 mg/dL — ABNORMAL HIGH (ref 2.5–4.6)

## 2021-10-04 LAB — MAGNESIUM: Magnesium: 2.6 mg/dL — ABNORMAL HIGH (ref 1.7–2.4)

## 2021-10-04 MED ORDER — LORAZEPAM 2 MG/ML IJ SOLN
1.0000 mg | INTRAMUSCULAR | Status: DC | PRN
  Administered 2021-10-04: 1 mg via INTRAVENOUS
  Filled 2021-10-04: qty 1

## 2021-10-04 MED ORDER — HYDROMORPHONE HCL 1 MG/ML IJ SOLN: 1.0000 mg | INTRAMUSCULAR | Status: DC | PRN

## 2021-10-04 MED ORDER — SODIUM CHLORIDE 0.9 % IV SOLN
1.0000 mg/h | INTRAVENOUS | Status: DC
  Administered 2021-10-04: 1 mg/h via INTRAVENOUS
  Filled 2021-10-04: qty 5

## 2021-10-04 MED ORDER — HYDROMORPHONE BOLUS VIA INFUSION
1.0000 mg | INTRAVENOUS | Status: DC | PRN
  Filled 2021-10-04: qty 1

## 2021-10-04 NOTE — Consult Note (Signed)
? ?                                                                                ?Consultation Note ?Date: 10/04/2021  ? ?Patient Name: [name] [name]  ?DOB: 01/29/1923  MRN: [id number]  Age / Sex: 88 y.o., male  ?PCP: Patient, No Pcp Per (Inactive) ?Referring Physician: [name], MD ? ?Reason for Consultation: Establishing goals of care ? ?HPI/Patient Profile: 88 y.o. male admitted on 10/11/2021   ?[name] [name] is a 88 y.o. male with a hx of CAD with remote inferior MI (hx of stent to Cx, known CTO of RCA), aortic stenosis (s/p bioprosthetic AVR 2003 with AI s/p valve in valve TAVR 03/2014, subsequent progressive AS treated medically), chronic HFpEF, AAA s/p repair 2004, PAD, CVA, HTN, HLD, BPH, DNR, CKD 3b, glaucoma  ?Clinical Assessment and Goals of Care: ? Patient admitted with coronavirus, was reportedly ambulatory and functioning well independently up until very recently.  ?Patient now has high O2 requirements, not awake not alert, worsening overall condition, increasing symptom burden.  ?A palliative consult has been requested for continuing goals of care discussions.  ?Chart reviewed, discussed with TRH MD and bedside RN.  ?Patient seen and examined.  ?Discussed with daughter [name] [name] who is at the bedside.  ?We talked about the patient's current condition, we discussed frankly but compassionately about the possibility that the patient will continue to decline and decompensate, in spite of current interventions. As such, prognosis appears to be markedly limited. We talked about goals and wishes.  ?Patient's daughter states that his wife is deceased, they were married for more than 60 years, patient always used to state that he would like to go be with his wife in heaven. He was independent and both son and daughter had hoped that he would live to a 89. Offered her my support and we talked about addressing his symptoms and that he is fast approaching end of life in my opinion. She is tearful and  aware of his condition and his prognosis.  ?See below.  ? ?NEXT OF KIN ? Daughter [name] [name] at bedside ? ?SUMMARY OF RECOMMENDATIONS   ? DNR DNI ?Comfort measures ?Dilaudid drip with provision of boluses ?Anticipated hospital death ?Discontinue PO medications, patient is not awake alert, not safe to take in PO medications.  ?Thank you for the consult.  ? ?Code Status/Advance Care Planning: ?DNR ? ? ?Symptom Management:  ?  ? ?Palliative Prophylaxis:  ?Delirium Protocol ? ?Additional Recommendations (Limitations, Scope, Preferences): ?Full Comfort Care ? ?Psycho-social/Spiritual:  ?Desire for further Chaplaincy support:no ?Additional Recommendations: Caregiving  Support/Resources ? ?Prognosis:  ?Hours - Days ? ?Discharge Planning: Anticipated Hospital Death  ? ?  ? ?Primary Diagnoses: ?Present on Admission: ? Sepsis ([place]) ? Essential hypertension ? Coronary artery disease s/p Inf MI, stent to LCx, known CTO of RCA ? ? ?I have reviewed the medical record, interviewed the patient and family, and examined the patient. The following aspects are pertinent. ? ?Past Medical History:  ?Diagnosis Date  ? AAA (abdominal aortic aneurysm)   ? s/p endovascular repair 2004  ? Aortic insufficiency   ? Prosthetic valve dysfunction  ? Aortic stenosis   ?  s/p AVR in 2003, bovine  ? Arthritis   ? BCC (basal cell carcinoma) 09/17/2012  ? BPH (benign prostatic hyperplasia)   ? CAD (coronary artery disease)   ? stents in [facility] and has a totally occluded RCA  ? Chicken pox   ? CVA (cerebral infarction) 06/2008  ? Depression   ? Diverticulosis   ? Glaucoma   ? recently told by Dr Janyth Contes he did not have it  ? HTN (hypertension)   ? Hyperlipemia   ? Measles   ? Mumps   ? Personal history of poliomyelitis 09/17/2012  ? In childhood  ? Polio   ? Prosthetic valve dysfunction   ? Aortic stenosis and insufficiency   ? Retinal hemorrhage of right eye 11/02/2019  ? Right iliac artery stenosis (HCC)   ? S/P TAVR (transcatheter aortic valve replacement)  03/23/2014  ? 23 mm Edwards Sapien transcatheter heart valve placed via open left transfemoral approach  ? Stroke [facility])   ? no residual  ? Thyroid disease   ? ?Social History  ? ?Socioeconomic History  ? Marital status: Widowed  ?  Spouse name: Not on file  ? Number of children: Not on file  ? Years of education: Not on file  ? Highest education level: Not on file  ?Occupational History  ? Occupation: retired  ?  Employer: RETIRED  ?Tobacco Use  ? Smoking status: Former  ?  Packs/day: 0.50  ?  Years: 30.00  ?  Pack years: 15.00  ?  Types: Cigarettes  ?  Quit date: 10/04/1980  ?  Years since quitting: 41.0  ? Smokeless tobacco: Never  ?Vaping Use  ? Vaping Use: Never used  ?Substance and Sexual Activity  ? Alcohol use: Yes  ?  Alcohol/week: 7.0 standard drinks  ?  Types: 7 Shots of liquor per week  ?  Comment: cocktails/night  ? Drug use: No  ? Sexual activity: Not Currently  ?Other Topics Concern  ? Not on file  ?Social History Narrative  ? Not on file  ? ?Social Determinants of Health  ? ?Financial Resource Strain: Not on file  ?Food Insecurity: Not on file  ?Transportation Needs: Not on file  ?Physical Activity: Not on file  ?Stress: Not on file  ?Social Connections: Not on file  ? ?Family History  ?Problem Relation Age of Onset  ? Colon cancer Mother   ? Cancer Mother   ? Aneurysm Father   ? Heart attack Brother   ?     CHF  ? Prostate cancer Brother   ? Cancer Brother   ? Heart disease Brother   ? Cancer Daughter   ? ?Scheduled Meds: ? vitamin C  500 mg Oral Daily  ? aspirin  81 mg Oral Daily  ? cholecalciferol  1,000 Units Oral Daily  ? finasteride  5 mg Oral Daily  ? heparin  5,000 Units Subcutaneous Q8H  ? Ipratropium-Albuterol  1 puff Inhalation Q6H  ? levothyroxine  50 mcg Oral Q0600  ? melatonin  3 mg Oral QHS  ? metoprolol tartrate  25 mg Oral BID  ? multivitamin  1 tablet Oral BID  ? PARoxetine  10 mg Oral Daily  ? predniSONE  40 mg Oral Q breakfast  ? zinc sulfate  220 mg Oral Daily  ? ?Continuous  Infusions: ? sodium chloride 10 mL/hr at 10/04/21 0545  ? ceFEPime (MAXIPIME) IV Stopped (10/03/21 2231)  ? HYDROmorphone    ? ?PRN Meds:.sodium chloride, acetaminophen **OR** acetaminophen, HYDROmorphone **  AND** HYDROmorphone (DILAUDID) injection, ipratropium-albuterol, LORazepam, metoprolol tartrate ?Medications Prior to Admission:  ?Prior to Admission medications   ?Medication Sig Start Date End Date Taking? Authorizing Provider  ?acetaminophen (TYLENOL) 500 MG tablet Take 1,000 mg by mouth every 4 (four) hours as needed for headache (pain).   Yes [provider]  ?aspirin 81 MG tablet Take 81 mg by mouth daily.   Yes [provider]  ?B Complex-C (SUPER B COMPLEX) TABS Take 1 tablet by mouth daily.   Yes [provider]  ?clindamycin (CLEOCIN) 300 MG capsule Take 600 mg by mouth See admin instructions. Take 2 capsules (600 mg) by mouth one hour prior to dental appointments 09/16/21  Yes [provider]  ?finasteride (PROSCAR) 5 MG tablet Take 1 tablet (5 mg total) by mouth daily. 05/18/21  Yes Weaver, Scott T, PA-C  ?ipratropium (ATROVENT) 0.03 % nasal spray Place 2 sprays into both nostrils daily as needed for rhinitis. 05/04/19  Yes [provider]  ?levothyroxine (SYNTHROID) 50 MCG tablet Take 1 tablet (50 mcg total) by mouth daily. 05/18/21  Yes Richardson Dopp T, PA-C  ?metoprolol tartrate (LOPRESSOR) 25 MG tablet Take 1 tablet (25 mg total) by mouth 2 (two) times daily. 05/18/21  Yes Richardson Dopp T, PA-C  ?Multiple Vitamins-Minerals (PRESERVISION AREDS 2 PO) Take 1 capsule by mouth 2 (two) times daily.    Yes [provider]  ?PARoxetine (PAXIL) 20 MG tablet Take 0.5 tablets (10 mg total) by mouth every morning. 05/22/21 05/22/22 Yes Weaver, Scott T, PA-C  ?potassium chloride (MICRO-K) 10 MEQ CR capsule Take 1 capsule (10 mEq total) by mouth daily. 05/18/21  Yes Weaver, Scott T, PA-C  ?torsemide (DEMADEX) 20 MG tablet Take 1 tablet (20 mg total) by mouth  2 (two) times daily. ?Patient taking differently: Take 30 mg by mouth every morning. 08/30/21 08/30/22 Yes Richardson Dopp T, PA-C  ? ?Allergies  ?Allergen Reactions  ? Lasix [Furosemide] Swelling  ?  Tongue swelling

## 2021-10-04 NOTE — Consult Note (Deleted)
?Cardiology Consultation:  ? ?Patient ID: [name] [name] ?MRN: [id number]; DOB: [birth year] ? ?Admit date: 10/01/2021 ?Date of Consult: 10/04/2021 ? ?PCP:  Patient, No Pcp Per (Inactive) ?  ?[facility] Providers ?Cardiologist:  Sherren Mocha, MD  ?Cardiology APP:  Liliane Shi, PA-C     ? ? ?Patient Profile:  ? ?[name] [name] is a 88 y.o. male with a hx of CAD with remote inferior MI (hx of stent to Cx, known CTO of RCA), aortic stenosis (s/p bioprosthetic AVR 2003 with AI s/p valve in valve TAVR 03/2014, subsequent progressive AS treated medically), chronic HFpEF, AAA s/p repair 2004, PAD, CVA, HTN, HLD, BPH, DNR, CKD 3b, glaucoma who is being seen 10/04/2021 for the evaluation of elevated troponin at the request of Dr. Nevada Crane. ? ?History of Present Illness:  ? ?Mr. Trigueros follows with Dr. Juliann Pulse with the above history, last OV 08/2021, felt to be overall stable except he noted he had been getting weaker overall. He was counseled on decreasing high sodium in diet and increasing activity. Per recent office phone notes, Nicki Reaper had offered consideration of referring to hospice if they would like to pursue. The patient presented to the hospital 10/09/2021 with weakness, confusion, and fever for several days. He also had had some abdominal discomfort and worsening of chronic cough. He was found to have sepsis and acute hypoxic respiratory failure due to multifocal PNA in the setting of Covid-19 infection and possible superimposed bacterial co-infection, as well as positive urine culture for Enterococcus, AKI superimposed on CKD 3b, hypoalbuminemia, lactic acidosis, elevated troponin and prolonged QT interval.  On admission he was continued on torsemide '20mg'$  BID then transitioned to IV Lasix ('20mg'$  3/13, '20mg'$  x 3 doses on 3/14). Overnight patient became increasingly confused and agitated, requiring restraints, not compliant with wearing O2, restless. This was initially felt due to possible ETOH w/d although  upon clarification he drinks 1 mixed drink a day and this is felt less likely the case. BUN today is 105 as well. Weight is down about 8lb from prior office weight 08/2021. He remains lethargic with intermittent periods of thrashing at this time, unable to provide meaningful history. He is tachycardic - telemetry shows sinus tach versus flutter/a-tach with intermittent bundle branch block. ? ?Notable labs: troponin 340 ->327, BNP peak 394, lactic acid 2.5, d-dimer 9.93 on admission downtrending to 2.99, today noted to be hypernatremic at 148, with continued AKI BUN 105, Cr 2.44, Mg 2.6, albumin 2.8, AST 143, ALT 44, leukocytosis of 18.8 ? ?Rads:  ?- CT chest was notable for patchy infiltrates/GGO, chronic bronchitic changes with mucous plugging versus secretions, aortic atherosclerosis, and cholelithiasis.  ?- 2D echo 10/02/21 EF 50-55%, moderate concentric LVH, grade 1 DD with elevated LVEDP and akinesis of the left ventricular, basal inferior wall, TAVR present - Compared to study dated 11/07/2016, the mean AVG has decreased from  ?41mHg to 33.511mg and DVI has decreased from 0.19 to 0.14 and peak AV velocity has decreased from 413 cm/s to 358 cm/s ? ?Past Medical History:  ?Diagnosis Date  ? AAA (abdominal aortic aneurysm)   ? s/p endovascular repair 2004  ? Aortic insufficiency   ? Prosthetic valve dysfunction  ? Aortic stenosis   ? s/p AVR in 2003, bovine  ? Arthritis   ? BCC (basal cell carcinoma) 09/17/2012  ? BPH (benign prostatic hyperplasia)   ? CAD (coronary artery disease)   [facility]  ? Chicken pox   ?  CVA (cerebral infarction) 12/09  ? Depression   ? Diverticulosis   ? Glaucoma   ? recently told by Dr Janyth Contes he did not have it  ? HTN (hypertension)   ? Hyperlipemia   ? Leaky heart valve   ? Measles   ? Mumps   ? Myocardial infarction [facility])   ? Personal history of poliomyelitis 09/17/2012  ? In childhood  ? Pneumonia   ? hx of  ? Polio   ? Prosthetic valve dysfunction    ? Aortic stenosis and insufficiency   ? Retinal hemorrhage of right eye 11/02/2019  ? Right iliac artery stenosis (HCC)   ? 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  ? 23 mm Edwards Sapien transcatheter heart valve placed via open left transfemoral approach  ? Shortness of breath   ? exertion, orthopnea  ? Sleeping difficulties   ? Stroke [facility])   ? no residual  ? Thyroid disease   ? ? ?Past Surgical History:  ?Procedure Laterality Date  ? ABDOMINAL AORTIC ANEURYSM REPAIR  2004  ? s/p endovascular repiar of AAA in 2004  ? AORTIC VALVE REPLACEMENT  2003  ? #26m pericardial valve   ? APPENDECTOMY    ? CARDIAC CATHETERIZATION    ? CORONARY ANGIOPLASTY    ? CORONARY ARTERY BYPASS GRAFT    ? CORONARY STENT PLACEMENT    ? INTRAOPERATIVE TRANSESOPHAGEAL ECHOCARDIOGRAM N/A 03/23/2014  ? Procedure: INTRAOPERATIVE TRANSESOPHAGEAL ECHOCARDIOGRAM;  Surgeon: MSherren Mocha MD;  [place] [facility]  Service: Open Heart Surgery;  Laterality: N/A;  ? JOINT REPLACEMENT    ? LEFT AND RIGHT HEART CATHETERIZATION WITH CORONARY ANGIOGRAM N/A 02/08/2014  ? Procedure: LEFT AND RIGHT HEART CATHETERIZATION WITH CORONARY ANGIOGRAM;  Surgeon: MBlane Ohara MD;  [facility];  Service: Cardiovascular;  Laterality: N/A;  ? TEE WITHOUT CARDIOVERSION N/A 09/02/2013  ? Procedure: TRANSESOPHAGEAL ECHOCARDIOGRAM (TEE);  Surgeon: KDorothy Spark MD;  [place]  Service: Cardiovascular;  Laterality: N/A;  ? TONSILLECTOMY    ? TRANSCATHETER AORTIC VALVE REPLACEMENT, TRANSFEMORAL N/A 03/23/2014  ? Procedure: TRANSCATHETER AORTIC VALVE REPLACEMENT, TRANSFEMORAL;  Surgeon: MSherren Mocha MD;  [place]  Service: Open Heart Surgery;  Laterality: N/A;  valve in valve TAVR  ? WISDOM TOOTH EXTRACTION    ?  ? ?Home Medications:  ?Prior to Admission medications   ?Medication Sig Start Date End Date Taking? Authorizing Provider  ?acetaminophen (TYLENOL) 500 MG tablet Take 1,000 mg by mouth every 4 (four) hours as  needed for headache (pain).   Yes [provider]  ?aspirin 81 MG tablet Take 81 mg by mouth daily.   Yes [provider]  ?B Complex-C (SUPER B COMPLEX) TABS Take 1 tablet by mouth daily.   Yes [provider]  ?clindamycin (CLEOCIN) 300 MG capsule Take 600 mg by mouth See admin instructions. Take 2 capsules (600 mg) by mouth one hour prior to dental appointments 09/16/21  Yes [provider]  ?finasteride (PROSCAR) 5 MG tablet Take 1 tablet (5 mg total) by mouth daily. 05/18/21  Yes Weaver, Scott T, PA-C  ?ipratropium (ATROVENT) 0.03 % nasal spray Place 2 sprays into both nostrils daily as needed for rhinitis. 05/04/19  Yes [provider]  ?levothyroxine (SYNTHROID) 50 MCG tablet Take 1 tablet (50 mcg total) by mouth daily. 05/18/21  Yes WRichardson DoppT, PA-C  ?metoprolol tartrate (LOPRESSOR) 25 MG tablet Take 1 tablet (25 mg total) by mouth 2 (two) times daily. 05/18/21  Yes WKathlen Mody  Blair Dolphin, PA-C  ?Multiple Vitamins-Minerals (PRESERVISION AREDS 2 PO) Take 1 capsule by mouth 2 (two) times daily.    Yes [provider]  ?PARoxetine (PAXIL) 20 MG tablet Take 0.5 tablets (10 mg total) by mouth every morning. 05/22/21 05/22/22 Yes Weaver, Scott T, PA-C  ?potassium chloride (MICRO-K) 10 MEQ CR capsule Take 1 capsule (10 mEq total) by mouth daily. 05/18/21  Yes Weaver, Scott T, PA-C  ?torsemide (DEMADEX) 20 MG tablet Take 1 tablet (20 mg total) by mouth 2 (two) times daily. ?Patient taking differently: Take 30 mg by mouth every morning. 08/30/21 08/30/22 Yes Richardson Dopp T, PA-C  ? ? ?Inpatient Medications: ?Scheduled Meds: ? vitamin C  500 mg Oral Daily  ? aspirin  81 mg Oral Daily  ? benzonatate  200 mg Oral TID  ? cholecalciferol  1,000 Units Oral Daily  ? finasteride  5 mg Oral Daily  ? folic acid  1 mg Oral Daily  ? heparin  5,000 Units Subcutaneous Q8H  ? Ipratropium-Albuterol  1 puff Inhalation Q6H  ? levothyroxine  50 mcg Oral Q0600  ? melatonin  3 mg Oral QHS   ? metoprolol tartrate  25 mg Oral BID  ? multivitamin  1 tablet Oral BID  ? multivitamin with minerals  1 tablet Oral Daily  ? PARoxetine  10 mg Oral Daily  ? potassium chloride  20 mEq Oral Daily  ? pre

## 2021-10-04 NOTE — Progress Notes (Signed)
PT Cancellation Note ? ?Patient Details ?Name: LAZLO TUNNEY ?MRN: [id number] ?DOB: [birth year] ? ? ?Cancelled Treatment:    Reason Eval/Treat Not Completed: Medical issues which prohibited therapy. On NRB and in restraints. Will hold PT for now and check back another time as schedule allows ? ? ? ?Doreatha Massed, PT ?Acute Rehabilitation  ?Office: [phone] ?Pager: [phone] ? ?  ?

## 2021-10-04 NOTE — Progress Notes (Signed)
Brief note  ?Long discussion with [name] [phone] patient's daughter in person on 4e Unit ? ?It appears patient until about a year ago was ambulatory and high functioning independently-he has had a gentle but gradual decline physically-mentally he is still very sharp  ? ?His daughter and his son check in on him periodically and noticed over the past week or so that he was starting to sound hoarse and was not "doing well" ? ?It appears he has been treated so far for coronavirus with steroids as the patient's son declined the use of any monoclonal antibody-patient unfortunately has declined- ? ?it was clarified that patient only drinks 1 mixed drink a day and actually measures the amount of alcohol-he does not drink more than this and is not an alcoholic ? ?As such I do not think that he has delirium tremens-I think that the patient has worsening hypoxia from multifocal pneumonia which has already been treated with 5 days of cefepime and is just failing to recover. ? ?I discussed palliative/hospice philosophy with the patient'sm daughter in great detail--I explained that he is probably at the end of his probable expected life expectancy.  We confirmed that he is DO NOT RESUSCITATE-we did go through a little bit of the decision making around this. ? ? ?We will ask our chaplain coming and talk to Mrs. Tamala Julian as she is dealing with her own health issues-her brother also seems to have contracted COVID additionally ? ?P ?I will ask palliative care to please come by later in the day and delineate goals a little bit more although I do think patient is terminally ill.  We will discontinue the CIWA protocol, place the patient on as needed Ativan and keep physical restraints in place for now. ? ?> 35 minutes palliative care time ?

## 2021-10-04 NOTE — Progress Notes (Signed)
?PROGRESS NOTE ? ? ?[name] [name]  OAC:[id number] DOB: 07/24/1922 DOA: 10/20/2021 ?PCP: Patient, No Pcp Per (Inactive)  ?Brief Narrative:  ?66 white male home dwelling ?CVA 2009, aortic stenosis status post bovine AVR 2003, endovascular AAA repair 2004, TAVR 03/23/2014 ?CAD status post angioplasty left circumflex stent 1997 ? ?Started feeling sleepy at home over the past week prior to admission-less appetite-more shaky than usual and less active-he had recently been taking his usual dose of torsemide ?Came to [facility] long ED found to have O2 sats 89% placed on 4 L, Tmax 102 ?Patient was found to be tachypneic confused when evaluated UA was negative ?CT chest showed multifocal pneumonia in the setting of COVID-19 ? ?3/13-progressive increase in oxygen requirement ?3/15-hypoxic to the 80s placed on high flow nasal cannula- ? ?Antibiotics  ?received 5 days of cefepime ending 3/14 ?fosfomycin started 3/14 ? ?Labs reviewed- ?sodium 143-148 ?BUNs/creatinine 70/2.0-->105/2.44 ?Phosphorus 6.4 magnesium 2.6 ?AST/ALT 72/20-->143/44 ?Bilirubin 0.7-->1.3 ?Procalcitonin 0.3-->0.49 ? ?UOP less than 150 cc ? ?Cxr from 3/15 ?IMPRESSION: ?Persistent interstitial opacity within the right upper lobe ?compatible with pneumonia. ? ?Hospital-Problem based course ? ?Sepsis secondary to multifocal likely COVID-related superinfected bacterial pneumonia ?Persistent and worsening hypoxic respiratory failure secondary to the above ?Toxic metabolic encephalopathy with delirium ?Elevated troponin with demand ischemia ?Enterococcus faecalis UTI ?Elevated D-dimer with downward trend in the setting of COVID-19 ?AKI ?HFpEF ? ? ? ?See my other note from today-I had a long discussion with the patient's daughter this morning ?Greatly appreciate palliative care coming by and consolidating goals of care for the patient-it appears to be moving in the direction of comfort-I do not anticipate the patient will live beyond the next 48 hours based on  physiological parameters as well as apparent ?We will ask that chaplain be available for family member ? ? ?DVT prophylaxis: Discontinue all DVT prophylaxis ?Code Status: DNR comfort trajectory ?Family Communication: Discussed in detail with [name] at phone number [phone] at the bedside ?Disposition:  ?Status is: Inpatient ?Remains inpatient appropriate because:  ?  ?Consultants:  ?Palliative care ?Cardiology ? ?Procedures: No ? ?Antimicrobials: Multiple as above ? ? ?Subjective: ? ?Cannot get ROS ? ? ?Objective: ?Vitals:  ? 10/04/21 0254 10/04/21 0528 10/04/21 0606 10/04/21 1247  ?BP:  123/61  126/66  ?Pulse: 94 90    ?Resp:  18  (!) 27  ?Temp:  97.8 ?F (36.6 ?C)  98.9 ?F (37.2 ?C)  ?TempSrc:  Oral  Axillary  ?SpO2:  100%  97%  ?Weight:   82.2 kg   ?Height:      ? ? ?Intake/Output Summary (Last 24 hours) at 10/04/2021 1525 ?Last data filed at 10/04/2021 0545 ?Gross per 24 hour  ?Intake 288.82 ml  ?Output 250 ml  ?Net 38.82 ml  ? ?Filed Weights  ? 10/01/21 0319 10/02/21 0535 10/04/21 0606  ?Weight: 84.1 kg 82.9 kg 82.2 kg  ? ? ?Examination: ? ?Patient breathing about 35 times a minute when I saw him-barely responsive-patient is restrained in upper restraints ?Abdomen seems slightly distended ?He is unable to coherently explain anything to me but does seem to awaken to pain-GCS assigned was 6 ? ? ?Data Reviewed: personally reviewed  ? ?CBC ?   ?Component Value Date/Time  ? WBC 18.8 (H) 10/04/2021 0359  ? RBC 4.58 10/04/2021 0359  ? HGB 14.9 10/04/2021 0359  ? HGB 15.7 03/01/2020 1532  ? HCT 46.9 10/04/2021 0359  ? HCT 47.0 03/01/2020 1532  ? PLT 157 10/04/2021 0359  ? PLT 165 03/01/2020  1532  ? MCV 102.4 (H) 10/04/2021 0359  ? MCV 98 (H) 03/01/2020 1532  ? MCH 32.5 10/04/2021 0359  ? MCHC 31.8 10/04/2021 0359  ? RDW 14.3 10/04/2021 0359  ? RDW 12.0 03/01/2020 1532  ? LYMPHSABS 0.3 (L) 10/04/2021 0359  ? MONOABS 1.0 10/04/2021 0359  ? EOSABS 0.0 10/04/2021 0359  ? BASOSABS 0.1 10/04/2021 0359  ? ?CMP Latest Ref  Rng & Units 10/04/2021 10/03/2021 10/02/2021  ?Glucose 70 - 99 mg/dL 162(H) - 159(H)  ?BUN 8 - 23 mg/dL 105(H) - 70(H)  ?Creatinine 0.61 - 1.24 mg/dL 2.44(H) - 2.00(H)  ?Sodium 135 - 145 mmol/L 148(H) - 143  ?Potassium 3.5 - 5.1 mmol/L 3.8 3.7 3.7  ?Chloride 98 - 111 mmol/L 109 - 105  ?CO2 22 - 32 mmol/L 25 - 27  ?Calcium 8.9 - 10.3 mg/dL 8.1(L) - 7.9(L)  ?Total Protein 6.5 - 8.1 g/dL 5.8(L) - -  ?Total Bilirubin 0.3 - 1.2 mg/dL 1.3(H) - -  ?Alkaline Phos 38 - 126 U/L 65 - -  ?AST 15 - 41 U/L 143(H) - -  ?ALT 0 - 44 U/L 44 - -  ? ? ? ?Radiology Studies: ?DG CHEST PORT 1 VIEW ? ?Result Date: 10/04/2021 ?CLINICAL DATA:  Follow-up pneumonia. EXAM: PORTABLE CHEST 1 VIEW COMPARISON:  10/02/2021 FINDINGS: Previous median sternotomy and aortic valve repair. Stable cardiomediastinal contours. Asymmetric interstitial opacity within the right upper lobe is unchanged from previous exam. No new findings. IMPRESSION: Persistent interstitial opacity within the right upper lobe compatible with pneumonia. Electronically Signed   By: Kerby Moors M.D.   On: 10/04/2021 08:43  ? ?DG CHEST PORT 1 VIEW ? ?Result Date: 10/02/2021 ?CLINICAL DATA:  Hypoxia, COVID positive EXAM: PORTABLE CHEST 1 VIEW COMPARISON:  10/19/2021 FINDINGS: Status post median sternotomy. Aortic valve stent endograft. Both lungs are clear. The visualized skeletal structures are unremarkable. IMPRESSION: No acute abnormality of the lungs in AP portable projection. Electronically Signed   By: Delanna Ahmadi M.D.   On: 10/02/2021 17:24   ? ? ?Scheduled Meds: ? heparin  5,000 Units Subcutaneous Q8H  ? Ipratropium-Albuterol  1 puff Inhalation Q6H  ? ?Continuous Infusions: ? sodium chloride 10 mL/hr at 10/04/21 0545  ? ceFEPime (MAXIPIME) IV Stopped (10/03/21 2231)  ? HYDROmorphone    ? ? ? LOS: 4 days  ? ?Time spent: 15 min ? ?[name], MD ?[facility] ?To contact the attending provider between 7A-7P or the covering provider during after hours 7P-7A,  please log into the web site [email or web address] and access using universal [place] password for that web site. If you do not have the password, please call the hospital operator. ? ?10/04/2021, 3:25 PM  ? ? ?

## 2021-10-04 NOTE — TOC Progression Note (Signed)
Transition of Care (TOC) - Progression Note  ? ? ?Patient Details  ?Name: [name] [name] ?MRN: [id number] ?Date of Birth: 04-05-1923 ? ?Transition of Care (TOC) CM/SW Contact  ?Tawanna Cooler, RN ?Phone Number: ?10/04/2021, 12:34 PM ? ?Clinical Narrative:    ? ?TOC consult for home health has been discontinued.  Currently consults to the chaplain and palliative care team are pending.   ?TOC will continue to follow.  ? ? ? ?

## 2021-10-04 NOTE — Progress Notes (Signed)
PT Cancellation Note ? ?Patient Details ?Name: [name] [name] ?MRN: [id number] ?DOB: 01-22-1923 ? ? ?Cancelled Treatment:    Reason Eval/Treat Not Completed:  Reviewed palliative care note-pt is now comfort care. Will sign off. ? ? ? ?Doreatha Massed, PT ?Acute Rehabilitation  ?Office: [phone] ?Pager: [phone] ? ?  ?

## 2021-10-04 NOTE — Progress Notes (Signed)
End of shift ? ?Pt was very agitated.  Pt did not open eyes but would respond to pain and pupils were brisk round and reactive.  Pt was using accessory muscles with breathing.  Pt was still in restraints as he needs to have the NRB on in order to maintain O2 sats above 90.   ? ?Dr Verlon Au spoke with daughter about pt's rapid decline and consulted palliative.  Decision was made to place pt on a dilaudid gtt.  Pt is tolerating that well.  Breathing is less labored and restlessness improved.  Restraints removed.   ? ?Do not anticipate the patient to live beyond the next 48 hours.  Family aware.  Chaplain spoke with daughter [name].   ?

## 2021-10-04 NOTE — Progress Notes (Signed)
OT Cancellation Note ? ?Patient Details ?Name: [name] [name] ?MRN: [id number] ?DOB: 05-Dec-1922 ? ? ?Cancelled Treatment:    Reason Eval/Treat Not Completed: Patient not medically ready Patient is still in restraints with NRB at this time. OT to continue to follow and check back as schedule allows.  ?Zuley Lutter OTR/L, MS ?Acute Rehabilitation Department ?Office# [phone] ?Pager# [phone] ? ? ?10/04/2021, 10:48 AM ?

## 2021-10-04 NOTE — Progress Notes (Signed)
Dr. Percival Spanish reviewed patient's decline this afternoon with Dr. Verlon Au. Given that patient is felt to be actively dying, cardiology consult not felt needed at this time, note removed. Please re-consult if we can help in any way. ?

## 2021-10-04 NOTE — Progress Notes (Signed)
Chaplain provided emotional support to [place] daughter, [name].  She has good support, but this is still very sad and hard. It is difficult that the COVID has kept some family members away; and she also does not want him to die alone.  Chaplain encouraged her to care for herself and to rest when she needs.  Chaplain also provided prayer, at Ann's request. ? ?Chaplain also provided support to son, by phone.  He reported that he is doing okay, but wishes he could be there with his dad. ? ?[employer], Bcc ?Pager, [phone] ?4:47 PM ? ?

## 2021-10-04 NOTE — Plan of Care (Signed)
?  Problem: Education: ?Goal: Knowledge of risk factors and measures for prevention of condition will improve ?Outcome: Progressing ?  ?Problem: Coping: ?Goal: Psychosocial and spiritual needs will be supported ?Outcome: Progressing ?  ?Problem: Respiratory: ?Goal: Will maintain a patent airway ?Outcome: Progressing ?Goal: Complications related to the disease process, condition or treatment will be avoided or minimized ?Outcome: Progressing ?  ?Problem: Education: ?Goal: Knowledge of General Education information will improve ?Description: Including pain rating scale, medication(s)/side effects and non-pharmacologic comfort measures ?Outcome: Progressing ?  ?Problem: Health Behavior/Discharge Planning: ?Goal: Ability to manage health-related needs will improve ?Outcome: Progressing ?  ?Problem: Clinical Measurements: ?Goal: Ability to maintain clinical measurements within normal limits will improve ?Outcome: Progressing ?Goal: Will remain free from infection ?Outcome: Progressing ?Goal: Diagnostic test results will improve ?Outcome: Progressing ?Goal: Respiratory complications will improve ?Outcome: Progressing ?Goal: Cardiovascular complication will be avoided ?Outcome: Progressing ?  ?Problem: Activity: ?Goal: Risk for activity intolerance will decrease ?Outcome: Progressing ?  ?Problem: Nutrition: ?Goal: Adequate nutrition will be maintained ?Outcome: Progressing ?  ?Problem: Coping: ?Goal: Level of anxiety will decrease ?Outcome: Progressing ?  ?Problem: Elimination: ?Goal: Will not experience complications related to bowel motility ?Outcome: Progressing ?Goal: Will not experience complications related to urinary retention ?Outcome: Progressing ?  ?Problem: Pain Managment: ?Goal: General experience of comfort will improve ?Outcome: Progressing ?  ?Problem: Safety: ?Goal: Ability to remain free from injury will improve ?Outcome: Progressing ?  ?Problem: Skin Integrity: ?Goal: Risk for impaired skin integrity will  decrease ?Outcome: Progressing ?  ?Problem: Safety: ?Goal: Non-violent Restraint(s) ?Outcome: Progressing ?  ?

## 2021-10-05 DIAGNOSIS — A419 Sepsis, unspecified organism: Secondary | ICD-10-CM | POA: Diagnosis not present

## 2021-10-05 DIAGNOSIS — R6520 Severe sepsis without septic shock: Secondary | ICD-10-CM | POA: Diagnosis not present

## 2021-10-05 DIAGNOSIS — G9340 Encephalopathy, unspecified: Secondary | ICD-10-CM | POA: Diagnosis not present

## 2021-10-05 LAB — CULTURE, BLOOD (ROUTINE X 2)
Culture: NO GROWTH
Special Requests: ADEQUATE
Special Requests: ADEQUATE

## 2021-10-21 NOTE — Progress Notes (Signed)
Pt was on Dilaudid gtt and had 72 mL residual in IV bag. This RN and CN Pam wasted 72 mL of Dilaudid in the stericycle.  ?

## 2021-10-21 NOTE — Discharge Summary (Signed)
?  DEATH SUMMARY  ? ?Patient Details  ?Name: [name] [name] ?MRN: [id number] ?DOB: 02-14-1923 ? ?Admission/Discharge Information  ? ?Admit Date:  2021/10/26  ?Date of Death: Date of Death: 11/01/21  ?Time of Death: Time of Death: 44  ?Length of Stay: 5  ?Code Status:   ?Referring Physician: Patient, No Pcp Per (Inactive)  ? ? ?Reason(s) for Hospitalization  ?Sepsis secondary to multifocal likely COVID-related superinfected bacterial pneumonia ?Persistent and worsening hypoxic respiratory failure secondary to the above ?Toxic metabolic encephalopathy with delirium ?Elevated troponin with demand ischemia ?Enterococcus faecalis UTI ?Elevated D-dimer with downward trend in the setting of COVID-19 ?AKI ?HFpEF ?  ? ?Diagnoses  ?Preliminary cause of death:  ?Secondary Diagnoses (including complications and co-morbidities):  ?Principal Problem: ?  Sepsis ([place]) ?Active Problems: ?  NSTEMI (non-ST elevated myocardial infarction) ([place]) ?  Acute kidney injury superimposed on chronic kidney disease ([place]) ?  Chronic heart failure with preserved ejection fraction (HFpEF) ([place]) ?  Essential hypertension ?  Coronary artery disease s/p Inf MI, stent to LCx, known CTO of RCA ?  S/P TAVR (transcatheter aortic valve replacement) ?  Prolonged QT interval ? ? ? ?Brief Hospital Course  ? ?52 white male home dwelling ?CVA 2009, aortic stenosis status post bovine AVR 2003, endovascular AAA repair 2004, TAVR 03/23/2014 ?CAD status post angioplasty left circumflex stent 1997 ?  ?Started feeling sleepy at home over the past week prior to admission-less appetite-more shaky than usual and less active-he had recently been taking his usual dose of torsemide ? ?Came to [facility] long ED found to have O2 sats 89% placed on 4 L, Tmax 102 ?Patient was found to be tachypneic confused when evaluated UA was negative ?CT chest showed multifocal pneumonia in the setting of COVID-19 ?  ?3/13-progressive increase in oxygen requirement ?3/15-hypoxic to the 80s  placed on high flow nasal cannula- ?  ?He received 5 days of cefepime ending 3/14, fosfomycin also given ?  ?He developed progressive liver injury and kidney injury-both of which were acute and related to sepsis from COVID ? ?We had detailed discussions about EOL car ewith his family--He was made DNAR and passed comfortably at [facility] 2021-11-01 ? ? ? ?[name] ?10/06/2021, 4:40 PM ? ? ?

## 2021-10-21 NOTE — Progress Notes (Signed)
? ? ?  OVERNIGHT PROGRESS REPORT ? ?Notified by RN that patient has expired at [facility] ? ?Patient was DNR/comfort care ? ?2 RN verified. ? ?Family was available to RN and will be at this facility. ? ? ? ? ?Gershon Cull MSNA ACNPC-AG ?Acute Care Nurse Practitioner ?[facility] ?[place] ? ?

## 2021-10-21 NOTE — Progress Notes (Signed)
Pt has expired. Time of death 25. Verified with death with CN Pam. NP Olena Heckle made aware. Pt family notified and are on their way. ?

## 2021-10-21 DEATH — deceased

## 2021-12-12 ENCOUNTER — Encounter (INDEPENDENT_AMBULATORY_CARE_PROVIDER_SITE_OTHER): Payer: Medicare Other | Admitting: Ophthalmology

## 2022-01-02 ENCOUNTER — Ambulatory Visit: Payer: Medicare Other | Admitting: Physician Assistant

## 2022-10-12 IMAGING — CR DG FOOT COMPLETE 3+V*R*
3 series · 3 of 3 positions shown · non-contrast
Comparison: None.

CLINICAL DATA: RIGHT foot pain

EXAM:
RIGHT FOOT COMPLETE - 3+ VIEW

[t foot ap right]
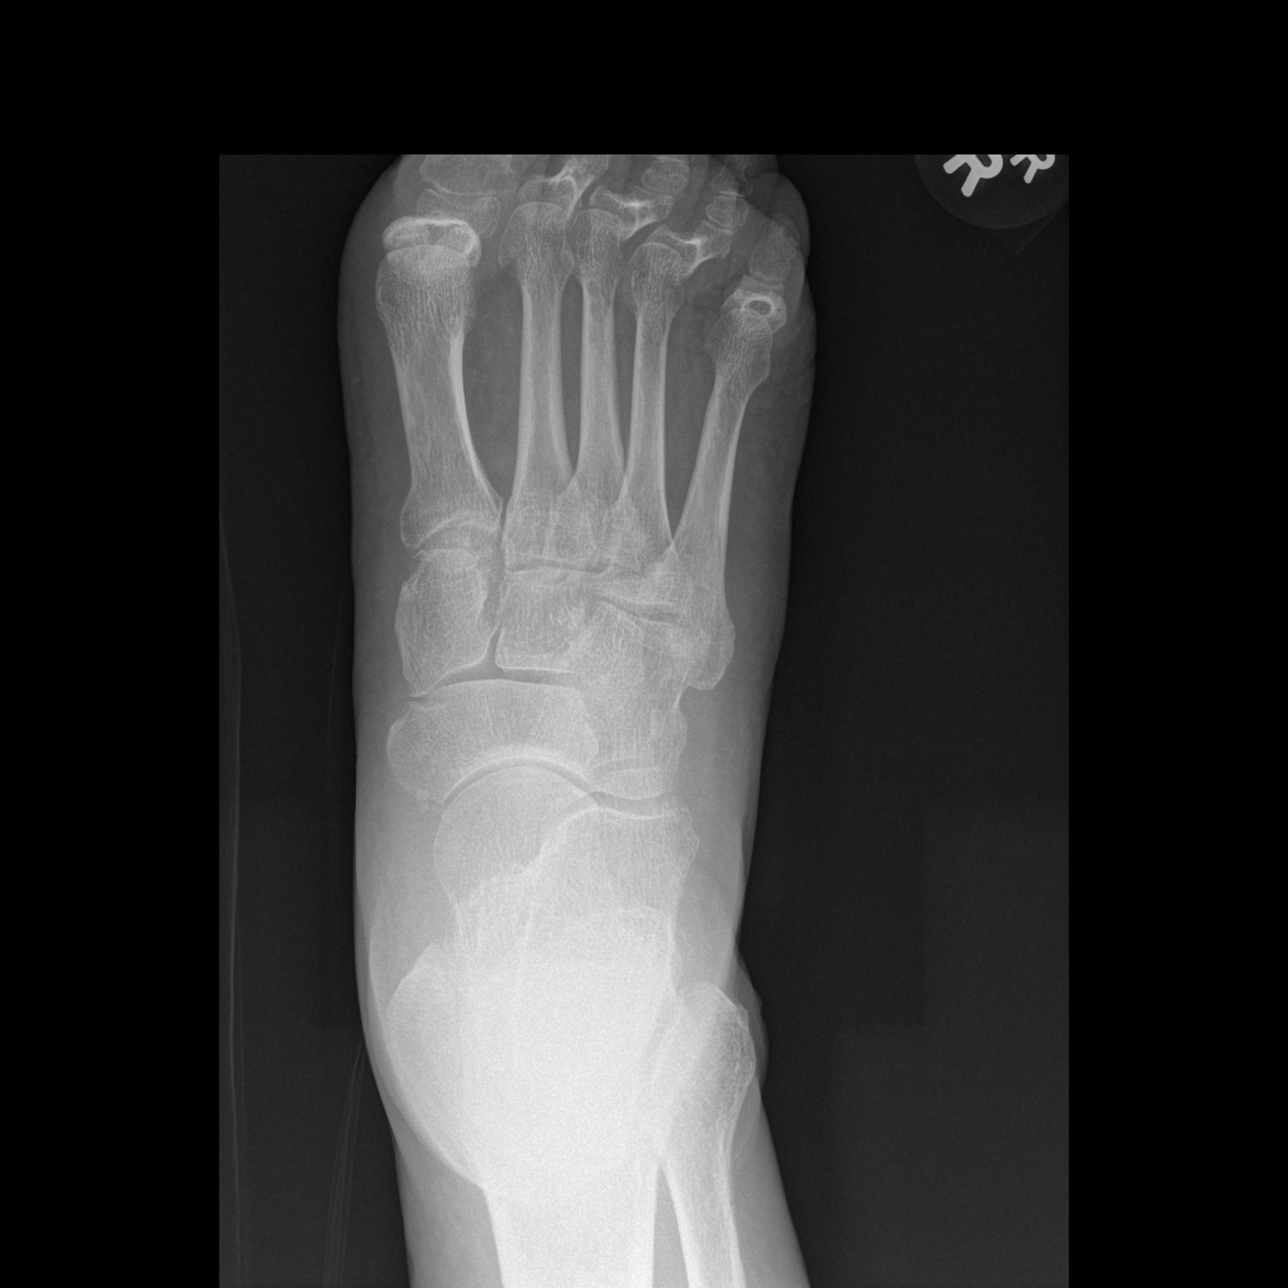

[t foot oblique right]
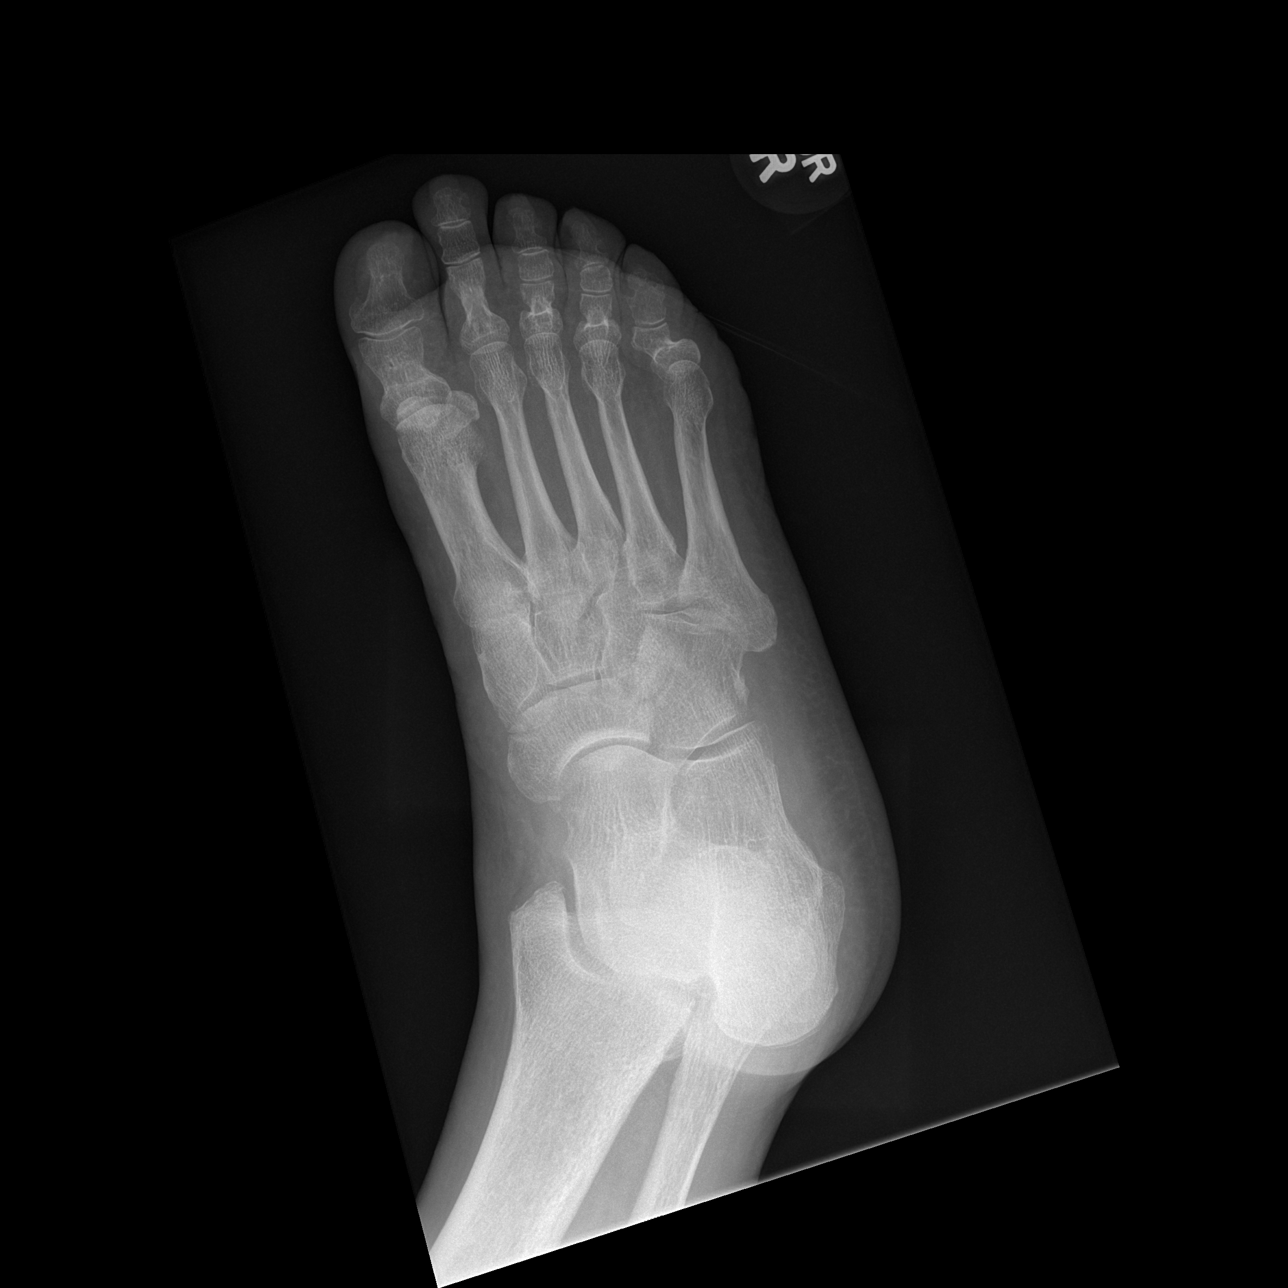

[t foot lat right]
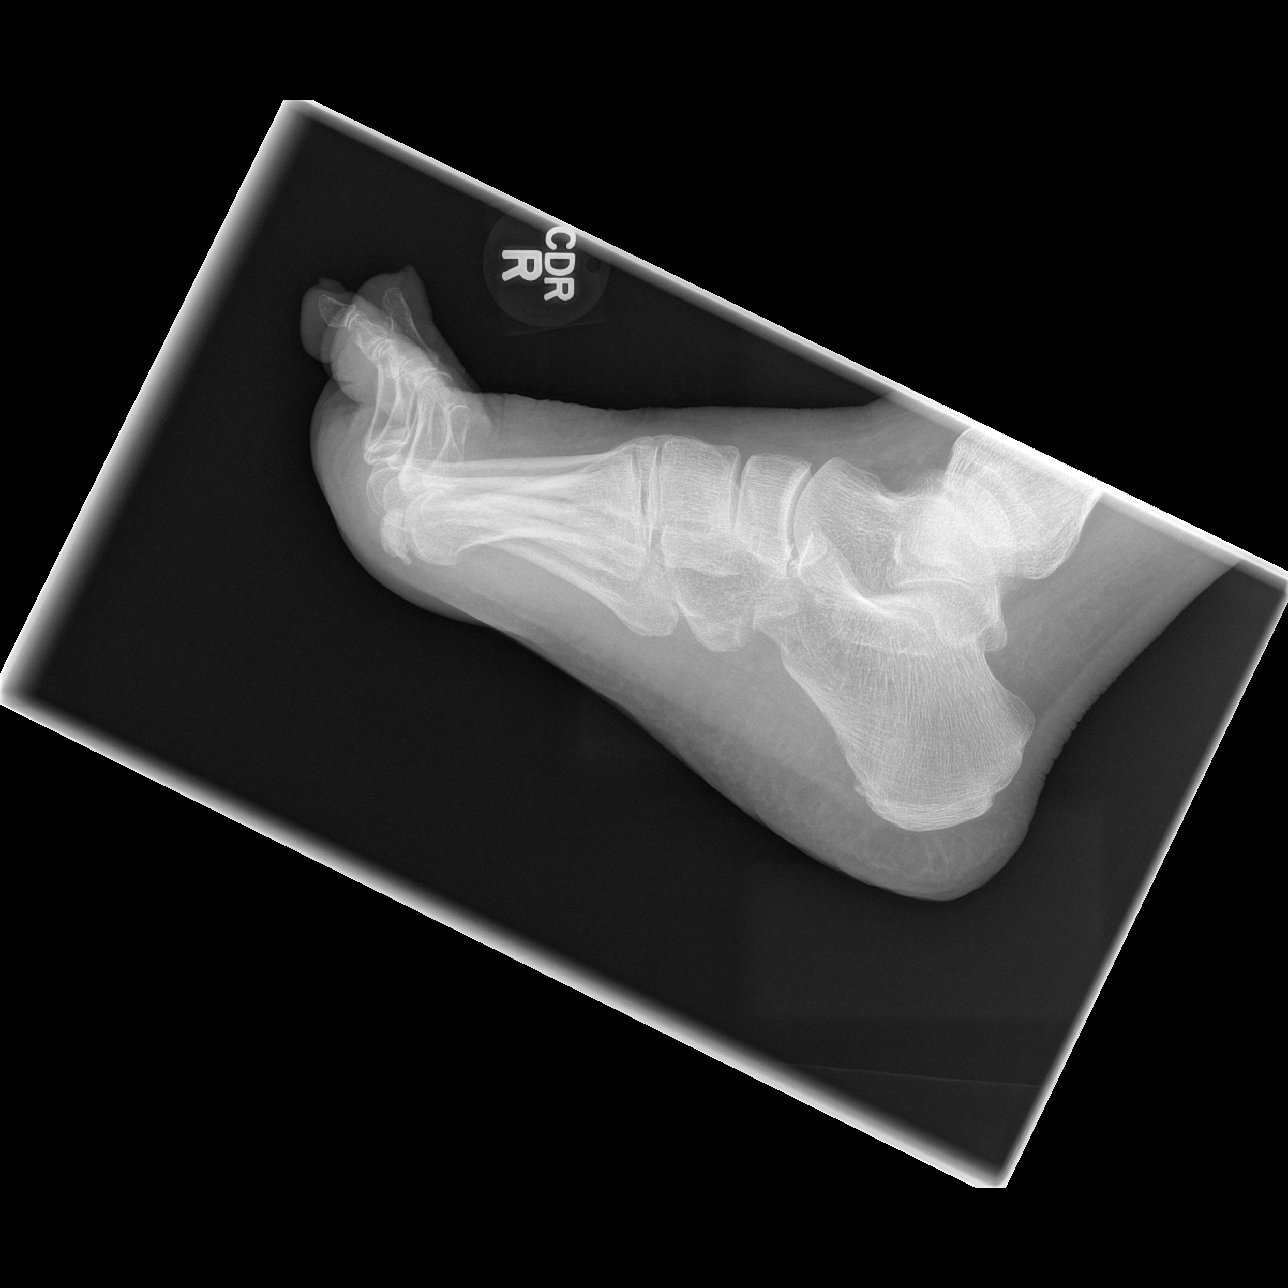

[3 of 3 positions shown; findings below may reference images not displayed]

FINDINGS: Osteopenia. No acute fracture or dislocation. Toe flexion
deformities. Mild degenerative changes of the first MTP. No area of
erosion or osseous destruction. No unexpected radiopaque foreign
body. Soft tissues are unremarkable.
IMPRESSION: 1. No acute findings.
